# Patient Record
Sex: Female | Born: 1955
Health system: Southern US, Community
[De-identification: ages and names within clinical notes are randomized; demographics above are authoritative.]

## PROBLEM LIST (undated history)

## (undated) DIAGNOSIS — N184 Chronic kidney disease, stage 4 (severe): Secondary | ICD-10-CM

## (undated) DIAGNOSIS — I2510 Atherosclerotic heart disease of native coronary artery without angina pectoris: Secondary | ICD-10-CM

## (undated) DIAGNOSIS — I10 Essential (primary) hypertension: Secondary | ICD-10-CM

## (undated) DIAGNOSIS — I5040 Unspecified combined systolic (congestive) and diastolic (congestive) heart failure: Secondary | ICD-10-CM

## (undated) DIAGNOSIS — E119 Type 2 diabetes mellitus without complications: Secondary | ICD-10-CM

## (undated) DIAGNOSIS — E785 Hyperlipidemia, unspecified: Secondary | ICD-10-CM

## (undated) HISTORY — DX: Hyperlipidemia, unspecified: E78.5

## (undated) HISTORY — PX: CARDIAC SURGERY: SHX584

## (undated) HISTORY — PX: CORONARY ARTERY BYPASS GRAFT: SHX141

---

## 2004-09-10 ENCOUNTER — Encounter: Admission: RE | Admit: 2004-09-10 | Discharge: 2004-09-10 | Payer: Self-pay | Admitting: Internal Medicine

## 2004-11-12 ENCOUNTER — Inpatient Hospital Stay (HOSPITAL_COMMUNITY): Admission: AD | Admit: 2004-11-12 | Discharge: 2004-11-18 | Payer: Self-pay | Admitting: *Deleted

## 2017-10-20 ENCOUNTER — Emergency Department (HOSPITAL_COMMUNITY): Payer: 59

## 2017-10-20 ENCOUNTER — Ambulatory Visit (HOSPITAL_COMMUNITY)
Admission: EM | Admit: 2017-10-20 | Discharge: 2017-10-20 | Disposition: A | Discharge status: Home / Self Care | Payer: 59 | Source: Home / Self Care | Attending: Internal Medicine | Admitting: Internal Medicine

## 2017-10-20 ENCOUNTER — Inpatient Hospital Stay (HOSPITAL_COMMUNITY)
Admission: EM | Admit: 2017-10-20 | Discharge: 2017-10-23 | DRG: 872 | Disposition: A | Discharge status: Home / Self Care | Payer: 59 | Attending: Internal Medicine | Admitting: Internal Medicine

## 2017-10-20 ENCOUNTER — Encounter (HOSPITAL_COMMUNITY): Payer: Self-pay | Admitting: *Deleted

## 2017-10-20 ENCOUNTER — Encounter (HOSPITAL_COMMUNITY): Payer: Self-pay | Admitting: Emergency Medicine

## 2017-10-20 DIAGNOSIS — I160 Hypertensive urgency: Secondary | ICD-10-CM | POA: Diagnosis present

## 2017-10-20 DIAGNOSIS — Z951 Presence of aortocoronary bypass graft: Secondary | ICD-10-CM | POA: Diagnosis not present

## 2017-10-20 DIAGNOSIS — N189 Chronic kidney disease, unspecified: Secondary | ICD-10-CM | POA: Diagnosis present

## 2017-10-20 DIAGNOSIS — R748 Abnormal levels of other serum enzymes: Secondary | ICD-10-CM | POA: Diagnosis not present

## 2017-10-20 DIAGNOSIS — R05 Cough: Secondary | ICD-10-CM | POA: Diagnosis not present

## 2017-10-20 DIAGNOSIS — R079 Chest pain, unspecified: Secondary | ICD-10-CM | POA: Diagnosis not present

## 2017-10-20 DIAGNOSIS — E872 Acidosis: Secondary | ICD-10-CM | POA: Diagnosis present

## 2017-10-20 DIAGNOSIS — N390 Urinary tract infection, site not specified: Secondary | ICD-10-CM | POA: Diagnosis not present

## 2017-10-20 DIAGNOSIS — R1031 Right lower quadrant pain: Secondary | ICD-10-CM | POA: Diagnosis not present

## 2017-10-20 DIAGNOSIS — E785 Hyperlipidemia, unspecified: Secondary | ICD-10-CM | POA: Diagnosis present

## 2017-10-20 DIAGNOSIS — N179 Acute kidney failure, unspecified: Secondary | ICD-10-CM | POA: Diagnosis present

## 2017-10-20 DIAGNOSIS — R739 Hyperglycemia, unspecified: Secondary | ICD-10-CM | POA: Diagnosis not present

## 2017-10-20 DIAGNOSIS — I2510 Atherosclerotic heart disease of native coronary artery without angina pectoris: Secondary | ICD-10-CM | POA: Diagnosis present

## 2017-10-20 DIAGNOSIS — R7881 Bacteremia: Secondary | ICD-10-CM

## 2017-10-20 DIAGNOSIS — R7989 Other specified abnormal findings of blood chemistry: Secondary | ICD-10-CM | POA: Diagnosis not present

## 2017-10-20 DIAGNOSIS — R778 Other specified abnormalities of plasma proteins: Secondary | ICD-10-CM | POA: Diagnosis present

## 2017-10-20 DIAGNOSIS — E1165 Type 2 diabetes mellitus with hyperglycemia: Secondary | ICD-10-CM | POA: Diagnosis not present

## 2017-10-20 DIAGNOSIS — E118 Type 2 diabetes mellitus with unspecified complications: Secondary | ICD-10-CM

## 2017-10-20 DIAGNOSIS — E860 Dehydration: Secondary | ICD-10-CM | POA: Diagnosis present

## 2017-10-20 DIAGNOSIS — Z23 Encounter for immunization: Secondary | ICD-10-CM

## 2017-10-20 DIAGNOSIS — Z9119 Patient's noncompliance with other medical treatment and regimen: Secondary | ICD-10-CM | POA: Diagnosis not present

## 2017-10-20 DIAGNOSIS — E119 Type 2 diabetes mellitus without complications: Secondary | ICD-10-CM | POA: Diagnosis not present

## 2017-10-20 DIAGNOSIS — R000 Tachycardia, unspecified: Secondary | ICD-10-CM

## 2017-10-20 DIAGNOSIS — I5031 Acute diastolic (congestive) heart failure: Secondary | ICD-10-CM | POA: Diagnosis not present

## 2017-10-20 DIAGNOSIS — R110 Nausea: Secondary | ICD-10-CM | POA: Diagnosis not present

## 2017-10-20 DIAGNOSIS — Z7982 Long term (current) use of aspirin: Secondary | ICD-10-CM

## 2017-10-20 DIAGNOSIS — Z9114 Patient's other noncompliance with medication regimen: Secondary | ICD-10-CM | POA: Diagnosis not present

## 2017-10-20 DIAGNOSIS — A4159 Other Gram-negative sepsis: Principal | ICD-10-CM | POA: Diagnosis present

## 2017-10-20 DIAGNOSIS — N3000 Acute cystitis without hematuria: Secondary | ICD-10-CM | POA: Diagnosis not present

## 2017-10-20 DIAGNOSIS — I10 Essential (primary) hypertension: Secondary | ICD-10-CM | POA: Diagnosis present

## 2017-10-20 DIAGNOSIS — A419 Sepsis, unspecified organism: Secondary | ICD-10-CM | POA: Diagnosis present

## 2017-10-20 DIAGNOSIS — I248 Other forms of acute ischemic heart disease: Secondary | ICD-10-CM | POA: Diagnosis present

## 2017-10-20 DIAGNOSIS — E1169 Type 2 diabetes mellitus with other specified complication: Secondary | ICD-10-CM

## 2017-10-20 HISTORY — DX: Type 2 diabetes mellitus without complications: E11.9

## 2017-10-20 HISTORY — DX: Essential (primary) hypertension: I10

## 2017-10-20 HISTORY — DX: Atherosclerotic heart disease of native coronary artery without angina pectoris: I25.10

## 2017-10-20 LAB — CBC WITH DIFFERENTIAL/PLATELET
Basophils Absolute: 0 10*3/uL (ref 0.0–0.1)
Basophils Relative: 0 %
Eosinophils Absolute: 0 10*3/uL (ref 0.0–0.7)
Eosinophils Relative: 0 %
HCT: 40.8 % (ref 36.0–46.0)
Hemoglobin: 13.6 g/dL (ref 12.0–15.0)
Lymphocytes Relative: 8 %
Lymphs Abs: 0.9 10*3/uL (ref 0.7–4.0)
MCH: 29.2 pg (ref 26.0–34.0)
MCHC: 33.3 g/dL (ref 30.0–36.0)
MCV: 87.6 fL (ref 78.0–100.0)
Monocytes Absolute: 0.6 10*3/uL (ref 0.1–1.0)
Monocytes Relative: 5 %
Neutro Abs: 10.1 10*3/uL — ABNORMAL HIGH (ref 1.7–7.7)
Neutrophils Relative %: 87 %
Platelets: 104 10*3/uL — ABNORMAL LOW (ref 150–400)
RBC: 4.66 MIL/uL (ref 3.87–5.11)
RDW: 13.1 % (ref 11.5–15.5)
WBC: 11.6 10*3/uL — ABNORMAL HIGH (ref 4.0–10.5)

## 2017-10-20 LAB — COMPREHENSIVE METABOLIC PANEL
ALT: 19 U/L (ref 14–54)
AST: 16 U/L (ref 15–41)
Albumin: 2.6 g/dL — ABNORMAL LOW (ref 3.5–5.0)
Alkaline Phosphatase: 149 U/L — ABNORMAL HIGH (ref 38–126)
Anion gap: 13 (ref 5–15)
BUN: 74 mg/dL — ABNORMAL HIGH (ref 6–20)
CO2: 27 mmol/L (ref 22–32)
Calcium: 8.8 mg/dL — ABNORMAL LOW (ref 8.9–10.3)
Chloride: 91 mmol/L — ABNORMAL LOW (ref 101–111)
Creatinine, Ser: 1.97 mg/dL — ABNORMAL HIGH (ref 0.44–1.00)
GFR calc Af Amer: 30 mL/min — ABNORMAL LOW (ref >60)
GFR calc non Af Amer: 26 mL/min — ABNORMAL LOW (ref >60)
Glucose, Bld: 544 mg/dL (ref 65–99)
Potassium: 5.1 mmol/L (ref 3.5–5.1)
Sodium: 131 mmol/L — ABNORMAL LOW (ref 135–145)
Total Bilirubin: 1.2 mg/dL (ref 0.3–1.2)
Total Protein: 7.1 g/dL (ref 6.5–8.1)

## 2017-10-20 LAB — I-STAT CG4 LACTIC ACID, ED
Lactic Acid, Venous: 2.07 mmol/L (ref 0.5–1.9)
Lactic Acid, Venous: 2.18 mmol/L (ref 0.5–1.9)

## 2017-10-20 LAB — POCT URINALYSIS DIP (DEVICE)
Bilirubin Urine: NEGATIVE
Glucose, UA: 500 mg/dL — AB
Nitrite: NEGATIVE
Protein, ur: 100 mg/dL — AB
Specific Gravity, Urine: 1.015 (ref 1.005–1.030)
Urobilinogen, UA: 0.2 mg/dL (ref 0.0–1.0)
pH: 5 (ref 5.0–8.0)

## 2017-10-20 LAB — CREATININE, URINE, RANDOM: Creatinine, Urine: 52.3 mg/dL

## 2017-10-20 LAB — URINALYSIS, ROUTINE W REFLEX MICROSCOPIC
Bilirubin Urine: NEGATIVE
Glucose, UA: >=500 mg/dL — AB
Ketones, ur: 5 mg/dL — AB
Nitrite: NEGATIVE
Protein, ur: 30 mg/dL — AB
Specific Gravity, Urine: 1.018 (ref 1.005–1.030)
pH: 5 (ref 5.0–8.0)

## 2017-10-20 LAB — POCT I-STAT, CHEM 8
BUN: 61 mg/dL — ABNORMAL HIGH (ref 6–20)
Calcium, Ion: 1.07 mmol/L — ABNORMAL LOW (ref 1.15–1.40)
Chloride: 96 mmol/L — ABNORMAL LOW (ref 101–111)
Creatinine, Ser: 1.9 mg/dL — ABNORMAL HIGH (ref 0.44–1.00)
Glucose, Bld: 535 mg/dL (ref 65–99)
HCT: 44 % (ref 36.0–46.0)
Hemoglobin: 15 g/dL (ref 12.0–15.0)
Potassium: 5.1 mmol/L (ref 3.5–5.1)
Sodium: 133 mmol/L — ABNORMAL LOW (ref 135–145)
TCO2: 28 mmol/L (ref 22–32)

## 2017-10-20 LAB — BRAIN NATRIURETIC PEPTIDE: B Natriuretic Peptide: 143.3 pg/mL — ABNORMAL HIGH (ref 0.0–100.0)

## 2017-10-20 LAB — RAPID URINE DRUG SCREEN, HOSP PERFORMED
AMPHETAMINES: NOT DETECTED
BARBITURATES: NOT DETECTED
BENZODIAZEPINES: NOT DETECTED
Cocaine: NOT DETECTED
Opiates: NOT DETECTED
TETRAHYDROCANNABINOL: NOT DETECTED

## 2017-10-20 LAB — CBG MONITORING, ED
GLUCOSE-CAPILLARY: 421 mg/dL — AB (ref 65–99)
Glucose-Capillary: 378 mg/dL — ABNORMAL HIGH (ref 65–99)

## 2017-10-20 LAB — PROTIME-INR
INR: 0.92
PROTHROMBIN TIME: 12.2 s (ref 11.4–15.2)

## 2017-10-20 LAB — PROCALCITONIN: PROCALCITONIN: 10.86 ng/mL

## 2017-10-20 LAB — LIPASE, BLOOD: Lipase: 33 U/L (ref 11–51)

## 2017-10-20 LAB — I-STAT TROPONIN, ED: Troponin i, poc: 0.09 ng/mL (ref 0.00–0.08)

## 2017-10-20 LAB — TROPONIN I: TROPONIN I: 0.11 ng/mL — AB (ref <0.03)

## 2017-10-20 LAB — LACTIC ACID, PLASMA: Lactic Acid, Venous: 2.1 mmol/L (ref 0.5–1.9)

## 2017-10-20 LAB — SODIUM, URINE, RANDOM: Sodium, Ur: 12 mmol/L

## 2017-10-20 MED ORDER — ZOLPIDEM TARTRATE 5 MG PO TABS: 5.0000 mg | ORAL_TABLET | Freq: Every evening | ORAL | Status: DC | PRN

## 2017-10-20 MED ORDER — ASPIRIN EC 325 MG PO TBEC
325.0000 mg | DELAYED_RELEASE_TABLET | Freq: Every day | ORAL | Status: DC
  Administered 2017-10-20 – 2017-10-23 (×4): 325 mg via ORAL
  Filled 2017-10-20 (×4): qty 1

## 2017-10-20 MED ORDER — ACETAMINOPHEN 650 MG RE SUPP: 650.0000 mg | Freq: Four times a day (QID) | RECTAL | Status: DC | PRN

## 2017-10-20 MED ORDER — DEXTROSE 5 % IV SOLN
2.0000 g | Freq: Once | INTRAVENOUS | Status: AC
  Administered 2017-10-20: 2 g via INTRAVENOUS
  Filled 2017-10-20: qty 2

## 2017-10-20 MED ORDER — INSULIN ASPART 100 UNIT/ML ~~LOC~~ SOLN
7.0000 [IU] | Freq: Once | SUBCUTANEOUS | Status: AC
  Administered 2017-10-20: 7 [IU] via INTRAVENOUS
  Filled 2017-10-20: qty 1

## 2017-10-20 MED ORDER — DEXTROSE 5 % IV SOLN: 1.0000 g | INTRAVENOUS | Status: DC

## 2017-10-20 MED ORDER — MORPHINE SULFATE (PF) 4 MG/ML IV SOLN: 2.0000 mg | INTRAVENOUS | Status: DC | PRN

## 2017-10-20 MED ORDER — INSULIN ASPART 100 UNIT/ML ~~LOC~~ SOLN
0.0000 [IU] | Freq: Three times a day (TID) | SUBCUTANEOUS | Status: DC
  Administered 2017-10-21 (×2): 7 [IU] via SUBCUTANEOUS
  Administered 2017-10-22: 3 [IU] via SUBCUTANEOUS
  Administered 2017-10-22: 7 [IU] via SUBCUTANEOUS
  Administered 2017-10-22: 3 [IU] via SUBCUTANEOUS
  Administered 2017-10-23: 2 [IU] via SUBCUTANEOUS
  Filled 2017-10-20 (×2): qty 1

## 2017-10-20 MED ORDER — ENOXAPARIN SODIUM 40 MG/0.4ML ~~LOC~~ SOLN
40.0000 mg | Freq: Every day | SUBCUTANEOUS | Status: DC
  Administered 2017-10-21 – 2017-10-23 (×3): 40 mg via SUBCUTANEOUS
  Filled 2017-10-20 (×5): qty 0.4

## 2017-10-20 MED ORDER — NITROGLYCERIN 0.4 MG SL SUBL: 0.4000 mg | SUBLINGUAL_TABLET | SUBLINGUAL | Status: DC | PRN

## 2017-10-20 MED ORDER — HYDRALAZINE HCL 20 MG/ML IJ SOLN: 5.0000 mg | INTRAMUSCULAR | Status: DC | PRN

## 2017-10-20 MED ORDER — ACETAMINOPHEN 325 MG PO TABS
650.0000 mg | ORAL_TABLET | Freq: Four times a day (QID) | ORAL | Status: DC | PRN
  Administered 2017-10-22: 650 mg via ORAL
  Filled 2017-10-20: qty 2

## 2017-10-20 MED ORDER — SODIUM CHLORIDE 0.9 % IV BOLUS (SEPSIS)
1000.0000 mL | Freq: Once | INTRAVENOUS | Status: AC
  Administered 2017-10-20: 1000 mL via INTRAVENOUS

## 2017-10-20 MED ORDER — SODIUM CHLORIDE 0.9 % IV BOLUS (SEPSIS)
500.0000 mL | Freq: Once | INTRAVENOUS | Status: AC
  Administered 2017-10-20: 500 mL via INTRAVENOUS

## 2017-10-20 MED ORDER — AMLODIPINE BESYLATE 5 MG PO TABS
5.0000 mg | ORAL_TABLET | Freq: Every day | ORAL | Status: DC
  Administered 2017-10-20 – 2017-10-22 (×3): 5 mg via ORAL
  Filled 2017-10-20 (×3): qty 1

## 2017-10-20 MED ORDER — ATORVASTATIN CALCIUM 40 MG PO TABS
40.0000 mg | ORAL_TABLET | Freq: Every day | ORAL | Status: DC
  Administered 2017-10-20 – 2017-10-22 (×3): 40 mg via ORAL
  Filled 2017-10-20 (×3): qty 1

## 2017-10-20 MED ORDER — INSULIN GLARGINE 100 UNIT/ML ~~LOC~~ SOLN
5.0000 [IU] | Freq: Every day | SUBCUTANEOUS | Status: DC
  Administered 2017-10-20 – 2017-10-21 (×2): 5 [IU] via SUBCUTANEOUS
  Filled 2017-10-20 (×3): qty 0.05

## 2017-10-20 MED ORDER — SODIUM CHLORIDE 0.9 % IV SOLN
INTRAVENOUS | Status: DC
  Administered 2017-10-20 – 2017-10-23 (×3): via INTRAVENOUS

## 2017-10-20 MED ORDER — ONDANSETRON HCL 4 MG/2ML IJ SOLN: 4.0000 mg | Freq: Three times a day (TID) | INTRAMUSCULAR | Status: DC | PRN

## 2017-10-20 NOTE — H&P (Signed)
History and Physical    Beverly Martin HYI:[id number] DOB: [birth year] DOA: 10/20/2017  Referring MD/NP/PA:   PCP: Patient, No Pcp Per   Patient coming from:  The patient is coming from home.  At baseline, pt is independent for most of ADL.   Chief Complaint: Nausea, R flank pain, increased urinary frequency  HPI: Beverly Martin is a 62 y.o. female with medical history significant of hypertension, diabetes mellitus, CAD, s/p of CABG, medication noncompliance, who presents with nausea, right flank pain, increased urinary frequency.  Patient states that she has nausea and poor appetite in the past for several days. She has generalized weakness, but no unilateral weakness, numbness or tingling to extremities. Patient reports increased urinary frequency, but no dysuria or burning on urination. She she had 2 loose stool bowel movements yesterday, which has resolved. Currently patient does not have diarrhea. She has dry heaving, no vomiting. She states that she had mild pain in the right flank and the right abdomen earlier, which has resolved. Currently no abdominal pain. Patient denies chest pain and shortness breath. She states that she had mild dry cough yesterday, which has resolved. No runny nose or sore throat. Denies fever or chills. Patient states that she did not take medications for diabetes and blood pressure in the past 3 years since she lost job and were lack of insurance. She states that she recently got a job and has insurance now.  ED Course: pt was found to have WBC 11.6, lactic acid is 2.18, positive urinalysis with small amount of leukocyte, elevated troponin 0.09, BNP 143, acute renal injury with creatinine 1.97, BUN 74, temperature normal, tachycardia, tachypnea, oxygen saturation 92% on room air, elevated blood pressure 186/89. Chest x-ray showed mild interstitial edema and pleural effusion. Pt is admitted to telemetry bed as inpatient.  Review of Systems:   General: no fevers,  chills, no body weight gain, has poor appetite, has fatigue HEENT: no blurry vision, hearing changes or sore throat Respiratory: no dyspnea, had coughing, no wheezing CV: no chest pain, no palpitations GI: has nausea, dry heaving, currently no abdominal pain, diarrhea, constipation GU: no dysuria, burning on urination, has increased urinary frequency, no hematuria  Ext: no leg edema Neuro: no unilateral weakness, numbness, or tingling, no vision change or hearing loss Skin: no rash, no skin tear. MSK: No muscle spasm, no deformity, no limitation of range of movement in spin Heme: No easy bruising.  Travel history: No recent long distant travel.  Allergy: No Known Allergies  Past Medical History:  Diagnosis Date  . CAD (coronary artery disease)   . Diabetes mellitus without complication ([place])   . Essential hypertension     Past Surgical History:  Procedure Laterality Date  . CARDIAC SURGERY     CABG    Social History:  reports that  has never smoked. she has never used smokeless tobacco. She reports that she does not drink alcohol or use drugs.  Family History:  Family History  Problem Relation Age of Onset  . Diabetes Mellitus II Mother   . Esophageal cancer Father      Prior to Admission medications   Medication Sig Start Date End Date Taking? Authorizing Provider  aspirin EC 81 MG tablet Take 81 mg by mouth daily.   Yes [provider]  DM-Doxylamine-Acetaminophen (NYQUIL COLD & FLU PO) Take 2 capsules by mouth as needed (cold symptoms).    Yes [provider]    Physical Exam: Vitals:  10/20/17 1815 10/20/17 1819 10/20/17 1830 10/20/17 1845  BP: (!) 186/89 (!) 186/89 (!) 173/83 (!) 174/99  Pulse: (!) 106 (!) 106 100 (!) 108  Resp: (!) 26 (!) 23 (!) 21 (!) 23  Temp:  98.6 F (37 C)    TempSrc:  Oral    SpO2: 94% 93% 93% 93%   General: Not in acute distress. Dry mucus and membrane HEENT:       Eyes: PERRL, EOMI, no scleral icterus.        ENT: No discharge from the ears and nose, no pharynx injection, no tonsillar enlargement.        Neck: No JVD, no bruit, no mass felt. Heme: No neck lymph node enlargement. Cardiac: S1/S2, RRR, No murmurs, No gallops or rubs. Respiratory: No rales, wheezing, rhonchi or rubs. GI: Soft, nondistended, nontender, no rebound pain, no organomegaly, BS present. GU: No hematuria Ext: No pitting leg edema bilaterally. 2+DP/PT pulse bilaterally. Musculoskeletal: No joint deformities, No joint redness or warmth, no limitation of ROM in spin. Skin: No rashes.  Neuro: Alert, oriented X3, cranial nerves II-XII grossly intact, moves all extremities normally. Psych: Patient is not psychotic, no suicidal or hemocidal ideation.  Labs on Admission: I have personally reviewed following labs and imaging studies  CBC: Recent Labs  Lab 10/20/17 1135 10/20/17 1301  WBC  --  11.6*  NEUTROABS  --  10.1*  HGB 15.0 13.6  HCT 44.0 40.8  MCV  --  87.6  PLT  --  240*   Basic Metabolic Panel: Recent Labs  Lab 10/20/17 1135 10/20/17 1301  NA 133* 131*  K 5.1 5.1  CL 96* 91*  CO2  --  27  GLUCOSE 535* 544*  BUN 61* 74*  CREATININE 1.90* 1.97*  CALCIUM  --  8.8*   GFR: CrCl cannot be calculated (Unknown ideal weight.). Liver Function Tests: Recent Labs  Lab 10/20/17 1301  AST 16  ALT 19  ALKPHOS 149*  BILITOT 1.2  PROT 7.1  ALBUMIN 2.6*   No results for input(s): LIPASE, AMYLASE in the last 168 hours. No results for input(s): AMMONIA in the last 168 hours. Coagulation Profile: No results for input(s): INR, PROTIME in the last 168 hours. Cardiac Enzymes: No results for input(s): CKTOTAL, CKMB, CKMBINDEX, TROPONINI in the last 168 hours. BNP (last 3 results) No results for input(s): PROBNP in the last 8760 hours. HbA1C: No results for input(s): HGBA1C in the last 72 hours. CBG: No results for input(s): GLUCAP in the last 168 hours. Lipid Profile: No results for input(s): CHOL, HDL,  LDLCALC, TRIG, CHOLHDL, LDLDIRECT in the last 72 hours. Thyroid Function Tests: No results for input(s): TSH, T4TOTAL, FREET4, T3FREE, THYROIDAB in the last 72 hours. Anemia Panel: No results for input(s): VITAMINB12, FOLATE, FERRITIN, TIBC, IRON, RETICCTPCT in the last 72 hours. Urine analysis:    Component Value Date/Time   COLORURINE YELLOW 10/20/2017 1714   APPEARANCEUR CLOUDY (A) 10/20/2017 1714   LABSPEC 1.018 10/20/2017 1714   PHURINE 5.0 10/20/2017 1714   GLUCOSEU >=500 (A) 10/20/2017 1714   HGBUR MODERATE (A) 10/20/2017 1714   BILIRUBINUR NEGATIVE 10/20/2017 1714   KETONESUR 5 (A) 10/20/2017 1714   PROTEINUR 30 (A) 10/20/2017 1714   UROBILINOGEN 0.2 10/20/2017 1115   NITRITE NEGATIVE 10/20/2017 1714   LEUKOCYTESUR MODERATE (A) 10/20/2017 1714   Sepsis Labs: @LABRCNTIP (procalcitonin:4,lacticidven:4) )No results found for this or any previous visit (from the past 240 hour(s)).   Radiological Exams on Admission: Dg Chest 2 View  Result Date: 10/20/2017 CLINICAL DATA:  Fever and cough. EXAM: CHEST  2 VIEW COMPARISON:  2/4/6 FINDINGS: Previous median sternotomy and CABG procedure. Small bilateral pleural effusions and mild interstitial edema identified. No pneumothorax identified. IMPRESSION: 1. Mild pulmonary edema and pleural effusions. 2. No pneumothorax after removal of chest tube. Electronically Signed   By: Kerby Moors M.D.   On: 10/20/2017 13:50     EKG: Independently reviewed.  Sinus rhythm, QTC 392, mild ST depression in inferior leads, mild T-wave inversion in V5 to V6.   Assessment/Plan Principal Problem:   UTI (urinary tract infection) Active Problems:   Essential hypertension   Diabetes mellitus with complication (HCC)   CAD (coronary artery disease)   Sepsis (HCC)   AKI (acute kidney injury) ([place])   Elevated troponin   Sepsis due to UTI (urinary tract infection): Patient meets criteria for sepsis with leukocytosis, tachycardia and tachypnea. Lactic  acid is elevated at 2.18, currently hemodynamically stable. Pt reports R flank and right abdominal pain which has resolved, making pyelonephritis potential differential diagnosis, but pt dose not have CVA tenderness, therefore pyelonephritis is less likely.  -  Admit to telemetry bed as inpt -  Ceftriaxone by IV - Follow up results of urine and blood cx and amend antibiotic regimen if needed per sensitivity results - prn Zofran for nausea - will get Procalcitonin and trend lactic acid levels per sepsis protocol. -  IVF: 1.5L of NS bolus in ED, followed by 100 cc/h   Essential hypertension: not taking meds for 3 years. Bp 186/89 -start amlodipine 5 mg daily now -IV hydralazine when necessary  Diabetes mellitus with cardiovascular complication ([place]):  not taking meds for 3 years. No A1c data available. Blood sugar 544, with normal AG, No DKA. Mental status normal. -start lantus 5 u daily -SSI -f/u A1c  Elevated trop and hx of CAD: s/p of CABG. Trop 0.09. Patient denies any chest pain. Likely due to demand ischemia secondary to sepsis - cycle CE q6 x3 and repeat EKG in the am  - prn Nitroglycerin, Morphine, and aspirin - start lipitor  - Risk factor stratification: will check FLP and A1C  - 2d echo - Inpatient non-urgent card consult order was put in Epic and message to [employer] was sent out.  AKI: Cre 1.97 and BUN 74. No baseline Cre available, assume that pt has AKI. Likely due to prerenal secondary to dehydration. - IVF as above - Check FeNa  - Follow up renal function by BMP  DVT ppx: SQ Lovenox Code Status: Full code Family Communication:  Yes, patient's husband at bed side Disposition Plan:  Anticipate discharge back to previous home environment Consults called:  none Admission status:  Inpatient/tele     Date of Service 10/20/2017    Ivor Costa [facility] Pager [phone]  If 7PM-7AM, please contact night-coverage [email or web address] Password [facility] 10/20/2017, 8:04  PM

## 2017-10-20 NOTE — Discharge Instructions (Signed)
Your blood work showed you blood sugar is very high,  with dehydration, you will need more workup than I can provide here.  Please go to the emergency department for further evaluation and treatment.

## 2017-10-20 NOTE — ED Provider Notes (Signed)
[facility] EMERGENCY DEPARTMENT Provider Note   CSN: [id number] Arrival date & time: 10/20/17  1153     History   Chief Complaint Chief Complaint  Patient presents with  . Hyperglycemia    HPI Beverly Martin is a 62 y.o. female.  HPI   Loss of appetite since Friday, but still drinking a lot of fluids Urinary frequency, no dysuria. Has had right flank pain, mild, comes and goes. Nausea, no vomiting No diarrhea or constipation. Slight lower abdominal pain Had slight fever at urgent care today  No chest pain, very mild dyspnea at first but didn't linger, none today Mild cough today Was a Diabetic and when lost job, lost medication, has been off of medication for years now Has hx of CABG in 2006  Does note orthopnea, PND also since Friday, some doe    Past Medical History:  Diagnosis Date  . CAD (coronary artery disease)   . Diabetes mellitus without complication ([place])   . Essential hypertension     Patient Active Problem List   Diagnosis Date Noted  . UTI (urinary tract infection) 10/20/2017  . Sepsis ([place]) 10/20/2017  . AKI (acute kidney injury) ([place]) 10/20/2017  . Elevated troponin 10/20/2017  . Essential hypertension   . Diabetes mellitus with complication ([place])   . CAD (coronary artery disease)     Past Surgical History:  Procedure Laterality Date  . CARDIAC SURGERY     CABG    OB History    No data available       Home Medications    Prior to Admission medications   Medication Sig Start Date End Date Taking? Authorizing Provider  aspirin EC 81 MG tablet Take 81 mg by mouth daily.   Yes [provider]  DM-Doxylamine-Acetaminophen (NYQUIL COLD & FLU PO) Take 2 capsules by mouth as needed (cold symptoms).    Yes [provider]    Family History Family History  Problem Relation Age of Onset  . Diabetes Mellitus II Mother   . Esophageal cancer Father     Social History Social History   Tobacco Use    . Smoking status: Never Smoker  . Smokeless tobacco: Never Used  Substance Use Topics  . Alcohol use: No    Frequency: Never  . Drug use: No     Allergies   Patient has no known allergies.   Review of Systems Review of Systems  Constitutional: Positive for appetite change and fatigue. Negative for fever.  HENT: Negative for sore throat.   Eyes: Negative for visual disturbance.  Respiratory: Negative for cough and shortness of breath (not today).   Cardiovascular: Positive for leg swelling. Negative for chest pain.  Gastrointestinal: Positive for abdominal pain and nausea. Negative for constipation, diarrhea and vomiting.  Endocrine: Positive for polyuria.  Genitourinary: Positive for flank pain and frequency. Negative for difficulty urinating.  Musculoskeletal: Negative for back pain and neck pain.  Skin: Negative for rash.  Neurological: Negative for syncope and headaches.     Physical Exam Updated Vital Signs BP 140/67   Pulse (!) 111   Temp 98.6 F (37 C) (Oral)   Resp 20   SpO2 95%   Physical Exam  Constitutional: She is oriented to person, place, and time. She appears well-developed and well-nourished. No distress.  HENT:  Head: Normocephalic and atraumatic.  Eyes: Conjunctivae and EOM are normal.  Neck: Normal range of motion. No JVD present.  Cardiovascular: Normal rate, regular rhythm, normal  heart sounds and intact distal pulses. Exam reveals no gallop and no friction rub.  No murmur heard. Pulmonary/Chest: Effort normal and breath sounds normal. No respiratory distress. She has no wheezes. She has no rales.  Abdominal: Soft. She exhibits no distension. There is no tenderness. There is no guarding.  Musculoskeletal: She exhibits no edema or tenderness.  Neurological: She is alert and oriented to person, place, and time.  Skin: Skin is warm and dry. No rash noted. She is not diaphoretic. No erythema.  Nursing note and vitals reviewed.    ED Treatments  / Results  Labs (all labs ordered are listed, but only abnormal results are displayed) Labs Reviewed  COMPREHENSIVE METABOLIC PANEL - Abnormal; Notable for the following components:      Result Value   Sodium 131 (*)    Chloride 91 (*)    Glucose, Bld 544 (*)    BUN 74 (*)    Creatinine, Ser 1.97 (*)    Calcium 8.8 (*)    Albumin 2.6 (*)    Alkaline Phosphatase 149 (*)    GFR calc non Af Amer 26 (*)    GFR calc Af Amer 30 (*)    All other components within normal limits  CBC WITH DIFFERENTIAL/PLATELET - Abnormal; Notable for the following components:   WBC 11.6 (*)    Platelets 104 (*)    Neutro Abs 10.1 (*)    All other components within normal limits  URINALYSIS, ROUTINE W REFLEX MICROSCOPIC - Abnormal; Notable for the following components:   APPearance CLOUDY (*)    Glucose, UA >=500 (*)    Hgb urine dipstick MODERATE (*)    Ketones, ur 5 (*)    Protein, ur 30 (*)    Leukocytes, UA MODERATE (*)    Bacteria, UA MANY (*)    Squamous Epithelial / LPF 0-5 (*)    All other components within normal limits  BRAIN NATRIURETIC PEPTIDE - Abnormal; Notable for the following components:   B Natriuretic Peptide 143.3 (*)    All other components within normal limits  TROPONIN I - Abnormal; Notable for the following components:   Troponin I 0.11 (*)    All other components within normal limits  LACTIC ACID, PLASMA - Abnormal; Notable for the following components:   Lactic Acid, Venous 2.1 (*)    All other components within normal limits  I-STAT CG4 LACTIC ACID, ED - Abnormal; Notable for the following components:   Lactic Acid, Venous 2.07 (*)    All other components within normal limits  I-STAT CG4 LACTIC ACID, ED - Abnormal; Notable for the following components:   Lactic Acid, Venous 2.18 (*)    All other components within normal limits  I-STAT TROPONIN, ED - Abnormal; Notable for the following components:   Troponin i, poc 0.09 (*)    All other components within normal limits   CBG MONITORING, ED - Abnormal; Notable for the following components:   Glucose-Capillary 421 (*)    All other components within normal limits  CBG MONITORING, ED - Abnormal; Notable for the following components:   Glucose-Capillary 378 (*)    All other components within normal limits  CULTURE, BLOOD (ROUTINE X 2)  CULTURE, BLOOD (ROUTINE X 2)  URINE CULTURE  LIPASE, BLOOD  LACTIC ACID, PLASMA  PROCALCITONIN  PROTIME-INR  CREATININE, URINE, RANDOM  SODIUM, URINE, RANDOM  RAPID URINE DRUG SCREEN, HOSP PERFORMED  HEMOGLOBIN A1C  LIPID PANEL  TROPONIN I  TROPONIN I  HIV ANTIBODY (  ROUTINE TESTING)  BASIC METABOLIC PANEL  CBC    EKG  EKG Interpretation  Date/Time:  Tuesday October 20 2017 18:17:04 EST Ventricular Rate:  105 PR Interval:    QRS Duration: 80 QT Interval:  296 QTC Calculation: 392 R Axis:   75 Text Interpretation:  Sinus tachycardia LAE, consider biatrial enlargement Probable LVH with secondary repol abnrm Since prior ECG, diffuse repolarization abnormality is new, rate has increased Confirmed by Gareth Morgan [phone]) on 10/21/2017 1:42:36 AM       Radiology Dg Chest 2 View  Result Date: 10/20/2017 CLINICAL DATA:  Fever and cough. EXAM: CHEST  2 VIEW COMPARISON:  2/4/6 FINDINGS: Previous median sternotomy and CABG procedure. Small bilateral pleural effusions and mild interstitial edema identified. No pneumothorax identified. IMPRESSION: 1. Mild pulmonary edema and pleural effusions. 2. No pneumothorax after removal of chest tube. Electronically Signed   By: Kerby Moors M.D.   On: 10/20/2017 13:50    Procedures Procedures (including critical care time)  Medications Ordered in ED Medications  aspirin EC tablet 325 mg (325 mg Oral Given 10/20/17 2227)  atorvastatin (LIPITOR) tablet 40 mg (40 mg Oral Given 10/20/17 2335)  nitroGLYCERIN (NITROSTAT) SL tablet 0.4 mg (not administered)  morphine 4 MG/ML injection 2 mg (not administered)  cefTRIAXone (ROCEPHIN)  1 g in dextrose 5 % 50 mL IVPB (not administered)  amLODipine (NORVASC) tablet 5 mg (5 mg Oral Given 10/20/17 2227)  hydrALAZINE (APRESOLINE) injection 5 mg (not administered)  ondansetron (ZOFRAN) injection 4 mg (not administered)  0.9 %  sodium chloride infusion ( Intravenous New Bag/Given 10/20/17 2051)  insulin glargine (LANTUS) injection 5 Units (5 Units Subcutaneous Given 10/20/17 2140)  insulin aspart (novoLOG) injection 0-9 Units (not administered)  enoxaparin (LOVENOX) injection 40 mg (not administered)  acetaminophen (TYLENOL) tablet 650 mg (not administered)    Or  acetaminophen (TYLENOL) suppository 650 mg (not administered)  zolpidem (AMBIEN) tablet 5 mg (not administered)  insulin aspart (novoLOG) injection 7 Units (7 Units Intravenous Given 10/20/17 1653)  cefTRIAXone (ROCEPHIN) 2 g in dextrose 5 % 50 mL IVPB (0 g Intravenous Stopped 10/20/17 2141)  sodium chloride 0.9 % bolus 500 mL (0 mLs Intravenous Stopped 10/20/17 2141)  sodium chloride 0.9 % bolus 1,000 mL (0 mLs Intravenous Stopped 10/20/17 2222)     Initial Impression / Assessment and Plan / ED Course  I have reviewed the triage vital signs and the nursing notes.  Pertinent labs & imaging results that were available during my care of the patient were reviewed by me and considered in my medical decision making (see chart for details).     62 year old female with a history of CABG, diabetes not on medications, presents with concern for nausea, right-sided flank and suprapubic pain, polyuria, increased thirst, and fatigue.  Patient also noted orthopnea, paroxysmal nocturnal dyspnea.  Labs are significant for hyperglycemia without signs of DKA.  Chest x-ray concerning for mild pulmonary edema and pleural effusions.  Labs are also significant for elevated creatinine, with unknown baseline, slightly elevated lactic acid, mild leukocytosis, and urinalysis concerning for urinary tract infection.  Overall, suspect UTI as etiology of  symptoms, combined with uncontrolled hyperglycemia.  Suspect she may have some underlying cardiomyopathy given chest x-ray findings.  Troponin with mild elevation, likely secondary to strain in setting of acute illness, or possible congestive heart failure.  Overall, she appears intravascularly dry on exam secondary to hyperglycemia.  Given 500 cc of normal saline and insulin.  Blood cultures done and Rocephin given  for concern of urinary tract infection.  Will admit for continued care.  Final Clinical Impressions(s) / ED Diagnoses   Final diagnoses:  Hyperglycemia  Urinary tract infection without hematuria, site unspecified  Elevated troponin    ED Discharge Orders    None       Gareth Morgan, MD 10/21/17 [phone]

## 2017-10-20 NOTE — ED Provider Notes (Signed)
Beverly Martin    CSN: [id number] Arrival date & time: 10/20/17  1002     History   Chief Complaint Chief Complaint  Patient presents with  . Abdominal Pain    HPI Beverly Martin is a 62 y.o. female.   62 year old female with history of diabetes comes in for 5-day history of abdominal pain, nausea with dry heaving. Diarrhea started yesterday, about 2 episodes, denies blood in stool. Denies vomiting. States subjective fever. RLQ pain, pounding pain that is intermittent. States laying on the side makes the symptoms worse, states theraflu help with the symptoms. Endorses urinary frequency, urgency, denies dysuria, hematuria. Denies vaginal symptoms such as discharge, itching, spotting. Menopause at 62 years old.   History of DM, history of taking medications, but stopped more than 5 years ago after losing her job.       Past Medical History:  Diagnosis Date  . CAD (coronary artery disease)   . Diabetes mellitus without complication ([facility])   . Essential hypertension     Patient Active Problem List   Diagnosis Date Noted  . UTI (urinary tract infection) 10/20/2017  . Sepsis ([facility]) 10/20/2017  . AKI (acute kidney injury) ([place]) 10/20/2017  . Elevated troponin 10/20/2017  . Essential hypertension   . Diabetes mellitus with complication ([place])   . CAD (coronary artery disease)     Past Surgical History:  Procedure Laterality Date  . CARDIAC SURGERY     CABG    OB History    No data available       Home Medications    Prior to Admission medications   Medication Sig Start Date End Date Taking? Authorizing Provider  aspirin EC 81 MG tablet Take 81 mg by mouth daily.    [provider]  DM-Doxylamine-Acetaminophen (NYQUIL COLD & FLU PO) Take 2 capsules by mouth as needed (cold symptoms).     [provider]    Family History Family History  Problem Relation Age of Onset  . Diabetes Mellitus II Mother   . Esophageal cancer Father      Social History Social History   Tobacco Use  . Smoking status: Never Smoker  . Smokeless tobacco: Never Used  Substance Use Topics  . Alcohol use: No    Frequency: Never  . Drug use: No     Allergies   Patient has no known allergies.   Review of Systems Review of Systems  Reason unable to perform ROS: See HPI as above.     Physical Exam Triage Vital Signs ED Triage Vitals  Enc Vitals Group     BP 10/20/17 1032 (!) 160/85     Pulse Rate 10/20/17 1032 (!) 120     Resp 10/20/17 1032 16     Temp 10/20/17 1032 98.3 F (36.8 C)     Temp Source 10/20/17 1032 Oral     SpO2 10/20/17 1032 100 %     Weight 10/20/17 1035 155 lb (70.3 kg)     Height --      Head Circumference --      Peak Flow --      Pain Score 10/20/17 1035 4     Pain Loc --      Pain Edu? --      Excl. in [place]? --    Orthostatic VS for the past 24 hrs:  BP- Lying Pulse- Lying BP- Sitting Pulse- Sitting BP- Standing at 0 minutes Pulse- Standing at 0 minutes  10/20/17 1120 (!)  161/93 113 151/78 113 129/69 123    Updated Vital Signs BP (!) 160/85 (BP Location: Left Arm)   Pulse (!) 120   Temp 98.3 F (36.8 C) (Oral)   Resp 16   Wt 155 lb (70.3 kg)   SpO2 100%   Physical Exam  Constitutional: She is oriented to person, place, and time. She appears well-developed and well-nourished. No distress.  HENT:  Head: Normocephalic and atraumatic.  Eyes: Conjunctivae are normal. Pupils are equal, round, and reactive to light.  Cardiovascular: Regular rhythm and normal heart sounds. Tachycardia present. Exam reveals no gallop and no friction rub.  No murmur heard. Pulmonary/Chest: Effort normal and breath sounds normal. She has no wheezes. She has no rales.  Abdominal: Soft. Bowel sounds are normal. She exhibits no mass. There is tenderness (mild RLQ pain without guarding or rebound). There is CVA tenderness (right). There is no rebound and no guarding.  Negative psoas, obturator, Rovsing sign.   Neurological: She is alert and oriented to person, place, and time.  Skin: Skin is warm and dry.  Psychiatric: She has a normal mood and affect. Her behavior is normal. Judgment normal.     UC Treatments / Results  Labs (all labs ordered are listed, but only abnormal results are displayed) Labs Reviewed  POCT URINALYSIS DIP (DEVICE) - Abnormal; Notable for the following components:      Result Value   Glucose, UA 500 (*)    Ketones, ur TRACE (*)    Hgb urine dipstick MODERATE (*)    Protein, ur 100 (*)    Leukocytes, UA TRACE (*)    All other components within normal limits  POCT I-STAT, CHEM 8 - Abnormal; Notable for the following components:   Sodium 133 (*)    Chloride 96 (*)    BUN 61 (*)    Creatinine, Ser 1.90 (*)    Glucose, Bld 535 (*)    Calcium, Ion 1.07 (*)    All other components within normal limits    EKG  EKG Interpretation None       Radiology Dg Chest 2 View  Result Date: 10/20/2017 CLINICAL DATA:  Fever and cough. EXAM: CHEST  2 VIEW COMPARISON:  2/4/6 FINDINGS: Previous median sternotomy and CABG procedure. Small bilateral pleural effusions and mild interstitial edema identified. No pneumothorax identified. IMPRESSION: 1. Mild pulmonary edema and pleural effusions. 2. No pneumothorax after removal of chest tube. Electronically Signed   By: Kerby Moors M.D.   On: 10/20/2017 13:50    Procedures Procedures (including critical care time)  Medications Ordered in UC Medications - No data to display   Initial Impression / Assessment and Plan / UC Course  I have reviewed the triage vital signs and the nursing notes.  Pertinent labs & imaging results that were available during my care of the patient were reviewed by me and considered in my medical decision making (see chart for details).    62 year old female with history of diabetes for 3-4-day history of right lower quadrant abdominal pain, nausea.  Patient has not been on diabetes medicine for at  least the past 5 years.  She was found to be tachycardic, have right CVA tenderness, right lower quadrant pain on exam without guarding or rebound.  Positive orthostatics.  I-STAT showed serum glucose at 535, creatinine of 1.9.  Urine showed moderate hemoglobin with trace leukocytes.  Given history and exam, discussed with patient will need further evaluation and treatment.  Patient discharged in stable condition  to the emergency department for further evaluation.  Patient expresses understanding and agrees to plan.   Final Clinical Impressions(s) / UC Diagnoses   Final diagnoses:  Hyperglycemia  Right lower quadrant abdominal pain  Tachycardia    ED Discharge Orders    None         Arturo Morton 10/20/17 2051

## 2017-10-20 NOTE — ED Notes (Signed)
Gave pt water 

## 2017-10-20 NOTE — ED Notes (Signed)
Admitting MD aware Trop 0.11 and Lactic 2.1

## 2017-10-20 NOTE — ED Triage Notes (Signed)
Pt also reports recent fever and cough

## 2017-10-20 NOTE — ED Triage Notes (Signed)
PT reports abdominal pain, nausea, and loose stools. PT reports dry heaving as well. Symptoms started Friday.

## 2017-10-20 NOTE — ED Notes (Signed)
Informed Chrissie Noa - RN of pt's HR.

## 2017-10-20 NOTE — ED Triage Notes (Signed)
Pt sent from urgent are for hyperglycemia, history of diabetes and has been off medication for several years, went to urgent are c/o generalized fatigue, alert and oriented no distress noted

## 2017-10-21 ENCOUNTER — Encounter (HOSPITAL_COMMUNITY): Payer: Self-pay | Admitting: General Practice

## 2017-10-21 ENCOUNTER — Other Ambulatory Visit: Payer: Self-pay

## 2017-10-21 ENCOUNTER — Ambulatory Visit (HOSPITAL_COMMUNITY): Payer: 59

## 2017-10-21 DIAGNOSIS — R748 Abnormal levels of other serum enzymes: Secondary | ICD-10-CM

## 2017-10-21 DIAGNOSIS — I10 Essential (primary) hypertension: Secondary | ICD-10-CM

## 2017-10-21 DIAGNOSIS — N3000 Acute cystitis without hematuria: Secondary | ICD-10-CM

## 2017-10-21 DIAGNOSIS — R079 Chest pain, unspecified: Secondary | ICD-10-CM

## 2017-10-21 DIAGNOSIS — I2510 Atherosclerotic heart disease of native coronary artery without angina pectoris: Secondary | ICD-10-CM

## 2017-10-21 DIAGNOSIS — R7881 Bacteremia: Secondary | ICD-10-CM

## 2017-10-21 DIAGNOSIS — I5031 Acute diastolic (congestive) heart failure: Secondary | ICD-10-CM

## 2017-10-21 LAB — ECHOCARDIOGRAM COMPLETE
AVLVOTPG: 5 mmHg
CHL CUP RV SYS PRESS: 25 mmHg
CHL CUP TV REG PEAK VELOCITY: 232 cm/s
EWDT: 214 ms
FS: 39 % (ref 28–44)
IVS/LV PW RATIO, ED: 0.92
LA diam index: 1.73 cm/m2
LA vol A4C: 32.6 ml
LA vol index: 20.9 mL/m2
LASIZE: 31 mm
LAVOL: 37.5 mL
LDCA: 2.54 cm2
LEFT ATRIUM END SYS DIAM: 31 mm
LV PW d: 12 mm — AB (ref 0.6–1.1)
LV TDI E'MEDIAL: 5.33
LV e' LATERAL: 6.96 cm/s
LVOT SV: 55 mL
LVOT VTI: 21.7 cm
LVOT diameter: 18 mm
LVOT peak vel: 109 cm/s
Lateral S' vel: 10.1 cm/s
MV Dec: 214
MVPKEVEL: 0.9 m/s
TAPSE: 16.1 mm
TDI e' lateral: 6.96
TR max vel: 232 cm/s
Weight: 2480 oz

## 2017-10-21 LAB — BLOOD CULTURE ID PANEL (REFLEXED)
Acinetobacter baumannii: NOT DETECTED
CARBAPENEM RESISTANCE: NOT DETECTED
Candida albicans: NOT DETECTED
Candida glabrata: NOT DETECTED
Candida krusei: NOT DETECTED
Candida parapsilosis: NOT DETECTED
Candida tropicalis: NOT DETECTED
ENTEROBACTERIACEAE SPECIES: DETECTED — AB
ENTEROCOCCUS SPECIES: NOT DETECTED
Enterobacter cloacae complex: NOT DETECTED
Escherichia coli: NOT DETECTED
HAEMOPHILUS INFLUENZAE: NOT DETECTED
KLEBSIELLA OXYTOCA: NOT DETECTED
Klebsiella pneumoniae: DETECTED — AB
LISTERIA MONOCYTOGENES: NOT DETECTED
NEISSERIA MENINGITIDIS: NOT DETECTED
PSEUDOMONAS AERUGINOSA: NOT DETECTED
Proteus species: NOT DETECTED
STAPHYLOCOCCUS AUREUS BCID: NOT DETECTED
STREPTOCOCCUS AGALACTIAE: NOT DETECTED
STREPTOCOCCUS PNEUMONIAE: NOT DETECTED
STREPTOCOCCUS PYOGENES: NOT DETECTED
STREPTOCOCCUS SPECIES: NOT DETECTED
Serratia marcescens: NOT DETECTED
Staphylococcus species: NOT DETECTED

## 2017-10-21 LAB — BASIC METABOLIC PANEL
Anion gap: 11 (ref 5–15)
BUN: 63 mg/dL — ABNORMAL HIGH (ref 6–20)
CHLORIDE: 100 mmol/L — AB (ref 101–111)
CO2: 25 mmol/L (ref 22–32)
Calcium: 8 mg/dL — ABNORMAL LOW (ref 8.9–10.3)
Creatinine, Ser: 1.44 mg/dL — ABNORMAL HIGH (ref 0.44–1.00)
GFR calc non Af Amer: 38 mL/min — ABNORMAL LOW (ref >60)
GFR, EST AFRICAN AMERICAN: 44 mL/min — AB (ref >60)
Glucose, Bld: 330 mg/dL — ABNORMAL HIGH (ref 65–99)
POTASSIUM: 4 mmol/L (ref 3.5–5.1)
SODIUM: 136 mmol/L (ref 135–145)

## 2017-10-21 LAB — CBC
HEMATOCRIT: 34.6 % — AB (ref 36.0–46.0)
Hemoglobin: 11.6 g/dL — ABNORMAL LOW (ref 12.0–15.0)
MCH: 29.1 pg (ref 26.0–34.0)
MCHC: 33.5 g/dL (ref 30.0–36.0)
MCV: 86.9 fL (ref 78.0–100.0)
Platelets: 86 10*3/uL — ABNORMAL LOW (ref 150–400)
RBC: 3.98 MIL/uL (ref 3.87–5.11)
RDW: 13.3 % (ref 11.5–15.5)
WBC: 12.2 10*3/uL — AB (ref 4.0–10.5)

## 2017-10-21 LAB — HEMOGLOBIN A1C
HEMOGLOBIN A1C: 11.6 % — AB (ref 4.8–5.6)
Mean Plasma Glucose: 286.22 mg/dL

## 2017-10-21 LAB — GLUCOSE, CAPILLARY
Glucose-Capillary: 262 mg/dL — ABNORMAL HIGH (ref 65–99)
Glucose-Capillary: 261 mg/dL — ABNORMAL HIGH (ref 65–99)

## 2017-10-21 LAB — TROPONIN I
Troponin I: 0.08 ng/mL (ref <0.03)
Troponin I: 0.09 ng/mL (ref <0.03)

## 2017-10-21 LAB — LIPID PANEL
Cholesterol: 238 mg/dL — ABNORMAL HIGH (ref 0–200)
LDL CALC: UNDETERMINED mg/dL (ref 0–99)
TRIGLYCERIDES: 630 mg/dL — AB (ref <150)
VLDL: UNDETERMINED mg/dL (ref 0–40)

## 2017-10-21 LAB — CBG MONITORING, ED
Glucose-Capillary: 317 mg/dL — ABNORMAL HIGH (ref 65–99)
Glucose-Capillary: 334 mg/dL — ABNORMAL HIGH (ref 65–99)

## 2017-10-21 LAB — LACTIC ACID, PLASMA: Lactic Acid, Venous: 1.9 mmol/L (ref 0.5–1.9)

## 2017-10-21 LAB — HIV ANTIBODY (ROUTINE TESTING W REFLEX): HIV Screen 4th Generation wRfx: NONREACTIVE

## 2017-10-21 MED ORDER — CARVEDILOL 6.25 MG PO TABS
6.2500 mg | ORAL_TABLET | Freq: Two times a day (BID) | ORAL | Status: DC
  Administered 2017-10-21 – 2017-10-23 (×5): 6.25 mg via ORAL
  Filled 2017-10-21 (×5): qty 1

## 2017-10-21 MED ORDER — LIVING WELL WITH DIABETES BOOK
Freq: Once | Status: DC
  Filled 2017-10-21: qty 1

## 2017-10-21 MED ORDER — INSULIN STARTER KIT- PEN NEEDLES (ENGLISH)
1.0000 | Freq: Once | Status: DC
  Filled 2017-10-21: qty 1

## 2017-10-21 MED ORDER — INFLUENZA VAC SPLIT QUAD 0.5 ML IM SUSY
0.5000 mL | PREFILLED_SYRINGE | INTRAMUSCULAR | Status: AC
  Administered 2017-10-22: 0.5 mL via INTRAMUSCULAR
  Filled 2017-10-21: qty 0.5

## 2017-10-21 MED ORDER — INSULIN GLARGINE 100 UNIT/ML ~~LOC~~ SOLN
15.0000 [IU] | Freq: Every day | SUBCUTANEOUS | Status: DC
  Administered 2017-10-21: 15 [IU] via SUBCUTANEOUS
  Filled 2017-10-21 (×2): qty 0.15

## 2017-10-21 MED ORDER — DEXTROSE 5 % IV SOLN
2.0000 g | INTRAVENOUS | Status: DC
  Administered 2017-10-21 – 2017-10-22 (×2): 2 g via INTRAVENOUS
  Filled 2017-10-21 (×3): qty 2

## 2017-10-21 NOTE — Progress Notes (Addendum)
PROGRESS NOTE    Beverly Martin  FXT:[id number] DOB: [birth year] DOA: 10/20/2017 PCP: Patient, No Pcp Per   Brief Narrative: Patient is a 62 year old female with past medical history of hypertension, diabetes mellitus, coronary artery disease, status post CABG, medical noncompliance who presented to the emergency department with nausea, right flank pain and increased urinary frequency. Patient has been currently treated for urinary tract infection and possible CHF.  Patient also had mild elevation of troponin on presentation.  Cardiology consulted.  Assessment & Plan:   Principal Problem:   UTI (urinary tract infection) Active Problems:   Essential hypertension   Type 2 diabetes mellitus with hyperlipidemia (H[facility])   CAD (coronary artery disease)   Sepsis ([facility])   AKI (acute kidney injury) ([place])   Elevated troponin   Sepsis due to urinary tract infection: Presented with leukocytosis and lactic acidosis.  Lactic acidosis has resolved.  Patient is hemodynamically stable.  Continue IV fluids.  Start on ceftriaxone. We will follow-up blood culture and urine culture.  Uncontrolled diabetes mellitus: Not taking medications for 3 years.  Hemoglobin A1c more than 11.  Blood sugar 544 on presentation. Started on Lantus and sliding scale insulin. Likely patient should be discharged on insulin along with oral medication. Diabetic education will be provided.  Patient will be taught on insulin use Patient should follow-up closely with PCP as an outpatient.  History of coronary artery disease/elevated troponin: Elevated troponin of 0.09 on presentation.  She is status post CABG.  Currently denies any chest pain.   Cardiology has been consulted.  Not plan for any intervention.  Echocardiogram has been ordered. Lipitor is started.  Hypertension: On amlodipine and carvedilol.  Still mildly hypertensive.  We will continue to monitor blood pressure.  Could not he start on ACE inhibitors/ARB due to acute  kidney injury.  Acute kidney injury: Started on IV fluids.  Will follow up BMP test today.  Positive blood culture: Microbiology informed that patient's blood cultures growing Klebsiella pneumonia.  We will follow the final blood culture report and susceptibility.  We will continue ceftriaxone.  Patient continues to need  hospital care and will be managed as an inpatient. DVT prophylaxis: Lovenox Code Status: Full Family Communication: None present on the bedside Disposition Plan: Likely tomorrow to home   Consultants: Cardiology  Procedures: Echocardiogram  Antimicrobials: Ceftriaxone  Subjective: Patient seen and examined the bedside this morning.  Remains comfortable.  Denies any complaints at present.  Objective: Vitals:   10/21/17 0757 10/21/17 0800 10/21/17 0900 10/21/17 1000  BP: (!) 143/72 (!) 164/77 (!) 155/69 (!) 167/87  Pulse: (!) 104 (!) 109 99 (!) 104  Resp: (!) 22 (!) 25 (!) 22 18  Temp: 98.6 F (37 C)     TempSrc: Oral     SpO2: 94% 91% 95% 94%    Intake/Output Summary (Last 24 hours) at 10/21/2017 1104 Last data filed at 10/20/2017 2222 Gross per 24 hour  Intake 1550 ml  Output -  Net 1550 ml   There were no vitals filed for this visit.  Examination:  General exam: Appears calm and comfortable ,Not in distress,average built Respiratory system: Bilateral equal air entry, normal vesicular breath sounds, no wheezes or crackles  Cardiovascular system: S1 & S2 heard, RRR. No JVD, murmurs, rubs, gallops or clicks. No pedal edema. Gastrointestinal system: Abdomen is nondistended, soft and nontender. No organomegaly or masses felt. Normal bowel sounds heard. Central nervous system: Alert and oriented. No focal neurological deficits. Extremities: No edema, no  clubbing ,no cyanosis, distal peripheral pulses palpable. Skin: No rashes, lesions or ulcers,no icterus ,no pallor Psychiatry: Judgement and insight appear normal. Mood & affect appropriate.     Data  Reviewed: I have personally reviewed following labs and imaging studies  CBC: Recent Labs  Lab 10/20/17 1135 10/20/17 1301 10/21/17 0739  WBC  --  11.6* 12.2*  NEUTROABS  --  10.1*  --   HGB 15.0 13.6 11.6*  HCT 44.0 40.8 34.6*  MCV  --  87.6 86.9  PLT  --  104* PENDING   Basic Metabolic Panel: Recent Labs  Lab 10/20/17 1135 10/20/17 1301  NA 133* 131*  K 5.1 5.1  CL 96* 91*  CO2  --  27  GLUCOSE 535* 544*  BUN 61* 74*  CREATININE 1.90* 1.97*  CALCIUM  --  8.8*   GFR: CrCl cannot be calculated (Unknown ideal weight.). Liver Function Tests: Recent Labs  Lab 10/20/17 1301  AST 16  ALT 19  ALKPHOS 149*  BILITOT 1.2  PROT 7.1  ALBUMIN 2.6*   Recent Labs  Lab 10/20/17 2003  LIPASE 33   No results for input(s): AMMONIA in the last 168 hours. Coagulation Profile: Recent Labs  Lab 10/20/17 2003  INR 0.92   Cardiac Enzymes: Recent Labs  Lab 10/20/17 2003 10/21/17 0139  TROPONINI 0.11* 0.08*   BNP (last 3 results) No results for input(s): PROBNP in the last 8760 hours. HbA1C: Recent Labs    10/21/17 0325  HGBA1C 11.6*   CBG: Recent Labs  Lab 10/20/17 2251 10/20/17 2340 10/21/17 0754  GLUCAP 421* 378* 317*   Lipid Profile: Recent Labs    10/21/17 0325  CHOL 238*  HDL <10*  LDLCALC UNABLE TO CALCULATE IF TRIGLYCERIDE OVER 400 mg/dL  TRIG 630*  CHOLHDL NOT CALCULATED   Thyroid Function Tests: No results for input(s): TSH, T4TOTAL, FREET4, T3FREE, THYROIDAB in the last 72 hours. Anemia Panel: No results for input(s): VITAMINB12, FOLATE, FERRITIN, TIBC, IRON, RETICCTPCT in the last 72 hours. Sepsis Labs: Recent Labs  Lab 10/20/17 1334 10/20/17 1755 10/20/17 2003 10/20/17 2341  PROCALCITON  --   --  10.86  --   LATICACIDVEN 2.07* 2.18* 2.1* 1.9    No results found for this or any previous visit (from the past 240 hour(s)).       Radiology Studies: Dg Chest 2 View  Result Date: 10/20/2017 CLINICAL DATA:  Fever and cough.  EXAM: CHEST  2 VIEW COMPARISON:  2/4/6 FINDINGS: Previous median sternotomy and CABG procedure. Small bilateral pleural effusions and mild interstitial edema identified. No pneumothorax identified. IMPRESSION: 1. Mild pulmonary edema and pleural effusions. 2. No pneumothorax after removal of chest tube. Electronically Signed   By: Kerby Moors M.D.   On: 10/20/2017 13:50        Scheduled Meds: . amLODipine  5 mg Oral Daily  . aspirin EC  325 mg Oral Daily  . atorvastatin  40 mg Oral q1800  . carvedilol  6.25 mg Oral BID WC  . enoxaparin (LOVENOX) injection  40 mg Subcutaneous Daily  . insulin aspart  0-9 Units Subcutaneous TID WC  . insulin glargine  15 Units Subcutaneous QHS   Continuous Infusions: . sodium chloride 100 mL/hr at 10/20/17 2051  . cefTRIAXone (ROCEPHIN)  IV       LOS: 1 day    Time spent: 25 minutes    Caspar Favila Jodie Echevaria, MD [facility] Pager [phone]  If 7PM-7AM, please contact night-coverage [email or web address] Password [facility] 10/21/2017,  11:04 AM

## 2017-10-21 NOTE — Progress Notes (Signed)
  Echocardiogram 2D Echocardiogram has been performed.  Jennette Dubin 10/21/2017, 4:06 PM

## 2017-10-21 NOTE — Progress Notes (Signed)
PHARMACY - PHYSICIAN COMMUNICATION CRITICAL VALUE ALERT - BLOOD CULTURE IDENTIFICATION (BCID)  Beverly Martin is an 62 y.o. female who presented to [facility] on 10/20/2017.  Assessment:  62 yo F presents with nausea, R flank pain, and increased urinary frequency. Found to be septic secondary to probably UTI. Pyelo less likely as flank pain resolved and no CVA tenderness on exam. Started on Rocephin for urosepsis.  Name of physician (or Provider) Contacted: A. Adhikari  Current antibiotics: Ceftriaxone  Changes to prescribed antibiotics recommended:  Increase ceftriaxone to 2g IV Q24h Recommendations accepted by provider  No results found for this or any previous visit.  Elenor Quinones, PharmD, BCPS Clinical Pharmacist Pager [phone] 10/21/2017 1:02 PM

## 2017-10-21 NOTE — Progress Notes (Signed)
Inpatient Diabetes Program Recommendations  AACE/ADA: New Consensus Statement on Inpatient Glycemic Control (2015)  Target Ranges:  Prepandial:   less than 140 mg/dL      Peak postprandial:   less than 180 mg/dL (1-2 hours)      Critically ill patients:  140 - 180 mg/dL   Lab Results  Component Value Date   GLUCAP 334 (H) 10/21/2017   HGBA1C 11.6 (H) 10/21/2017    Review of Glycemic ControlResults for Beverly Martin, Beverly Martin (MRN [id number]) as of 10/21/2017 15:08  Ref. Range 10/20/2017 22:51 10/20/2017 23:40 10/21/2017 07:54 10/21/2017 14:47  Glucose-Capillary Latest Ref Range: 65 - 99 mg/dL 421 (H) 378 (H) 317 (H) 334 (H)    Diabetes history: Type 2 DM Outpatient Diabetes medications: None- patient stopped taking medications for DM due to loss of job and insurance Current orders for Inpatient glycemic control:  Novolog sensitive tid with meals, Lantus 15 units q HS  Inpatient Diabetes Program Recommendations:    Spoke with patient regarding elevated A1C and DM.  Gave her handout regarding her A1C of 11.15 which correlates with a blood sugar of approximately 280 mg/dL.  We discussed why it is important to control blood sugars including potential complications and goal blood sugar ranges.  Patient has had a meter in the past but no longer has strips and therefore states she needs a new one.  She also will need a new PCP to manage her DM. Patient states she has never taken insulin in the past and would like to take pills. Based on labs, patient is likely not a candidate for metformin?  May consider starting DPP-4 inhibitor such as Tradjenta 5 mg daily (not renally cleared), however I am not sure this will be enough.  She may need at least basal insulin at home? She also will need an Rx. For glucose meter/strips at d/c.      MD, if patient is to d/c on insulin, please order insulin teaching by bedside RN.  Will follow.   Thanks,  Adah Perl, RN, BC-ADM Inpatient Diabetes Coordinator Pager [phone]  (8a-5p)

## 2017-10-21 NOTE — ED Notes (Addendum)
Pt's O2 saturations drop into high 80s on exertion I.e. When patient walks to the bathroom. Saturations increase on their own after a few minutes.

## 2017-10-21 NOTE — Consult Note (Signed)
Cardiology Consultation:   Patient ID: Beverly Martin; [id number]; 11/30/1955   Admit date: 10/20/2017 Date of Consult: 10/21/2017  Primary Care Provider: Patient, No Pcp Per Primary Cardiologist:  Cardiology  Primary Electrophysiologist:  none   Patient Profile:   Beverly Martin is a 62 y.o. female with a hx of coronary artery disease, coronary artery bypass grafting who is being seen today for the evaluation of congestive heart failure at the request of  Dr. Tawanna Solo .  History of Present Illness:   Beverly Martin is a 62 year old female with a history of coronary artery disease and coronary artery bypass grafting.  She has a history of hypertension, diabetes mellitus.  She was admitted with right flank pain nausea and vomiting and increased urinary frequency.  She was admitted to the hospital on January 8 with several days of nausea and poor appetite.  She has had some generalized weakness.  She has had several loose bowel movements.  She has had some right flank pain.  She presented to the hospital.  She was found to have an elevated white blood cell count and elevated lactic acid count.  She had acute renal insufficiency with a creatinine of 1.97.  Patient was incidentally found to have an elevated troponin level of 0.11.   Her B natruretic peptide is 143.3.    She was hypertensive on admission.  She had not taken any of her blood pressure medicines for the past 3 years.  Patient denies any chest discomfort.  She stopped taking all of her medications several years ago when she lost her job.  She is now working again and has full benefits.  She has not been back to see her medical doctor and has not been back to see any cardiologist.  She does not remember any of her medication names and does not remember any of her doctors names.      Past Medical History:  Diagnosis Date  . CAD (coronary artery disease)   . Diabetes mellitus without complication ([place])   . Essential hypertension      Past Surgical History:  Procedure Laterality Date  . CARDIAC SURGERY     CABG     Home Medications:  Prior to Admission medications   Medication Sig Start Date End Date Taking? Authorizing Provider  aspirin EC 81 MG tablet Take 81 mg by mouth daily.   Yes [provider]  DM-Doxylamine-Acetaminophen (NYQUIL COLD & FLU PO) Take 2 capsules by mouth as needed (cold symptoms).    Yes [provider]    Inpatient Medications: Scheduled Meds: . amLODipine  5 mg Oral Daily  . aspirin EC  325 mg Oral Daily  . atorvastatin  40 mg Oral q1800  . enoxaparin (LOVENOX) injection  40 mg Subcutaneous Daily  . insulin aspart  0-9 Units Subcutaneous TID WC  . insulin glargine  5 Units Subcutaneous Daily   Continuous Infusions: . sodium chloride 100 mL/hr at 10/20/17 2051  . cefTRIAXone (ROCEPHIN)  IV     PRN Meds: acetaminophen **OR** acetaminophen, hydrALAZINE, morphine injection, nitroGLYCERIN, ondansetron (ZOFRAN) IV, zolpidem  Allergies:   No Known Allergies  Social History:   Social History   Socioeconomic History  . Marital status: Married    Spouse name: Not on file  . Number of children: Not on file  . Years of education: Not on file  . Highest education level: Not on file  Social Needs  . Financial resource strain: Not on file  . Food  insecurity - worry: Not on file  . Food insecurity - inability: Not on file  . Transportation needs - medical: Not on file  . Transportation needs - non-medical: Not on file  Occupational History  . Not on file  Tobacco Use  . Smoking status: Never Smoker  . Smokeless tobacco: Never Used  Substance and Sexual Activity  . Alcohol use: No    Frequency: Never  . Drug use: No  . Sexual activity: Not on file  Other Topics Concern  . Not on file  Social History Narrative  . Not on file    Family History:    Family History  Problem Relation Age of Onset  . Diabetes Mellitus II Mother   . Esophageal cancer Father       ROS:  Please see the history of present illness.  ROS  All other ROS reviewed and negative.     Physical Exam/Data:   Vitals:   10/21/17 0600 10/21/17 0603 10/21/17 0700 10/21/17 0757  BP: (!) 155/72 (!) 155/72 (!) 124/52 (!) 143/72  Pulse: 94 (!) 103 (!) 102 (!) 104  Resp:  19 (!) 22 (!) 22  Temp:    98.6 F (37 C)  TempSrc:    Oral  SpO2: 92% 99% 93% 94%    Intake/Output Summary (Last 24 hours) at 10/21/2017 8527 Last data filed at 10/20/2017 2222 Gross per 24 hour  Intake 1550 ml  Output -  Net 1550 ml    General: Middle-aged female, no acute distress. HEENT: normal Lymph: no adenopathy Neck: no JVD Endocrine:  No thryomegaly Vascular: No carotid bruits; FA pulses 2+ bilaterally without bruits  Cardiac:  normal S1, S2; soft systolic murmur. Lungs:  clear to auscultation bilaterally, no wheezing, rhonchi or rales  Abd: soft, nontender, no hepatomegaly  Ext: no edema Musculoskeletal:  No deformities, BUE and BLE strength normal and equal Skin: warm and dry  Neuro:  CNs 2-12 intact, no focal abnormalities noted Psych:  Normal affect   EKG:  The EKG was personally reviewed and demonstrates:   Sinus tachycardia at 105 beats a minute.  She has left ventricular hypertrophy with repolarization abnormality. Telemetry:  Telemetry was personally reviewed and demonstrates: Sinus tachycardia at 114  Relevant CV Studies: Echocardiogram has been ordered and will be done later today.  Laboratory Data:  Chemistry Recent Labs  Lab 10/20/17 1135 10/20/17 1301  NA 133* 131*  K 5.1 5.1  CL 96* 91*  CO2  --  27  GLUCOSE 535* 544*  BUN 61* 74*  CREATININE 1.90* 1.97*  CALCIUM  --  8.8*  GFRNONAA  --  26*  GFRAA  --  30*  ANIONGAP  --  13    Recent Labs  Lab 10/20/17 1301  PROT 7.1  ALBUMIN 2.6*  AST 16  ALT 19  ALKPHOS 149*  BILITOT 1.2   Hematology Recent Labs  Lab 10/20/17 1135 10/20/17 1301  WBC  --  11.6*  RBC  --  4.66  HGB 15.0 13.6  HCT 44.0  40.8  MCV  --  87.6  MCH  --  29.2  MCHC  --  33.3  RDW  --  13.1  PLT  --  104*   Cardiac Enzymes Recent Labs  Lab 10/20/17 2003 10/21/17 0139  TROPONINI 0.11* 0.08*    Recent Labs  Lab 10/20/17 1753  TROPIPOC 0.09*    BNP Recent Labs  Lab 10/20/17 1740  BNP 143.3*    DDimer No results  for input(s): DDIMER in the last 168 hours.  Radiology/Studies:  Dg Chest 2 View  Result Date: 10/20/2017 CLINICAL DATA:  Fever and cough. EXAM: CHEST  2 VIEW COMPARISON:  2/4/6 FINDINGS: Previous median sternotomy and CABG procedure. Small bilateral pleural effusions and mild interstitial edema identified. No pneumothorax identified. IMPRESSION: 1. Mild pulmonary edema and pleural effusions. 2. No pneumothorax after removal of chest tube. Electronically Signed   By: Kerby Moors M.D.   On: 10/20/2017 13:50    Assessment and Plan:   1.  Congestive heart failure: The patient presented with shortness of breath last night.  This was in the setting of hypertension and urosepsis.  She may have had some transit diastolic dysfunction.  We do not have a recent echocardiogram yet.  She remains tachycardic.  We will add carvedilol 6.25 mg twice a day. Her creatinine remains 1.97.  I do not know if this is chronic or not.  We will hold off on adding an ARB at this time.  We will anticipate adding hydralazine if she remains hypertensive.  She currently has no shortness of breath and does not have acute decompensated heart failure.  2.  Hypertension: The patient has not had any blood pressure medicines in 3-4 years.  She has been started on amlodipine and I am starting carvedilol.  She has renal insufficiency-I am not sure if this is chronic or acute on chronic.  Would consider adding hydralazine as needed for better blood pressure control.  We will probably also be able to titrate up her carvedilol.  3.  Troponin elevation: Her troponin elevation is trivial and is almost certainly related to her  urosepsis.  He denies any episodes of chest discomfort.  She is not having any ischemia and we do not have any plans for ischemic workup at this time. I see that she is n.p.o.  From a cardiology standpoint, we would not anticipate doing any invasive procedures on her today.  4.  Diabetes mellitus: Her hemoglobin A1c is above 11.  Her glucose level on admission was greater than 500.  Further plans per the internal medicine team.   For questions or updates, please contact [place] Please consult [email or web address] for contact info under Cardiology/STEMI.   Signed, Mertie Moores, MD  10/21/2017 9:17 AM

## 2017-10-21 NOTE — ED Notes (Signed)
Lunch tray at bedside. ?

## 2017-10-21 NOTE — ED Notes (Signed)
Admitting Team at the bedside 

## 2017-10-21 NOTE — ED Notes (Signed)
Pt transported to EEG 

## 2017-10-22 LAB — CBC WITH DIFFERENTIAL/PLATELET
Basophils Absolute: 0 10*3/uL (ref 0.0–0.1)
Basophils Relative: 0 %
EOS PCT: 0 %
Eosinophils Absolute: 0 10*3/uL (ref 0.0–0.7)
HEMATOCRIT: 31.1 % — AB (ref 36.0–46.0)
HEMOGLOBIN: 10.3 g/dL — AB (ref 12.0–15.0)
LYMPHS ABS: 1 10*3/uL (ref 0.7–4.0)
Lymphocytes Relative: 7 %
MCH: 28.7 pg (ref 26.0–34.0)
MCHC: 33.1 g/dL (ref 30.0–36.0)
MCV: 86.6 fL (ref 78.0–100.0)
MONOS PCT: 11 %
Monocytes Absolute: 1.5 10*3/uL — ABNORMAL HIGH (ref 0.1–1.0)
NEUTROS PCT: 82 %
Neutro Abs: 11.3 10*3/uL — ABNORMAL HIGH (ref 1.7–7.7)
Platelets: 102 10*3/uL — ABNORMAL LOW (ref 150–400)
RBC: 3.59 MIL/uL — AB (ref 3.87–5.11)
RDW: 13.5 % (ref 11.5–15.5)
WBC: 13.8 10*3/uL — AB (ref 4.0–10.5)

## 2017-10-22 LAB — BASIC METABOLIC PANEL
ANION GAP: 8 (ref 5–15)
BUN: 57 mg/dL — ABNORMAL HIGH (ref 6–20)
CHLORIDE: 104 mmol/L (ref 101–111)
CO2: 24 mmol/L (ref 22–32)
Calcium: 7.7 mg/dL — ABNORMAL LOW (ref 8.9–10.3)
Creatinine, Ser: 1.21 mg/dL — ABNORMAL HIGH (ref 0.44–1.00)
GFR calc Af Amer: 54 mL/min — ABNORMAL LOW (ref >60)
GFR calc non Af Amer: 47 mL/min — ABNORMAL LOW (ref >60)
GLUCOSE: 211 mg/dL — AB (ref 65–99)
POTASSIUM: 3.8 mmol/L (ref 3.5–5.1)
Sodium: 136 mmol/L (ref 135–145)

## 2017-10-22 LAB — GLUCOSE, CAPILLARY
GLUCOSE-CAPILLARY: 202 mg/dL — AB (ref 65–99)
Glucose-Capillary: 209 mg/dL — ABNORMAL HIGH (ref 65–99)
Glucose-Capillary: 325 mg/dL — ABNORMAL HIGH (ref 65–99)
Glucose-Capillary: 188 mg/dL — ABNORMAL HIGH (ref 65–99)

## 2017-10-22 MED ORDER — INSULIN GLARGINE 100 UNIT/ML ~~LOC~~ SOLN
20.0000 [IU] | Freq: Every day | SUBCUTANEOUS | Status: DC
  Filled 2017-10-22: qty 0.2

## 2017-10-22 MED ORDER — INSULIN STARTER KIT- SYRINGES (ENGLISH)
1.0000 | Freq: Once | Status: DC
  Filled 2017-10-22: qty 1

## 2017-10-22 MED ORDER — AMLODIPINE BESYLATE 10 MG PO TABS
10.0000 mg | ORAL_TABLET | Freq: Every day | ORAL | Status: DC
  Administered 2017-10-23: 10 mg via ORAL
  Filled 2017-10-22: qty 1

## 2017-10-22 MED ORDER — INSULIN GLARGINE 100 UNIT/ML ~~LOC~~ SOLN
25.0000 [IU] | Freq: Every day | SUBCUTANEOUS | Status: DC
  Administered 2017-10-22: 25 [IU] via SUBCUTANEOUS
  Filled 2017-10-22: qty 0.25

## 2017-10-22 NOTE — Evaluation (Signed)
Physical Therapy Evaluation Patient Details Name: Beverly Martin MRN: [id number] DOB: 02/28/1956 Today's Date: 10/22/2017   History of Present Illness  Pt is a 62 y.o. female admitted 10/20/17 with nausea, R flank pain, and increased urinary frequency; worked up for sepsis due to UTI and AKI. PMH includes HTN, DM, CAD s/p CABG, bilat TKAs.     Clinical Impression  Patient evaluated by Physical Therapy with no further acute PT needs identified. All education has been completed and the patient has no further questions. Currently indep with all mobility. Encouraged to continue amb during hospital stay (RN notified). PT is signing off. Thank you for this referral.    Follow Up Recommendations No PT follow up    Equipment Recommendations  None recommended by PT    Recommendations for Other Services       Precautions / Restrictions Precautions Precautions: None Restrictions Weight Bearing Restrictions: No      Mobility  Bed Mobility               General bed mobility comments: Received sitting in chair  Transfers Overall transfer level: Independent Equipment used: None                Ambulation/Gait Ambulation/Gait assistance: Independent Ambulation Distance (Feet): 1200 Feet Assistive device: None Gait Pattern/deviations: Step-through pattern;Decreased stride length     General Gait Details: Indep with slow, controlled amb  Stairs            Wheelchair Mobility    Modified Rankin (Stroke Patients Only)       Balance Overall balance assessment: Independent                                           Pertinent Vitals/Pain Pain Assessment: No/denies pain    Home Living Family/patient expects to be discharged to:: Private residence Living Arrangements: Spouse/significant other Available Help at Discharge: Family;Available PRN/intermittently Type of Home: House Home Access: Level entry     Home Layout: One level Home  Equipment: None      Prior Function Level of Independence: Independent         Comments: Works 7 days/wk. 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, newspaper        Extremity/Trunk Assessment   Upper Extremity Assessment Upper Extremity Assessment: Overall WFL for tasks assessed    Lower Extremity Assessment Lower Extremity Assessment: Overall WFL for tasks assessed       Communication   Communication: No difficulties  Cognition Arousal/Alertness: Awake/alert Behavior During Therapy: WFL for tasks assessed/performed Overall Cognitive Status: Within Functional Limits for tasks assessed                                        General Comments      Exercises     Assessment/Plan    PT Assessment Patent does not need any further PT services  PT Problem List         PT Treatment Interventions      PT Goals (Current goals can be found in the Care Plan section)  Acute Rehab PT Goals PT Goal Formulation: All assessment and education complete, DC therapy    Frequency     Barriers to discharge        Co-evaluation  AM-PAC PT "6 Clicks" Daily Activity  Outcome Measure Difficulty turning over in bed (including adjusting bedclothes, sheets and blankets)?: None Difficulty moving from lying on back to sitting on the side of the bed? : None Difficulty sitting down on and standing up from a chair with arms (e.g., wheelchair, bedside commode, etc,.)?: None Help needed moving to and from a bed to chair (including a wheelchair)?: None Help needed walking in hospital room?: None Help needed climbing 3-5 steps with a railing? : None 6 Click Score: 24    End of Session Equipment Utilized During Treatment: Gait belt Activity Tolerance: Patient tolerated treatment well Patient left: in chair;with call bell/phone within reach Nurse Communication: Mobility status PT Visit Diagnosis: Other abnormalities of gait and mobility (R26.89)     Time: 8295-6213 PT Time Calculation (min) (ACUTE ONLY): 19 min   Charges:   PT Evaluation $PT Eval Low Complexity: 1 Low     PT G Codes:       Mabeline Caras, PT, DPT Acute Rehab Services  Pager: [place] 10/22/2017, 9:07 AM

## 2017-10-22 NOTE — Progress Notes (Signed)
PROGRESS NOTE    Beverly Martin  QBV:[id number] DOB: 05/29/1956 DOA: 10/20/2017 PCP: Patient, No Pcp Per   Brief Narrative: Patient is a 62 year old female with past medical history of hypertension, diabetes mellitus, coronary artery disease, status post CABG, medical noncompliance who presented to the emergency department with nausea, right flank pain and increased urinary frequency. Patient has been currently treated for urinary tract infection and possible CHF.  Patient also had mild elevation of troponin on presentation.  Cardiology consulted.  Assessment & Plan:   Principal Problem:   UTI (urinary tract infection) Active Problems:   Essential hypertension   Type 2 diabetes mellitus with hyperlipidemia (HCC)   CAD (coronary artery disease)   Sepsis ([facility]CC)   AKI (acute kidney injury) ([place])   Elevated troponin   Positive blood culture   Sepsis due to urinary tract infection: Presented with leukocytosis and lactic acidosis.  Lactic acidosis has resolved.  Patient is hemodynamically stable.  Continue IV fluids.  Started on ceftriaxone. Blood culture growing Klebsiella. Has mild leukocytosis.  Uncontrolled diabetes mellitus: Not taking medications for 3 years.  Hemoglobin A1c more than 11.  Blood sugar 544 on presentation. Started on Lantus and sliding scale insulin. Likely patient should be discharged on insulin along with oral medication. Diabetic education will be provided.  Patient will be taught on insulin use Patient should follow-up closely with PCP as an outpatient.  History of coronary artery disease/elevated troponin: Elevated troponin of 0.09 on presentation.  She is status post CABG.  Currently denies any chest pain.   Cardiology has been consulted.  Not plan for any intervention.  Echocardiogram normal .Lipitor is started.  Hypertension: On amlodipine and carvedilol.    We will continue to monitor blood pressure.  Could not he start on ACE inhibitors/ARB due to acute  kidney injury.  Acute kidney injury: Started on IV fluids.  Improving  Positive blood culture: Microbiology informed that patient's blood cultures growing Klebsiella pneumonia.  We will follow the final blood culture report and susceptibility.  We will continue ceftriaxone.  Patient continues to need  hospital care and will be managed as an inpatient. DVT prophylaxis: Lovenox Code Status: Full Family Communication: None present on the bedside Disposition Plan: Likely tomorrow to home   Consultants: Cardiology  Procedures: Echocardiogram  Antimicrobials: Ceftriaxone  Subjective: Patient seen and examined the bedside this morning.  Remains comfortable.  We are waiting for susceptibility test from blood culture.  I informed my plan to discharge her on insulin in 1-2 days.  Objective: Vitals:   10/21/17 1728 10/21/17 2112 10/22/17 0403 10/22/17 0749  BP: (!) 143/58 (!) 116/47 (!) 133/51 (!) 159/62  Pulse: 81 84 76 76  Resp: '18 20 15 18  '$ Temp: 98.1 F (36.7 C) 98.2 F (36.8 C) (!) 97.4 F (36.3 C) 98.2 F (36.8 C)  TempSrc: Oral Oral Oral Oral  SpO2: 98% 96% 93% 96%  Weight: 62.2 kg (137 lb 3.2 oz)  63 kg (138 lb 12.8 oz)   Height: '5\' 3"'$  (1.6 m)       Intake/Output Summary (Last 24 hours) at 10/22/2017 0947 Last data filed at 10/22/2017 0840 Gross per 24 hour  Intake 3368.33 ml  Output 500 ml  Net 2868.33 ml   Filed Weights   10/21/17 1728 10/22/17 0403  Weight: 62.2 kg (137 lb 3.2 oz) 63 kg (138 lb 12.8 oz)    Examination:  General exam: Appears calm and comfortable ,Not in distress,average built Respiratory system: Bilateral equal air  entry, normal vesicular breath sounds, no wheezes or crackles  Cardiovascular system: S1 & S2 heard, RRR. No JVD, murmurs, rubs, gallops or clicks. No pedal edema. Gastrointestinal system: Abdomen is nondistended, soft and nontender. No organomegaly or masses felt. Normal bowel sounds heard. Central nervous system: Alert and oriented.  No focal neurological deficits. Extremities: No edema, no clubbing ,no cyanosis, distal peripheral pulses palpable. Skin: No cyanosis,No pallor,No Rash,No Ulcer Psychiatry: Judgement and insight appear normal. Mood & affect appropriate.     Data Reviewed: I have personally reviewed following labs and imaging studies  CBC: Recent Labs  Lab 10/20/17 1135 10/20/17 1301 10/21/17 0739 10/22/17 0642  WBC  --  11.6* 12.2* 13.8*  NEUTROABS  --  10.1*  --  PENDING  HGB 15.0 13.6 11.6* 10.3*  HCT 44.0 40.8 34.6* 31.1*  MCV  --  87.6 86.9 86.6  PLT  --  104* 86* 456*   Basic Metabolic Panel: Recent Labs  Lab 10/20/17 1135 10/20/17 1301 10/21/17 0739 10/22/17 0642  NA 133* 131* 136 136  K 5.1 5.1 4.0 3.8  CL 96* 91* 100* 104  CO2  --  '27 25 24  '$ GLUCOSE 535* 544* 330* 211*  BUN 61* 74* 63* 57*  CREATININE 1.90* 1.97* 1.44* 1.21*  CALCIUM  --  8.8* 8.0* 7.7*   GFR: Estimated Creatinine Clearance: 43.1 mL/min (A) (by C-G formula based on SCr of 1.21 mg/dL (H)). Liver Function Tests: Recent Labs  Lab 10/20/17 1301  AST 16  ALT 19  ALKPHOS 149*  BILITOT 1.2  PROT 7.1  ALBUMIN 2.6*   Recent Labs  Lab 10/20/17 2003  LIPASE 33   No results for input(s): AMMONIA in the last 168 hours. Coagulation Profile: Recent Labs  Lab 10/20/17 2003  INR 0.92   Cardiac Enzymes: Recent Labs  Lab 10/20/17 2003 10/21/17 0139 10/21/17 0739  TROPONINI 0.11* 0.08* 0.09*   BNP (last 3 results) No results for input(s): PROBNP in the last 8760 hours. HbA1C: Recent Labs    10/21/17 0325  HGBA1C 11.6*   CBG: Recent Labs  Lab 10/21/17 0754 10/21/17 1447 10/21/17 1725 10/21/17 2114 10/22/17 0725  GLUCAP 317* 334* 262* 261* 209*   Lipid Profile: Recent Labs    10/21/17 0325  CHOL 238*  HDL <10*  LDLCALC UNABLE TO CALCULATE IF TRIGLYCERIDE OVER 400 mg/dL  TRIG 630*  CHOLHDL NOT CALCULATED   Thyroid Function Tests: No results for input(s): TSH, T4TOTAL, FREET4,  T3FREE, THYROIDAB in the last 72 hours. Anemia Panel: No results for input(s): VITAMINB12, FOLATE, FERRITIN, TIBC, IRON, RETICCTPCT in the last 72 hours. Sepsis Labs: Recent Labs  Lab 10/20/17 1334 10/20/17 1755 10/20/17 2003 10/20/17 2341  PROCALCITON  --   --  10.86  --   LATICACIDVEN 2.07* 2.18* 2.1* 1.9    Recent Results (from the past 240 hour(s))  Blood culture (routine x 2)     Status: Abnormal (Preliminary result)   Collection Time: 10/20/17  8:00 PM  Result Value Ref Range Status   Specimen Description BLOOD RIGHT HAND  Final   Special Requests   Final    BOTTLES DRAWN AEROBIC AND ANAEROBIC Blood Culture adequate volume   Culture  Setup Time   Final    GRAM NEGATIVE RODS IN BOTH AEROBIC AND ANAEROBIC BOTTLES CRITICAL RESULT CALLED TO, READ BACK BY AND VERIFIED WITH: N. BATCHELDER, RPHARMD AT [place] ON 10/21/17 BY C. JESSUP, MLT.     Culture (A)  Final    KLEBSIELLA PNEUMONIAE  SUSCEPTIBILITIES TO FOLLOW    Report Status PENDING  Incomplete  Blood Culture ID Panel (Reflexed)     Status: Abnormal   Collection Time: 10/20/17  8:00 PM  Result Value Ref Range Status   Enterococcus species NOT DETECTED NOT DETECTED Final   Listeria monocytogenes NOT DETECTED NOT DETECTED Final   Staphylococcus species NOT DETECTED NOT DETECTED Final   Staphylococcus aureus NOT DETECTED NOT DETECTED Final   Streptococcus species NOT DETECTED NOT DETECTED Final   Streptococcus agalactiae NOT DETECTED NOT DETECTED Final   Streptococcus pneumoniae NOT DETECTED NOT DETECTED Final   Streptococcus pyogenes NOT DETECTED NOT DETECTED Final   Acinetobacter baumannii NOT DETECTED NOT DETECTED Final   Enterobacteriaceae species DETECTED (A) NOT DETECTED Final    Comment: Enterobacteriaceae represent a large family of gram-negative bacteria, not a single organism. CRITICAL RESULT CALLED TO, READ BACK BY AND VERIFIED WITH: N. BATCHELDER, RPHARMD AT 5427 ON 10/21/17 BY C. JESSUP, MLT    Enterobacter  cloacae complex NOT DETECTED NOT DETECTED Final   Escherichia coli NOT DETECTED NOT DETECTED Final   Klebsiella oxytoca NOT DETECTED NOT DETECTED Final   Klebsiella pneumoniae DETECTED (A) NOT DETECTED Final    Comment: CRITICAL RESULT CALLED TO, READ BACK BY AND VERIFIED WITH: N. BATCHELDER, RPHARMD AT 1240 ON 10/21/17 BY C. JESSUP, MLT    Proteus species NOT DETECTED NOT DETECTED Final   Serratia marcescens NOT DETECTED NOT DETECTED Final   Carbapenem resistance NOT DETECTED NOT DETECTED Final   Haemophilus influenzae NOT DETECTED NOT DETECTED Final   Neisseria meningitidis NOT DETECTED NOT DETECTED Final   Pseudomonas aeruginosa NOT DETECTED NOT DETECTED Final   Candida albicans NOT DETECTED NOT DETECTED Final   Candida glabrata NOT DETECTED NOT DETECTED Final   Candida krusei NOT DETECTED NOT DETECTED Final   Candida parapsilosis NOT DETECTED NOT DETECTED Final   Candida tropicalis NOT DETECTED NOT DETECTED Final  Blood culture (routine x 2)     Status: None (Preliminary result)   Collection Time: 10/20/17  8:03 PM  Result Value Ref Range Status   Specimen Description BLOOD LEFT ANTECUBITAL  Final   Special Requests   Final    BOTTLES DRAWN AEROBIC AND ANAEROBIC Blood Culture adequate volume   Culture  Setup Time   Final    GRAM NEGATIVE RODS IN BOTH AEROBIC AND ANAEROBIC BOTTLES CRITICAL VALUE NOTED.  VALUE IS CONSISTENT WITH PREVIOUSLY REPORTED AND CALLED VALUE.    Culture GRAM NEGATIVE RODS  Final   Report Status PENDING  Incomplete         Radiology Studies: Dg Chest 2 View  Result Date: 10/20/2017 CLINICAL DATA:  Fever and cough. EXAM: CHEST  2 VIEW COMPARISON:  2/4/6 FINDINGS: Previous median sternotomy and CABG procedure. Small bilateral pleural effusions and mild interstitial edema identified. No pneumothorax identified. IMPRESSION: 1. Mild pulmonary edema and pleural effusions. 2. No pneumothorax after removal of chest tube. Electronically Signed   By: Kerby Moors  M.D.   On: 10/20/2017 13:50        Scheduled Meds: . amLODipine  5 mg Oral Daily  . aspirin EC  325 mg Oral Daily  . atorvastatin  40 mg Oral q1800  . carvedilol  6.25 mg Oral BID WC  . enoxaparin (LOVENOX) injection  40 mg Subcutaneous Daily  . insulin aspart  0-9 Units Subcutaneous TID WC  . insulin glargine  20 Units Subcutaneous QHS  . insulin starter kit- pen needles  1 kit Other Once  .  living well with diabetes book   Does not apply Once   Continuous Infusions: . sodium chloride 100 mL/hr at 10/21/17 2349  . cefTRIAXone (ROCEPHIN)  IV Stopped (10/21/17 2252)     LOS: 2 days    Time spent: 25 minutes    Langley Flatley Jodie Echevaria, MD [facility] Pager [phone]  If 7PM-7AM, please contact night-coverage [email or web address] Password [facility] 10/22/2017, 9:47 AM

## 2017-10-22 NOTE — Progress Notes (Signed)
Pt currently on heart healty diet, MD paged concerning changing to carb modified due to pt been diabetic and blood glucose running high.

## 2017-10-22 NOTE — Progress Notes (Signed)
Pt had slight nose bleed (stated she blew her nose) resolved after sitting up and holding pressure MD notified.

## 2017-10-22 NOTE — Care Management Note (Signed)
Case Management Note  Patient Details  Name: Beverly Martin MRN: [id number] Date of Birth: 1956/06/26  Subjective/Objective:    Admitted with  urinary tract infection and possible CHF           Action/Plan: In to speak with patient, prior to admission patient lived at home with spouse.  In to discuss no PCP noted.  Located PCP: Grier Mitts MD and scheduled follow up and new patient appointment with Patient present.  Uses [facility] on [address].  Home DME: None.  Patient denies inability to afford medications or food.  Expected Discharge Date:  10/23/17                Expected Discharge Plan:  Home/Self Care  Discharge planning Services  CM Consult, Follow-up appt scheduled(PCP )  Status of Service:  Completed, signed off  Kristen Cardinal, RN  Nurse Case Manager-orientation [phone] 10/22/2017, 12:14 PM

## 2017-10-22 NOTE — Progress Notes (Signed)
Inpatient Diabetes Program Recommendations  AACE/ADA: New Consensus Statement on Inpatient Glycemic Control (2015)  Target Ranges:  Prepandial:   less than 140 mg/dL      Peak postprandial:   less than 180 mg/dL (1-2 hours)      Critically ill patients:  140 - 180 mg/dL   Lab Results  Component Value Date   GLUCAP 209 (H) 10/22/2017   HGBA1C 11.6 (H) 10/21/2017   Review of Glycemic Control  Diabetes history: Type 2 DM Outpatient Diabetes medications: None- patient stopped taking medications for DM due to loss of job and insurance Current orders for Inpatient glycemic control:  Novolog sensitive tid with meals, Lantus 15 units q HS  Inpatient Diabetes Program Recommendations:    Patient received total of 20 units of Lantus yesterday 5 units in the am and 15 units last night. Fasting glucose 209 mg/dl this am. Please consider increasing Lantus to 20-25 units for today.  Refer to DM coordinator notes from yesterday for outpatient recommendations.  Thanks,  Tama Headings RN, MSN, [facility] Inpatient Diabetes Coordinator Team Pager [phone] (8a-5p)

## 2017-10-22 NOTE — Progress Notes (Signed)
Progress Note  Patient Name: Beverly Martin Date of Encounter: 10/22/2017  Primary Cardiologist: Dr. Acie Fredrickson (new)  Subjective   Feeling much better.  No chest pain or shortness of breath.   Inpatient Medications    Scheduled Meds: . amLODipine  5 mg Oral Daily  . aspirin EC  325 mg Oral Daily  . atorvastatin  40 mg Oral q1800  . carvedilol  6.25 mg Oral BID WC  . enoxaparin (LOVENOX) injection  40 mg Subcutaneous Daily  . insulin aspart  0-9 Units Subcutaneous TID WC  . insulin glargine  20 Units Subcutaneous QHS  . insulin starter kit- pen needles  1 kit Other Once  . insulin starter kit- syringes  1 kit Other Once  . living well with diabetes book   Does not apply Once   Continuous Infusions: . sodium chloride 100 mL/hr at 10/21/17 2349  . cefTRIAXone (ROCEPHIN)  IV Stopped (10/21/17 2252)   PRN Meds: acetaminophen **OR** acetaminophen, hydrALAZINE, morphine injection, nitroGLYCERIN, ondansetron (ZOFRAN) IV, zolpidem   Vital Signs    Vitals:   10/21/17 1728 10/21/17 2112 10/22/17 0403 10/22/17 0749  BP: (!) 143/58 (!) 116/47 (!) 133/51 (!) 159/62  Pulse: 81 84 76 76  Resp: '18 20 15 18  '$ Temp: 98.1 F (36.7 C) 98.2 F (36.8 C) (!) 97.4 F (36.3 C) 98.2 F (36.8 C)  TempSrc: Oral Oral Oral Oral  SpO2: 98% 96% 93% 96%  Weight: 137 lb 3.2 oz (62.2 kg)  138 lb 12.8 oz (63 kg)   Height: '5\' 3"'$  (1.6 m)       Intake/Output Summary (Last 24 hours) at 10/22/2017 1324 Last data filed at 10/22/2017 0840 Gross per 24 hour  Intake 3368.33 ml  Output 500 ml  Net 2868.33 ml   Filed Weights   10/21/17 1728 10/22/17 0403  Weight: 137 lb 3.2 oz (62.2 kg) 138 lb 12.8 oz (63 kg)    Telemetry    Sinus rhythm.  No events.  - Personally Reviewed  ECG    n/a - Personally Reviewed  Physical Exam   VS:  BP (!) 159/62 (BP Location: Right Arm)   Pulse 76   Temp 98.2 F (36.8 C) (Oral)   Resp 18   Ht '5\' 3"'$  (1.6 m)   Wt 138 lb 12.8 oz (63 kg)   SpO2 96%   BMI 24.59  kg/m  , BMI Body mass index is 24.59 kg/m. GENERAL:  Well appearing HEENT: Pupils equal round and reactive, fundi not visualized, oral mucosa unremarkable NECK:  No jugular venous distention, waveform within normal limits, carotid upstroke brisk and symmetric, no bruits, no thyromegaly LUNGS:  Clear to auscultation bilaterally HEART:  RRR.  PMI not displaced or sustained,S1 and S2 within normal limits, no S3, no S4, no clicks, no rubs, no murmurs ABD:  Flat, positive bowel sounds normal in frequency in pitch, no bruits, no rebound, no guarding, no midline pulsatile mass, no hepatomegaly, no splenomegaly EXT:  2 plus pulses throughout, no edema, no cyanosis no clubbing SKIN:  No rashes no nodules NEURO:  Cranial nerves II through XII grossly intact, motor grossly intact throughout [facility]:  Cognitively intact, oriented to person place and time   Labs    Chemistry Recent Labs  Lab 10/20/17 1301 10/21/17 0739 10/22/17 0642  NA 131* 136 136  K 5.1 4.0 3.8  CL 91* 100* 104  CO2 '27 25 24  '$ GLUCOSE 544* 330* 211*  BUN 74* 63* 57*  CREATININE  1.97* 1.44* 1.21*  CALCIUM 8.8* 8.0* 7.7*  PROT 7.1  --   --   ALBUMIN 2.6*  --   --   AST 16  --   --   ALT 19  --   --   ALKPHOS 149*  --   --   BILITOT 1.2  --   --   GFRNONAA 26* 38* 47*  GFRAA 30* 44* 54*  ANIONGAP '13 11 8     '$ Hematology Recent Labs  Lab 10/20/17 1301 10/21/17 0739 10/22/17 0642  WBC 11.6* 12.2* 13.8*  RBC 4.66 3.98 3.59*  HGB 13.6 11.6* 10.3*  HCT 40.8 34.6* 31.1*  MCV 87.6 86.9 86.6  MCH 29.2 29.1 28.7  MCHC 33.3 33.5 33.1  RDW 13.1 13.3 13.5  PLT 104* 86* 102*    Cardiac Enzymes Recent Labs  Lab 10/20/17 2003 10/21/17 0139 10/21/17 0739  TROPONINI 0.11* 0.08* 0.09*    Recent Labs  Lab 10/20/17 1753  TROPIPOC 0.09*     BNP Recent Labs  Lab 10/20/17 1740  BNP 143.3*     DDimer No results for input(s): DDIMER in the last 168 hours.   Radiology    Dg Chest 2 View  Result Date:  10/20/2017 CLINICAL DATA:  Fever and cough. EXAM: CHEST  2 VIEW COMPARISON:  2/4/6 FINDINGS: Previous median sternotomy and CABG procedure. Small bilateral pleural effusions and mild interstitial edema identified. No pneumothorax identified. IMPRESSION: 1. Mild pulmonary edema and pleural effusions. 2. No pneumothorax after removal of chest tube. Electronically Signed   By: Kerby Moors M.D.   On: 10/20/2017 13:50    Cardiac Studies   Echo 10/21/17: Study Conclusions  - Left ventricle: Inferior basal hypokinesis. The cavity size was   normal. Systolic function was normal. The estimated ejection   fraction was in the range of 50% to 55%. Wall motion was normal;   there were no regional wall motion abnormalities. Left   ventricular diastolic function parameters were normal. - Atrial septum: No defect or patent foramen ovale was identified.  Patient Profile     62 y.o. female with CAD s/p CABG, diabetes, hypertension and hyperlipidemia off all medications for 3 years admitted for sepsis from a urinary source.   Assessment & Plan    # CAD s/p CABG:  # Elevated troponin: Troponin mildly elevated.  Likely 2/2 sepsis.  She has no chest pain.  Very mild hypokinesis of the basal inferior myocardium.  Systolic function preserved.  No further ischemia evaluation at this time.  Continue aspirin and atorvastatin.  She will need lipids checked in 6-8 weeks.   # Hypertension: BP poorly controlled.  She has been off all meds for years.  Increase amlodipine to 10 mg daily. Continue carvedilol.  She is mildly bradycardic at times so we will not increase.  No ACE-I/ARB 2/2 renal dysfunction.   For questions or updates, please contact [place] Please consult [email or web address] for contact info under Cardiology/STEMI.      Signed, Skeet Latch, MD  10/22/2017, 1:24 PM

## 2017-10-23 DIAGNOSIS — E119 Type 2 diabetes mellitus without complications: Secondary | ICD-10-CM

## 2017-10-23 LAB — CULTURE, BLOOD (ROUTINE X 2)
Special Requests: ADEQUATE
Special Requests: ADEQUATE

## 2017-10-23 LAB — CBC WITH DIFFERENTIAL/PLATELET
BASOS ABS: 0 10*3/uL (ref 0.0–0.1)
Basophils Relative: 0 %
EOS PCT: 1 %
Eosinophils Absolute: 0.1 10*3/uL (ref 0.0–0.7)
HEMATOCRIT: 30.4 % — AB (ref 36.0–46.0)
HEMOGLOBIN: 9.9 g/dL — AB (ref 12.0–15.0)
LYMPHS PCT: 9 %
Lymphs Abs: 1.3 10*3/uL (ref 0.7–4.0)
MCH: 28.6 pg (ref 26.0–34.0)
MCHC: 32.6 g/dL (ref 30.0–36.0)
MCV: 87.9 fL (ref 78.0–100.0)
MONOS PCT: 8 %
Monocytes Absolute: 1.1 10*3/uL — ABNORMAL HIGH (ref 0.1–1.0)
Neutro Abs: 11.8 10*3/uL — ABNORMAL HIGH (ref 1.7–7.7)
Neutrophils Relative %: 82 %
Platelets: 143 10*3/uL — ABNORMAL LOW (ref 150–400)
RBC: 3.46 MIL/uL — ABNORMAL LOW (ref 3.87–5.11)
RDW: 13.7 % (ref 11.5–15.5)
WBC: 14.3 10*3/uL — ABNORMAL HIGH (ref 4.0–10.5)

## 2017-10-23 LAB — BASIC METABOLIC PANEL
ANION GAP: 8 (ref 5–15)
BUN: 43 mg/dL — ABNORMAL HIGH (ref 6–20)
CALCIUM: 7.9 mg/dL — AB (ref 8.9–10.3)
CHLORIDE: 108 mmol/L (ref 101–111)
CO2: 21 mmol/L — AB (ref 22–32)
CREATININE: 1.11 mg/dL — AB (ref 0.44–1.00)
GFR calc non Af Amer: 52 mL/min — ABNORMAL LOW (ref >60)
GLUCOSE: 141 mg/dL — AB (ref 65–99)
Potassium: 3.9 mmol/L (ref 3.5–5.1)
Sodium: 137 mmol/L (ref 135–145)

## 2017-10-23 LAB — URINE CULTURE

## 2017-10-23 LAB — GLUCOSE, CAPILLARY
Glucose-Capillary: 115 mg/dL — ABNORMAL HIGH (ref 65–99)
Glucose-Capillary: 102 mg/dL — ABNORMAL HIGH (ref 65–99)
Glucose-Capillary: 189 mg/dL — ABNORMAL HIGH (ref 65–99)

## 2017-10-23 MED ORDER — CEPHALEXIN 500 MG PO CAPS: 500.0000 mg | ORAL_CAPSULE | Freq: Three times a day (TID) | ORAL | Status: DC

## 2017-10-23 MED ORDER — AMLODIPINE BESYLATE 10 MG PO TABS: 10.0000 mg | ORAL_TABLET | Freq: Every day | ORAL | 0 refills | Status: DC

## 2017-10-23 MED ORDER — METFORMIN HCL 500 MG PO TABS: 1000.0000 mg | ORAL_TABLET | Freq: Two times a day (BID) | ORAL | Status: DC

## 2017-10-23 MED ORDER — POLYETHYLENE GLYCOL 3350 17 G PO PACK: 17.0000 g | PACK | Freq: Every day | ORAL | 0 refills | Status: DC | PRN

## 2017-10-23 MED ORDER — LOSARTAN POTASSIUM 25 MG PO TABS: 25.0000 mg | ORAL_TABLET | Freq: Every day | ORAL | 0 refills | Status: DC

## 2017-10-23 MED ORDER — METFORMIN HCL 1000 MG PO TABS: 1000.0000 mg | ORAL_TABLET | Freq: Two times a day (BID) | ORAL | 0 refills | Status: DC

## 2017-10-23 MED ORDER — CEPHALEXIN 500 MG PO CAPS: 500.0000 mg | ORAL_CAPSULE | Freq: Three times a day (TID) | ORAL | 0 refills | Status: DC

## 2017-10-23 MED ORDER — GLIPIZIDE ER 5 MG PO TB24: 5.0000 mg | ORAL_TABLET | Freq: Every day | ORAL | Status: DC

## 2017-10-23 MED ORDER — POLYETHYLENE GLYCOL 3350 17 G PO PACK: 17.0000 g | PACK | Freq: Every day | ORAL | Status: DC

## 2017-10-23 MED ORDER — BLOOD GLUCOSE METER KIT: PACK | 0 refills | Status: DC

## 2017-10-23 MED ORDER — ATORVASTATIN CALCIUM 40 MG PO TABS: 40.0000 mg | ORAL_TABLET | Freq: Every day | ORAL | 0 refills | Status: DC

## 2017-10-23 MED ORDER — CARVEDILOL 6.25 MG PO TABS: 6.2500 mg | ORAL_TABLET | Freq: Two times a day (BID) | ORAL | 0 refills | Status: DC

## 2017-10-23 MED ORDER — GLIPIZIDE ER 5 MG PO TB24: 5.0000 mg | ORAL_TABLET | Freq: Every day | ORAL | 0 refills | Status: DC

## 2017-10-23 MED ORDER — ASPIRIN EC 81 MG PO TBEC: 81.0000 mg | DELAYED_RELEASE_TABLET | Freq: Every day | ORAL | 0 refills | Status: AC

## 2017-10-23 MED ORDER — LOSARTAN POTASSIUM 25 MG PO TABS
25.0000 mg | ORAL_TABLET | Freq: Every day | ORAL | Status: DC
  Administered 2017-10-23: 25 mg via ORAL
  Filled 2017-10-23: qty 1

## 2017-10-23 NOTE — Progress Notes (Signed)
OT Cancellation Note and Discharge  Patient Details Name: Beverly Martin MRN: [id number] DOB: 04-16-1956   Cancelled Treatment:    Reason Eval/Treat Not Completed: OT screened, no needs identified, will sign off. Noted pt independent with PT yesterday ambulating 1200 feet. PT left rehab sticky note and I texted with PT--pt without any identified skill OT needs.   Almon Register [phone] 10/23/2017, 8:03 AM

## 2017-10-23 NOTE — Discharge Summary (Signed)
Physician Discharge Summary  LISA-MARIE RUEGER ZOX:[id number] DOB: 1956-05-07 DOA: 10/20/2017  PCP: Patient, No Pcp Per  Admit date: 10/20/2017 Discharge date: 10/23/2017  Admitted From: Home Disposition:  Home  Recommendations for Outpatient Follow-up:  1) Follow up as an outpatient with primary care in the given appointment date. 2) Check CBC, BMP in a week.  Check hemoglobin A1c in 3 months.  Check lipid panel in 6-8 weeks 3) Check your blood sugars at home. 4)Follow up with diabetic management as an outpatient. 5)Take prescribed medication as instructed.    Discharge Condition: Stable CODE STATUS: Full Diet recommendation:  Carb Modified   Brief/Interim Summary:   Patient is a 62 year old female with past medical history of hypertension, diabetes mellitus, coronary artery disease, status post CABG, medical noncompliance who presented to the emergency department with nausea, right flank pain and increased urinary frequency. Patient was found to have  urinary tract infection .  There was also concern for possible CHF.  Patient also had mild elevation of troponin on presentation.  Cardiology consulted. Patient's blood culture and urine culture grew Klebsiella pneumoniae.  She was started on IV antibiotics.  Her echocardiogram showed normal heart function.  Patient was also found to have pretty uncontrolled diabetes mellitus.  She was started on insulin during hospitalization.  Patient remains reluctant on being on insulin on  discharge despite repeated counseling.  She was finally discharged on metformin and glipizide. Outpatient follow-up has been arranged for her.  Following problems were addressed during hospitalization:   Sepsis due to urinary tract infection: Presented with leukocytosis and lactic acidosis.  Lactic acidosis has resolved.  Patient is hemodynamically stable.   Started on ceftriaxone. Blood culture growing Klebsiella ammonia which is pretty plan sensitive. She has been  discharged on Keflex.   Uncontrolled diabetes mellitus: Not taking medications for 3 years.  Hemoglobin A1c more than 11.  Blood sugar 544 on presentation. Started on Lantus and sliding scale insulin. Patient reluctant to be discharged on insulin.  Metformin and  glipizide started . Diabetic education provided. Patient should follow-up closely with PCP as an outpatient. Should monitor her blood glucose at home.  Appropriate equipment have been prescribed.  History of coronary artery disease/elevated troponin: Elevated troponin of 0.09 on presentation.  She is status post CABG.  Currently denies any chest pain.   Cardiology was consulted.  Not plan for any intervention.  Echocardiogram normal .Lipitor is started.  Aspirin will be continued.  Also started on carvedilol.  Hypertension: On amlodipine,losartan and carvedilol.  Could not be started on ACE inhibitors/ARB due to acute kidney injury.  Acute kidney injury: Started on IV fluids.  Improved  Positive blood culture: Blood cultures and urine culture growing Klebsiella pneumonia.  Sensitive to cephalosporins.  Discharged on Keflex.     Discharge Diagnoses:  Principal Problem:   UTI (urinary tract infection) Active Problems:   Essential hypertension   Type 2 diabetes mellitus with hyperlipidemia (HCC)   CAD (coronary artery disease)   Sepsis (HCC)   AKI (acute kidney injury) ([place])   Elevated troponin   Positive blood culture    Discharge Instructions  Discharge Instructions    Diet Carb Modified   Complete by:  As directed    Discharge instructions   Complete by:  As directed    1) Follow up as an outpatient with primary care in the given appointment date. 2) Check CBC, BMP in a week.  Check hemoglobin A1c in 3 months.  Check lipid panel in  6-8 weeks 3) Check your blood sugars at home. 4)Follow up with diabetic management as an outpatient. 5)Take prescribed medication as instructed.   Increase activity slowly    Complete by:  As directed      Allergies as of 10/23/2017   No Known Allergies     Medication List    STOP taking these medications   NYQUIL COLD & FLU PO     TAKE these medications   amLODipine 10 MG tablet Commonly known as:  NORVASC Take 1 tablet (10 mg total) by mouth daily. Start taking on:  10/24/2017   aspirin EC 81 MG tablet Take 1 tablet (81 mg total) by mouth daily.   atorvastatin 40 MG tablet Commonly known as:  LIPITOR Take 1 tablet (40 mg total) by mouth daily at 6 PM.   blood glucose meter kit and supplies Dispense based on patient and insurance preference. Use up to four times daily as directed. (FOR ICD-10 E10.9, E11.9).   carvedilol 6.25 MG tablet Commonly known as:  COREG Take 1 tablet (6.25 mg total) by mouth 2 (two) times daily with a meal.   cephALEXin 500 MG capsule Commonly known as:  KEFLEX Take 1 capsule (500 mg total) by mouth every 8 (eight) hours.   glipiZIDE 5 MG 24 hr tablet Commonly known as:  GLUCOTROL XL Take 1 tablet (5 mg total) by mouth daily with breakfast. Start taking on:  10/24/2017   losartan 25 MG tablet Commonly known as:  COZAAR Take 1 tablet (25 mg total) by mouth daily. Start taking on:  10/24/2017   metFORMIN 1000 MG tablet Commonly known as:  GLUCOPHAGE Take 1 tablet (1,000 mg total) by mouth 2 (two) times daily with a meal.   polyethylene glycol packet Commonly known as:  MIRALAX / GLYCOLAX Take 17 g by mouth daily as needed for moderate constipation.      Follow-up Information    Billie Ruddy, MD. Go on 11/04/2017.   Specialty:  Family Medicine Why:  Establish Care visit 8:30 am on November 04, 2017. Hospital follow-up visit November 09, 2017 at 1:30 pm.    Contact information: [place] [id number] [phone]          No Known Allergies  Consultations:  Cardiology   Procedures/Studies: Dg Chest 2 View  Result Date: 10/20/2017 CLINICAL DATA:  Fever and cough. EXAM:  CHEST  2 VIEW COMPARISON:  2/4/6 FINDINGS: Previous median sternotomy and CABG procedure. Small bilateral pleural effusions and mild interstitial edema identified. No pneumothorax identified. IMPRESSION: 1. Mild pulmonary edema and pleural effusions. 2. No pneumothorax after removal of chest tube. Electronically Signed   By: Kerby Moors M.D.   On: 10/20/2017 13:50   Echo   Subjective: Patient seen and examined the bedside this morning.  Remains comfortable.  No new issues/events.  Ready to go home  Discharge Exam: Vitals:   10/22/17 2030 10/23/17 0443  BP: (!) 145/60 (!) 148/57  Pulse: 81 71  Resp: 20 20  Temp: 98.4 F (36.9 C) 98 F (36.7 C)  SpO2: 96% 100%   Vitals:   10/22/17 0749 10/22/17 1508 10/22/17 2030 10/23/17 0443  BP: (!) 159/62 121/76 (!) 145/60 (!) 148/57  Pulse: 76 82 81 71  Resp: _0 Temp: 98.2 F (36.8 C) 98.2 F (36.8 C) 98.4 F (36.9 C) 98 F (36.7 C)  TempSrc: Oral Oral Oral Oral  SpO2: 96% 95% 96% 100%  Weight:  66 kg (145 lb 8 oz)  Height:        General: Pt is alert, awake, not in acute distress Cardiovascular: RRR, S1/S2 +, no rubs, no gallops Respiratory: CTA bilaterally, no wheezing, no rhonchi Abdominal: Soft, NT, ND, bowel sounds + Extremities: no edema, no cyanosis    The results of significant diagnostics from this hospitalization (including imaging, microbiology, ancillary and laboratory) are listed below for reference.     Microbiology: Recent Results (from the past 240 hour(s))  Urine Culture     Status: Abnormal   Collection Time: 10/20/17  5:14 PM  Result Value Ref Range Status   Specimen Description URINE, RANDOM  Final   Special Requests NONE  Final   Culture >=100,000 COLONIES/mL KLEBSIELLA PNEUMONIAE (A)  Final   Report Status 10/23/2017 FINAL  Final   Organism ID, Bacteria KLEBSIELLA PNEUMONIAE (A)  Final      Susceptibility   Klebsiella pneumoniae - MIC*    AMPICILLIN RESISTANT Resistant     CEFAZOLIN  <=4 SENSITIVE Sensitive     CEFTRIAXONE <=1 SENSITIVE Sensitive     CIPROFLOXACIN <=0.25 SENSITIVE Sensitive     GENTAMICIN <=1 SENSITIVE Sensitive     IMIPENEM <=0.25 SENSITIVE Sensitive     NITROFURANTOIN 64 INTERMEDIATE Intermediate     TRIMETH/SULFA <=20 SENSITIVE Sensitive     AMPICILLIN/SULBACTAM <=2 SENSITIVE Sensitive     PIP/TAZO <=4 SENSITIVE Sensitive     Extended ESBL NEGATIVE Sensitive     * >=100,000 COLONIES/mL KLEBSIELLA PNEUMONIAE  Blood culture (routine x 2)     Status: Abnormal   Collection Time: 10/20/17  8:00 PM  Result Value Ref Range Status   Specimen Description BLOOD RIGHT HAND  Final   Special Requests   Final    BOTTLES DRAWN AEROBIC AND ANAEROBIC Blood Culture adequate volume   Culture  Setup Time   Final    GRAM NEGATIVE RODS IN BOTH AEROBIC AND ANAEROBIC BOTTLES CRITICAL RESULT CALLED TO, READ BACK BY AND VERIFIED WITH: N. BATCHELDER, RPHARMD AT 1240 ON 10/21/17 BY C. JESSUP, MLT.     Culture KLEBSIELLA PNEUMONIAE (A)  Final   Report Status 10/23/2017 FINAL  Final   Organism ID, Bacteria KLEBSIELLA PNEUMONIAE  Final      Susceptibility   Klebsiella pneumoniae - MIC*    AMPICILLIN RESISTANT Resistant     CEFAZOLIN <=4 SENSITIVE Sensitive     CEFEPIME <=1 SENSITIVE Sensitive     CEFTAZIDIME <=1 SENSITIVE Sensitive     CEFTRIAXONE <=1 SENSITIVE Sensitive     CIPROFLOXACIN <=0.25 SENSITIVE Sensitive     GENTAMICIN <=1 SENSITIVE Sensitive     IMIPENEM <=0.25 SENSITIVE Sensitive     TRIMETH/SULFA <=20 SENSITIVE Sensitive     AMPICILLIN/SULBACTAM <=2 SENSITIVE Sensitive     PIP/TAZO <=4 SENSITIVE Sensitive     Extended ESBL NEGATIVE Sensitive     * KLEBSIELLA PNEUMONIAE  Blood Culture ID Panel (Reflexed)     Status: Abnormal   Collection Time: 10/20/17  8:00 PM  Result Value Ref Range Status   Enterococcus species NOT DETECTED NOT DETECTED Final   Listeria monocytogenes NOT DETECTED NOT DETECTED Final   Staphylococcus species NOT DETECTED NOT DETECTED  Final   Staphylococcus aureus NOT DETECTED NOT DETECTED Final   Streptococcus species NOT DETECTED NOT DETECTED Final   Streptococcus agalactiae NOT DETECTED NOT DETECTED Final   Streptococcus pneumoniae NOT DETECTED NOT DETECTED Final   Streptococcus pyogenes NOT DETECTED NOT DETECTED Final   Acinetobacter baumannii NOT DETECTED   NOT DETECTED Final   Enterobacteriaceae species DETECTED (A) NOT DETECTED Final    Comment: Enterobacteriaceae represent a large family of gram-negative bacteria, not a single organism. CRITICAL RESULT CALLED TO, READ BACK BY AND VERIFIED WITH: N. BATCHELDER, RPHARMD AT 1240 ON 10/21/17 BY C. JESSUP, MLT    Enterobacter cloacae complex NOT DETECTED NOT DETECTED Final   Escherichia coli NOT DETECTED NOT DETECTED Final   Klebsiella oxytoca NOT DETECTED NOT DETECTED Final   Klebsiella pneumoniae DETECTED (A) NOT DETECTED Final    Comment: CRITICAL RESULT CALLED TO, READ BACK BY AND VERIFIED WITH: N. BATCHELDER, RPHARMD AT 1240 ON 10/21/17 BY C. JESSUP, MLT    Proteus species NOT DETECTED NOT DETECTED Final   Serratia marcescens NOT DETECTED NOT DETECTED Final   Carbapenem resistance NOT DETECTED NOT DETECTED Final   Haemophilus influenzae NOT DETECTED NOT DETECTED Final   Neisseria meningitidis NOT DETECTED NOT DETECTED Final   Pseudomonas aeruginosa NOT DETECTED NOT DETECTED Final   Candida albicans NOT DETECTED NOT DETECTED Final   Candida glabrata NOT DETECTED NOT DETECTED Final   Candida krusei NOT DETECTED NOT DETECTED Final   Candida parapsilosis NOT DETECTED NOT DETECTED Final   Candida tropicalis NOT DETECTED NOT DETECTED Final  Blood culture (routine x 2)     Status: Abnormal   Collection Time: 10/20/17  8:03 PM  Result Value Ref Range Status   Specimen Description BLOOD LEFT ANTECUBITAL  Final   Special Requests   Final    BOTTLES DRAWN AEROBIC AND ANAEROBIC Blood Culture adequate volume   Culture  Setup Time   Final    GRAM NEGATIVE RODS IN BOTH  AEROBIC AND ANAEROBIC BOTTLES CRITICAL VALUE NOTED.  VALUE IS CONSISTENT WITH PREVIOUSLY REPORTED AND CALLED VALUE.    Culture (A)  Final    KLEBSIELLA PNEUMONIAE SUSCEPTIBILITIES PERFORMED ON PREVIOUS CULTURE WITHIN THE LAST 5 DAYS.    Report Status 10/23/2017 FINAL  Final     Labs: BNP (last 3 results) Recent Labs    10/20/17 1740  BNP 143.3*   Basic Metabolic Panel: Recent Labs  Lab 10/20/17 1135 10/20/17 1301 10/21/17 0739 10/22/17 0642 10/23/17 0416  NA 133* 131* 136 136 137  K 5.1 5.1 4.0 3.8 3.9  CL 96* 91* 100* 104 108  CO2  --  27 25 24 21*  GLUCOSE 535* 544* 330* 211* 141*  BUN 61* 74* 63* 57* 43*  CREATININE 1.90* 1.97* 1.44* 1.21* 1.11*  CALCIUM  --  8.8* 8.0* 7.7* 7.9*   Liver Function Tests: Recent Labs  Lab 10/20/17 1301  AST 16  ALT 19  ALKPHOS 149*  BILITOT 1.2  PROT 7.1  ALBUMIN 2.6*   Recent Labs  Lab 10/20/17 2003  LIPASE 33   No results for input(s): AMMONIA in the last 168 hours. CBC: Recent Labs  Lab 10/20/17 1135 10/20/17 1301 10/21/17 0739 10/22/17 0642 10/23/17 0416  WBC  --  11.6* 12.2* 13.8* 14.3*  NEUTROABS  --  10.1*  --  11.3* 11.8*  HGB 15.0 13.6 11.6* 10.3* 9.9*  HCT 44.0 40.8 34.6* 31.1* 30.4*  MCV  --  87.6 86.9 86.6 87.9  PLT  --  104* 86* 102* 143*   Cardiac Enzymes: Recent Labs  Lab 10/20/17 2003 10/21/17 0139 10/21/17 0739  TROPONINI 0.11* 0.08* 0.09*   BNP: Invalid input(s): POCBNP CBG: Recent Labs  Lab 10/22/17 1121 10/22/17 1648 10/22/17 2054 10/23/17 0547 10/23/17 0735  GLUCAP 202* 325* 188* 115* 102*   D-Dimer No   results for input(s): DDIMER in the last 72 hours. Hgb A1c Recent Labs    10/21/17 0325  HGBA1C 11.6*   Lipid Profile Recent Labs    10/21/17 0325  CHOL 238*  HDL <10*  LDLCALC UNABLE TO CALCULATE IF TRIGLYCERIDE OVER 400 mg/dL  TRIG 630*  CHOLHDL NOT CALCULATED   Thyroid function studies No results for input(s): TSH, T4TOTAL, T3FREE, THYROIDAB in the last 72  hours.  Invalid input(s): FREET3 Anemia work up No results for input(s): VITAMINB12, FOLATE, FERRITIN, TIBC, IRON, RETICCTPCT in the last 72 hours. Urinalysis    Component Value Date/Time   COLORURINE YELLOW 10/20/2017 1714   APPEARANCEUR CLOUDY (A) 10/20/2017 1714   LABSPEC 1.018 10/20/2017 1714   PHURINE 5.0 10/20/2017 1714   GLUCOSEU >=500 (A) 10/20/2017 1714   HGBUR MODERATE (A) 10/20/2017 1714   BILIRUBINUR NEGATIVE 10/20/2017 1714   KETONESUR 5 (A) 10/20/2017 1714   PROTEINUR 30 (A) 10/20/2017 1714   UROBILINOGEN 0.2 10/20/2017 1115   NITRITE NEGATIVE 10/20/2017 1714   LEUKOCYTESUR MODERATE (A) 10/20/2017 1714   Sepsis Labs Invalid input(s): PROCALCITONIN,  WBC,  LACTICIDVEN Microbiology Recent Results (from the past 240 hour(s))  Urine Culture     Status: Abnormal   Collection Time: 10/20/17  5:14 PM  Result Value Ref Range Status   Specimen Description URINE, RANDOM  Final   Special Requests NONE  Final   Culture >=100,000 COLONIES/mL KLEBSIELLA PNEUMONIAE (A)  Final   Report Status 10/23/2017 FINAL  Final   Organism ID, Bacteria KLEBSIELLA PNEUMONIAE (A)  Final      Susceptibility   Klebsiella pneumoniae - MIC*    AMPICILLIN RESISTANT Resistant     CEFAZOLIN <=4 SENSITIVE Sensitive     CEFTRIAXONE <=1 SENSITIVE Sensitive     CIPROFLOXACIN <=0.25 SENSITIVE Sensitive     GENTAMICIN <=1 SENSITIVE Sensitive     IMIPENEM <=0.25 SENSITIVE Sensitive     NITROFURANTOIN 64 INTERMEDIATE Intermediate     TRIMETH/SULFA <=20 SENSITIVE Sensitive     AMPICILLIN/SULBACTAM <=2 SENSITIVE Sensitive     PIP/TAZO <=4 SENSITIVE Sensitive     Extended ESBL NEGATIVE Sensitive     * >=100,000 COLONIES/mL KLEBSIELLA PNEUMONIAE  Blood culture (routine x 2)     Status: Abnormal   Collection Time: 10/20/17  8:00 PM  Result Value Ref Range Status   Specimen Description BLOOD RIGHT HAND  Final   Special Requests   Final    BOTTLES DRAWN AEROBIC AND ANAEROBIC Blood Culture adequate  volume   Culture  Setup Time   Final    GRAM NEGATIVE RODS IN BOTH AEROBIC AND ANAEROBIC BOTTLES CRITICAL RESULT CALLED TO, READ BACK BY AND VERIFIED WITH: N. BATCHELDER, RPHARMD AT 1240 ON 10/21/17 BY C. JESSUP, MLT.     Culture KLEBSIELLA PNEUMONIAE (A)  Final   Report Status 10/23/2017 FINAL  Final   Organism ID, Bacteria KLEBSIELLA PNEUMONIAE  Final      Susceptibility   Klebsiella pneumoniae - MIC*    AMPICILLIN RESISTANT Resistant     CEFAZOLIN <=4 SENSITIVE Sensitive     CEFEPIME <=1 SENSITIVE Sensitive     CEFTAZIDIME <=1 SENSITIVE Sensitive     CEFTRIAXONE <=1 SENSITIVE Sensitive     CIPROFLOXACIN <=0.25 SENSITIVE Sensitive     GENTAMICIN <=1 SENSITIVE Sensitive     IMIPENEM <=0.25 SENSITIVE Sensitive     TRIMETH/SULFA <=20 SENSITIVE Sensitive     AMPICILLIN/SULBACTAM <=2 SENSITIVE Sensitive     PIP/TAZO <=4 SENSITIVE Sensitive     Extended  ESBL NEGATIVE Sensitive     * KLEBSIELLA PNEUMONIAE  Blood Culture ID Panel (Reflexed)     Status: Abnormal   Collection Time: 10/20/17  8:00 PM  Result Value Ref Range Status   Enterococcus species NOT DETECTED NOT DETECTED Final   Listeria monocytogenes NOT DETECTED NOT DETECTED Final   Staphylococcus species NOT DETECTED NOT DETECTED Final   Staphylococcus aureus NOT DETECTED NOT DETECTED Final   Streptococcus species NOT DETECTED NOT DETECTED Final   Streptococcus agalactiae NOT DETECTED NOT DETECTED Final   Streptococcus pneumoniae NOT DETECTED NOT DETECTED Final   Streptococcus pyogenes NOT DETECTED NOT DETECTED Final   Acinetobacter baumannii NOT DETECTED NOT DETECTED Final   Enterobacteriaceae species DETECTED (A) NOT DETECTED Final    Comment: Enterobacteriaceae represent a large family of gram-negative bacteria, not a single organism. CRITICAL RESULT CALLED TO, READ BACK BY AND VERIFIED WITH: N. BATCHELDER, RPHARMD AT 0272 ON 10/21/17 BY C. JESSUP, MLT    Enterobacter cloacae complex NOT DETECTED NOT DETECTED Final    Escherichia coli NOT DETECTED NOT DETECTED Final   Klebsiella oxytoca NOT DETECTED NOT DETECTED Final   Klebsiella pneumoniae DETECTED (A) NOT DETECTED Final    Comment: CRITICAL RESULT CALLED TO, READ BACK BY AND VERIFIED WITH: N. BATCHELDER, RPHARMD AT 1240 ON 10/21/17 BY C. JESSUP, MLT    Proteus species NOT DETECTED NOT DETECTED Final   Serratia marcescens NOT DETECTED NOT DETECTED Final   Carbapenem resistance NOT DETECTED NOT DETECTED Final   Haemophilus influenzae NOT DETECTED NOT DETECTED Final   Neisseria meningitidis NOT DETECTED NOT DETECTED Final   Pseudomonas aeruginosa NOT DETECTED NOT DETECTED Final   Candida albicans NOT DETECTED NOT DETECTED Final   Candida glabrata NOT DETECTED NOT DETECTED Final   Candida krusei NOT DETECTED NOT DETECTED Final   Candida parapsilosis NOT DETECTED NOT DETECTED Final   Candida tropicalis NOT DETECTED NOT DETECTED Final  Blood culture (routine x 2)     Status: Abnormal   Collection Time: 10/20/17  8:03 PM  Result Value Ref Range Status   Specimen Description BLOOD LEFT ANTECUBITAL  Final   Special Requests   Final    BOTTLES DRAWN AEROBIC AND ANAEROBIC Blood Culture adequate volume   Culture  Setup Time   Final    GRAM NEGATIVE RODS IN BOTH AEROBIC AND ANAEROBIC BOTTLES CRITICAL VALUE NOTED.  VALUE IS CONSISTENT WITH PREVIOUSLY REPORTED AND CALLED VALUE.    Culture (A)  Final    KLEBSIELLA PNEUMONIAE SUSCEPTIBILITIES PERFORMED ON PREVIOUS CULTURE WITHIN THE LAST 5 DAYS.    Report Status 10/23/2017 FINAL  Final     Time coordinating discharge: Over 30 minutes  SIGNED:   Marene Lenz, MD  [facility] 10/23/2017, 11:08 AM Pager [phone]  If 7PM-7AM, please contact night-coverage [email or web address] Password [email or web address]

## 2017-10-23 NOTE — Progress Notes (Signed)
Progress Note  Patient Name: Beverly Martin Date of Encounter: 10/23/2017  Primary Cardiologist: Dr. Acie Fredrickson (new)  Subjective   Feeling much better.  Denies chest pain or shortness of breath.   Inpatient Medications    Scheduled Meds: . amLODipine  10 mg Oral Daily  . aspirin EC  325 mg Oral Daily  . atorvastatin  40 mg Oral q1800  . carvedilol  6.25 mg Oral BID WC  . enoxaparin (LOVENOX) injection  40 mg Subcutaneous Daily  . insulin aspart  0-9 Units Subcutaneous TID WC  . insulin glargine  25 Units Subcutaneous QHS  . insulin starter kit- pen needles  1 kit Other Once  . insulin starter kit- syringes  1 kit Other Once  . living well with diabetes book   Does not apply Once  . losartan  25 mg Oral Daily  . polyethylene glycol  17 g Oral Daily   Continuous Infusions: . sodium chloride 75 mL/hr at 10/23/17 0859  . cefTRIAXone (ROCEPHIN)  IV 2 g (10/22/17 1714)   PRN Meds: acetaminophen **OR** acetaminophen, hydrALAZINE, morphine injection, nitroGLYCERIN, ondansetron (ZOFRAN) IV, zolpidem   Vital Signs    Vitals:   10/22/17 0749 10/22/17 1508 10/22/17 2030 10/23/17 0443  BP: (!) 159/62 121/76 (!) 145/60 (!) 148/57  Pulse: 76 82 81 71  Resp: _0 Temp: 98.2 F (36.8 C) 98.2 F (36.8 C) 98.4 F (36.9 C) 98 F (36.7 C)  TempSrc: Oral Oral Oral Oral  SpO2: 96% 95% 96% 100%  Weight:    145 lb 8 oz (66 kg)  Height:        Intake/Output Summary (Last 24 hours) at 10/23/2017 0940 Last data filed at 10/23/2017 0609 Gross per 24 hour  Intake 3961.67 ml  Output 2200 ml  Net 1761.67 ml   Filed Weights   10/21/17 1728 10/22/17 0403 10/23/17 0443  Weight: 137 lb 3.2 oz (62.2 kg) 138 lb 12.8 oz (63 kg) 145 lb 8 oz (66 kg)    Telemetry    Sinus rhythm.  No events.  - Personally Reviewed  ECG    n/a - Personally Reviewed  Physical Exam   VS:  BP (!) 148/57 (BP Location: Left Arm)   Pulse 71   Temp 98 F (36.7 C) (Oral)   Resp 20   Ht _1  (1.6 m)    Wt 145 lb 8 oz (66 kg)   SpO2 100%   BMI 25.77 kg/m  , BMI Body mass index is 25.77 kg/m. GENERAL:  Well appearing HEENT: Pupils equal round and reactive, fundi not visualized, oral mucosa unremarkable NECK:  No jugular venous distention, waveform within normal limits, carotid upstroke brisk and symmetric, no bruits, no thyromegaly LUNGS:  Clear to auscultation bilaterally HEART:  RRR.  PMI not displaced or sustained,S1 and S2 within normal limits, no S3, no S4, no clicks, no rubs, no murmurs ABD:  Flat, positive bowel sounds normal in frequency in pitch, no bruits, no rebound, no guarding, no midline pulsatile mass, no hepatomegaly, no splenomegaly EXT:  2 plus pulses throughout, no edema, no cyanosis no clubbing SKIN:  No rashes no nodules NEURO:  Cranial nerves II through XII grossly intact, motor grossly intact throughout [facility]:  Cognitively intact, oriented to person place and time   Labs    Chemistry Recent Labs  Lab 10/20/17 1301 10/21/17 0739 10/22/17 0642 10/23/17 0416  NA 131* 136 136 137  K 5.1 4.0 3.8 3.9  CL  91* 100* 104 108  CO2 _0 21*  GLUCOSE 544* 330* 211* 141*  BUN 74* 63* 57* 43*  CREATININE 1.97* 1.44* 1.21* 1.11*  CALCIUM 8.8* 8.0* 7.7* 7.9*  PROT 7.1  --   --   --   ALBUMIN 2.6*  --   --   --   AST 16  --   --   --   ALT 19  --   --   --   ALKPHOS 149*  --   --   --   BILITOT 1.2  --   --   --   GFRNONAA 26* 38* 47* 52*  GFRAA 30* 44* 54* >60  ANIONGAP _1 Hematology Recent Labs  Lab 10/21/17 0739 10/22/17 0642 10/23/17 0416  WBC 12.2* 13.8* 14.3*  RBC 3.98 3.59* 3.46*  HGB 11.6* 10.3* 9.9*  HCT 34.6* 31.1* 30.4*  MCV 86.9 86.6 87.9  MCH 29.1 28.7 28.6  MCHC 33.5 33.1 32.6  RDW 13.3 13.5 13.7  PLT 86* 102* 143*    Cardiac Enzymes Recent Labs  Lab 10/20/17 2003 10/21/17 0139 10/21/17 0739  TROPONINI 0.11* 0.08* 0.09*    Recent Labs  Lab 10/20/17 1753  TROPIPOC 0.09*     BNP Recent Labs  Lab  10/20/17 1740  BNP 143.3*     DDimer No results for input(s): DDIMER in the last 168 hours.   Radiology    No results found.  Cardiac Studies   Echo 10/21/17: Study Conclusions  - Left ventricle: Inferior basal hypokinesis. The cavity size was   normal. Systolic function was normal. The estimated ejection   fraction was in the range of 50% to 55%. Wall motion was normal;   there were no regional wall motion abnormalities. Left   ventricular diastolic function parameters were normal. - Atrial septum: No defect or patent foramen ovale was identified.  Patient Profile     62 y.o. female with CAD s/p CABG, diabetes, hypertension and hyperlipidemia off all medications for 3 years admitted for sepsis from a urinary source.   Assessment & Plan    # CAD s/p CABG:  # Elevated troponin: Troponin mildly elevated.  Likely 2/2 sepsis.  She has no chest pain.  Very mild hypokinesis of the basal inferior myocardium.  Systolic function preserved.  No further ischemia evaluation at this time.  Continue aspirin and atorvastatin.  Atorvastatin was started this admission.  She will need lipids checked in 6-8 weeks.   # Hypertensive urgency: BP is better but not at goal.  She has been off all meds for years. Continue amlodipine and carvedilol.  Renal function is much better today.  Given her diabetes we will start losartan 32m daily.  She will need a BMP in one week.   For questions or updates, please contact CSnyderPlease consult [email or web address] for contact info under Cardiology/STEMI.      Signed, TSkeet Latch MD  10/23/2017, 9:40 AM

## 2017-10-26 ENCOUNTER — Encounter: Payer: Self-pay | Admitting: *Deleted

## 2017-10-26 ENCOUNTER — Ambulatory Visit: Payer: 59 | Admitting: Family Medicine

## 2017-10-26 ENCOUNTER — Encounter: Payer: Self-pay | Admitting: Family Medicine

## 2017-10-26 VITALS — BP 140/84 | HR 74 | Temp 98.0°F | Resp 16 | Ht 63.0 in | Wt 150.4 lb

## 2017-10-26 DIAGNOSIS — E785 Hyperlipidemia, unspecified: Secondary | ICD-10-CM | POA: Diagnosis not present

## 2017-10-26 DIAGNOSIS — I10 Essential (primary) hypertension: Secondary | ICD-10-CM

## 2017-10-26 DIAGNOSIS — R55 Syncope and collapse: Secondary | ICD-10-CM | POA: Diagnosis not present

## 2017-10-26 DIAGNOSIS — S8990XA Unspecified injury of unspecified lower leg, initial encounter: Secondary | ICD-10-CM | POA: Diagnosis not present

## 2017-10-26 DIAGNOSIS — E1169 Type 2 diabetes mellitus with other specified complication: Secondary | ICD-10-CM | POA: Diagnosis not present

## 2017-10-26 DIAGNOSIS — N3000 Acute cystitis without hematuria: Secondary | ICD-10-CM | POA: Diagnosis not present

## 2017-10-26 NOTE — Patient Instructions (Addendum)
  [name] I have seen you today for an acute visit.  A few things to remember from today's visit:   Type 2 diabetes mellitus with hyperlipidemia ([place])  Near syncope - Plan: Basic metabolic panel, CBC with Differential/Platelet  Knee injury, unspecified laterality, initial encounter   Medications prescribed today are intended for short period of time and will not be refill upon request, a follow up appointment might be necessary to discuss continuation of of treatment if appropriate.   Stop Glucotrol Continue metformin Continue antibiotics. Continue checking blood sugars Avoid skipping meals Keep appointment with your PCP next week   In general please monitor for signs of worsening symptoms and seek immediate medical attention if any concerning.    I hope you get better soon!

## 2017-10-26 NOTE — Progress Notes (Signed)
ACUTE VISIT   HPI:  Chief Complaint  Patient presents with  . Fall    fell earlier today  . Fatigue    blood sugar 93    Beverly Martin is a 62 y.o. female  with Hx of DM II,HTN, CAD (S/P CABG),and HLD, who is is here today with her husband complaining of fall today around noon while she was at [facility]. She states that suddenly she felt like her legs could not hold her and started falling to the floor. She landed on her knees,deneis head trauma or LOC. Customers helped her get up. She has mild soreness on anterior aspect of knees but no limitation of ROM,edema,or deformity.   She was recently discharged from the hospital, stay from 10/20/2017 to 10/23/2017. She is currently on oral antibiotics to treat UTI,Cephalexin. Tolerating medication well. Denies fever,chills, abdominal pain, dysuria,increased urinary frequency, gross hematuria,or decreased urine output.   She denies associated headache, chest pain, dyspnea, palpitations, numbness, tingling, or MS changes.   DM 2, for about 3 years she was not taking her medications due to lack of health insurance. BS's since hospital discharge 70's-120's (FG) An hour after a meal 100-120's. She is currently on Glucotrol XL 5 mg daily. She is not taking Glucophage 1000 mg bid because she has had hypoglycemia when she takes both medications.  Lab Results  Component Value Date   HGBA1C 11.6 (H) 10/21/2017   HTN:  She is not checking BP at home. Currently she is on Coreg 6.25 mg twice daily, Amlodipine 10 mg daily, and Cozaar 25 mg daily. Last eye exam a few years ago.   Lab Results  Component Value Date   CREATININE 1.11 (H) 10/23/2017   BUN 43 (H) 10/23/2017   NA 137 10/23/2017   K 3.9 10/23/2017   CL 108 10/23/2017   CO2 21 (L) 10/23/2017    She has not noted gum/nose bleed,melena,or blood in stool.   Lab Results  Component Value Date   WBC 14.3 (H) 10/23/2017   HGB 9.9 (L) 10/23/2017   HCT 30.4 (L)  10/23/2017   MCV 87.9 10/23/2017   PLT 143 (L) 10/23/2017     Review of Systems  Constitutional: Positive for fatigue. Negative for activity change, appetite change, fever and unexpected weight change.  HENT: Negative for mouth sores, nosebleeds and trouble swallowing.   Eyes: Negative for redness and visual disturbance.  Respiratory: Negative for cough, shortness of breath and wheezing.   Cardiovascular: Negative for chest pain, palpitations and leg swelling.  Gastrointestinal: Negative for abdominal pain, nausea and vomiting.       Negative for changes in bowel habits.  Endocrine: Negative for cold intolerance, heat intolerance, polydipsia, polyphagia and polyuria.  Genitourinary: Negative for decreased urine volume, dysuria and hematuria.  Musculoskeletal: Positive for gait problem.  Skin: Positive for pallor. Negative for rash and wound.  Neurological: Negative for seizures, syncope, weakness, numbness and headaches.  Psychiatric/Behavioral: Negative for confusion. The patient is not nervous/anxious.       Current Outpatient Medications on File Prior to Visit  Medication Sig Dispense Refill  . amLODipine (NORVASC) 10 MG tablet Take 1 tablet (10 mg total) by mouth daily. 30 tablet 0  . aspirin EC 81 MG tablet Take 1 tablet (81 mg total) by mouth daily. 30 tablet 0  . atorvastatin (LIPITOR) 40 MG tablet Take 1 tablet (40 mg total) by mouth daily at 6 PM. 30 tablet 0  . blood glucose meter  kit and supplies Dispense based on patient and insurance preference. Use up to four times daily as directed. (FOR ICD-10 E10.9, E11.9). 1 each 0  . carvedilol (COREG) 6.25 MG tablet Take 1 tablet (6.25 mg total) by mouth 2 (two) times daily with a meal. 60 tablet 0  . cephALEXin (KEFLEX) 500 MG capsule Take 1 capsule (500 mg total) by mouth every 8 (eight) hours. 20 capsule 0  . losartan (COZAAR) 25 MG tablet Take 1 tablet (25 mg total) by mouth daily. 30 tablet 0  . metFORMIN (GLUCOPHAGE) 1000 MG  tablet Take 1 tablet (1,000 mg total) by mouth 2 (two) times daily with a meal. 60 tablet 0  . polyethylene glycol (MIRALAX / GLYCOLAX) packet Take 17 g by mouth daily as needed for moderate constipation. 7 each 0   No current facility-administered medications on file prior to visit.      Past Medical History:  Diagnosis Date  . CAD (coronary artery disease)   . Diabetes mellitus without complication ([place])   . Essential hypertension   . Hyperlipidemia    No Known Allergies  Social History   Socioeconomic History  . Marital status: Married    Spouse name: None  . Number of children: None  . Years of education: None  . Highest education level: None  Social Needs  . Financial resource strain: None  . Food insecurity - worry: None  . Food insecurity - inability: None  . Transportation needs - medical: None  . Transportation needs - non-medical: None  Occupational History  . None  Tobacco Use  . Smoking status: Never Smoker  . Smokeless tobacco: Never Used  Substance and Sexual Activity  . Alcohol use: No    Frequency: Never  . Drug use: No  . Sexual activity: No  Other Topics Concern  . None  Social History Narrative  . None    Vitals:   10/26/17 1419  BP: 140/84  Pulse: 74  Resp: 16  Temp: 98 F (36.7 C)  SpO2: 98%   Body mass index is 26.64 kg/m.   Physical Exam  Nursing note and vitals reviewed. Constitutional: She is oriented to person, place, and time. She appears well-developed and well-nourished. No distress.  HENT:  Head: Normocephalic and atraumatic.  Mouth/Throat: Oropharynx is clear and moist and mucous membranes are normal.  Eyes: Conjunctivae are normal. Pupils are equal, round, and reactive to light.  Cardiovascular: Normal rate and regular rhythm.  No murmur heard. Pulses:      Dorsalis pedis pulses are 2+ on the right side, and 2+ on the left side.  Respiratory: Effort normal and breath sounds normal. No respiratory distress.  GI:  Soft. She exhibits no mass. There is no hepatomegaly. There is no tenderness.  Musculoskeletal: She exhibits no edema or tenderness.       Right knee: She exhibits normal range of motion, no effusion and no erythema.       Left knee: She exhibits normal range of motion, no effusion and no erythema.  Lymphadenopathy:    She has no cervical adenopathy.  Neurological: She is alert and oriented to person, place, and time. She has normal strength. No cranial nerve deficit. Gait normal.  Skin: Skin is warm. Abrasion and ecchymosis (LUE s/p vein puncture) noted. No rash noted. No erythema. There is pallor.  Superficial minimal excoriation on the bilateral, clean skin and pants are intact.   Psychiatric: She has a normal mood and affect.  Fairly groomed, good  eye contact.    ASSESSMENT AND PLAN:   Beverly Martin was seen today for fall and fatigue.  Diagnoses and all orders for this visit:  Type 2 diabetes mellitus with hyperlipidemia ([place])  Recommend stopping Glipizide XR 5 mg daily due to frequent BS's < 80. Resume and continue Metformin 1000 mg bid with food. Some side effects discussed. Instructed about warning signs. Continue checking FG and 2 hours post prandial. Keep next appt with PCP. She is due for eye exam.  Near syncope  Possible etiologies discussed. ? Hypoglycemic event. No associated symptoms that suggest cardiac event,so I did not recommend EKG today. Instructed about warning signs. Further recommendations will be given according to lab results.  -     Basic metabolic panel -     CBC with Differential/Platelet  Knee injury, unspecified laterality, initial encounter  Examination does not suggest fracture or dislocation, so no imaging ordered today. Fall precautions discussed.  Essential hypertension  BP slightly above goal. No changes in current management. Continue low salt diet. Try to monitor BP at home. Keep appt with PCP.  Acute cystitis without  hematuria  Asymptomatic. Complete abx treatment.    Return in about 1 week (around 11/02/2017), or if symptoms worsen or fail to improve, for Keep appt next week with PCP.     -Beverly Martin was advised to seek immediate medical attention if sudden worsening symptoms.      Betty G. Martinique, MD  [facility]. [place] office.

## 2017-10-30 ENCOUNTER — Encounter: Payer: Self-pay | Admitting: Cardiovascular Disease

## 2017-11-04 ENCOUNTER — Ambulatory Visit: Payer: 59 | Admitting: Family Medicine

## 2017-11-04 ENCOUNTER — Encounter: Payer: Self-pay | Admitting: Family Medicine

## 2017-11-04 VITALS — BP 122/60 | HR 67 | Temp 97.8°F | Ht 63.0 in | Wt 143.0 lb

## 2017-11-04 DIAGNOSIS — E785 Hyperlipidemia, unspecified: Secondary | ICD-10-CM | POA: Diagnosis not present

## 2017-11-04 DIAGNOSIS — E1169 Type 2 diabetes mellitus with other specified complication: Secondary | ICD-10-CM | POA: Diagnosis not present

## 2017-11-04 DIAGNOSIS — Z7689 Persons encountering health services in other specified circumstances: Secondary | ICD-10-CM | POA: Diagnosis not present

## 2017-11-04 DIAGNOSIS — I10 Essential (primary) hypertension: Secondary | ICD-10-CM

## 2017-11-04 LAB — BASIC METABOLIC PANEL
BUN: 28 mg/dL — AB (ref 6–23)
CALCIUM: 8.9 mg/dL (ref 8.4–10.5)
CO2: 25 mEq/L (ref 19–32)
CREATININE: 1.09 mg/dL (ref 0.40–1.20)
Chloride: 103 mEq/L (ref 96–112)
GFR: 65.29 mL/min (ref >60.00)
Glucose, Bld: 189 mg/dL — ABNORMAL HIGH (ref 70–99)
Potassium: 5 mEq/L (ref 3.5–5.1)
Sodium: 141 mEq/L (ref 135–145)

## 2017-11-04 LAB — CBC WITH DIFFERENTIAL/PLATELET
BASOS ABS: 0.1 10*3/uL (ref 0.0–0.1)
BASOS PCT: 0.8 % (ref 0.0–3.0)
Eosinophils Absolute: 0.1 10*3/uL (ref 0.0–0.7)
Eosinophils Relative: 1.4 % (ref 0.0–5.0)
HEMATOCRIT: 28.7 % — AB (ref 36.0–46.0)
Hemoglobin: 9.8 g/dL — ABNORMAL LOW (ref 12.0–15.0)
LYMPHS PCT: 9 % — AB (ref 12.0–46.0)
Lymphs Abs: 0.8 10*3/uL (ref 0.7–4.0)
MCHC: 34 g/dL (ref 30.0–36.0)
MCV: 88.5 fl (ref 78.0–100.0)
MONO ABS: 0.6 10*3/uL (ref 0.1–1.0)
Monocytes Relative: 6.7 % (ref 3.0–12.0)
NEUTROS ABS: 7.7 10*3/uL (ref 1.4–7.7)
Neutrophils Relative %: 82.1 % — ABNORMAL HIGH (ref 43.0–77.0)
PLATELETS: 399 10*3/uL (ref 150.0–400.0)
RBC: 3.24 Mil/uL — ABNORMAL LOW (ref 3.87–5.11)
RDW: 13.7 % (ref 11.5–15.5)
WBC: 9.4 10*3/uL (ref 4.0–10.5)

## 2017-11-04 LAB — GLUCOSE, POCT (MANUAL RESULT ENTRY): POC GLUCOSE: 215 mg/dL — AB (ref 70–99)

## 2017-11-04 MED ORDER — GLUCOSE BLOOD VI STRP: ORAL_STRIP | 12 refills | Status: DC

## 2017-11-04 NOTE — Patient Instructions (Addendum)
DASH Eating Plan DASH stands for "Dietary Approaches to Stop Hypertension." The DASH eating plan is a healthy eating plan that has been shown to reduce high blood pressure (hypertension). It may also reduce your risk for type 2 diabetes, heart disease, and stroke. The DASH eating plan may also help with weight loss. What are tips for following this plan? General guidelines  Avoid eating more than 2,300 mg (milligrams) of salt (sodium) a day. If you have hypertension, you may need to reduce your sodium intake to 1,500 mg a day.  Limit alcohol intake to no more than 1 drink a day for nonpregnant women and 2 drinks a day for men. One drink equals 12 oz of beer, 5 oz of wine, or 1 oz of hard liquor.  Work with your health care provider to maintain a healthy body weight or to lose weight. Ask what an ideal weight is for you.  Get at least 30 minutes of exercise that causes your heart to beat faster (aerobic exercise) most days of the week. Activities may include walking, swimming, or biking.  Work with your health care provider or diet and nutrition specialist (dietitian) to adjust your eating plan to your individual calorie needs. Reading food labels  Check food labels for the amount of sodium per serving. Choose foods with less than 5 percent of the Daily Value of sodium. Generally, foods with less than 300 mg of sodium per serving fit into this eating plan.  To find whole grains, look for the word "whole" as the first word in the ingredient list. Shopping  Buy products labeled as "low-sodium" or "no salt added."  Buy fresh foods. Avoid canned foods and premade or frozen meals. Cooking  Avoid adding salt when cooking. Use salt-free seasonings or herbs instead of table salt or sea salt. Check with your health care provider or pharmacist before using salt substitutes.  Do not fry foods. Cook foods using healthy methods such as baking, boiling, grilling, and broiling instead.  Cook with  heart-healthy oils, such as olive, canola, soybean, or sunflower oil. Meal planning   Eat a balanced diet that includes: ? 5 or more servings of fruits and vegetables each day. At each meal, try to fill half of your plate with fruits and vegetables. ? Up to 6-8 servings of whole grains each day. ? Less than 6 oz of lean meat, poultry, or fish each day. A 3-oz serving of meat is about the same size as a deck of cards. One egg equals 1 oz. ? 2 servings of low-fat dairy each day. ? A serving of nuts, seeds, or beans 5 times each week. ? Heart-healthy fats. Healthy fats called Omega-3 fatty acids are found in foods such as flaxseeds and coldwater fish, like sardines, salmon, and mackerel.  Limit how much you eat of the following: ? Canned or prepackaged foods. ? Food that is high in trans fat, such as fried foods. ? Food that is high in saturated fat, such as fatty meat. ? Sweets, desserts, sugary drinks, and other foods with added sugar. ? Full-fat dairy products.  Do not salt foods before eating.  Try to eat at least 2 vegetarian meals each week.  Eat more home-cooked food and less restaurant, buffet, and fast food.  When eating at a restaurant, ask that your food be prepared with less salt or no salt, if possible. What foods are recommended? The items listed may not be a complete list. Talk with your dietitian about what   dietary choices are best for you. Grains Whole-grain or whole-wheat bread. Whole-grain or whole-wheat pasta. Brown rice. Oatmeal. Quinoa. Bulgur. Whole-grain and low-sodium cereals. Pita bread. Low-fat, low-sodium crackers. Whole-wheat flour tortillas. Vegetables Fresh or frozen vegetables (raw, steamed, roasted, or grilled). Low-sodium or reduced-sodium tomato and vegetable juice. Low-sodium or reduced-sodium tomato sauce and tomato paste. Low-sodium or reduced-sodium canned vegetables. Fruits All fresh, dried, or frozen fruit. Canned fruit in natural juice (without  added sugar). Meat and other protein foods Skinless chicken or turkey. Ground chicken or turkey. Pork with fat trimmed off. Fish and seafood. Egg whites. Dried beans, peas, or lentils. Unsalted nuts, nut butters, and seeds. Unsalted canned beans. Lean cuts of beef with fat trimmed off. Low-sodium, lean deli meat. Dairy Low-fat (1%) or fat-free (skim) milk. Fat-free, low-fat, or reduced-fat cheeses. Nonfat, low-sodium ricotta or cottage cheese. Low-fat or nonfat yogurt. Low-fat, low-sodium cheese. Fats and oils Soft margarine without trans fats. Vegetable oil. Low-fat, reduced-fat, or light mayonnaise and salad dressings (reduced-sodium). Canola, safflower, olive, soybean, and sunflower oils. Avocado. Seasoning and other foods Herbs. Spices. Seasoning mixes without salt. Unsalted popcorn and pretzels. Fat-free sweets. What foods are not recommended? The items listed may not be a complete list. Talk with your dietitian about what dietary choices are best for you. Grains Baked goods made with fat, such as croissants, muffins, or some breads. Dry pasta or rice meal packs. Vegetables Creamed or fried vegetables. Vegetables in a cheese sauce. Regular canned vegetables (not low-sodium or reduced-sodium). Regular canned tomato sauce and paste (not low-sodium or reduced-sodium). Regular tomato and vegetable juice (not low-sodium or reduced-sodium). Pickles. Olives. Fruits Canned fruit in a light or heavy syrup. Fried fruit. Fruit in cream or butter sauce. Meat and other protein foods Fatty cuts of meat. Ribs. Fried meat. Bacon. Sausage. Bologna and other processed lunch meats. Salami. Fatback. Hotdogs. Bratwurst. Salted nuts and seeds. Canned beans with added salt. Canned or smoked fish. Whole eggs or egg yolks. Chicken or turkey with skin. Dairy Whole or 2% milk, cream, and half-and-half. Whole or full-fat cream cheese. Whole-fat or sweetened yogurt. Full-fat cheese. Nondairy creamers. Whipped toppings.  Processed cheese and cheese spreads. Fats and oils Butter. Stick margarine. Lard. Shortening. Ghee. Bacon fat. Tropical oils, such as coconut, palm kernel, or palm oil. Seasoning and other foods Salted popcorn and pretzels. Onion salt, garlic salt, seasoned salt, table salt, and sea salt. Worcestershire sauce. Tartar sauce. Barbecue sauce. Teriyaki sauce. Soy sauce, including reduced-sodium. Steak sauce. Canned and packaged gravies. Fish sauce. Oyster sauce. Cocktail sauce. Horseradish that you find on the shelf. Ketchup. Mustard. Meat flavorings and tenderizers. Bouillon cubes. Hot sauce and Tabasco sauce. Premade or packaged marinades. Premade or packaged taco seasonings. Relishes. Regular salad dressings. Where to find more information:  National Heart, Lung, and Blood Institute: www.nhlbi.nih.gov  American Heart Association: www.heart.org Summary  The DASH eating plan is a healthy eating plan that has been shown to reduce high blood pressure (hypertension). It may also reduce your risk for type 2 diabetes, heart disease, and stroke.  With the DASH eating plan, you should limit salt (sodium) intake to 2,300 mg a day. If you have hypertension, you may need to reduce your sodium intake to 1,500 mg a day.  When on the DASH eating plan, aim to eat more fresh fruits and vegetables, whole grains, lean proteins, low-fat dairy, and heart-healthy fats.  Work with your health care provider or diet and nutrition specialist (dietitian) to adjust your eating plan to your individual   calorie needs. This information is not intended to replace advice given to you by your health care provider. Make sure you discuss any questions you have with your health care provider. Document Released: 09/18/2011 Document Revised: 09/22/2016 Document Reviewed: 09/22/2016 Elsevier Interactive Patient Education  2018 [place] for Eating Away From Home If You Have Diabetes Controlling your level of blood glucose,  also known as blood sugar, can be challenging. It can be even more difficult when you do not prepare your own meals. The following tips can help you manage your diabetes when you eat away from home. Planning ahead Plan ahead if you know you will be eating away from home:  Ask your health care provider how to time meals and medicine if you are taking insulin.  Make a list of restaurants near you that offer healthy choices. If they have a carry-out menu, take it home and plan what you will order ahead of time.  Look up the restaurant you want to eat at online. Many chain and fast-food restaurants list nutritional information online. Use this information to choose the healthiest options and to calculate how many carbohydrates will be in your meal.  Use a carbohydrate-counting book or mobile app to look up the carbohydrate content and serving size of the foods you want to eat.  Become familiar with serving sizes and learn to recognize how many servings are in a portion. This will allow you to estimate how many carbohydrates you can eat.  Free foods A "free food" is any food or drink that has less than 5 g of carbohydrates per serving. Free foods include:  Many vegetables.  Hard boiled eggs.  Nuts or seeds.  Olives.  Cheeses.  Meats.  These types of foods make good appetizer choices and are often available at salad bars. Lemon juice, vinegar, or a low-calorie salad dressing of fewer than 20 calories per serving can be used as a "free" salad dressing. Choices to reduce carbohydrates  Substitute nonfat sweetened yogurt with a sugar-free yogurt. Yogurt made from soy milk may also be used, but you will still want a sugar-free or plain option to choose a lower carbohydrate amount.  Ask your server to take away the bread basket or chips from your table.  Order fresh fruit. A salad bar often offers fresh fruit choices. Avoid canned fruit because it is usually packed in sugar or  syrup.  Order a salad, and eat it without dressing. Or, create a "free" salad dressing.  Ask for substitutions. For example, instead of [place] fries, request an order of a vegetable such as salad, green beans, or broccoli. Other tips  If you take insulin, take the insulin once your food arrives to your table. This will ensure your insulin and food are timed correctly.  Ask your server about the portion size before your order, and ask for a take-out box if the portion has more servings than you should have. When your food comes, leave the amount you should have on the plate, and put the rest in the take-out box.  Consider splitting an entree with someone and ordering a side salad. This information is not intended to replace advice given to you by your health care provider. Make sure you discuss any questions you have with your health care provider. Document Released: 09/29/2005 Document Revised: 03/06/2016 Document Reviewed: 12/27/2013 Elsevier Interactive Patient Education  [employer].

## 2017-11-04 NOTE — Progress Notes (Signed)
Patient presents to clinic today to f/u on chronic issues and formerly establish care.  Pt is accompanied by her husband.  SUBJECTIVE: PMH: Pt is a  62 yo female with pmh sig for HTN, DM, HLD, CAD.  Pt was recently seen in clinic on 10/26/17 for f/u after hospitalization on 10/20/17-10/23/17 and s/p fall.  While in the hospital pt was dx'd with urosepsis 2/2 Klebsiella pneumoniae UTI.  Pt was treated with IV abx and sent home on Keflex.    At the time of last OFV on 10/26/17 pt was also seen for an episode of weakness/fall while in [place].  The episode was thought 2/2 pt's DM regimen.  Pt was advised to stop taking Glipizide XL 5 mg per chart review.  Pt states she was last seen by her PCP 5 yrs ago.  Pt then lost her insurance and was unable to seek care.  DM: -pt checking fsbs at home.  Typically 70-164.  Pt also checking a few postprandial fsbs. -Some confusion as to pt's current medication regimen.  Per last OFV with Dr. [place] pt was to only take Metformin, but pt states she is taking Metformin 1000 mg BID and glipizide XL 5 mg -Pt has been eating "whatever people bring me" -pt is not exercising. -may drink 1 or 2 glasses of water per day.  Not drinking sodas or sweet tea. -Pt states it is hard for her to eat at regular times each day 2/2 to her schedule at work.  She is suppose to work from 7am-3:30pm but her job at times has the employees work longer. -pt also endorses decreased appetite.  Has to make herself eat.  HTN: -pt not checking bp at home -taking norvasc 10 mg daily, coreg 6.25 mg BID, and losartan 25 mg daily.  HLD: -pt taking lipitor 40 mg  Allergies: NKDA  Past Surg Hx: Triple bypass surgery in 2006.  Social Hx: Pt is married.  She is currently employed by [employer] where she works on [employer].  Pt denies tobacco, alcohol, or drug use.  Family Medical Hx: Mom-DM, HTN Dad- esophageal cancer, tobacco use, alcohol abuse.   Past Medical History:    Diagnosis Date  . CAD (coronary artery disease)   . Diabetes mellitus without complication ([place])   . Essential hypertension   . Hyperlipidemia     Past Surgical History:  Procedure Laterality Date  . CARDIAC SURGERY     CABG    Current Outpatient Medications on File Prior to Visit  Medication Sig Dispense Refill  . amLODipine (NORVASC) 10 MG tablet Take 1 tablet (10 mg total) by mouth daily. 30 tablet 0  . aspirin EC 81 MG tablet Take 1 tablet (81 mg total) by mouth daily. 30 tablet 0  . atorvastatin (LIPITOR) 40 MG tablet Take 1 tablet (40 mg total) by mouth daily at 6 PM. 30 tablet 0  . blood glucose meter kit and supplies Dispense based on patient and insurance preference. Use up to four times daily as directed. (FOR ICD-10 E10.9, E11.9). 1 each 0  . carvedilol (COREG) 6.25 MG tablet Take 1 tablet (6.25 mg total) by mouth 2 (two) times daily with a meal. 60 tablet 0  . cephALEXin (KEFLEX) 500 MG capsule Take 1 capsule (500 mg total) by mouth every 8 (eight) hours. 20 capsule 0  . glipiZIDE (GLUCOTROL XL) 5 MG 24 hr tablet Take 5 mg by mouth daily with breakfast.  0  . losartan (COZAAR)  25 MG tablet Take 1 tablet (25 mg total) by mouth daily. 30 tablet 0  . metFORMIN (GLUCOPHAGE) 1000 MG tablet Take 1 tablet (1,000 mg total) by mouth 2 (two) times daily with a meal. 60 tablet 0  . polyethylene glycol (MIRALAX / GLYCOLAX) packet Take 17 g by mouth daily as needed for moderate constipation. 7 each 0   No current facility-administered medications on file prior to visit.     No Known Allergies  Family History  Problem Relation Age of Onset  . Diabetes Mellitus II Mother   . Esophageal cancer Father     Social History   Socioeconomic History  . Marital status: Married    Spouse name: Not on file  . Number of children: Not on file  . Years of education: Not on file  . Highest education level: Not on file  Social Needs  . Financial resource strain: Not on file  . Food  insecurity - worry: Not on file  . Food insecurity - inability: Not on file  . Transportation needs - medical: Not on file  . Transportation needs - non-medical: Not on file  Occupational History  . Not on file  Tobacco Use  . Smoking status: Never Smoker  . Smokeless tobacco: Never Used  Substance and Sexual Activity  . Alcohol use: No    Frequency: Never  . Drug use: No  . Sexual activity: No  Other Topics Concern  . Not on file  Social History Narrative  . Not on file    ROS General: Denies fever, chills, night sweats, changes in weight, changes in appetite HEENT: Denies headaches, ear pain, changes in vision, rhinorrhea, sore throat CV: Denies CP, palpitations, SOB, orthopnea Pulm: Denies SOB, cough, wheezing GI: Denies abdominal pain, nausea, vomiting, diarrhea, constipation GU: Denies dysuria, hematuria, frequency, vaginal discharge Msk: Denies muscle cramps, joint pains Neuro: Denies weakness, numbness, tingling Skin: Denies rashes, bruising Psych: Denies depression, anxiety, hallucinations  BP 122/60 (BP Location: Right Arm, Patient Position: Sitting, Cuff Size: Normal)   Pulse 67   Temp 97.8 F (36.6 C) (Oral)   Ht _0  (1.6 m)   Wt 143 lb (64.9 kg)   BMI 25.33 kg/m   Physical Exam Gen. Pleasant, well developed, well-nourished, in NAD.  At times pt is a poor historian. HEENT - [place]/AT, PERRL, no scleral icterus, no nasal drainage, pharynx without erythema or exudate. TMs normal b/l.  No cervical lymphadenopathy. Lungs: no use of accessory muscles, CTAB, no wheezes, rales or rhonchi Cardiovascular: RRR, No r/g/m, no peripheral edema Abdomen: BS present, soft, nontender,nondistended Musculoskeletal: No deformities, moves all four extremities, no cyanosis or clubbing, normal tone Neuro:  A&Ox3, CN II-XII intact, normal gait Skin:  Warm, dry, intact, no lesions Psych: normal affect, mood appropriate.  Recent Results (from the past 2160 hour(s))  POCT  urinalysis dip (device)     Status: Abnormal   Collection Time: 10/20/17 11:15 AM  Result Value Ref Range   Glucose, UA 500 (A) NEGATIVE mg/dL   Bilirubin Urine NEGATIVE NEGATIVE   Ketones, ur TRACE (A) NEGATIVE mg/dL   Specific Gravity, Urine 1.015 1.005 - 1.030   Hgb urine dipstick MODERATE (A) NEGATIVE   pH 5.0 5.0 - 8.0   Protein, ur 100 (A) NEGATIVE mg/dL   Urobilinogen, UA 0.2 0.0 - 1.0 mg/dL   Nitrite NEGATIVE NEGATIVE   Leukocytes, UA TRACE (A) NEGATIVE    Comment: Biochemical Testing Only. Please order routine urinalysis from main lab if confirmatory  testing is needed.  I-STAT, chem 8     Status: Abnormal   Collection Time: 10/20/17 11:35 AM  Result Value Ref Range   Sodium 133 (L) 135 - 145 mmol/L   Potassium 5.1 3.5 - 5.1 mmol/L   Chloride 96 (L) 101 - 111 mmol/L   BUN 61 (H) 6 - 20 mg/dL   Creatinine, Ser 1.90 (H) 0.44 - 1.00 mg/dL   Glucose, Bld 535 (HH) 65 - 99 mg/dL   Calcium, Ion 1.07 (L) 1.15 - 1.40 mmol/L   TCO2 28 22 - 32 mmol/L   Hemoglobin 15.0 12.0 - 15.0 g/dL   HCT 44.0 36.0 - 46.0 %   Comment NOTIFIED PHYSICIAN   Comprehensive metabolic panel     Status: Abnormal   Collection Time: 10/20/17  1:01 PM  Result Value Ref Range   Sodium 131 (L) 135 - 145 mmol/L   Potassium 5.1 3.5 - 5.1 mmol/L   Chloride 91 (L) 101 - 111 mmol/L   CO2 27 22 - 32 mmol/L   Glucose, Bld 544 (HH) 65 - 99 mg/dL    Comment: CRITICAL RESULT CALLED TO, READ BACK BY AND VERIFIED WITH: J.EASLEY,RN 10/20/17 1411 BY BSLADE    BUN 74 (H) 6 - 20 mg/dL   Creatinine, Ser 1.97 (H) 0.44 - 1.00 mg/dL   Calcium 8.8 (L) 8.9 - 10.3 mg/dL   Total Protein 7.1 6.5 - 8.1 g/dL   Albumin 2.6 (L) 3.5 - 5.0 g/dL   AST 16 15 - 41 U/L   ALT 19 14 - 54 U/L   Alkaline Phosphatase 149 (H) 38 - 126 U/L   Total Bilirubin 1.2 0.3 - 1.2 mg/dL   GFR calc non Af Amer 26 (L) >60 mL/min   GFR calc Af Amer 30 (L) >60 mL/min    Comment: (NOTE) The eGFR has been calculated using the CKD EPI equation. This  calculation has not been validated in all clinical situations. eGFR's persistently <60 mL/min signify possible Chronic Kidney Disease.    Anion gap 13 5 - 15  CBC with Differential     Status: Abnormal   Collection Time: 10/20/17  1:01 PM  Result Value Ref Range   WBC 11.6 (H) 4.0 - 10.5 K/uL   RBC 4.66 3.87 - 5.11 MIL/uL   Hemoglobin 13.6 12.0 - 15.0 g/dL   HCT 40.8 36.0 - 46.0 %   MCV 87.6 78.0 - 100.0 fL   MCH 29.2 26.0 - 34.0 pg   MCHC 33.3 30.0 - 36.0 g/dL   RDW 13.1 11.5 - 15.5 %   Platelets 104 (L) 150 - 400 K/uL    Comment: PLATELET COUNT CONFIRMED BY SMEAR   Neutrophils Relative % 87 %   Lymphocytes Relative 8 %   Monocytes Relative 5 %   Eosinophils Relative 0 %   Basophils Relative 0 %   Neutro Abs 10.1 (H) 1.7 - 7.7 K/uL   Lymphs Abs 0.9 0.7 - 4.0 K/uL   Monocytes Absolute 0.6 0.1 - 1.0 K/uL   Eosinophils Absolute 0.0 0.0 - 0.7 K/uL   Basophils Absolute 0.0 0.0 - 0.1 K/uL   Smear Review MORPHOLOGY UNREMARKABLE   I-Stat CG4 Lactic Acid, ED     Status: Abnormal   Collection Time: 10/20/17  1:34 PM  Result Value Ref Range   Lactic Acid, Venous 2.07 (HH) 0.5 - 1.9 mmol/L   Comment NOTIFIED PHYSICIAN   Urinalysis, Routine w reflex microscopic     Status: Abnormal   Collection  Time: 10/20/17  5:14 PM  Result Value Ref Range   Color, Urine YELLOW YELLOW   APPearance CLOUDY (A) CLEAR   Specific Gravity, Urine 1.018 1.005 - 1.030   pH 5.0 5.0 - 8.0   Glucose, UA >=500 (A) NEGATIVE mg/dL   Hgb urine dipstick MODERATE (A) NEGATIVE   Bilirubin Urine NEGATIVE NEGATIVE   Ketones, ur 5 (A) NEGATIVE mg/dL   Protein, ur 30 (A) NEGATIVE mg/dL   Nitrite NEGATIVE NEGATIVE   Leukocytes, UA MODERATE (A) NEGATIVE   RBC / HPF 0-5 0 - 5 RBC/hpf   WBC, UA TOO NUMEROUS TO COUNT 0 - 5 WBC/hpf   Bacteria, UA MANY (A) NONE SEEN   Squamous Epithelial / LPF 0-5 (A) NONE SEEN   WBC Clumps PRESENT    Hyaline Casts, UA PRESENT    Granular Casts, UA PRESENT   Urine Culture     Status:  Abnormal   Collection Time: 10/20/17  5:14 PM  Result Value Ref Range   Specimen Description URINE, RANDOM    Special Requests NONE    Culture >=100,000 COLONIES/mL KLEBSIELLA PNEUMONIAE (A)    Report Status 10/23/2017 FINAL    Organism ID, Bacteria KLEBSIELLA PNEUMONIAE (A)       Susceptibility   Klebsiella pneumoniae - MIC*    AMPICILLIN RESISTANT Resistant     CEFAZOLIN <=4 SENSITIVE Sensitive     CEFTRIAXONE <=1 SENSITIVE Sensitive     CIPROFLOXACIN <=0.25 SENSITIVE Sensitive     GENTAMICIN <=1 SENSITIVE Sensitive     IMIPENEM <=0.25 SENSITIVE Sensitive     NITROFURANTOIN 64 INTERMEDIATE Intermediate     TRIMETH/SULFA <=20 SENSITIVE Sensitive     AMPICILLIN/SULBACTAM <=2 SENSITIVE Sensitive     PIP/TAZO <=4 SENSITIVE Sensitive     Extended ESBL NEGATIVE Sensitive     * >=100,000 COLONIES/mL KLEBSIELLA PNEUMONIAE  Brain natriuretic peptide     Status: Abnormal   Collection Time: 10/20/17  5:40 PM  Result Value Ref Range   B Natriuretic Peptide 143.3 (H) 0.0 - 100.0 pg/mL  I-stat troponin, ED     Status: Abnormal   Collection Time: 10/20/17  5:53 PM  Result Value Ref Range   Troponin i, poc 0.09 (HH) 0.00 - 0.08 ng/mL   Comment NOTIFIED PHYSICIAN    Comment 3            Comment: Due to the release kinetics of cTnI, a negative result within the first hours of the onset of symptoms does not rule out myocardial infarction with certainty. If myocardial infarction is still suspected, repeat the test at appropriate intervals.   I-Stat CG4 Lactic Acid, ED     Status: Abnormal   Collection Time: 10/20/17  5:55 PM  Result Value Ref Range   Lactic Acid, Venous 2.18 (HH) 0.5 - 1.9 mmol/L   Comment NOTIFIED PHYSICIAN   Blood culture (routine x 2)     Status: Abnormal   Collection Time: 10/20/17  8:00 PM  Result Value Ref Range   Specimen Description BLOOD RIGHT HAND    Special Requests      BOTTLES DRAWN AEROBIC AND ANAEROBIC Blood Culture adequate volume   Culture  Setup Time       GRAM NEGATIVE RODS IN BOTH AEROBIC AND ANAEROBIC BOTTLES CRITICAL RESULT CALLED TO, READ BACK BY AND VERIFIED WITH: N. BATCHELDER, RPHARMD AT [place] ON 10/21/17 BY C. JESSUP, MLT.     Culture KLEBSIELLA PNEUMONIAE (A)    Report Status 10/23/2017 FINAL    Organism  ID, Bacteria KLEBSIELLA PNEUMONIAE       Susceptibility   Klebsiella pneumoniae - MIC*    AMPICILLIN RESISTANT Resistant     CEFAZOLIN <=4 SENSITIVE Sensitive     CEFEPIME <=1 SENSITIVE Sensitive     CEFTAZIDIME <=1 SENSITIVE Sensitive     CEFTRIAXONE <=1 SENSITIVE Sensitive     CIPROFLOXACIN <=0.25 SENSITIVE Sensitive     GENTAMICIN <=1 SENSITIVE Sensitive     IMIPENEM <=0.25 SENSITIVE Sensitive     TRIMETH/SULFA <=20 SENSITIVE Sensitive     AMPICILLIN/SULBACTAM <=2 SENSITIVE Sensitive     PIP/TAZO <=4 SENSITIVE Sensitive     Extended ESBL NEGATIVE Sensitive     * KLEBSIELLA PNEUMONIAE  Blood Culture ID Panel (Reflexed)     Status: Abnormal   Collection Time: 10/20/17  8:00 PM  Result Value Ref Range   Enterococcus species NOT DETECTED NOT DETECTED   Listeria monocytogenes NOT DETECTED NOT DETECTED   Staphylococcus species NOT DETECTED NOT DETECTED   Staphylococcus aureus NOT DETECTED NOT DETECTED   Streptococcus species NOT DETECTED NOT DETECTED   Streptococcus agalactiae NOT DETECTED NOT DETECTED   Streptococcus pneumoniae NOT DETECTED NOT DETECTED   Streptococcus pyogenes NOT DETECTED NOT DETECTED   Acinetobacter baumannii NOT DETECTED NOT DETECTED   Enterobacteriaceae species DETECTED (A) NOT DETECTED    Comment: Enterobacteriaceae represent a large family of gram-negative bacteria, not a single organism. CRITICAL RESULT CALLED TO, READ BACK BY AND VERIFIED WITH: N. BATCHELDER, RPHARMD AT 4585 ON 10/21/17 BY C. JESSUP, MLT    Enterobacter cloacae complex NOT DETECTED NOT DETECTED   Escherichia coli NOT DETECTED NOT DETECTED   Klebsiella oxytoca NOT DETECTED NOT DETECTED   Klebsiella pneumoniae DETECTED (A) NOT  DETECTED    Comment: CRITICAL RESULT CALLED TO, READ BACK BY AND VERIFIED WITH: N. BATCHELDER, RPHARMD AT 1240 ON 10/21/17 BY C. JESSUP, MLT    Proteus species NOT DETECTED NOT DETECTED   Serratia marcescens NOT DETECTED NOT DETECTED   Carbapenem resistance NOT DETECTED NOT DETECTED   Haemophilus influenzae NOT DETECTED NOT DETECTED   Neisseria meningitidis NOT DETECTED NOT DETECTED   Pseudomonas aeruginosa NOT DETECTED NOT DETECTED   Candida albicans NOT DETECTED NOT DETECTED   Candida glabrata NOT DETECTED NOT DETECTED   Candida krusei NOT DETECTED NOT DETECTED   Candida parapsilosis NOT DETECTED NOT DETECTED   Candida tropicalis NOT DETECTED NOT DETECTED  Blood culture (routine x 2)     Status: Abnormal   Collection Time: 10/20/17  8:03 PM  Result Value Ref Range   Specimen Description BLOOD LEFT ANTECUBITAL    Special Requests      BOTTLES DRAWN AEROBIC AND ANAEROBIC Blood Culture adequate volume   Culture  Setup Time      GRAM NEGATIVE RODS IN BOTH AEROBIC AND ANAEROBIC BOTTLES CRITICAL VALUE NOTED.  VALUE IS CONSISTENT WITH PREVIOUSLY REPORTED AND CALLED VALUE.    Culture (A)     KLEBSIELLA PNEUMONIAE SUSCEPTIBILITIES PERFORMED ON PREVIOUS CULTURE WITHIN THE LAST 5 DAYS.    Report Status 10/23/2017 FINAL   Troponin I (q 6hr x 3)     Status: Abnormal   Collection Time: 10/20/17  8:03 PM  Result Value Ref Range   Troponin I 0.11 (HH) <0.03 ng/mL    Comment: CRITICAL RESULT CALLED TO, READ BACK BY AND VERIFIED WITH: MARSHALL C,RN 10/20/17 2125 WAYK   Lipase, blood     Status: None   Collection Time: 10/20/17  8:03 PM  Result Value Ref Range  Lipase 33 11 - 51 U/L  Lactic acid, plasma     Status: Abnormal   Collection Time: 10/20/17  8:03 PM  Result Value Ref Range   Lactic Acid, Venous 2.1 (HH) 0.5 - 1.9 mmol/L    Comment: CRITICAL RESULT CALLED TO, READ BACK BY AND VERIFIED WITHBerlinda Last RN [phone] GREEN R   Procalcitonin     Status: None   Collection  Time: 10/20/17  8:03 PM  Result Value Ref Range   Procalcitonin 10.86 ng/mL    Comment:        Interpretation: PCT >= 10 ng/mL: Important systemic inflammatory response, almost exclusively due to severe bacterial sepsis or septic shock. (NOTE)       Sepsis PCT Algorithm           Lower Respiratory Tract                                      Infection PCT Algorithm    ----------------------------     ----------------------------         PCT < 0.25 ng/mL                PCT < 0.10 ng/mL         Strongly encourage             Strongly discourage   discontinuation of antibiotics    initiation of antibiotics    ----------------------------     -----------------------------       PCT 0.25 - 0.50 ng/mL            PCT 0.10 - 0.25 ng/mL               OR       >80% decrease in PCT            Discourage initiation of                                            antibiotics      Encourage discontinuation           of antibiotics    ----------------------------     -----------------------------         PCT >= 0.50 ng/mL              PCT 0.26 - 0.50 ng/mL                AND       <80% decrease in PCT             Encourage initiation of                                             antibiotics       Encourage continuation           of antibiotics    ----------------------------     -----------------------------        PCT >= 0.50 ng/mL                  PCT > 0.50 ng/mL               AND  increase in PCT                  Strongly encourage                                      initiation of antibiotics    Strongly encourage escalation           of antibiotics                                     -----------------------------                                           PCT <= 0.25 ng/mL                                                 OR                                        > 80% decrease in PCT                                     Discontinue / Do not initiate                                              antibiotics   Protime-INR     Status: None   Collection Time: 10/20/17  8:03 PM  Result Value Ref Range   Prothrombin Time 12.2 11.4 - 15.2 seconds   INR 0.92   HIV antibody (Routine Testing)     Status: None   Collection Time: 10/20/17  8:03 PM  Result Value Ref Range   HIV Screen 4th Generation wRfx Non Reactive Non Reactive    Comment: (NOTE) Performed At: [facility] [place], [place] [id number] Rush Farmer MD PY:[id number]   Rapid urine drug screen (hospital performed)     Status: None   Collection Time: 10/20/17  9:44 PM  Result Value Ref Range   Opiates NONE DETECTED NONE DETECTED   Cocaine NONE DETECTED NONE DETECTED   Benzodiazepines NONE DETECTED NONE DETECTED   Amphetamines NONE DETECTED NONE DETECTED   Tetrahydrocannabinol NONE DETECTED NONE DETECTED   Barbiturates NONE DETECTED NONE DETECTED    Comment: (NOTE) DRUG SCREEN FOR MEDICAL PURPOSES ONLY.  IF CONFIRMATION IS NEEDED FOR ANY PURPOSE, NOTIFY LAB WITHIN 5 DAYS. LOWEST DETECTABLE LIMITS FOR URINE DRUG SCREEN Drug Class                     Cutoff (ng/mL) Amphetamine and metabolites    1000 Barbiturate and metabolites    200 Benzodiazepine                 245 Tricyclics and metabolites     300 Opiates and  metabolites        300 Cocaine and metabolites        300 THC                            50   Creatinine, urine, random     Status: None   Collection Time: 10/20/17  9:48 PM  Result Value Ref Range   Creatinine, Urine 52.30 mg/dL  Sodium, urine, random     Status: None   Collection Time: 10/20/17  9:48 PM  Result Value Ref Range   Sodium, Ur 12 mmol/L  CBG monitoring, ED     Status: Abnormal   Collection Time: 10/20/17 10:51 PM  Result Value Ref Range   Glucose-Capillary 421 (H) 65 - 99 mg/dL  CBG monitoring, ED     Status: Abnormal   Collection Time: 10/20/17 11:40 PM  Result Value Ref Range   Glucose-Capillary 378 (H) 65 - 99 mg/dL  Lactic acid, plasma      Status: None   Collection Time: 10/20/17 11:41 PM  Result Value Ref Range   Lactic Acid, Venous 1.9 0.5 - 1.9 mmol/L  Troponin I (q 6hr x 3)     Status: Abnormal   Collection Time: 10/21/17  1:39 AM  Result Value Ref Range   Troponin I 0.08 (HH) <0.03 ng/mL    Comment: CRITICAL VALUE NOTED.  VALUE IS CONSISTENT WITH PREVIOUSLY REPORTED AND CALLED VALUE.  Hemoglobin A1c     Status: Abnormal   Collection Time: 10/21/17  3:25 AM  Result Value Ref Range   Hgb A1c MFr Bld 11.6 (H) 4.8 - 5.6 %    Comment: (NOTE) Pre diabetes:          5.7%-6.4% Diabetes:              >6.4% Glycemic control for   <7.0% adults with diabetes    Mean Plasma Glucose 286.22 mg/dL  Lipid panel     Status: Abnormal   Collection Time: 10/21/17  3:25 AM  Result Value Ref Range   Cholesterol 238 (H) 0 - 200 mg/dL    Comment:        ATP III CLASSIFICATION:  <200     mg/dL   Desirable  200-239  mg/dL   Borderline High  >=240    mg/dL   High           Triglycerides 630 (H) <150 mg/dL   HDL <10 (L) >40 mg/dL   Total CHOL/HDL Ratio NOT CALCULATED RATIO   VLDL UNABLE TO CALCULATE IF TRIGLYCERIDE OVER 400 mg/dL 0 - 40 mg/dL   LDL Cholesterol UNABLE TO CALCULATE IF TRIGLYCERIDE OVER 400 mg/dL 0 - 99 mg/dL    Comment:        Total Cholesterol/HDL:CHD Risk Coronary Heart Disease Risk Table                     Men   Women  1/2 Average Risk   3.4   3.3  Average Risk       5.0   4.4  2 X Average Risk   9.6   7.1  3 X Average Risk  23.4   11.0        Use the calculated Patient Ratio above and the CHD Risk Table to determine the patient's CHD Risk.        ATP III CLASSIFICATION (LDL):  <100     mg/dL  Optimal  100-129  mg/dL   Near or Above                    Optimal  130-159  mg/dL   Borderline  160-189  mg/dL   High  >190     mg/dL   Very High   Troponin I (q 6hr x 3)     Status: Abnormal   Collection Time: 10/21/17  7:39 AM  Result Value Ref Range   Troponin I 0.09 (HH) <0.03 ng/mL    Comment:  CRITICAL RESULT CALLED TO, READ BACK BY AND VERIFIED WITH: K.PATE,RN 1145 10/21/17 CLARK,S   Basic metabolic panel     Status: Abnormal   Collection Time: 10/21/17  7:39 AM  Result Value Ref Range   Sodium 136 135 - 145 mmol/L   Potassium 4.0 3.5 - 5.1 mmol/L   Chloride 100 (L) 101 - 111 mmol/L   CO2 25 22 - 32 mmol/L   Glucose, Bld 330 (H) 65 - 99 mg/dL   BUN 63 (H) 6 - 20 mg/dL   Creatinine, Ser 1.44 (H) 0.44 - 1.00 mg/dL   Calcium 8.0 (L) 8.9 - 10.3 mg/dL   GFR calc non Af Amer 38 (L) >60 mL/min   GFR calc Af Amer 44 (L) >60 mL/min    Comment: (NOTE) The eGFR has been calculated using the CKD EPI equation. This calculation has not been validated in all clinical situations. eGFR's persistently <60 mL/min signify possible Chronic Kidney Disease.    Anion gap 11 5 - 15  CBC     Status: Abnormal   Collection Time: 10/21/17  7:39 AM  Result Value Ref Range   WBC 12.2 (H) 4.0 - 10.5 K/uL   RBC 3.98 3.87 - 5.11 MIL/uL   Hemoglobin 11.6 (L) 12.0 - 15.0 g/dL   HCT 34.6 (L) 36.0 - 46.0 %   MCV 86.9 78.0 - 100.0 fL   MCH 29.1 26.0 - 34.0 pg   MCHC 33.5 30.0 - 36.0 g/dL   RDW 13.3 11.5 - 15.5 %   Platelets 86 (L) 150 - 400 K/uL    Comment: PLATELET COUNT CONFIRMED BY SMEAR  CBG monitoring, ED     Status: Abnormal   Collection Time: 10/21/17  7:54 AM  Result Value Ref Range   Glucose-Capillary 317 (H) 65 - 99 mg/dL  CBG monitoring, ED     Status: Abnormal   Collection Time: 10/21/17  2:47 PM  Result Value Ref Range   Glucose-Capillary 334 (H) 65 - 99 mg/dL  ECHOCARDIOGRAM COMPLETE     Status: Abnormal   Collection Time: 10/21/17  4:06 PM  Result Value Ref Range   Weight 2,480 oz   BP 167/87 mmHg   LV PW d 12 (A) 0.6 - 1.1 mm   FS 39 28 - 44 %   LA vol 37.5 mL   LA ID, A-P, ES 31 mm   IVS/LV PW RATIO, ED .92    LVOT VTI 21.7 cm   Reg peak vel 232 cm/s   RV sys press 25 mmHg   LV e' LATERAL 6.96 cm/s   LA diam index 1.73 cm/m2   LA vol A4C 32.6 ml   LVOT peak grad rest 5  mmHg   E decel time 214 msec   LVOT diameter 18 mm   LVOT area 2.54 cm2   LVOT peak vel 109 cm/s   LVOT SV 55.00 mL   MV pk E vel .9  m/s   TR max vel 232 cm/s   LA vol index 20.9 mL/m2   MV Dec 214    LA diam end sys 31.00 mm   TDI e' medial 5.33    TDI e' lateral 6.96    Lateral S' vel 10.10 cm/sec   TAPSE 16.10 mm  Glucose, capillary     Status: Abnormal   Collection Time: 10/21/17  5:25 PM  Result Value Ref Range   Glucose-Capillary 262 (H) 65 - 99 mg/dL   Comment 1 Notify RN    Comment 2 Document in Chart   Glucose, capillary     Status: Abnormal   Collection Time: 10/21/17  9:14 PM  Result Value Ref Range   Glucose-Capillary 261 (H) 65 - 99 mg/dL   Comment 1 Notify RN    Comment 2 Document in Chart   CBC with Differential/Platelet     Status: Abnormal   Collection Time: 10/22/17  6:42 AM  Result Value Ref Range   WBC 13.8 (H) 4.0 - 10.5 K/uL   RBC 3.59 (L) 3.87 - 5.11 MIL/uL   Hemoglobin 10.3 (L) 12.0 - 15.0 g/dL   HCT 31.1 (L) 36.0 - 46.0 %   MCV 86.6 78.0 - 100.0 fL   MCH 28.7 26.0 - 34.0 pg   MCHC 33.1 30.0 - 36.0 g/dL   RDW 13.5 11.5 - 15.5 %   Platelets 102 (L) 150 - 400 K/uL    Comment: REPEATED TO VERIFY CONSISTENT WITH PREVIOUS RESULT    Neutrophils Relative % 82 %   Lymphocytes Relative 7 %   Monocytes Relative 11 %   Eosinophils Relative 0 %   Basophils Relative 0 %   Neutro Abs 11.3 (H) 1.7 - 7.7 K/uL   Lymphs Abs 1.0 0.7 - 4.0 K/uL   Monocytes Absolute 1.5 (H) 0.1 - 1.0 K/uL   Eosinophils Absolute 0.0 0.0 - 0.7 K/uL   Basophils Absolute 0.0 0.0 - 0.1 K/uL   WBC Morphology MILD LEFT SHIFT (1-5% METAS, OCC MYELO, OCC BANDS)   Basic metabolic panel     Status: Abnormal   Collection Time: 10/22/17  6:42 AM  Result Value Ref Range   Sodium 136 135 - 145 mmol/L   Potassium 3.8 3.5 - 5.1 mmol/L   Chloride 104 101 - 111 mmol/L   CO2 24 22 - 32 mmol/L   Glucose, Bld 211 (H) 65 - 99 mg/dL   BUN 57 (H) 6 - 20 mg/dL   Creatinine, Ser 1.21 (H) 0.44 -  1.00 mg/dL   Calcium 7.7 (L) 8.9 - 10.3 mg/dL   GFR calc non Af Amer 47 (L) >60 mL/min   GFR calc Af Amer 54 (L) >60 mL/min    Comment: (NOTE) The eGFR has been calculated using the CKD EPI equation. This calculation has not been validated in all clinical situations. eGFR's persistently <60 mL/min signify possible Chronic Kidney Disease.    Anion gap 8 5 - 15  Glucose, capillary     Status: Abnormal   Collection Time: 10/22/17  7:25 AM  Result Value Ref Range   Glucose-Capillary 209 (H) 65 - 99 mg/dL   Comment 1 Notify RN   Glucose, capillary     Status: Abnormal   Collection Time: 10/22/17 11:21 AM  Result Value Ref Range   Glucose-Capillary 202 (H) 65 - 99 mg/dL   Comment 1 Notify RN   Glucose, capillary     Status: Abnormal   Collection Time: 10/22/17  4:48  PM  Result Value Ref Range   Glucose-Capillary 325 (H) 65 - 99 mg/dL   Comment 1 Notify RN   Glucose, capillary     Status: Abnormal   Collection Time: 10/22/17  8:54 PM  Result Value Ref Range   Glucose-Capillary 188 (H) 65 - 99 mg/dL   Comment 1 Notify RN    Comment 2 Document in Chart   CBC with Differential/Platelet     Status: Abnormal   Collection Time: 10/23/17  4:16 AM  Result Value Ref Range   WBC 14.3 (H) 4.0 - 10.5 K/uL   RBC 3.46 (L) 3.87 - 5.11 MIL/uL   Hemoglobin 9.9 (L) 12.0 - 15.0 g/dL   HCT 30.4 (L) 36.0 - 46.0 %   MCV 87.9 78.0 - 100.0 fL   MCH 28.6 26.0 - 34.0 pg   MCHC 32.6 30.0 - 36.0 g/dL   RDW 13.7 11.5 - 15.5 %   Platelets 143 (L) 150 - 400 K/uL   Neutrophils Relative % 82 %   Lymphocytes Relative 9 %   Monocytes Relative 8 %   Eosinophils Relative 1 %   Basophils Relative 0 %   Neutro Abs 11.8 (H) 1.7 - 7.7 K/uL   Lymphs Abs 1.3 0.7 - 4.0 K/uL   Monocytes Absolute 1.1 (H) 0.1 - 1.0 K/uL   Eosinophils Absolute 0.1 0.0 - 0.7 K/uL   Basophils Absolute 0.0 0.0 - 0.1 K/uL   WBC Morphology MILD LEFT SHIFT (1-5% METAS, OCC MYELO, OCC BANDS)     Comment: TOXIC GRANULATION  Basic metabolic  panel     Status: Abnormal   Collection Time: 10/23/17  4:16 AM  Result Value Ref Range   Sodium 137 135 - 145 mmol/L   Potassium 3.9 3.5 - 5.1 mmol/L   Chloride 108 101 - 111 mmol/L   CO2 21 (L) 22 - 32 mmol/L   Glucose, Bld 141 (H) 65 - 99 mg/dL   BUN 43 (H) 6 - 20 mg/dL   Creatinine, Ser 1.11 (H) 0.44 - 1.00 mg/dL   Calcium 7.9 (L) 8.9 - 10.3 mg/dL   GFR calc non Af Amer 52 (L) >60 mL/min   GFR calc Af Amer >60 >60 mL/min    Comment: (NOTE) The eGFR has been calculated using the CKD EPI equation. This calculation has not been validated in all clinical situations. eGFR's persistently <60 mL/min signify possible Chronic Kidney Disease.    Anion gap 8 5 - 15  Glucose, capillary     Status: Abnormal   Collection Time: 10/23/17  5:47 AM  Result Value Ref Range   Glucose-Capillary 115 (H) 65 - 99 mg/dL  Glucose, capillary     Status: Abnormal   Collection Time: 10/23/17  7:35 AM  Result Value Ref Range   Glucose-Capillary 102 (H) 65 - 99 mg/dL  Glucose, capillary     Status: Abnormal   Collection Time: 10/23/17 11:38 AM  Result Value Ref Range   Glucose-Capillary 189 (H) 65 - 99 mg/dL    Assessment/Plan: Type 2 diabetes mellitus with hyperlipidemia (HCC)  - Plan: POCT glucose (manual entry) 215 -Pt encouraged to continue checking her fsbs daily and writing the numbers down -At last [facility] pt was advised to only take Metformin.  Unclear if pt is doing this as she provides a list of meds from the hospital and states" I take these". -Will have pt continue current regimen which seems to be Metformin 1000 mg and Glipizide XL 5 mg. -pt advised  to increase po hydration and try to eat on a regular schedule. -given handouts of healthy meal options for pts with DM. -referral placed for diabetic retinopathy screen.  Essential hypertension -controlled -continue Norvasc 10 mg, losartan 25 mg, and coreg 6.25 mg BID. - Plan: CBC with Differential/Platelet, Basic metabolic panel, CBC with  Differential/Platelet  Encounter to establish care -We reviewed the PMH, PSH, FH, SH, Meds and Allergies. -We provided refills for any medications we will prescribe as needed. -We addressed current concerns per orders and patient instructions. -We have asked for records for pertinent exams, studies, vaccines and notes from previous providers. -We have advised patient to follow up per instructions below.   F/u in 1 month.  Grier Mitts, MD

## 2017-11-05 ENCOUNTER — Telehealth: Payer: Self-pay | Admitting: Family Medicine

## 2017-11-05 NOTE — Telephone Encounter (Signed)
Copied from [place]. Topic: Quick Communication - See Telephone Encounter >> Nov 05, 2017  8:14 AM Aurelio Brash B wrote: CRM for notification. See Telephone encounter for:   Pt states insurance will no longer pay for glucose blood test strips, she wants to know if she can buy the strips over the counter or if the office could order them, she does not want to use mail order service.  11/05/17.

## 2017-11-05 NOTE — Telephone Encounter (Signed)
Left a VM for patient to give the office a call back regarding testing strips. Per Dr. Volanda Napoleon patient needs to call her insurance to see what insurance they will pay for.

## 2017-11-09 ENCOUNTER — Inpatient Hospital Stay: Payer: 59 | Admitting: Family Medicine

## 2017-12-07 ENCOUNTER — Ambulatory Visit: Payer: 59 | Admitting: Family Medicine

## 2017-12-07 ENCOUNTER — Encounter: Payer: Self-pay | Admitting: Family Medicine

## 2017-12-07 VITALS — BP 190/82 | HR 82 | Temp 98.2°F | Wt 135.0 lb

## 2017-12-07 DIAGNOSIS — I10 Essential (primary) hypertension: Secondary | ICD-10-CM | POA: Diagnosis not present

## 2017-12-07 DIAGNOSIS — E1169 Type 2 diabetes mellitus with other specified complication: Secondary | ICD-10-CM | POA: Diagnosis not present

## 2017-12-07 DIAGNOSIS — E785 Hyperlipidemia, unspecified: Secondary | ICD-10-CM | POA: Diagnosis not present

## 2017-12-07 LAB — GLUCOSE, POCT (MANUAL RESULT ENTRY): POC GLUCOSE: 135 mg/dL — AB (ref 70–99)

## 2017-12-07 MED ORDER — AMLODIPINE BESYLATE 10 MG PO TABS: 10.0000 mg | ORAL_TABLET | Freq: Every day | ORAL | 3 refills | Status: DC

## 2017-12-07 MED ORDER — ATORVASTATIN CALCIUM 40 MG PO TABS: 40.0000 mg | ORAL_TABLET | Freq: Every day | ORAL | 3 refills | Status: DC

## 2017-12-07 MED ORDER — LOSARTAN POTASSIUM 25 MG PO TABS: 25.0000 mg | ORAL_TABLET | Freq: Every day | ORAL | 3 refills | Status: DC

## 2017-12-07 NOTE — Progress Notes (Signed)
Subjective:    Patient ID: Beverly Martin, female    DOB: 1956/05/14, 62 y.o.   MRN: [id number]  Chief Complaint  Patient presents with  . Follow-up    HPI Patient was seen today for follow-up on DM.  Patient was last seen in January after discharge from hospital.  Since that visit patient states she has been doing well.  Eating better though she did have some pork grinds yesterday.  Patient states her fingerstick blood sugar at home has been 120, 90, 70, 142.  Patient is taking metformin 1000 mg typically at night after she gets off work.  Patient states she was taking metformin the day but felt like it made her sleepy at work.  Patient has glipizide XL 5 mg at home but has not been taking it since last visit.  This was d/c'd as patient became weak secondary to hypoglycemia.  Patient requesting refills on blood pressure medications including Norvasc 10 mg and losartan 25 mg.  She has been out of these x 1-2 wks.  Pt states she wasn't sure she could call for refills.  Pt has still been taking Coreg 6.25 mg twice daily.  Pt denies headaches, changes in vision, chest pain, dizziness.  Past Medical History:  Diagnosis Date  . CAD (coronary artery disease)   . Diabetes mellitus without complication ([place])   . Essential hypertension   . Hyperlipidemia     No Known Allergies  ROS General: Denies fever, chills, night sweats, changes in weight, changes in appetite HEENT: Denies headaches, ear pain, changes in vision, rhinorrhea, sore throat CV: Denies CP, palpitations, SOB, orthopnea Pulm: Denies SOB, cough, wheezing GI: Denies abdominal pain, nausea, vomiting, diarrhea, constipation GU: Denies dysuria, hematuria, frequency, vaginal discharge Msk: Denies muscle cramps, joint pains Neuro: Denies weakness, numbness, tingling Skin: Denies rashes, bruising Psych: Denies depression, anxiety, hallucinations     Objective:    Blood pressure (!) 190/82, pulse 82, temperature 98.2 F (36.8 C),  temperature source Oral, weight 135 lb (61.2 kg), SpO2 93 %.  Gen. Pleasant, well-nourished, in no distress, normal affect HEENT: [place]/AT, face symmetric, no scleral icterus, PERRLA, nares patent without drainage, pharynx without erythema or exudate. Lungs: no accessory muscle use, CTAB, no wheezes or rales Cardiovascular: RRR, no m/r/g, no peripheral edema Abdomen: BS present, soft, NT/ND Neuro:  A&Ox3, CN II-XII intact, normal gait   Wt Readings from Last 3 Encounters:  12/07/17 135 lb (61.2 kg)  11/04/17 143 lb (64.9 kg)  10/26/17 150 lb 6 oz (68.2 kg)    Lab Results  Component Value Date   WBC 9.4 11/04/2017   HGB 9.8 (L) 11/04/2017   HCT 28.7 (L) 11/04/2017   PLT 399.0 11/04/2017   GLUCOSE 189 (H) 11/04/2017   CHOL 238 (H) 10/21/2017   TRIG 630 (H) 10/21/2017   HDL <10 (L) 10/21/2017   LDLCALC UNABLE TO CALCULATE IF TRIGLYCERIDE OVER 400 mg/dL 10/21/2017   ALT 19 10/20/2017   AST 16 10/20/2017   NA 141 11/04/2017   K 5.0 11/04/2017   CL 103 11/04/2017   CREATININE 1.09 11/04/2017   BUN 28 (H) 11/04/2017   CO2 25 11/04/2017   INR 0.92 10/20/2017   HGBA1C 11.6 (H) 10/21/2017    Assessment/Plan:  Essential hypertension  -recheck  bp. -uncontrolled likely 2/2 patient being out of 2 meds x 2 weeks -Patient encouraged to control limiting sodium intake p.o. intake of water. -Patient also encouraged to increase physical activity. - Plan: losartan (COZAAR) 25 MG  tablet, amLODipine (NORVASC) 10 MG tablet  Type 2 diabetes mellitus with hyperlipidemia (HCC)  -BS 135 in clinic -Hemoglobin A1c 11.6% on 10/21/17. -Patient encouraged to continue with lifestyle modifications -Continue checking FSBS at home daily, more frequently if not feeling well. -Patient given handout -Continue metformin 1000 mg - Plan: POCT glucose (manual entry), atorvastatin (LIPITOR) 40 MG tablet  Follow-up in the next 1-2 months.  Grier Mitts, MD

## 2017-12-07 NOTE — Patient Instructions (Addendum)
Do not forget to pick up aspirin 81 mg from your local pharmacy or drugstore to take 1 every day.  [facility] [address]. DASH Eating Plan DASH stands for "Dietary Approaches to Stop Hypertension." The DASH eating plan is a healthy eating plan that has been shown to reduce high blood pressure (hypertension). It may also reduce your risk for type 2 diabetes, heart disease, and stroke. The DASH eating plan may also help with weight loss. What are tips for following this plan? General guidelines  Avoid eating more than 2,300 mg (milligrams) of salt (sodium) a day. If you have hypertension, you may need to reduce your sodium intake to 1,500 mg a day.  Limit alcohol intake to no more than 1 drink a day for nonpregnant women and 2 drinks a day for men. One drink equals 12 oz of beer, 5 oz of wine, or 1 oz of hard liquor.  Work with your health care provider to maintain a healthy body weight or to lose weight. Ask what an ideal weight is for you.  Get at least 30 minutes of exercise that causes your heart to beat faster (aerobic exercise) most days of the week. Activities may include walking, swimming, or biking.  Work with your health care provider or diet and nutrition specialist (dietitian) to adjust your eating plan to your individual calorie needs. Reading food labels  Check food labels for the amount of sodium per serving. Choose foods with less than 5 percent of the Daily Value of sodium. Generally, foods with less than 300 mg of sodium per serving fit into this eating plan.  To find whole grains, look for the word "whole" as the first word in the ingredient list. Shopping  Buy products labeled as "low-sodium" or "no salt added."  Buy fresh foods. Avoid canned foods and premade or frozen meals. Cooking  Avoid adding salt when cooking. Use salt-free seasonings or herbs instead of table salt or sea salt. Check with your health care provider or pharmacist  before using salt substitutes.  Do not fry foods. Cook foods using healthy methods such as baking, boiling, grilling, and broiling instead.  Cook with heart-healthy oils, such as olive, canola, soybean, or sunflower oil. Meal planning   Eat a balanced diet that includes: ? 5 or more servings of fruits and vegetables each day. At each meal, try to fill half of your plate with fruits and vegetables. ? Up to 6-8 servings of whole grains each day. ? Less than 6 oz of lean meat, poultry, or fish each day. A 3-oz serving of meat is about the same size as a deck of cards. One egg equals 1 oz. ? 2 servings of low-fat dairy each day. ? A serving of nuts, seeds, or beans 5 times each week. ? Heart-healthy fats. Healthy fats called Omega-3 fatty acids are found in foods such as flaxseeds and coldwater fish, like sardines, salmon, and mackerel.  Limit how much you eat of the following: ? Canned or prepackaged foods. ? Food that is high in trans fat, such as fried foods. ? Food that is high in saturated fat, such as fatty meat. ? Sweets, desserts, sugary drinks, and other foods with added sugar. ? Full-fat dairy products.  Do not salt foods before eating.  Try to eat at least 2 vegetarian meals each week.  Eat more home-cooked food and less restaurant, buffet, and fast food.  When eating at a restaurant,  ask that your food be prepared with less salt or no salt, if possible. What foods are recommended? The items listed may not be a complete list. Talk with your dietitian about what dietary choices are best for you. Grains Whole-grain or whole-wheat bread. Whole-grain or whole-wheat pasta. Brown rice. [place]. Bulgur. Whole-grain and low-sodium cereals. Pita bread. Low-fat, low-sodium crackers. Whole-wheat flour tortillas. Vegetables Fresh or frozen vegetables (raw, steamed, roasted, or grilled). Low-sodium or reduced-sodium tomato and vegetable juice. Low-sodium or reduced-sodium tomato  sauce and tomato paste. Low-sodium or reduced-sodium canned vegetables. Fruits All fresh, dried, or frozen fruit. Canned fruit in natural juice (without added sugar). Meat and other protein foods Skinless chicken or [place]. Ground chicken or [place]. Pork with fat trimmed off. Fish and seafood. Egg whites. Dried beans, peas, or lentils. Unsalted nuts, nut butters, and seeds. Unsalted canned beans. Lean cuts of beef with fat trimmed off. Low-sodium, lean deli meat. Dairy Low-fat (1%) or fat-free (skim) milk. Fat-free, low-fat, or reduced-fat cheeses. Nonfat, low-sodium ricotta or cottage cheese. Low-fat or nonfat yogurt. Low-fat, low-sodium cheese. Fats and oils Soft margarine without trans fats. Vegetable oil. Low-fat, reduced-fat, or light mayonnaise and salad dressings (reduced-sodium). Canola, safflower, olive, soybean, and sunflower oils. Avocado. Seasoning and other foods Herbs. Spices. Seasoning mixes without salt. Unsalted popcorn and pretzels. Fat-free sweets. What foods are not recommended? The items listed may not be a complete list. Talk with your dietitian about what dietary choices are best for you. Grains Baked goods made with fat, such as croissants, muffins, or some breads. Dry pasta or rice meal packs. Vegetables Creamed or fried vegetables. Vegetables in a cheese sauce. Regular canned vegetables (not low-sodium or reduced-sodium). Regular canned tomato sauce and paste (not low-sodium or reduced-sodium). Regular tomato and vegetable juice (not low-sodium or reduced-sodium). Angie Fava. Olives. Fruits Canned fruit in a light or heavy syrup. Fried fruit. Fruit in cream or butter sauce. Meat and other protein foods Fatty cuts of meat. Ribs. Fried meat. Berniece Salines. Sausage. Bologna and other processed lunch meats. Salami. Fatback. Hotdogs. Bratwurst. Salted nuts and seeds. Canned beans with added salt. Canned or smoked fish. Whole eggs or egg yolks. Chicken or [place] with skin. Dairy Whole  or 2% milk, cream, and half-and-half. Whole or full-fat cream cheese. Whole-fat or sweetened yogurt. Full-fat cheese. Nondairy creamers. Whipped toppings. Processed cheese and cheese spreads. Fats and oils Butter. Stick margarine. Lard. Shortening. Ghee. Bacon fat. Tropical oils, such as coconut, palm kernel, or palm oil. Seasoning and other foods Salted popcorn and pretzels. Onion salt, garlic salt, seasoned salt, table salt, and sea salt. Worcestershire sauce. Tartar sauce. Barbecue sauce. Teriyaki sauce. Soy sauce, including reduced-sodium. Steak sauce. Canned and packaged gravies. Fish sauce. Oyster sauce. Cocktail sauce. Horseradish that you find on the shelf. Ketchup. Mustard. Meat flavorings and tenderizers. Bouillon cubes. Hot sauce and Tabasco sauce. Premade or packaged marinades. Premade or packaged taco seasonings. Relishes. Regular salad dressings. Where to find more information:  National Heart, Lung, and [place]: https://wilson-eaton.com/  American Heart Association: www.heart.org Summary  The DASH eating plan is a healthy eating plan that has been shown to reduce high blood pressure (hypertension). It may also reduce your risk for type 2 diabetes, heart disease, and stroke.  With the DASH eating plan, you should limit salt (sodium) intake to 2,300 mg a day. If you have hypertension, you may need to reduce your sodium intake to 1,500 mg a day.  When on the DASH eating plan, aim to eat more fresh  fruits and vegetables, whole grains, lean proteins, low-fat dairy, and heart-healthy fats.  Work with your health care provider or diet and nutrition specialist (dietitian) to adjust your eating plan to your individual calorie needs. This information is not intended to replace advice given to you by your health care provider. Make sure you discuss any questions you have with your health care provider. Document Released: 09/18/2011 Document Revised: 09/22/2016 Document Reviewed:  09/22/2016 Elsevier Interactive Patient Education  [employer].

## 2017-12-21 ENCOUNTER — Telehealth: Payer: Self-pay | Admitting: Family Medicine

## 2017-12-21 ENCOUNTER — Other Ambulatory Visit: Payer: Self-pay | Admitting: Family Medicine

## 2017-12-21 MED ORDER — METFORMIN HCL 1000 MG PO TABS: 1000.0000 mg | ORAL_TABLET | Freq: Two times a day (BID) | ORAL | 3 refills | Status: DC

## 2017-12-21 NOTE — Telephone Encounter (Signed)
Copied from [place]. Topic: Quick Communication - Rx Refill/Question >> Dec 21, 2017  9:39 AM Waylan Rocher, Lumin L wrote: Medication: metFORMIN (GLUCOPHAGE) 1000 MG tablet (1 pill left)  Has the patient contacted their pharmacy? Yes.    (Agent: If no, request that the patient contact the pharmacy for the refill.)  Preferred Pharmacy (with phone number or street name): [id number] [place], [place] [address] [place] [address] Phone: [phone] Fax: [phone]  Agent: Please be advised that RX refills may take up to 3 business days. We ask that you follow-up with your pharmacy.

## 2017-12-21 NOTE — Progress Notes (Signed)
Metformin refilled.  Please advise patient to continue holding glipizide 5 mg.  Will need to know what patient's blood sugars have been running to know if it is appropriate to add the medication back.

## 2017-12-21 NOTE — Telephone Encounter (Signed)
Copied from [place] [id number] [id number]. Topic: Quick Communication - Rx Refill/Question >> Dec 21, 2017  9:41 AM Waylan Rocher, Lumin L wrote: Medication: Glipizide 5mg  (1 tab by mouth daily w/ breakfast) Has the patient contacted their pharmacy? Yes.  (question about when to start taking) (Agent: If no, request that the patient contact the pharmacy for the refill.) Preferred Pharmacy (with phone number or street name): [id number] [place], [place] [address]  [place] [address] Phone: [phone] Fax: [phone] Agent: Please be advised that RX refills may take up to 3 business days. We ask that you follow-up with your pharmacy.  Patient needs to know if it's okay to start taking the Glipizide 5mg ? She was advised not to take it because her sugars started to get low but she only has 1 metformin pill left which she has just requested a refill on. Please leave voicemail if no answer at home phone to let her know if yes or no.

## 2017-12-21 NOTE — Telephone Encounter (Signed)
Pt has a question regarding the Glipizide 5mg . She states she is almost out of the Meformin and wants to know if she can take the Giipizide if she runs out of the [place]. She said that she was told not to take the Gipizide.  Requesting refill on Metformin. Prescription expired on 11/22/17. Last refilled was 10/23/17. Please review.  Provider:  Grier Mitts, MD  LOV  12/07/17 NOV  03/09/18  [facility]  [address]

## 2017-12-21 NOTE — Telephone Encounter (Signed)
Please see below message, please advise.

## 2017-12-21 NOTE — Telephone Encounter (Signed)
Metformin refilled.  Have patient continue to hold glipizide 5 mg.  Will need to know what patient's blood sugars are running in order to decide if it needs to be added back.

## 2017-12-23 NOTE — Telephone Encounter (Signed)
Left message on voicemail to call office.  

## 2018-01-21 ENCOUNTER — Other Ambulatory Visit: Payer: Self-pay | Admitting: Family Medicine

## 2018-03-03 ENCOUNTER — Other Ambulatory Visit: Payer: Self-pay | Admitting: Family Medicine

## 2018-03-09 ENCOUNTER — Encounter: Payer: Self-pay | Admitting: Family Medicine

## 2018-03-09 ENCOUNTER — Ambulatory Visit: Payer: 59 | Admitting: Family Medicine

## 2018-03-09 VITALS — BP 180/78 | HR 71 | Temp 98.3°F | Ht 63.0 in | Wt 132.6 lb

## 2018-03-09 DIAGNOSIS — E785 Hyperlipidemia, unspecified: Secondary | ICD-10-CM

## 2018-03-09 DIAGNOSIS — E1169 Type 2 diabetes mellitus with other specified complication: Secondary | ICD-10-CM | POA: Diagnosis not present

## 2018-03-09 DIAGNOSIS — I10 Essential (primary) hypertension: Secondary | ICD-10-CM | POA: Diagnosis not present

## 2018-03-09 LAB — POCT GLYCOSYLATED HEMOGLOBIN (HGB A1C): HBA1C, POC (CONTROLLED DIABETIC RANGE): 7.8 % — AB (ref 0.0–7.0)

## 2018-03-09 LAB — POCT CBG (FASTING - GLUCOSE)-MANUAL ENTRY: Glucose Fasting, POC: 149 mg/dL — AB (ref 70–99)

## 2018-03-09 MED ORDER — LOSARTAN POTASSIUM 50 MG PO TABS: 50.0000 mg | ORAL_TABLET | Freq: Every day | ORAL | 3 refills | Status: DC

## 2018-03-09 NOTE — Progress Notes (Signed)
Subjective:    Patient ID: Beverly Martin, female    DOB: 1956-06-28, 62 y.o.   MRN: [id number]  Chief Complaint  Patient presents with  . Follow-up    Glucose follow up    HPI Patient was seen today for follow-up on diabetes and hypertension.  Patient endorses feeling well overall.  She has been compliant with her medications.  She is currently taking metformin 1000 mg nightly.  She denies hypoglycemia.  States her blood sugars have been 1 70-1 90 2 in the morning.  Patient is trying to eat better, but states she has days where she eats whenever people bring her.  Patient is not checking blood pressure at home.  States she is not been having headaches, changes in vision, nausea vomiting.  Patient is currently taking Coreg 6.25 mg twice daily, losartan 25 mg daily, and Norvasc 10 mg daily.  Past Medical History:  Diagnosis Date  . CAD (coronary artery disease)   . Diabetes mellitus without complication ([place])   . Essential hypertension   . Hyperlipidemia     No Known Allergies  ROS General: Denies fever, chills, night sweats, changes in weight, changes in appetite HEENT: Denies headaches, ear pain, changes in vision, rhinorrhea, sore throat CV: Denies CP, palpitations, SOB, orthopnea Pulm: Denies SOB, cough, wheezing GI: Denies abdominal pain, nausea, vomiting, diarrhea, constipation GU: Denies dysuria, hematuria, frequency, vaginal discharge Msk: Denies muscle cramps, joint pains Neuro: Denies weakness, numbness, tingling Skin: Denies rashes, bruising Psych: Denies depression, anxiety, hallucinations     Objective:    Blood pressure (!) 180/78, pulse 71, temperature 98.3 F (36.8 C), temperature source Oral, height 5\' 3"  (1.6 m), weight 132 lb 9.6 oz (60.1 kg), SpO2 96 %.   Gen. Pleasant, well-nourished, in no distress, normal affect   HEENT: [place]/AT, face symmetric, no scleral icterus, PERRLA, nares patent without drainage,  Lungs: no accessory muscle use, CTAB, no wheezes  or rales Cardiovascular: RRR, no m/r/g, no peripheral edema Abdomen: BS present, soft, NT/ND Neuro:  A&Ox3, CN II-XII intact, normal gait Foot Exam done this visit:  Vibratory testing diminished.  Monofilament testing normal.   Wt Readings from Last 3 Encounters:  03/09/18 132 lb 9.6 oz (60.1 kg)  12/07/17 135 lb (61.2 kg)  11/04/17 143 lb (64.9 kg)    Lab Results  Component Value Date   WBC 9.4 11/04/2017   HGB 9.8 (L) 11/04/2017   HCT 28.7 (L) 11/04/2017   PLT 399.0 11/04/2017   GLUCOSE 189 (H) 11/04/2017   CHOL 238 (H) 10/21/2017   TRIG 630 (H) 10/21/2017   HDL <10 (L) 10/21/2017   LDLCALC UNABLE TO CALCULATE IF TRIGLYCERIDE OVER 400 mg/dL 10/21/2017   ALT 19 10/20/2017   AST 16 10/20/2017   NA 141 11/04/2017   K 5.0 11/04/2017   CL 103 11/04/2017   CREATININE 1.09 11/04/2017   BUN 28 (H) 11/04/2017   CO2 25 11/04/2017   INR 0.92 10/20/2017   HGBA1C 11.6 (H) 10/21/2017    Assessment/Plan:  Type 2 diabetes mellitus with hyperlipidemia ([place])  - lifestyle modifications encouraged including decreasing the number of sweets and carbohydrates ingested. -Foot exam done this visit. -Hemoglobin A1c checked this visit 7.6%.  Was 11.6% on 10/21/17. -Patient to continue taking metformin 1000 mg nightly.  Continue holding glipizide XL as patient had hypoglycemic episodes. -Discussed blood sugar becomes uncontrolled in the future to take metformin twice daily. - Plan: POCT CBG (Fasting - Glucose), POCT glycosylated hemoglobin (Hb A1C)  Essential  hypertension  -Uncontrolled -Patient encouraged to decrease sodium intake. -Patient encouraged to check BP at home. -We will increase losartan to 50 mg daily.  Patient to continue taking Coreg 6.25 mg twice daily as well as Norvasc 10 mg daily. - Plan: losartan (COZAAR) 50 MG tablet  Follow-up in the next month, sooner if needed to recheck BP.  Grier Mitts, MD

## 2018-03-09 NOTE — Patient Instructions (Addendum)
You should continue taking Norvasc 10 mg daily and carvedilol 6.25 mg twice a day.  I have increased your losartan from 25 mg to 50 mg.  I have sent this new prescription to the pharmacy.    Continue taking Metformin 1000 mg in the evenin.  You should try to take Metformin 1000 mg in the morning as well.  Remember to take this medicine with food. DASH Eating Plan DASH stands for "Dietary Approaches to Stop Hypertension." The DASH eating plan is a healthy eating plan that has been shown to reduce high blood pressure (hypertension). It may also reduce your risk for type 2 diabetes, heart disease, and stroke. The DASH eating plan may also help with weight loss. What are tips for following this plan? General guidelines  Avoid eating more than 2,300 mg (milligrams) of salt (sodium) a day. If you have hypertension, you may need to reduce your sodium intake to 1,500 mg a day.  Limit alcohol intake to no more than 1 drink a day for nonpregnant women and 2 drinks a day for men. One drink equals 12 oz of beer, 5 oz of wine, or 1 oz of hard liquor.  Work with your health care provider to maintain a healthy body weight or to lose weight. Ask what an ideal weight is for you.  Get at least 30 minutes of exercise that causes your heart to beat faster (aerobic exercise) most days of the week. Activities may include walking, swimming, or biking.  Work with your health care provider or diet and nutrition specialist (dietitian) to adjust your eating plan to your individual calorie needs. Reading food labels  Check food labels for the amount of sodium per serving. Choose foods with less than 5 percent of the Daily Value of sodium. Generally, foods with less than 300 mg of sodium per serving fit into this eating plan.  To find whole grains, look for the word "whole" as the first word in the ingredient list. Shopping  Buy products labeled as "low-sodium" or "no salt added."  Buy fresh foods. Avoid canned foods  and premade or frozen meals. Cooking  Avoid adding salt when cooking. Use salt-free seasonings or herbs instead of table salt or sea salt. Check with your health care provider or pharmacist before using salt substitutes.  Do not fry foods. Cook foods using healthy methods such as baking, boiling, grilling, and broiling instead.  Cook with heart-healthy oils, such as olive, canola, soybean, or sunflower oil. Meal planning   Eat a balanced diet that includes: ? 5 or more servings of fruits and vegetables each day. At each meal, try to fill half of your plate with fruits and vegetables. ? Up to 6-8 servings of whole grains each day. ? Less than 6 oz of lean meat, poultry, or fish each day. A 3-oz serving of meat is about the same size as a deck of cards. One egg equals 1 oz. ? 2 servings of low-fat dairy each day. ? A serving of nuts, seeds, or beans 5 times each week. ? Heart-healthy fats. Healthy fats called Omega-3 fatty acids are found in foods such as flaxseeds and coldwater fish, like sardines, salmon, and mackerel.  Limit how much you eat of the following: ? Canned or prepackaged foods. ? Food that is high in trans fat, such as fried foods. ? Food that is high in saturated fat, such as fatty meat. ? Sweets, desserts, sugary drinks, and other foods with added sugar. ? Full-fat dairy  products.  Do not salt foods before eating.  Try to eat at least 2 vegetarian meals each week.  Eat more home-cooked food and less restaurant, buffet, and fast food.  When eating at a restaurant, ask that your food be prepared with less salt or no salt, if possible. What foods are recommended? The items listed may not be a complete list. Talk with your dietitian about what dietary choices are best for you. Grains Whole-grain or whole-wheat bread. Whole-grain or whole-wheat pasta. Brown rice. [place]. Bulgur. Whole-grain and low-sodium cereals. Pita bread. Low-fat, low-sodium crackers.  Whole-wheat flour tortillas. Vegetables Fresh or frozen vegetables (raw, steamed, roasted, or grilled). Low-sodium or reduced-sodium tomato and vegetable juice. Low-sodium or reduced-sodium tomato sauce and tomato paste. Low-sodium or reduced-sodium canned vegetables. Fruits All fresh, dried, or frozen fruit. Canned fruit in natural juice (without added sugar). Meat and other protein foods Skinless chicken or [place]. Ground chicken or [place]. Pork with fat trimmed off. Fish and seafood. Egg whites. Dried beans, peas, or lentils. Unsalted nuts, nut butters, and seeds. Unsalted canned beans. Lean cuts of beef with fat trimmed off. Low-sodium, lean deli meat. Dairy Low-fat (1%) or fat-free (skim) milk. Fat-free, low-fat, or reduced-fat cheeses. Nonfat, low-sodium ricotta or cottage cheese. Low-fat or nonfat yogurt. Low-fat, low-sodium cheese. Fats and oils Soft margarine without trans fats. Vegetable oil. Low-fat, reduced-fat, or light mayonnaise and salad dressings (reduced-sodium). Canola, safflower, olive, soybean, and sunflower oils. Avocado. Seasoning and other foods Herbs. Spices. Seasoning mixes without salt. Unsalted popcorn and pretzels. Fat-free sweets. What foods are not recommended? The items listed may not be a complete list. Talk with your dietitian about what dietary choices are best for you. Grains Baked goods made with fat, such as croissants, muffins, or some breads. Dry pasta or rice meal packs. Vegetables Creamed or fried vegetables. Vegetables in a cheese sauce. Regular canned vegetables (not low-sodium or reduced-sodium). Regular canned tomato sauce and paste (not low-sodium or reduced-sodium). Regular tomato and vegetable juice (not low-sodium or reduced-sodium). Angie Fava. Olives. Fruits Canned fruit in a light or heavy syrup. Fried fruit. Fruit in cream or butter sauce. Meat and other protein foods Fatty cuts of meat. Ribs. Fried meat. Berniece Salines. Sausage. Bologna and other  processed lunch meats. Salami. Fatback. Hotdogs. Bratwurst. Salted nuts and seeds. Canned beans with added salt. Canned or smoked fish. Whole eggs or egg yolks. Chicken or [place] with skin. Dairy Whole or 2% milk, cream, and half-and-half. Whole or full-fat cream cheese. Whole-fat or sweetened yogurt. Full-fat cheese. Nondairy creamers. Whipped toppings. Processed cheese and cheese spreads. Fats and oils Butter. Stick margarine. Lard. Shortening. Ghee. Bacon fat. Tropical oils, such as coconut, palm kernel, or palm oil. Seasoning and other foods Salted popcorn and pretzels. Onion salt, garlic salt, seasoned salt, table salt, and sea salt. Worcestershire sauce. Tartar sauce. Barbecue sauce. Teriyaki sauce. Soy sauce, including reduced-sodium. Steak sauce. Canned and packaged gravies. Fish sauce. Oyster sauce. Cocktail sauce. Horseradish that you find on the shelf. Ketchup. Mustard. Meat flavorings and tenderizers. Bouillon cubes. Hot sauce and Tabasco sauce. Premade or packaged marinades. Premade or packaged taco seasonings. Relishes. Regular salad dressings. Where to find more information:  National Heart, Lung, and [place]: https://wilson-eaton.com/  American Heart Association: www.heart.org Summary  The DASH eating plan is a healthy eating plan that has been shown to reduce high blood pressure (hypertension). It may also reduce your risk for type 2 diabetes, heart disease, and stroke.  With the DASH eating plan, you should limit  salt (sodium) intake to 2,300 mg a day. If you have hypertension, you may need to reduce your sodium intake to 1,500 mg a day.  When on the DASH eating plan, aim to eat more fresh fruits and vegetables, whole grains, lean proteins, low-fat dairy, and heart-healthy fats.  Work with your health care provider or diet and nutrition specialist (dietitian) to adjust your eating plan to your individual calorie needs. This information is not intended to replace advice given to  you by your health care provider. Make sure you discuss any questions you have with your health care provider. Document Released: 09/18/2011 Document Revised: 09/22/2016 Document Reviewed: 09/22/2016 Elsevier Interactive Patient Education  2018 [employer].  How to Avoid Diabetes Mellitus Problems You can take action to prevent or slow down problems that are caused by diabetes (diabetes mellitus). Following your diabetes plan and taking care of yourself can reduce your risk of serious or life-threatening complications. Manage your diabetes  Follow instructions from your health care providers about managing your diabetes. Your diabetes may be managed by a team of health care providers who can teach you how to care for yourself and can answer questions that you have.  Educate yourself about your condition so you can make healthy choices about eating and physical activity.  Check your blood sugar (glucose) levels as often as directed. Your health care provider will help you decide how often to check your blood glucose level depending on your treatment goals and how well you are meeting them.  Ask your health care provider if you should take low-dose aspirin daily and what dose is recommended for you. Taking low-dose aspirin daily is recommended to help prevent cardiovascular disease. Do not use nicotine or tobacco Do not use any products that contain nicotine or tobacco, such as cigarettes and e-cigarettes. If you need help quitting, ask your health care provider. Nicotine raises your risk for diabetes problems. If you quit using nicotine:  You will lower your risk for heart attack, stroke, nerve disease, and kidney disease.  Your cholesterol and blood pressure may improve.  Your blood circulation will improve.  Keep your blood pressure under control To control your blood pressure:  Follow instructions from your health care provider about meal planning, exercise, and medicines.  Make sure  your health care provider checks your blood pressure at every medical visit.  A blood pressure reading consists of two numbers. Generally, the goal is to keep your top number (systolic pressure) at or below 130, and your bottom number (diastolic pressure) at or below 80. Your health care provider may recommend a lower target blood pressure. Your individualized target blood pressure is determined based on:  Your age.  Your medicines.  How long you have had diabetes.  Any other medical conditions you have.  Keep your cholesterol under control To control your cholesterol:  Follow instructions from your health care provider about meal planning, exercise, and medicines.  Have your cholesterol checked at least once a year.  You may be prescribed medicine to lower cholesterol (statin). If you are not taking a statin, ask your health care provider if you should be.  Controlling your cholesterol may:  Help prevent heart disease and stroke. These are the most common health problems for people with diabetes.  Improve your blood flow.  Schedule and keep yearly physical exams and eye exams Your health care provider will tell you how often you need medical visits depending on your diabetes management plan. Keep all follow-up visits  as directed. This is important so possible problems can be identified early and complications can be avoided or treated.  Every visit with your health care provider should include measuring your: ? Weight. ? Blood pressure. ? Blood glucose control.  Your A1c (hemoglobin A1c) level should be checked: ? At least 2 times a year, if you are meeting your treatment goals. ? 4 times a year, if you are not meeting treatment goals or if your treatment goals have changed.  Your blood lipids (lipid profile) should be checked yearly. You should also be checked yearly for protein in your urine (urine microalbumin).  If you have type 1 diabetes, get an eye exam 3-5 years  after you are diagnosed, and then once a year after your first exam.  If you have type 2 diabetes, get an eye exam as soon as you are diagnosed, and then once a year after your first exam.  Keep your vaccines current It is recommended that you receive:  A flu (influenza) vaccine every year.  A pneumonia (pneumococcal) vaccine and a hepatitis B vaccine. If you are age 34 or older, you may get the pneumonia vaccine as a series of two separate shots.  Ask your health care provider which other vaccines may be recommended. Take care of your feet Diabetes may cause you to have poor blood circulation to your legs and feet. Because of this, taking care of your feet is very important. Diabetes can cause:  The skin on the feet to get thinner, break more easily, and heal more slowly.  Nerve damage in your legs and feet, which results in decreased feeling. You may not notice minor injuries that could lead to serious problems.  To avoid foot problems:  Check your skin and feet every day for cuts, bruises, redness, blisters, or sores.  Schedule a foot exam with your health care provider once every year. This exam includes: ? Inspecting of the structure and skin of your feet. ? Checking the pulses and sensation in your feet.  Make sure that your health care provider performs a visual foot exam at every medical visit.  Take care of your teeth People with poorly controlled diabetes are more likely to have gum (periodontal) disease. Diabetes can make periodontal diseases harder to control. If not treated, periodontal diseases can lead to tooth loss. To prevent this:  Brush your teeth twice a day.  Floss at least once a day.  Visit your dentist 2 times a year.  Drink responsibly Limit alcohol intake to no more than 1 drink a day for nonpregnant women and 2 drinks a day for men. One drink equals 12 oz of beer, 5 oz of wine, or 1 oz of hard liquor. It is important to eat food when you drink  alcohol to avoid low blood glucose (hypoglycemia). Avoid alcohol if you:  Have a history of alcohol abuse or dependence.  Are pregnant.  Have liver disease, pancreatitis, advanced neuropathy, or severe hypertriglyceridemia.  Lessen stress Living with diabetes can be stressful. When you are experiencing stress, your blood glucose may be affected in two ways:  Stress hormones may cause your blood glucose to rise.  You may be distracted from taking good care of yourself.  Be aware of your stress level and make changes to help you manage challenging situations. To lower your stress levels:  Consider joining a support group.  Do planned relaxation or meditation.  Do a hobby that you enjoy.  Maintain healthy relationships.  Exercise  regularly.  Work with your health care provider or a mental health professional.  Summary  You can take action to prevent or slow down problems that are caused by diabetes (diabetes mellitus). Following your diabetes plan and taking care of yourself can reduce your risk of serious or life-threatening complications.  Follow instructions from your health care providers about managing your diabetes. Your diabetes may be managed by a team of health care providers who can teach you how to care for yourself and can answer questions that you have.  Your health care provider will tell you how often you need medical visits depending on your diabetes management plan. Keep all follow-up visits as directed. This is important so possible problems can be identified early and complications can be avoided or treated. This information is not intended to replace advice given to you by your health care provider. Make sure you discuss any questions you have with your health care provider. Document Released: 06/17/2011 Document Revised: 06/28/2016 Document Reviewed: 06/28/2016 Elsevier Interactive Patient Education  [employer].

## 2018-06-09 ENCOUNTER — Encounter: Payer: Self-pay | Admitting: Family Medicine

## 2018-06-09 ENCOUNTER — Ambulatory Visit: Payer: 59 | Admitting: Family Medicine

## 2018-06-09 VITALS — BP 160/76 | HR 74 | Temp 98.3°F | Wt 132.0 lb

## 2018-06-09 DIAGNOSIS — E1169 Type 2 diabetes mellitus with other specified complication: Secondary | ICD-10-CM

## 2018-06-09 DIAGNOSIS — I2510 Atherosclerotic heart disease of native coronary artery without angina pectoris: Secondary | ICD-10-CM | POA: Diagnosis not present

## 2018-06-09 DIAGNOSIS — I10 Essential (primary) hypertension: Secondary | ICD-10-CM

## 2018-06-09 DIAGNOSIS — E785 Hyperlipidemia, unspecified: Secondary | ICD-10-CM

## 2018-06-09 LAB — POCT GLYCOSYLATED HEMOGLOBIN (HGB A1C): HEMOGLOBIN A1C: 7 % — AB (ref 4.0–5.6)

## 2018-06-11 ENCOUNTER — Encounter: Payer: Self-pay | Admitting: Family Medicine

## 2018-06-11 NOTE — Progress Notes (Signed)
Subjective:    Patient ID: Beverly Martin, female    DOB: 01/17/1956, 62 y.o.   MRN: [id number]  No chief complaint on file.   HPI Patient was seen today for f/u.  DM II: -pt taking metformin 1000 mg daily.  Pt should be taking BID. -glipizide XL 5 mg still on med list howerver, pt is not taking this currently.   -in the past glipizide d/c as pt had a syncopal episode thought 2/2 glipizide/UTI. -pt trying to eat better. -not checking fsbs regularly  HTN: -Taking Coreg 6.25 mg twice daily, Norvasc 10 mg daily, and losartan 50 mg daily. -Pt is not checking BP at home. -pt denies headaches, blurred vision, chest pain  CAD/HLD: -Pt taking Lipitor 40 mg daily -Pt states she has been out of this medication and requesting refill. -denies myalgias  Past Medical History:  Diagnosis Date  . CAD (coronary artery disease)   . Diabetes mellitus without complication ([place])   . Essential hypertension   . Hyperlipidemia     No Known Allergies  ROS General: Denies fever, chills, night sweats, changes in weight, changes in appetite HEENT: Denies headaches, ear pain, changes in vision, rhinorrhea, sore throat CV: Denies CP, palpitations, SOB, orthopnea Pulm: Denies SOB, cough, wheezing GI: Denies abdominal pain, nausea, vomiting, diarrhea, constipation GU: Denies dysuria, hematuria, frequency, vaginal discharge Msk: Denies muscle cramps, joint pains Neuro: Denies weakness, numbness, tingling Skin: Denies rashes, bruising Psych: Denies depression, anxiety, hallucinations     Objective:    Blood pressure (!) 160/76, pulse 74, temperature 98.3 F (36.8 C), temperature source Oral, weight 132 lb (59.9 kg), SpO2 97 %.   Gen. Pleasant, well-nourished, in no distress, normal affect   HEENT: [place]/AT, face symmetric, no scleral icterus, PERRLA, nares patent without drainage Lungs: no accessory muscle use, CTAB, no wheezes or rales Cardiovascular: RRR, no m/r/g, no peripheral edema Neuro:   A&Ox3, CN II-XII intact, normal gait Skin:  Warm, no lesions/ rash   Wt Readings from Last 3 Encounters:  06/09/18 132 lb (59.9 kg)  03/09/18 132 lb 9.6 oz (60.1 kg)  12/07/17 135 lb (61.2 kg)    Lab Results  Component Value Date   WBC 9.4 11/04/2017   HGB 9.8 (L) 11/04/2017   HCT 28.7 (L) 11/04/2017   PLT 399.0 11/04/2017   GLUCOSE 189 (H) 11/04/2017   CHOL 238 (H) 10/21/2017   TRIG 630 (H) 10/21/2017   HDL <10 (L) 10/21/2017   LDLCALC UNABLE TO CALCULATE IF TRIGLYCERIDE OVER 400 mg/dL 10/21/2017   ALT 19 10/20/2017   AST 16 10/20/2017   NA 141 11/04/2017   K 5.0 11/04/2017   CL 103 11/04/2017   CREATININE 1.09 11/04/2017   BUN 28 (H) 11/04/2017   CO2 25 11/04/2017   INR 0.92 10/20/2017   HGBA1C 7.0 (A) 06/09/2018    Assessment/Plan:  Type 2 diabetes mellitus with hyperlipidemia ([facility])  -hgb a1c 7.0% -continue metformin 1000 mg in am. -continue lifestyle modifications -DM foot exam up to date--03/09/18 -Order for diabetic eye exam placed 11/04/17, will f/u on  - Plan: POCT glycosylated hemoglobin (Hb A1C)  Essential hypertension -uncontrolled -concerns about compliance given work schedule. -Medication bottles labeled according to what they treat as pt finds this helpful. -discussed eating less salty foods and checking bp at home. -continue losartan 50 mg daily, Coreg 6.25 mg twice daily, Norvasc 10 mg daily. -if bp elevated at next OFV will increase losartan   Coronary artery disease involving native coronary artery of  native heart without angina pectoris -continue lipitor 40 mg daily -last lipid panel 10/21/2017  Total cholesterol 238, Triglycerides 630  F/u in the next month for bp.  Grier Mitts, MD

## 2018-07-04 ENCOUNTER — Other Ambulatory Visit: Payer: Self-pay | Admitting: Family Medicine

## 2018-09-15 ENCOUNTER — Other Ambulatory Visit: Payer: Self-pay | Admitting: Family Medicine

## 2018-09-18 ENCOUNTER — Other Ambulatory Visit: Payer: Self-pay | Admitting: Family Medicine

## 2018-12-10 ENCOUNTER — Other Ambulatory Visit: Payer: Self-pay | Admitting: Family Medicine

## 2018-12-10 DIAGNOSIS — E1169 Type 2 diabetes mellitus with other specified complication: Secondary | ICD-10-CM

## 2018-12-10 DIAGNOSIS — E785 Hyperlipidemia, unspecified: Principal | ICD-10-CM

## 2020-06-09 ENCOUNTER — Emergency Department (HOSPITAL_COMMUNITY): Payer: 59

## 2020-06-09 ENCOUNTER — Encounter (HOSPITAL_COMMUNITY): Payer: Self-pay

## 2020-06-09 ENCOUNTER — Other Ambulatory Visit: Payer: Self-pay

## 2020-06-09 ENCOUNTER — Inpatient Hospital Stay (HOSPITAL_COMMUNITY)
Admission: EM | Admit: 2020-06-09 | Discharge: 2020-06-19 | DRG: 291 | Disposition: A | Discharge status: Home / Self Care | Payer: 59 | Attending: Internal Medicine | Admitting: Internal Medicine

## 2020-06-09 ENCOUNTER — Ambulatory Visit (HOSPITAL_COMMUNITY)
Admission: EM | Admit: 2020-06-09 | Discharge: 2020-06-09 | Disposition: A | Discharge status: Home / Self Care | Payer: 59

## 2020-06-09 DIAGNOSIS — I10 Essential (primary) hypertension: Secondary | ICD-10-CM | POA: Diagnosis present

## 2020-06-09 DIAGNOSIS — R0600 Dyspnea, unspecified: Secondary | ICD-10-CM

## 2020-06-09 DIAGNOSIS — N189 Chronic kidney disease, unspecified: Secondary | ICD-10-CM | POA: Diagnosis present

## 2020-06-09 DIAGNOSIS — Z23 Encounter for immunization: Secondary | ICD-10-CM

## 2020-06-09 DIAGNOSIS — I2510 Atherosclerotic heart disease of native coronary artery without angina pectoris: Secondary | ICD-10-CM

## 2020-06-09 DIAGNOSIS — Z7982 Long term (current) use of aspirin: Secondary | ICD-10-CM

## 2020-06-09 DIAGNOSIS — I248 Other forms of acute ischemic heart disease: Secondary | ICD-10-CM | POA: Diagnosis present

## 2020-06-09 DIAGNOSIS — E1169 Type 2 diabetes mellitus with other specified complication: Secondary | ICD-10-CM | POA: Diagnosis present

## 2020-06-09 DIAGNOSIS — I679 Cerebrovascular disease, unspecified: Secondary | ICD-10-CM | POA: Diagnosis present

## 2020-06-09 DIAGNOSIS — Z20822 Contact with and (suspected) exposure to covid-19: Secondary | ICD-10-CM | POA: Diagnosis present

## 2020-06-09 DIAGNOSIS — Z833 Family history of diabetes mellitus: Secondary | ICD-10-CM

## 2020-06-09 DIAGNOSIS — Z79899 Other long term (current) drug therapy: Secondary | ICD-10-CM

## 2020-06-09 DIAGNOSIS — N170 Acute kidney failure with tubular necrosis: Secondary | ICD-10-CM | POA: Diagnosis present

## 2020-06-09 DIAGNOSIS — R601 Generalized edema: Secondary | ICD-10-CM | POA: Diagnosis present

## 2020-06-09 DIAGNOSIS — Z951 Presence of aortocoronary bypass graft: Secondary | ICD-10-CM

## 2020-06-09 DIAGNOSIS — R609 Edema, unspecified: Secondary | ICD-10-CM | POA: Diagnosis not present

## 2020-06-09 DIAGNOSIS — Z7984 Long term (current) use of oral hypoglycemic drugs: Secondary | ICD-10-CM

## 2020-06-09 DIAGNOSIS — R0989 Other specified symptoms and signs involving the circulatory and respiratory systems: Secondary | ICD-10-CM | POA: Diagnosis present

## 2020-06-09 DIAGNOSIS — N179 Acute kidney failure, unspecified: Secondary | ICD-10-CM | POA: Diagnosis present

## 2020-06-09 DIAGNOSIS — Z9114 Patient's other noncompliance with medication regimen: Secondary | ICD-10-CM

## 2020-06-09 DIAGNOSIS — R7989 Other specified abnormal findings of blood chemistry: Secondary | ICD-10-CM | POA: Diagnosis present

## 2020-06-09 DIAGNOSIS — R0602 Shortness of breath: Secondary | ICD-10-CM | POA: Diagnosis not present

## 2020-06-09 DIAGNOSIS — I130 Hypertensive heart and chronic kidney disease with heart failure and stage 1 through stage 4 chronic kidney disease, or unspecified chronic kidney disease: Principal | ICD-10-CM | POA: Diagnosis present

## 2020-06-09 DIAGNOSIS — Z800 Family history of malignant neoplasm of digestive organs: Secondary | ICD-10-CM

## 2020-06-09 DIAGNOSIS — I5021 Acute systolic (congestive) heart failure: Secondary | ICD-10-CM | POA: Diagnosis present

## 2020-06-09 DIAGNOSIS — I509 Heart failure, unspecified: Secondary | ICD-10-CM

## 2020-06-09 DIAGNOSIS — E785 Hyperlipidemia, unspecified: Secondary | ICD-10-CM | POA: Diagnosis present

## 2020-06-09 DIAGNOSIS — N184 Chronic kidney disease, stage 4 (severe): Secondary | ICD-10-CM | POA: Diagnosis present

## 2020-06-09 DIAGNOSIS — R1900 Intra-abdominal and pelvic swelling, mass and lump, unspecified site: Secondary | ICD-10-CM | POA: Diagnosis present

## 2020-06-09 DIAGNOSIS — E1122 Type 2 diabetes mellitus with diabetic chronic kidney disease: Secondary | ICD-10-CM | POA: Diagnosis present

## 2020-06-09 DIAGNOSIS — R0902 Hypoxemia: Secondary | ICD-10-CM

## 2020-06-09 DIAGNOSIS — E874 Mixed disorder of acid-base balance: Secondary | ICD-10-CM | POA: Diagnosis present

## 2020-06-09 DIAGNOSIS — I160 Hypertensive urgency: Secondary | ICD-10-CM | POA: Diagnosis present

## 2020-06-09 DIAGNOSIS — E1165 Type 2 diabetes mellitus with hyperglycemia: Secondary | ICD-10-CM | POA: Diagnosis present

## 2020-06-09 LAB — CBC
HCT: 35.5 % — ABNORMAL LOW (ref 36.0–46.0)
Hemoglobin: 10.7 g/dL — ABNORMAL LOW (ref 12.0–15.0)
MCH: 28.7 pg (ref 26.0–34.0)
MCHC: 30.1 g/dL (ref 30.0–36.0)
MCV: 95.2 fL (ref 80.0–100.0)
Platelets: 190 10*3/uL (ref 150–400)
RBC: 3.73 MIL/uL — ABNORMAL LOW (ref 3.87–5.11)
RDW: 13.1 % (ref 11.5–15.5)
WBC: 6.2 10*3/uL (ref 4.0–10.5)
nRBC: 0 % (ref 0.0–0.2)

## 2020-06-09 LAB — COMPREHENSIVE METABOLIC PANEL
ALT: 16 U/L (ref 0–44)
AST: 12 U/L — ABNORMAL LOW (ref 15–41)
Albumin: 3.4 g/dL — ABNORMAL LOW (ref 3.5–5.0)
Alkaline Phosphatase: 63 U/L (ref 38–126)
Anion gap: 9 (ref 5–15)
BUN: 39 mg/dL — ABNORMAL HIGH (ref 8–23)
CO2: 27 mmol/L (ref 22–32)
Calcium: 8.9 mg/dL (ref 8.9–10.3)
Chloride: 106 mmol/L (ref 98–111)
Creatinine, Ser: 2.54 mg/dL — ABNORMAL HIGH (ref 0.44–1.00)
GFR calc Af Amer: 22 mL/min — ABNORMAL LOW (ref >60)
GFR calc non Af Amer: 19 mL/min — ABNORMAL LOW (ref >60)
Glucose, Bld: 151 mg/dL — ABNORMAL HIGH (ref 70–99)
Potassium: 5.1 mmol/L (ref 3.5–5.1)
Sodium: 142 mmol/L (ref 135–145)
Total Bilirubin: 0.6 mg/dL (ref 0.3–1.2)
Total Protein: 5.8 g/dL — ABNORMAL LOW (ref 6.5–8.1)

## 2020-06-09 NOTE — ED Triage Notes (Signed)
Pt arrives POV for eval of progressively worsening SOB and blt LE edema x 2-3 mos. Pt reports her husband made her come in today d/t sx not improving. States they are not acutely worse today, she just doesn't like doctors,.

## 2020-06-09 NOTE — ED Notes (Signed)
Patient is being discharged from the Urgent Care and sent to the Emergency Department via POV . Per Jaynee Eagles, PA, patient is in need of higher level of care due to hx cardiac disease, pitting edema, hypoxia, uncontrolled HTN. Patient is aware and verbalizes understanding of plan of care.  Vitals:   06/09/20 1830  BP: (!) 187/89  Pulse: (!) 103  Resp: 16  Temp: 98.4 F (36.9 C)  SpO2: 91%

## 2020-06-09 NOTE — Discharge Instructions (Signed)
Please report to the emergency room now for full work up of acute congestive heart failure given your hypoxemia, dyspnea, tachycardia and elevated blood pressure reading in the setting of coronary artery disease and uncontrolled hypertension.

## 2020-06-09 NOTE — ED Provider Notes (Signed)
[place]   MRN: [id number] DOB: 1956/09/21  Subjective:   Beverly Martin is a 64 y.o. female presenting for 40-month history of worsening bilateral lower extremity swelling, dyspnea.  Patient has a history of hypertension, CAD.  States that she has not been taking her blood pressure medications.  Denies any active chest pain, headache, confusion.  No current facility-administered medications for this encounter.  Current Outpatient Medications:  .  amLODipine (NORVASC) 10 MG tablet, Take 1 tablet (10 mg total) by mouth daily., Disp: 90 tablet, Rfl: 3 .  atorvastatin (LIPITOR) 40 MG tablet, TAKE 1 TABLET (40 MG TOTAL) BY MOUTH DAILY AT 6 PM, Disp: 30 tablet, Rfl: 1 .  blood glucose meter kit and supplies, Dispense based on patient and insurance preference. Use up to four times daily as directed. (FOR ICD-10 E10.9, E11.9)., Disp: 1 each, Rfl: 0 .  carvedilol (COREG) 6.25 MG tablet, TAKE 1 TABLET TWICE A DAY WITH A MEAL, Disp: 60 tablet, Rfl: 0 .  glipiZIDE (GLUCOTROL XL) 5 MG 24 hr tablet, Take 5 mg by mouth daily with breakfast., Disp: , Rfl: 0 .  glucose blood test strip, Use as instructed, Disp: 100 each, Rfl: 12 .  losartan (COZAAR) 50 MG tablet, Take 1 tablet (50 mg total) by mouth daily., Disp: 90 tablet, Rfl: 3 .  metFORMIN (GLUCOPHAGE) 1000 MG tablet, TAKE 1 TABLET BY MOUTH EVERY DAY WITH BREAKFAST, Disp: 30 tablet, Rfl: 1 .  polyethylene glycol (MIRALAX / GLYCOLAX) packet, Take 17 g by mouth daily as needed for moderate constipation., Disp: 7 each, Rfl: 0   No Known Allergies  Past Medical History:  Diagnosis Date  . CAD (coronary artery disease)   . Diabetes mellitus without complication ([place])   . Essential hypertension   . Hyperlipidemia      Past Surgical History:  Procedure Laterality Date  . CARDIAC SURGERY     CABG    Family History  Problem Relation Age of Onset  . Diabetes Mellitus II Mother   . Esophageal cancer Father     Social History    Tobacco Use  . Smoking status: Never Smoker  . Smokeless tobacco: Never Used  Vaping Use  . Vaping Use: Never used  Substance Use Topics  . Alcohol use: No  . Drug use: No    ROS   Objective:   Vitals: BP (!) 187/89   Pulse (!) 103   Temp 98.4 F (36.9 C)   Resp 16   SpO2 91%   Physical Exam Constitutional:      General: She is not in acute distress.    Appearance: Normal appearance. She is well-developed. She is not ill-appearing, toxic-appearing or diaphoretic.  HENT:     Head: Normocephalic and atraumatic.     Right Ear: External ear normal.     Left Ear: External ear normal.     Nose: Nose normal.     Mouth/Throat:     Mouth: Mucous membranes are moist.     Pharynx: Oropharynx is clear.  Eyes:     General: No scleral icterus.       Right eye: No discharge.        Left eye: No discharge.     Extraocular Movements: Extraocular movements intact.     Conjunctiva/sclera: Conjunctivae normal.     Pupils: Pupils are equal, round, and reactive to light.  Cardiovascular:     Rate and Rhythm: Normal rate and regular rhythm.     Pulses:  Normal pulses.     Heart sounds: Normal heart sounds. No murmur heard.  No friction rub. No gallop.   Pulmonary:     Effort: Pulmonary effort is normal. No respiratory distress.     Breath sounds: No stridor. Rales (Mid to lower lung fields bilaterally) present. No wheezing or rhonchi.  Skin:    General: Skin is warm and dry.     Findings: No rash.  Neurological:     General: No focal deficit present.     Mental Status: She is alert and oriented to person, place, and time.  Psychiatric:        Mood and Affect: Mood normal.        Behavior: Behavior normal.        Thought Content: Thought content normal.        Judgment: Judgment normal.      Assessment and Plan :   PDMP not reviewed this encounter.  1. Peripheral edema   2. Dyspnea, unspecified type   3. Coronary artery disease without angina pectoris, unspecified  vessel or lesion type, unspecified whether native or transplanted heart   4. Hypoxemia     Patient is hypoxic, has uncontrolled hypertension and tachycardic.  High suspicion for acute CHF, possible COVID-19.  Emphasized need to be evaluated and stabilized in the emergency room.  Patient will have her father transport her there now.   Jaynee Eagles, [place] 06/09/20 1910

## 2020-06-09 NOTE — ED Triage Notes (Signed)
Patient reports bilateral lower extremity swelling. Pitting edema on exam. Patient reports this has been going on for at least three months. Patient also reports dyspnea on exertion.

## 2020-06-10 ENCOUNTER — Inpatient Hospital Stay (HOSPITAL_COMMUNITY): Payer: 59

## 2020-06-10 ENCOUNTER — Encounter (HOSPITAL_COMMUNITY): Payer: Self-pay | Admitting: Internal Medicine

## 2020-06-10 DIAGNOSIS — I5033 Acute on chronic diastolic (congestive) heart failure: Secondary | ICD-10-CM | POA: Diagnosis not present

## 2020-06-10 DIAGNOSIS — Z7982 Long term (current) use of aspirin: Secondary | ICD-10-CM | POA: Diagnosis not present

## 2020-06-10 DIAGNOSIS — I2510 Atherosclerotic heart disease of native coronary artery without angina pectoris: Secondary | ICD-10-CM | POA: Diagnosis present

## 2020-06-10 DIAGNOSIS — E1165 Type 2 diabetes mellitus with hyperglycemia: Secondary | ICD-10-CM | POA: Diagnosis present

## 2020-06-10 DIAGNOSIS — I5021 Acute systolic (congestive) heart failure: Secondary | ICD-10-CM

## 2020-06-10 DIAGNOSIS — E1122 Type 2 diabetes mellitus with diabetic chronic kidney disease: Secondary | ICD-10-CM | POA: Diagnosis present

## 2020-06-10 DIAGNOSIS — I509 Heart failure, unspecified: Secondary | ICD-10-CM

## 2020-06-10 DIAGNOSIS — I5043 Acute on chronic combined systolic (congestive) and diastolic (congestive) heart failure: Secondary | ICD-10-CM | POA: Diagnosis not present

## 2020-06-10 DIAGNOSIS — Z800 Family history of malignant neoplasm of digestive organs: Secondary | ICD-10-CM | POA: Diagnosis not present

## 2020-06-10 DIAGNOSIS — R601 Generalized edema: Secondary | ICD-10-CM | POA: Diagnosis not present

## 2020-06-10 DIAGNOSIS — Z7984 Long term (current) use of oral hypoglycemic drugs: Secondary | ICD-10-CM | POA: Diagnosis not present

## 2020-06-10 DIAGNOSIS — Z23 Encounter for immunization: Secondary | ICD-10-CM | POA: Diagnosis not present

## 2020-06-10 DIAGNOSIS — R7989 Other specified abnormal findings of blood chemistry: Secondary | ICD-10-CM | POA: Diagnosis present

## 2020-06-10 DIAGNOSIS — E874 Mixed disorder of acid-base balance: Secondary | ICD-10-CM | POA: Diagnosis present

## 2020-06-10 DIAGNOSIS — N170 Acute kidney failure with tubular necrosis: Secondary | ICD-10-CM | POA: Diagnosis present

## 2020-06-10 DIAGNOSIS — I160 Hypertensive urgency: Secondary | ICD-10-CM | POA: Diagnosis present

## 2020-06-10 DIAGNOSIS — N184 Chronic kidney disease, stage 4 (severe): Secondary | ICD-10-CM | POA: Diagnosis present

## 2020-06-10 DIAGNOSIS — E785 Hyperlipidemia, unspecified: Secondary | ICD-10-CM | POA: Diagnosis present

## 2020-06-10 DIAGNOSIS — E1169 Type 2 diabetes mellitus with other specified complication: Secondary | ICD-10-CM | POA: Diagnosis present

## 2020-06-10 DIAGNOSIS — Z833 Family history of diabetes mellitus: Secondary | ICD-10-CM | POA: Diagnosis not present

## 2020-06-10 DIAGNOSIS — Z79899 Other long term (current) drug therapy: Secondary | ICD-10-CM | POA: Diagnosis not present

## 2020-06-10 DIAGNOSIS — R0989 Other specified symptoms and signs involving the circulatory and respiratory systems: Secondary | ICD-10-CM | POA: Diagnosis present

## 2020-06-10 DIAGNOSIS — I130 Hypertensive heart and chronic kidney disease with heart failure and stage 1 through stage 4 chronic kidney disease, or unspecified chronic kidney disease: Secondary | ICD-10-CM | POA: Diagnosis present

## 2020-06-10 DIAGNOSIS — I248 Other forms of acute ischemic heart disease: Secondary | ICD-10-CM | POA: Diagnosis present

## 2020-06-10 DIAGNOSIS — Z9114 Patient's other noncompliance with medication regimen: Secondary | ICD-10-CM | POA: Diagnosis not present

## 2020-06-10 DIAGNOSIS — Z20822 Contact with and (suspected) exposure to covid-19: Secondary | ICD-10-CM | POA: Diagnosis present

## 2020-06-10 DIAGNOSIS — I679 Cerebrovascular disease, unspecified: Secondary | ICD-10-CM | POA: Diagnosis present

## 2020-06-10 DIAGNOSIS — R0602 Shortness of breath: Secondary | ICD-10-CM | POA: Diagnosis present

## 2020-06-10 DIAGNOSIS — Z951 Presence of aortocoronary bypass graft: Secondary | ICD-10-CM | POA: Diagnosis not present

## 2020-06-10 LAB — GLUCOSE, CAPILLARY: Glucose-Capillary: 170 mg/dL — ABNORMAL HIGH (ref 70–99)

## 2020-06-10 LAB — URINALYSIS, ROUTINE W REFLEX MICROSCOPIC
Bilirubin Urine: NEGATIVE
Glucose, UA: NEGATIVE mg/dL
Ketones, ur: NEGATIVE mg/dL
Leukocytes,Ua: NEGATIVE
Nitrite: NEGATIVE
Protein, ur: 100 mg/dL — AB
Specific Gravity, Urine: 1.008 (ref 1.005–1.030)
pH: 5 (ref 5.0–8.0)

## 2020-06-10 LAB — LIPID PANEL
Cholesterol: 245 mg/dL — ABNORMAL HIGH (ref 0–200)
HDL: 69 mg/dL (ref >40)
LDL Cholesterol: 157 mg/dL — ABNORMAL HIGH (ref 0–99)
Total CHOL/HDL Ratio: 3.6 RATIO
Triglycerides: 93 mg/dL (ref <150)
VLDL: 19 mg/dL (ref 0–40)

## 2020-06-10 LAB — HEMOGLOBIN A1C
Hgb A1c MFr Bld: 8.2 % — ABNORMAL HIGH (ref 4.8–5.6)
Mean Plasma Glucose: 188.64 mg/dL

## 2020-06-10 LAB — RAPID URINE DRUG SCREEN, HOSP PERFORMED
Amphetamines: NOT DETECTED
Barbiturates: NOT DETECTED
Benzodiazepines: NOT DETECTED
Cocaine: NOT DETECTED
Opiates: NOT DETECTED
Tetrahydrocannabinol: NOT DETECTED

## 2020-06-10 LAB — BRAIN NATRIURETIC PEPTIDE: B Natriuretic Peptide: 3758.8 pg/mL — ABNORMAL HIGH (ref 0.0–100.0)

## 2020-06-10 LAB — CBG MONITORING, ED
Glucose-Capillary: 105 mg/dL — ABNORMAL HIGH (ref 70–99)
Glucose-Capillary: 146 mg/dL — ABNORMAL HIGH (ref 70–99)
Glucose-Capillary: 157 mg/dL — ABNORMAL HIGH (ref 70–99)

## 2020-06-10 LAB — HIV ANTIBODY (ROUTINE TESTING W REFLEX): HIV Screen 4th Generation wRfx: NONREACTIVE

## 2020-06-10 LAB — ECHOCARDIOGRAM COMPLETE
Area-P 1/2: 5.02 cm2
Height: 63 in
S' Lateral: 3.7 cm
Weight: 2560 oz

## 2020-06-10 LAB — TSH: TSH: 2.053 u[IU]/mL (ref 0.350–4.500)

## 2020-06-10 LAB — TROPONIN I (HIGH SENSITIVITY)
Troponin I (High Sensitivity): 31 ng/L — ABNORMAL HIGH (ref <18)
Troponin I (High Sensitivity): 32 ng/L — ABNORMAL HIGH (ref <18)

## 2020-06-10 LAB — SARS CORONAVIRUS 2 BY RT PCR (HOSPITAL ORDER, PERFORMED IN ~~LOC~~ HOSPITAL LAB): SARS Coronavirus 2: NEGATIVE

## 2020-06-10 MED ORDER — CARVEDILOL 6.25 MG PO TABS
6.2500 mg | ORAL_TABLET | Freq: Two times a day (BID) | ORAL | Status: DC
  Administered 2020-06-10 – 2020-06-19 (×18): 6.25 mg via ORAL
  Filled 2020-06-10 (×18): qty 1

## 2020-06-10 MED ORDER — HYDRALAZINE HCL 20 MG/ML IJ SOLN
5.0000 mg | INTRAMUSCULAR | Status: DC | PRN
  Administered 2020-06-10 (×2): 5 mg via INTRAVENOUS
  Filled 2020-06-10 (×2): qty 1

## 2020-06-10 MED ORDER — SODIUM CHLORIDE 0.9% FLUSH
3.0000 mL | INTRAVENOUS | Status: DC | PRN
  Administered 2020-06-11: 3 mL via INTRAVENOUS

## 2020-06-10 MED ORDER — ENOXAPARIN SODIUM 30 MG/0.3ML ~~LOC~~ SOLN
30.0000 mg | SUBCUTANEOUS | Status: DC
  Administered 2020-06-10 – 2020-06-18 (×9): 30 mg via SUBCUTANEOUS
  Filled 2020-06-10 (×10): qty 0.3

## 2020-06-10 MED ORDER — SODIUM CHLORIDE 0.9% FLUSH
3.0000 mL | Freq: Two times a day (BID) | INTRAVENOUS | Status: DC
  Administered 2020-06-10 – 2020-06-19 (×14): 3 mL via INTRAVENOUS

## 2020-06-10 MED ORDER — ATORVASTATIN CALCIUM 40 MG PO TABS
40.0000 mg | ORAL_TABLET | Freq: Every day | ORAL | Status: DC
  Administered 2020-06-10 – 2020-06-19 (×10): 40 mg via ORAL
  Filled 2020-06-10 (×10): qty 1

## 2020-06-10 MED ORDER — INSULIN ASPART 100 UNIT/ML ~~LOC~~ SOLN
0.0000 [IU] | Freq: Three times a day (TID) | SUBCUTANEOUS | Status: DC
  Administered 2020-06-10: 2 [IU] via SUBCUTANEOUS
  Administered 2020-06-11: 5 [IU] via SUBCUTANEOUS
  Administered 2020-06-12 – 2020-06-13 (×3): 2 [IU] via SUBCUTANEOUS
  Administered 2020-06-13 – 2020-06-14 (×2): 3 [IU] via SUBCUTANEOUS
  Administered 2020-06-14 – 2020-06-15 (×3): 2 [IU] via SUBCUTANEOUS
  Administered 2020-06-15 (×2): 3 [IU] via SUBCUTANEOUS
  Administered 2020-06-16: 2 [IU] via SUBCUTANEOUS
  Administered 2020-06-16: 3 [IU] via SUBCUTANEOUS
  Administered 2020-06-16: 2 [IU] via SUBCUTANEOUS
  Administered 2020-06-17 – 2020-06-18 (×3): 3 [IU] via SUBCUTANEOUS

## 2020-06-10 MED ORDER — INSULIN ASPART 100 UNIT/ML ~~LOC~~ SOLN
0.0000 [IU] | Freq: Every day | SUBCUTANEOUS | Status: DC
  Administered 2020-06-12 – 2020-06-17 (×2): 2 [IU] via SUBCUTANEOUS

## 2020-06-10 MED ORDER — FUROSEMIDE 10 MG/ML IJ SOLN
40.0000 mg | Freq: Two times a day (BID) | INTRAMUSCULAR | Status: DC
  Administered 2020-06-10 – 2020-06-11 (×2): 40 mg via INTRAVENOUS
  Filled 2020-06-10 (×2): qty 4

## 2020-06-10 MED ORDER — SODIUM CHLORIDE 0.9 % IV SOLN: 250.0000 mL | INTRAVENOUS | Status: DC | PRN

## 2020-06-10 MED ORDER — ASPIRIN EC 81 MG PO TBEC
81.0000 mg | DELAYED_RELEASE_TABLET | Freq: Every day | ORAL | Status: DC
  Administered 2020-06-10 – 2020-06-19 (×10): 81 mg via ORAL
  Filled 2020-06-10 (×10): qty 1

## 2020-06-10 MED ORDER — LABETALOL HCL 5 MG/ML IV SOLN
10.0000 mg | Freq: Once | INTRAVENOUS | Status: AC
  Administered 2020-06-10: 10 mg via INTRAVENOUS
  Filled 2020-06-10: qty 4

## 2020-06-10 MED ORDER — FUROSEMIDE 10 MG/ML IJ SOLN
40.0000 mg | Freq: Once | INTRAMUSCULAR | Status: AC
  Administered 2020-06-10: 40 mg via INTRAVENOUS
  Filled 2020-06-10: qty 4

## 2020-06-10 MED ORDER — PNEUMOCOCCAL VAC POLYVALENT 25 MCG/0.5ML IJ INJ
0.5000 mL | INJECTION | INTRAMUSCULAR | Status: AC
  Administered 2020-06-11: 0.5 mL via INTRAMUSCULAR
  Filled 2020-06-10: qty 0.5

## 2020-06-10 NOTE — TOC Initial Note (Signed)
Transition of Care [facility]) - Initial/Assessment Note    Patient Details  Name: Beverly Martin MRN: [id number] Date of Birth: 10-Oct-1956  [facility] CM/SW Contact:    Verdell Carmine, RN Phone Number: 06/10/2020, 4:12 PM  Clinical Narrative:                 Patient admitted still in ED. Short of breath, has not been able to take medication in 2-3 months. Due to being laid off.  As a result now has increased anasarca and hypertensive crisis. BP in 190's. Systolic. Patient has [place] . Will be followed by CM during her stay. In hospital. IWll follow for needs.   Expected Discharge Plan: [place] Barriers to Discharge: Continued Medical Work up   Patient Goals and CMS Choice        Expected Discharge Plan and Services Expected Discharge Plan: [place]                                              Prior Living Arrangements/Services     Patient language and need for interpreter reviewed:: Yes        Need for Family Participation in Patient Care: Yes (Comment) Care giver support system in place?: Yes (comment)   Criminal Activity/Legal Involvement Pertinent to Current Situation/Hospitalization: No - Comment as needed  Activities of Daily Living      Permission Sought/Granted Permission sought to share information with : Case Manager                Emotional Assessment       Orientation: : Oriented to Self, Oriented to Place, Oriented to  Time, Oriented to Situation   Psych Involvement: No (comment)  Admission diagnosis:  New onset of congestive heart failure ([place]) [I50.9] Patient Active Problem List   Diagnosis Date Noted  . Anasarca 06/10/2020  . Dyslipidemia 06/10/2020  . Positive blood culture 10/21/2017  . UTI (urinary tract infection) 10/20/2017  . Sepsis ([place]) 10/20/2017  . AKI (acute kidney injury) ([place]) 10/20/2017  . Elevated troponin 10/20/2017  . Essential hypertension   . Type 2  diabetes mellitus with hyperlipidemia ([place])   . CAD (coronary artery disease)    PCP:  Billie Ruddy, MD Pharmacy:   [id number] - [place], [place] [address] [place] [address] Phone: [phone] Fax: [phone]     Social Determinants of Health (SDOH) Interventions    Readmission Risk Interventions No flowsheet data found.

## 2020-06-10 NOTE — ED Notes (Signed)
Pt reports she has not been going to MD and has not been going to her PCP . Pt normally takes Lasix , oral agents for DM. Pt also takes 3-4 pills for BP. Pt has not had meds for a unknown  Time. Pt PCP  [facility]. Pt has not called her PCP for a long time.

## 2020-06-10 NOTE — Progress Notes (Signed)
  Echocardiogram 2D Echocardiogram has been performed.  Beverly Martin 06/10/2020, 5:02 PM

## 2020-06-10 NOTE — ED Provider Notes (Signed)
[place] [facility] EMERGENCY DEPARTMENT Provider Note   CSN: [id number] Arrival date & time: 06/09/20  1846     History Chief Complaint  Patient presents with  . Leg Swelling  . Shortness of Breath    Beverly Martin is a 64 y.o. female with a history of CAD S/p CABG, hypertension, hyperlipidemia, & diabetes mellitus who presents to the ED with complaints of progressively worsening lower extremity swelling & dyspnea x 2 months. Patient states her bilateral LEs are swollen in symmetric fashion, sometimes it feels like her abdomen is swollen as well, and she is having intermittent shortness of breath. Dyspnea is worse in supine position, no other significant alleviating/aggravating factors. She unfortunately was laid off recently and could not afford to follow up with her doctors or obtain her medications therefore she has not had any of her prescriptions for the past 2-3 months.  She denies fever, chills, cough, chest pain, abdominal pain, nausea, vomiting, dizziness, lightheadedness, or syncope.  She denies any history of heart failure.  She has received both doses of her COVID-19 vaccine that lady.    HPI     Past Medical History:  Diagnosis Date  . CAD (coronary artery disease)   . Diabetes mellitus without complication ([place])   . Essential hypertension   . Hyperlipidemia     Patient Active Problem List   Diagnosis Date Noted  . Positive blood culture 10/21/2017  . UTI (urinary tract infection) 10/20/2017  . Sepsis ([place]) 10/20/2017  . AKI (acute kidney injury) ([place]) 10/20/2017  . Elevated troponin 10/20/2017  . Essential hypertension   . Type 2 diabetes mellitus with hyperlipidemia ([place])   . CAD (coronary artery disease)     Past Surgical History:  Procedure Laterality Date  . CARDIAC SURGERY     CABG     OB History   No obstetric history on file.     Family History  Problem Relation Age of Onset  . Diabetes Mellitus II Mother   . Esophageal cancer  Father     Social History   Tobacco Use  . Smoking status: Never Smoker  . Smokeless tobacco: Never Used  Vaping Use  . Vaping Use: Never used  Substance Use Topics  . Alcohol use: No  . Drug use: No    Home Medications Prior to Admission medications   Medication Sig Start Date End Date Taking? Authorizing Provider  amLODipine (NORVASC) 10 MG tablet Take 1 tablet (10 mg total) by mouth daily. Patient not taking: Reported on 06/10/2020 12/07/17 01/06/18  Billie Ruddy, MD  atorvastatin (LIPITOR) 40 MG tablet TAKE 1 TABLET (40 MG TOTAL) BY MOUTH DAILY AT 6 PM Patient not taking: Reported on 06/10/2020 12/10/18 01/09/19  Billie Ruddy, MD  blood glucose meter kit and supplies Dispense based on patient and insurance preference. Use up to four times daily as directed. (FOR ICD-10 E10.9, E11.9). Patient not taking: Reported on 06/10/2020 10/23/17   Shelly Coss, MD  carvedilol (COREG) 6.25 MG tablet TAKE 1 TABLET TWICE A DAY WITH A MEAL Patient not taking: Reported on 06/10/2020 09/21/18   Billie Ruddy, MD  glipiZIDE (GLUCOTROL XL) 5 MG 24 hr tablet Take 5 mg by mouth daily with breakfast. Patient not taking: Reported on 06/10/2020 10/23/17   [provider]  glucose blood test strip Use as instructed Patient not taking: Reported on 06/10/2020 11/04/17   Billie Ruddy, MD  losartan (COZAAR) 50 MG tablet Take 1 tablet (50 mg  total) by mouth daily. Patient not taking: Reported on 06/10/2020 03/09/18   Billie Ruddy, MD  metFORMIN (GLUCOPHAGE) 1000 MG tablet TAKE 1 TABLET BY MOUTH EVERY DAY WITH BREAKFAST Patient not taking: TAKE 1 TABLET BY MOUTH EVERY DAY WITH BREAKFAST 09/15/18   Billie Ruddy, MD  polyethylene glycol (MIRALAX / GLYCOLAX) packet Take 17 g by mouth daily as needed for moderate constipation. Patient not taking: Reported on 06/10/2020 10/23/17   Shelly Coss, MD    Allergies    Patient has no known allergies.  Review of Systems   Review of Systems    Constitutional: Negative for chills and fever.  Respiratory: Positive for shortness of breath. Negative for cough.   Cardiovascular: Positive for leg swelling. Negative for chest pain.  Gastrointestinal: Negative for abdominal pain, diarrhea, nausea and vomiting.  Neurological: Negative for dizziness, syncope and light-headedness.  All other systems reviewed and are negative.   Physical Exam Updated Vital Signs BP (!) 199/95   Pulse 88   Temp 97.7 F (36.5 C) (Oral)   Resp (!) 24   Ht $R'5\' 3"'cL$  (1.6 m)   Wt 72.6 kg   SpO2 96%   BMI 28.34 kg/m   Physical Exam Vitals and nursing note reviewed.  Constitutional:      General: She is not in acute distress.    Appearance: She is well-developed. She is not toxic-appearing.  HENT:     Head: Normocephalic and atraumatic.  Eyes:     General:        Right eye: No discharge.        Left eye: No discharge.     Conjunctiva/sclera: Conjunctivae normal.  Cardiovascular:     Rate and Rhythm: Normal rate and regular rhythm.  Pulmonary:     Effort: Tachypnea present. No respiratory distress.     Breath sounds: Decreased breath sounds ([place]) present. No wheezing, rhonchi or rales.  Abdominal:     General: There is no distension.     Palpations: Abdomen is soft.     Tenderness: There is no abdominal tenderness.  Musculoskeletal:     Cervical back: Neck supple.     Comments: 2-3+ symmetric pitting edema to the bilateral lower legs.  No overlying erythema/warmth.  No calf tenderness.  Skin:    General: Skin is warm and dry.     Findings: No rash.  Neurological:     Mental Status: She is alert.     Comments: Clear speech.   Psychiatric:        Behavior: Behavior normal.     ED Results / Procedures / Treatments   Labs (all labs ordered are listed, but only abnormal results are displayed) Labs Reviewed  CBC - Abnormal; Notable for the following components:      Result Value   RBC 3.73 (*)    Hemoglobin 10.7 (*)    HCT 35.5  (*)    All other components within normal limits  COMPREHENSIVE METABOLIC PANEL - Abnormal; Notable for the following components:   Glucose, Bld 151 (*)    BUN 39 (*)    Creatinine, Ser 2.54 (*)    Total Protein 5.8 (*)    Albumin 3.4 (*)    AST 12 (*)    GFR calc non Af Amer 19 (*)    GFR calc Af Amer 22 (*)    All other components within normal limits  SARS CORONAVIRUS 2 BY RT PCR (HOSPITAL ORDER, [facility])  BRAIN NATRIURETIC PEPTIDE  TROPONIN I (HIGH SENSITIVITY)    EKG EKG Interpretation  Date/Time:  Saturday June 09 2020 19:16:59 EDT Ventricular Rate:  103 PR Interval:  132 QRS Duration: 100 QT Interval:  352 QTC Calculation: 461 R Axis:   106 Text Interpretation: Sinus tachycardia Rightward axis Marked ST abnormality, possible inferior subendocardial injury Abnormal ECG No significant change since last tracing Confirmed by Quintella Reichert [phone]) on 06/10/2020 8:36:34 AM   Radiology DG Chest 2 View  Result Date: 06/09/2020 CLINICAL DATA:  Shortness of breath and bilateral lower extremity edema times 2-3 months. EXAM: CHEST - 2 VIEW COMPARISON:  October 20, 2017 FINDINGS: Multiple sternal wires and vascular clips are seen. Mild prominence of the pulmonary vasculature is noted. Mild left basilar atelectasis is present. Small bilateral pleural effusions are seen. No pneumothorax is identified. The cardiac silhouette is mildly enlarged. The visualized skeletal structures are unremarkable. IMPRESSION: 1. Mild left basilar atelectasis with additional findings suggestive of mild pulmonary vascular congestion. 2. Small bilateral pleural effusions. Electronically Signed   By: Virgina Norfolk M.D.   On: 06/09/2020 19:43    Procedures Procedures (including critical care time)  Medications Ordered in ED Medications  furosemide (LASIX) injection 40 mg (has no administration in time range)  labetalol (NORMODYNE) injection 10 mg (has no administration  in time range)    ED Course  I have reviewed the triage vital signs and the nursing notes.  Pertinent labs & imaging results that were available during my care of the patient were reviewed by me and considered in my medical decision making (see chart for details).    SHAILEY BUTTERBAUGH was evaluated in Emergency Department on 06/10/2020 for the symptoms described in the history of present illness. He/she was evaluated in the context of the global COVID-19 pandemic, which necessitated consideration that the patient might be at risk for infection with the SARS-CoV-2 virus that causes COVID-19. Institutional protocols and algorithms that pertain to the evaluation of patients at risk for COVID-19 are in a state of rapid change based on information released by regulatory bodies including the CDC and federal and state organizations. These policies and algorithms were followed during the patient's care in the ED.  MDM Rules/Calculators/A&P                          Patient presents to the ED with complaints of bilateral lower leg edema and shortness of breath.  She is nontoxic, her blood pressure is notably elevated, SPO2 on room air is within normal limits on my exam, however patient is mildly tachypneic.  She has decreased breath sounds at the bases as well as symmetric pitting edema to the bilateral lower legs.    DDX: CHF, pneumonia, DVT/PE, critical anemia, ACS.   Additional history obtained:  Additional history obtained from chart review & nursing note review. Previous records obtained and reviewed: Last echo was in 10/2017, EF 50 to 55%.  Lab Tests:  I reviewed and interpreted labs, which included:  CBC: Anemia that is similar to prior. CMP: AKI Troponin: 31 BNP: Pending COVID: Pending.   Imaging Studies ordered:  CXR per triage ordered, I independently visualized and interpreted imaging which showed 1. Mild left basilar atelectasis with additional findings suggestive of mild pulmonary  vascular congestion. 2. Small bilateral pleural effusions.  Concern for CHF w/ AKI.  IV lasix for diuresis & IV labetalol for hypertension.  Will discuss w/ hospitalist service for admission, patient  in agreement.This is a shared visit with supervising physician Dr. Ralene Bathe who has independently evaluated patient & provided guidance in evaluation/management/disposition, in agreement with care.   11:11: CONSULT: Discussed case with hospitlaist Dr. Lorin Mercy- accepts admission.   Portions of this note were generated with [employer]. Dictation errors may occur despite best attempts at proofreading.  Final Clinical Impression(s) / ED Diagnoses Final diagnoses:  Congestive heart failure, unspecified HF chronicity, unspecified heart failure type ([place])  AKI (acute kidney injury) [facility])    Rx / DC Orders ED Discharge Orders    None       Leafy Kindle 06/10/20 1112    Quintella Reichert, MD 06/12/20 1311

## 2020-06-10 NOTE — ED Notes (Signed)
Meal ordered

## 2020-06-10 NOTE — ED Notes (Signed)
Dinner tray delivered.

## 2020-06-10 NOTE — ED Notes (Signed)
Patient transported to Ultrasound 

## 2020-06-10 NOTE — H&P (Signed)
History and Physical    NECOLE Martin TDD:[id number] DOB: Mar 22, 1956 DOA: 06/09/2020  PCP: Billie Ruddy, MD (appears to have last seen her 2 years ago) Consultants:  None Patient coming from:  Home - lives with husband, [name]; Bunk Foss: Husband, [phone]  Chief Complaint: LE edema  HPI: Beverly Martin is a 64 y.o. female with medical history significant of HTN; HLD: DM; and CAD s/p CABG presenting with LE edema.  She came because her husband made her come - "I'm bad about not going to doctors."  She denies feeling bad but has had LE edema.  She has been working Health and safety inspector, standing all day).  Edema has been progressive for 2-3 months but worsened about 1 month ago.  Some SOB with exertion.  No cough.  No orthopnea.  No PND.  No chest pain.  She has been out of her medications for a year or two - she has not been taking any medications at all.      ED Course:  Likely new-onset/undiagnosed CHF.  Lost job a couple of months ago and couldn't afford to go to the doctor, doctor wouldn't refill meds.  Progressive edema, SOB.  2-3+ LE edema, edema on CXR.  Has AKI (creatinine 2.54).  Given Lasix and Labetolol.    Review of Systems: As per HPI; otherwise review of systems reviewed and negative.   Ambulatory Status:  Ambulates without assistance  COVID Vaccine Status:   Complete  Past Medical History:  Diagnosis Date  . CAD (coronary artery disease)   . Diabetes mellitus without complication ([place])   . Essential hypertension   . Hyperlipidemia     Past Surgical History:  Procedure Laterality Date  . CARDIAC SURGERY     CABG    Social History   Socioeconomic History  . Marital status: Married    Spouse name: Not on file  . Number of children: Not on file  . Years of education: Not on file  . Highest education level: Not on file  Occupational History  . Not on file  Tobacco Use  . Smoking status: Never Smoker  . Smokeless tobacco: Never Used  Vaping Use  . Vaping  Use: Never used  Substance and Sexual Activity  . Alcohol use: No  . Drug use: No  . Sexual activity: Never  Other Topics Concern  . Not on file  Social History Narrative  . Not on file   Social Determinants of Health   Financial Resource Strain:   . Difficulty of Paying Living Expenses: Not on file  Food Insecurity:   . Worried About Charity fundraiser in the Last Year: Not on file  . Ran Out of Food in the Last Year: Not on file  Transportation Needs:   . Lack of Transportation (Medical): Not on file  . Lack of Transportation (Non-Medical): Not on file  Physical Activity:   . Days of Exercise per Week: Not on file  . Minutes of Exercise per Session: Not on file  Stress:   . Feeling of Stress : Not on file  Social Connections:   . Frequency of Communication with Friends and Family: Not on file  . Frequency of Social Gatherings with Friends and Family: Not on file  . Attends Religious Services: Not on file  . Active Member of Clubs or Organizations: Not on file  . Attends Archivist Meetings: Not on file  . Marital Status: Not on file  Intimate Partner Violence:   .  Fear of Current or Ex-Partner: Not on file  . Emotionally Abused: Not on file  . Physically Abused: Not on file  . Sexually Abused: Not on file    No Known Allergies  Family History  Problem Relation Age of Onset  . Diabetes Mellitus II Mother   . Esophageal cancer Father     Prior to Admission medications   Medication Sig Start Date End Date Taking? Authorizing Provider  amLODipine (NORVASC) 10 MG tablet Take 1 tablet (10 mg total) by mouth daily. Patient not taking: Reported on 06/10/2020 12/07/17 01/06/18  Billie Ruddy, MD  atorvastatin (LIPITOR) 40 MG tablet TAKE 1 TABLET (40 MG TOTAL) BY MOUTH DAILY AT 6 PM Patient not taking: Reported on 06/10/2020 12/10/18 01/09/19  Billie Ruddy, MD  blood glucose meter kit and supplies Dispense based on patient and insurance preference. Use up to  four times daily as directed. (FOR ICD-10 E10.9, E11.9). Patient not taking: Reported on 06/10/2020 10/23/17   Shelly Coss, MD  carvedilol (COREG) 6.25 MG tablet TAKE 1 TABLET TWICE A DAY WITH A MEAL Patient not taking: Reported on 06/10/2020 09/21/18   Billie Ruddy, MD  glipiZIDE (GLUCOTROL XL) 5 MG 24 hr tablet Take 5 mg by mouth daily with breakfast. Patient not taking: Reported on 06/10/2020 10/23/17   [provider]  glucose blood test strip Use as instructed Patient not taking: Reported on 06/10/2020 11/04/17   Billie Ruddy, MD  losartan (COZAAR) 50 MG tablet Take 1 tablet (50 mg total) by mouth daily. Patient not taking: Reported on 06/10/2020 03/09/18   Billie Ruddy, MD  metFORMIN (GLUCOPHAGE) 1000 MG tablet TAKE 1 TABLET BY MOUTH EVERY DAY WITH BREAKFAST Patient not taking: TAKE 1 TABLET BY MOUTH EVERY DAY WITH BREAKFAST 09/15/18   Billie Ruddy, MD  polyethylene glycol (MIRALAX / GLYCOLAX) packet Take 17 g by mouth daily as needed for moderate constipation. Patient not taking: Reported on 06/10/2020 10/23/17   Shelly Coss, MD    Physical Exam: Vitals:   06/10/20 0829 06/10/20 0830 06/10/20 0900 06/10/20 0930  BP:  (!) 199/102 (!) 199/95 (!) 141/112  Pulse:  89 88 88  Resp:  (!) 25 (!) 24 (!) 25  Temp: 97.7 F (36.5 C)     TempSrc: Oral     SpO2:  94% 96% 95%  Weight:      Height:         . General:  Appears calm and comfortable and is NAD; wearing a wig . Eyes:  PERRL, EOMI, normal lids, iris . ENT:  grossly normal hearing, lips & tongue, mmm . Neck:  no LAD, masses or thyromegaly . Cardiovascular:  RRR, no m/r/g.  . Respiratory:   CTA bilaterally with no wheezes/rales/rhonchi.  Normal to mildly increased respiratory effort. . Abdomen:  soft, NT, ND, NABS . Back:   normal alignment, no CVAT . Skin:  no rash or induration seen on limited exam . Musculoskeletal:  grossly normal tone BUE/BLE, good ROM, no bony abnormality . Lower extremity:  Marked  taut LE edema extending all the way to her groin.  Limited foot exam with no ulcerations.   Marland Kitchen Psychiatric:  grossly normal mood and affect, speech fluent and appropriate, AOx3 . Neurologic:  CN 2-12 grossly intact, moves all extremities in coordinated fashion    Radiological Exams on Admission: DG Chest 2 View  Result Date: 06/09/2020 CLINICAL DATA:  Shortness of breath and bilateral lower extremity edema times 2-3 months.  EXAM: CHEST - 2 VIEW COMPARISON:  October 20, 2017 FINDINGS: Multiple sternal wires and vascular clips are seen. Mild prominence of the pulmonary vasculature is noted. Mild left basilar atelectasis is present. Small bilateral pleural effusions are seen. No pneumothorax is identified. The cardiac silhouette is mildly enlarged. The visualized skeletal structures are unremarkable. IMPRESSION: 1. Mild left basilar atelectasis with additional findings suggestive of mild pulmonary vascular congestion. 2. Small bilateral pleural effusions. Electronically Signed   By: Virgina Norfolk M.D.   On: 06/09/2020 19:43    EKG: Independently reviewed.  Sinus tachycardia with rate 103; nonspecific but significant ST changes with no evidence of acute ischemia, not significantly worse than prior   Labs on Admission: I have personally reviewed the available labs and imaging studies at the time of the admission.  Pertinent labs:   Glucose 151 BUN 39/Creatinine 2.54/GFR 19 Albumin 3.4 HS troponin 31 BNP 3758.8 WBC 6.2 Hgb 10.7   Assessment/Plan Principal Problem:   Anasarca Active Problems:   Essential hypertension   Type 2 diabetes mellitus with hyperlipidemia (HCC)   CAD (coronary artery disease)   AKI (acute kidney injury) ([place])   Dyslipidemia   Anasarca -Patient presenting with worsening edema/anasarca extending all the way up to her groin; also with DOE -This may be indicative of new onset CHF or may be renal anasarca in the setting of progressive renal dysfunction due to  medication non-compliance. -CXR consistent with mild pulmonary edema -Markedly elevated BNP without known baseline -With elevated BNP and abnl CXR, acute decompensated CHF seems probable as diagnosis -Will admit, as per the Emergency HF Mortality Risk Grade.  The patient has: AKI with >50% decrease in GFR/AKI on CKD with >25% decrease in GFR. -Will request echocardiogram - likely diastolic CHF -Will start ASA -No ACE given renal dysfunction (see below) -Will resume Coreg -CHF order set utilized; may need CHF team consult but will hold until Echo results are available -Was given Lasix 40 mg x 1 in ER and will repeat with 40 mg IV BID -prn [place] O2 for now, currently not needing -Mildly elevated HS troponin is likely related to demand ischemia; doubt ACS based on symptoms but will trend  AKI -Previously normal renal function, but this was last checked in 10/2017 -Current renal dysfunction could be progressive CKD associated with years of medication non-compliance despite known HTN/DM; and/or could be AKI associated with diminished renal perfusion in the setting of CHF -Will order renal US -No IVF given concern for decompensated CHF -Will follow renal function with control of RF and diuresis -If not improving, consider nephrology consultation  HTN -Previously on Norvasc, Cozaar, Coreg -Patient with suboptimal control while in the ER -Will resume Coreg 6.25 mg BID -Will also add prn hydralazine  HLD -Resume Lipitor 40 mg daily -Lipids were checked in 10/2017 and TC was 238 with TG 630 and HDL <10; will recheck  DM -Glucose 151 -Diet controlled -Will check A1c -Previously on Glucotrol, Glucophage -Will cover with moderate-scale SSI for now  CAD -s/p CABG -Resume ASA daily    Note: This patient has been tested and is pending for the novel coronavirus COVID-19; she is fully vaccinated.   DVT prophylaxis: Lovenox  Code Status:  Full - confirmed with patient Family Communication:  None present Disposition Plan:  The patient is from: home  Anticipated d/c is to: home, possibly with [facility] services  Anticipated d/c date will depend on clinical response to treatment, likely 2-4 days  Patient is currently: acutely ill  Consults called: Cardiology; [facility] team; PT Admission status: Admit - It is my clinical opinion that admission to INPATIENT is reasonable and necessary because this patient will require at least 2 midnights in the hospital to treat this condition based on the medical complexity of the problems presented.  Given the aforementioned information, the predictability of an adverse outcome is felt to be significant.    Karmen Bongo MD [facility]   How to contact the [facility] Attending or Consulting provider [place] or covering provider during after hours [place], for this patient?  1. Check the care team in [facility] and look for a) attending/consulting [facility] provider listed and b) the [facility] team listed 2. Log into [email or web address] and use [place]'s universal password to access. If you do not have the password, please contact the hospital operator. 3. Locate the [facility] provider you are looking for under [facility] and page to a number that you can be directly reached. 4. If you still have difficulty reaching the provider, please page the [facility] (Director on Call) for the Hospitalists listed on amion for assistance.   06/10/2020, 11:36 AM

## 2020-06-11 ENCOUNTER — Telehealth: Payer: Self-pay | Admitting: Family Medicine

## 2020-06-11 LAB — BASIC METABOLIC PANEL
Anion gap: 9 (ref 5–15)
BUN: 43 mg/dL — ABNORMAL HIGH (ref 8–23)
CO2: 28 mmol/L (ref 22–32)
Calcium: 8.6 mg/dL — ABNORMAL LOW (ref 8.9–10.3)
Chloride: 105 mmol/L (ref 98–111)
Creatinine, Ser: 2.7 mg/dL — ABNORMAL HIGH (ref 0.44–1.00)
GFR calc Af Amer: 21 mL/min — ABNORMAL LOW (ref >60)
GFR calc non Af Amer: 18 mL/min — ABNORMAL LOW (ref >60)
Glucose, Bld: 172 mg/dL — ABNORMAL HIGH (ref 70–99)
Potassium: 5 mmol/L (ref 3.5–5.1)
Sodium: 142 mmol/L (ref 135–145)

## 2020-06-11 LAB — URINALYSIS, ROUTINE W REFLEX MICROSCOPIC
Bacteria, UA: NONE SEEN
Bilirubin Urine: NEGATIVE
Glucose, UA: NEGATIVE mg/dL
Ketones, ur: NEGATIVE mg/dL
Leukocytes,Ua: NEGATIVE
Nitrite: NEGATIVE
Protein, ur: 100 mg/dL — AB
Specific Gravity, Urine: 1.008 (ref 1.005–1.030)
pH: 5 (ref 5.0–8.0)

## 2020-06-11 LAB — PROTEIN / CREATININE RATIO, URINE
Creatinine, Urine: 31.61 mg/dL
Protein Creatinine Ratio: 1.9 mg/mg{Cre} — ABNORMAL HIGH (ref 0.00–0.15)
Total Protein, Urine: 60 mg/dL

## 2020-06-11 LAB — GLUCOSE, CAPILLARY
Glucose-Capillary: 203 mg/dL — ABNORMAL HIGH (ref 70–99)
Glucose-Capillary: 87 mg/dL (ref 70–99)
Glucose-Capillary: 100 mg/dL — ABNORMAL HIGH (ref 70–99)
Glucose-Capillary: 112 mg/dL — ABNORMAL HIGH (ref 70–99)

## 2020-06-11 LAB — CBC WITH DIFFERENTIAL/PLATELET
Abs Immature Granulocytes: 0.02 10*3/uL (ref 0.00–0.07)
Basophils Absolute: 0 10*3/uL (ref 0.0–0.1)
Basophils Relative: 0 %
Eosinophils Absolute: 0.1 10*3/uL (ref 0.0–0.5)
Eosinophils Relative: 1 %
HCT: 30.9 % — ABNORMAL LOW (ref 36.0–46.0)
Hemoglobin: 9.6 g/dL — ABNORMAL LOW (ref 12.0–15.0)
Immature Granulocytes: 0 %
Lymphocytes Relative: 14 %
Lymphs Abs: 0.7 10*3/uL (ref 0.7–4.0)
MCH: 29 pg (ref 26.0–34.0)
MCHC: 31.1 g/dL (ref 30.0–36.0)
MCV: 93.4 fL (ref 80.0–100.0)
Monocytes Absolute: 0.5 10*3/uL (ref 0.1–1.0)
Monocytes Relative: 9 %
Neutro Abs: 3.7 10*3/uL (ref 1.7–7.7)
Neutrophils Relative %: 76 %
Platelets: 165 10*3/uL (ref 150–400)
RBC: 3.31 MIL/uL — ABNORMAL LOW (ref 3.87–5.11)
RDW: 13.2 % (ref 11.5–15.5)
WBC: 4.9 10*3/uL (ref 4.0–10.5)
nRBC: 0 % (ref 0.0–0.2)

## 2020-06-11 LAB — SODIUM, URINE, RANDOM: Sodium, Ur: 127 mmol/L

## 2020-06-11 LAB — IRON AND TIBC
Iron: 50 ug/dL (ref 28–170)
Saturation Ratios: 20 % (ref 10.4–31.8)
TIBC: 255 ug/dL (ref 250–450)
UIBC: 205 ug/dL

## 2020-06-11 MED ORDER — FUROSEMIDE 10 MG/ML IJ SOLN
80.0000 mg | Freq: Two times a day (BID) | INTRAMUSCULAR | Status: DC
  Administered 2020-06-11 – 2020-06-15 (×8): 80 mg via INTRAVENOUS
  Filled 2020-06-11 (×8): qty 8

## 2020-06-11 NOTE — Progress Notes (Signed)
PROGRESS NOTE  KARAN INCLAN KNL:[id number] DOB: 06-28-1956 DOA: 06/09/2020 PCP: Billie Ruddy, MD  Brief History   Beverly Martin is a 64 y.o. female with medical history significant of HTN; HLD: DM; and CAD s/p CABG presenting with LE edema.  She came because her husband made her come - "I'm bad about not going to doctors."  She denies feeling bad but has had LE edema.  She has been working Health and safety inspector, standing all day).  Edema has been progressive for 2-3 months but worsened about 1 month ago.  Some SOB with exertion.  No cough.  No orthopnea.  No PND.  No chest pain.  She has been out of her medications for a year or two - she has not been taking any medications at all.    ED Course:  Likely new-onset/undiagnosed CHF.  Lost job a couple of months ago and couldn't afford to go to the doctor, doctor wouldn't refill meds.  Progressive edema, SOB.  2-3+ LE edema, edema on CXR.  Has AKI (creatinine 2.54).  Given Lasix and Labetolol.    The patient has been admitted to a telemetry bed. Diuresis has been continued. Nephrology has been consulted due to elevated creatinine.  Consultants  . Nephrology . Cardiology  Procedures  . None  Antibiotics   Anti-infectives (From admission, onward)   None    .  Subjective  The patient is sitting up at bedside. She states that her breathing is better, but she continues to have lower extremity edema.  Objective   Vitals:  Vitals:   06/11/20 1611 06/11/20 1700  BP: (!) 157/83 (!) 157/83  Pulse: 81 87  Resp: 17   Temp: 98.1 F (36.7 C)   SpO2: 99%    Exam:  Constitutional:  . The patient is awake, alert, and oriented x 3. No acute distress. Respiratory:  . No increased work of breathing. . No wheezes, rales, or rhonchi . No tactile fremitus Cardiovascular:  . Regular rate and rhythm . No murmurs, ectopy, or gallups. . No lateral PMI. No thrills. Marland Kitchen 4+ pitting edema of lower extremities bilaterally. Abdomen:  . Abdomen is  soft, non-tender, non-distended . No hernias, masses, or organomegaly . Normoactive bowel sounds.  Musculoskeletal:  . No cyanosis, clubbing . 4+ pitting edema of lower extremities bilaterally. Skin:  . No rashes, lesions, ulcers . palpation of skin: no induration or nodules Neurologic:  . CN 2-12 intact . Sensation all 4 extremities intact Psychiatric:  . Mental status o Mood, affect appropriate o Orientation to person, place, time  . judgment and insight appear intact  I have personally reviewed the following:   Today's Data  . Vitals, CBC, BMP, Iron studies, lipid studies  Cardiology Data  . EF 45-50%. Global hypokinesis.Grade II diastolic dysfunction  Scheduled Meds: . aspirin EC  81 mg Oral Daily  . atorvastatin  40 mg Oral Daily  . carvedilol  6.25 mg Oral BID WC  . enoxaparin (LOVENOX) injection  30 mg Subcutaneous Q24H  . furosemide  80 mg Intravenous BID  . insulin aspart  0-15 Units Subcutaneous TID WC  . insulin aspart  0-5 Units Subcutaneous QHS  . sodium chloride flush  3 mL Intravenous Q12H   Continuous Infusions: . sodium chloride      Principal Problem:   Anasarca Active Problems:   Essential hypertension   Type 2 diabetes mellitus with hyperlipidemia (HCC)   CAD (coronary artery disease)   AKI (acute kidney injury) ([place])  Dyslipidemia   LOS: 1 day   A & P  Anasarca Patient presenting with worsening edema/anasarca extending all the way up to her groin; also with DOE. CSR was consistent with pulmonary edema. BNP is elevated. Echocardiogram had demonstrated EF 45-50%. Global hypokinesis.Grade II diastolic dysfunction. Cardiology has been consulted and CHF order set utilized.  Elevated troponins: Mildly elevated HS troponin is likely related to demand ischemia; doubt ACS based on symptoms but will trend. Cardiology consulted.  AKI on CKD: Nephrology consulted. Avoid nephrotoxic medications and hypotension. Renal ultrasound demonstrates mild  increase in bilateral renal echogenicity consistent with medical renal disease. Renal function when last checked in 10/2017 was normal.   HTN: Poor control. The patient has been continued on Coreg 6.25 with as needed hydralazine.  HLD: Resume Lipitor 40 mg daily. Lipid panel reveals an LDL of 157. I do not believe that the patient was taking this at home either.   DM DG:[id number] A1c of 8.2. "Diet controlled". Glucoses will be followed with FSBS and SSI. She was supposed to be taking glucophage and glucotrol at home, but she was not.   CAD: s/p CABG. Resume ASA daily. Troponins elevated, although this is felt to be due to demand ischemia. She has been started on Coreg. Cardiology is consulted and the patient is being monitored on telemetry.  I have seen and examined this patient myself. I have spent 35 minutes in her evaluation and care.  DVT prophylaxis: Lovenox  Code Status:  Full  Family Communication: None present Disposition Plan:The patient is from: home             Anticipated d/c is to: home, possibly with [facility] services             Anticipated d/c date will depend on clinical response to treatment, likely 2-4 days             Patient is currently: acutely ill  Joy Haegele, DO [facility] Direct contact: see [email or web address]  7PM-7AM contact night coverage as above 06/11/2020, 5:27 PM  LOS: 1 day

## 2020-06-11 NOTE — Consult Note (Signed)
[facility] Renal Consultation Note  Requesting MD: Dr. Benny Lennert Indication for Consultation: Acute kidney injury, maintenance euvolemia, assessment treatment of electrolyte abnormalities, assessment and treatment of acid-base abnormalities.  HPI Beverly Martin is a 64 y.o. female.  Significant history of hypertension hyperlipidemia diabetes cardiology status post CABG.  She presented with lower extremity swelling that has been progressive over the last 2 to 3 months but worse over the past month.  She also admits to some exertional dyspnea.  A 2D echo showed an ejection fraction of 45-50% with mildly decreased function global hypokinesis right-sided heart function was normal valvular structures appear to be normal.  He was admitted 06/09/2020.  Her initial blood pressures were severely elevated at 675 mmHg systolic.  Her creatinine admission was 2.54 her creatinine this morning is 2.7 mg/dL  She continues on aspirin 81 mg daily, Lipitor 40 mg daily Coreg 6.25 mg twice daily Lovenox, hydralazine 5 mg as needed last given 06/10/2020 1756, insulin sliding scale, labetalol IV 10 mg last given 06/10/2020 0931.  Home medication appeared to be amlodipine 10 mg daily and atorvastatin 40 mg daily  Sodium 142 potassium 5.0 chloride 105 CO2 28 BUN 43 creatinine 2.7 glucose 172 albumin 3.4 AST 12 ALT 16 hemoglobin 9.6    Creatinine, Ser  Date/Time Value Ref Range Status  06/11/2020 01:27 AM 2.70 (H) 0.44 - 1.00 mg/dL Final  06/09/2020 07:47 PM 2.54 (H) 0.44 - 1.00 mg/dL Final  11/04/2017 09:23 AM 1.09 0.40 - 1.20 mg/dL Final  10/23/2017 04:16 AM 1.11 (H) 0.44 - 1.00 mg/dL Final  10/22/2017 06:42 AM 1.21 (H) 0.44 - 1.00 mg/dL Final  10/21/2017 07:39 AM 1.44 (H) 0.44 - 1.00 mg/dL Final  10/20/2017 01:01 PM 1.97 (H) 0.44 - 1.00 mg/dL Final  10/20/2017 11:35 AM 1.90 (H) 0.44 - 1.00 mg/dL Final     PMHx:   Past Medical History:  Diagnosis Date  . CAD (coronary artery disease)   .  Diabetes mellitus without complication ([facility])   . Essential hypertension   . Hyperlipidemia     Past Surgical History:  Procedure Laterality Date  . CARDIAC SURGERY     CABG    Family Hx:  Family History  Problem Relation Age of Onset  . Diabetes Mellitus II Mother   . Esophageal cancer Father     Social History:  reports that she has never smoked. She has never used smokeless tobacco. She reports that she does not drink alcohol and does not use drugs.  Allergies: No Known Allergies  Medications: Prior to Admission medications   Medication Sig Start Date End Date Taking? Authorizing Provider  amLODipine (NORVASC) 10 MG tablet Take 1 tablet (10 mg total) by mouth daily. Patient not taking: Reported on 06/10/2020 12/07/17 01/06/18  Billie Ruddy, MD  atorvastatin (LIPITOR) 40 MG tablet TAKE 1 TABLET (40 MG TOTAL) BY MOUTH DAILY AT 6 PM Patient not taking: Reported on 06/11/19 12/10/18 01/09/19  Billie Ruddy, MD      Labs:  Results for orders placed or performed during the hospital encounter of 06/09/20 (from the past 48 hour(s))  CBC     Status: Abnormal   Collection Time: 06/09/20  7:47 PM  Result Value Ref Range   WBC 6.2 4.0 - 10.5 K/uL   RBC 3.73 (L) 3.87 - 5.11 MIL/uL   Hemoglobin 10.7 (L) 12.0 - 15.0 g/dL   HCT 35.5 (L) 36 - 46 %   MCV 95.2 80.0 - 100.0 fL  MCH 28.7 26.0 - 34.0 pg   MCHC 30.1 30.0 - 36.0 g/dL   RDW 13.1 11.5 - 15.5 %   Platelets 190 150 - 400 K/uL   nRBC 0.0 0.0 - 0.2 %    Comment: Performed at [facility], [place] [address], [place], [place] [address]  Comprehensive metabolic panel     Status: Abnormal   Collection Time: 06/09/20  7:47 PM  Result Value Ref Range   Sodium 142 135 - 145 mmol/L   Potassium 5.1 3.5 - 5.1 mmol/L   Chloride 106 98 - 111 mmol/L   CO2 27 22 - 32 mmol/L   Glucose, Bld 151 (H) 70 - 99 mg/dL    Comment: Glucose reference range applies only to samples taken after fasting for at least 8 hours.   BUN 39 (H) 8 - 23  mg/dL   Creatinine, Ser 2.54 (H) 0.44 - 1.00 mg/dL   Calcium 8.9 8.9 - 10.3 mg/dL   Total Protein 5.8 (L) 6.5 - 8.1 g/dL   Albumin 3.4 (L) 3.5 - 5.0 g/dL   AST 12 (L) 15 - 41 U/L   ALT 16 0 - 44 U/L   Alkaline Phosphatase 63 38 - 126 U/L   Total Bilirubin 0.6 0.3 - 1.2 mg/dL   GFR calc non Af Amer 19 (L) >60 mL/min   GFR calc Af Amer 22 (L) >60 mL/min   Anion gap 9 5 - 15    Comment: Performed at [facility] [address], [place], [place] [address]  Troponin I (High Sensitivity)     Status: Abnormal   Collection Time: 06/10/20  9:18 AM  Result Value Ref Range   Troponin I (High Sensitivity) 31 (H) <18 ng/L    Comment: (NOTE) Elevated high sensitivity troponin I (hsTnI) values and significant  changes across serial measurements may suggest ACS but many other  chronic and acute conditions are known to elevate hsTnI results.  Refer to the "Links" section for chest pain algorithms and additional  guidance. Performed at [facility], [place] [address] [place], [place] [address]   Brain natriuretic peptide     Status: Abnormal   Collection Time: 06/10/20 10:39 AM  Result Value Ref Range   B Natriuretic Peptide 3,758.8 (H) 0.0 - 100.0 pg/mL    Comment: Performed at [facility] [address], [place], [place] [address]  SARS Coronavirus 2 by RT PCR (hospital order, performed in [facility] hospital lab) Nasopharyngeal Nasopharyngeal Swab     Status: None   Collection Time: 06/10/20 11:34 AM   Specimen: Nasopharyngeal Swab  Result Value Ref Range   SARS Coronavirus 2 NEGATIVE NEGATIVE    Comment: (NOTE) SARS-CoV-2 target nucleic acids are NOT DETECTED.  The SARS-CoV-2 RNA is generally detectable in upper and lower respiratory specimens during the acute phase of infection. The lowest concentration of SARS-CoV-2 viral copies this assay can detect is 250 copies / mL. A negative result does not preclude SARS-CoV-2 infection and should not be used as the sole basis  for treatment or other patient management decisions.  A negative result may occur with improper specimen collection / handling, submission of specimen other than nasopharyngeal swab, presence of viral mutation(s) within the areas targeted by this assay, and inadequate number of viral copies (<250 copies / mL). A negative result must be combined with clinical observations, patient history, and epidemiological information.  Fact Sheet for Patients:   StrictlyIdeas.no  Fact Sheet  for Healthcare Providers: BankingDealers.co.za  This test is not yet approved or  cleared by the [place] and has been authorized for detection and/or diagnosis of SARS-CoV-2 by FDA under an Emergency Use Authorization (EUA).  This EUA will remain in effect (meaning this test can be used) for the duration of the COVID-19 declaration under Section 564(b)(1) of the Act, 21 U.S.C. section 360bbb-3(b)(1), unless the authorization is terminated or revoked sooner.  Performed at [facility], [place] [address] [place], [place] [address]   POC CBG, ED     Status: Abnormal   Collection Time: 06/10/20 12:00 PM  Result Value Ref Range   Glucose-Capillary 105 (H) 70 - 99 mg/dL    Comment: Glucose reference range applies only to samples taken after fasting for at least 8 hours.   Comment 1 Notify RN    Comment 2 Document in Chart   Urine rapid drug screen (hosp performed)     Status: None   Collection Time: 06/10/20  3:49 PM  Result Value Ref Range   Opiates NONE DETECTED NONE DETECTED   Cocaine NONE DETECTED NONE DETECTED   Benzodiazepines NONE DETECTED NONE DETECTED   Amphetamines NONE DETECTED NONE DETECTED   Tetrahydrocannabinol NONE DETECTED NONE DETECTED   Barbiturates NONE DETECTED NONE DETECTED    Comment: (NOTE) DRUG SCREEN FOR MEDICAL PURPOSES ONLY.  IF CONFIRMATION IS NEEDED FOR ANY PURPOSE, NOTIFY LAB WITHIN 5 DAYS.  LOWEST DETECTABLE  LIMITS FOR URINE DRUG SCREEN Drug Class                     Cutoff (ng/mL) Amphetamine and metabolites    1000 Barbiturate and metabolites    200 Benzodiazepine                 341 Tricyclics and metabolites     300 Opiates and metabolites        300 Cocaine and metabolites        300 THC                            50 Performed at [facility], [place] [address] [place], [place] [address]   Urinalysis, Routine w reflex microscopic Urine, Random     Status: Abnormal   Collection Time: 06/10/20  3:49 PM  Result Value Ref Range   Color, Urine YELLOW YELLOW   APPearance HAZY (A) CLEAR   Specific Gravity, Urine 1.008 1.005 - 1.030   pH 5.0 5.0 - 8.0   Glucose, UA NEGATIVE NEGATIVE mg/dL   Hgb urine dipstick SMALL (A) NEGATIVE   Bilirubin Urine NEGATIVE NEGATIVE   Ketones, ur NEGATIVE NEGATIVE mg/dL   Protein, ur 100 (A) NEGATIVE mg/dL   Nitrite NEGATIVE NEGATIVE   Leukocytes,Ua NEGATIVE NEGATIVE   RBC / HPF 0-5 0 - 5 RBC/hpf   WBC, UA 11-20 0 - 5 WBC/hpf   Bacteria, UA RARE (A) NONE SEEN   Squamous Epithelial / LPF 0-5 0 - 5   Mucus PRESENT    Hyaline Casts, UA PRESENT     Comment: Performed at [facility], [place] [address], [place], [place] [address]  Troponin I (High Sensitivity)     Status: Abnormal   Collection Time: 06/10/20  3:56 PM  Result Value Ref Range   Troponin I (High Sensitivity) 32 (H) <18 ng/L    Comment: (NOTE) Elevated high sensitivity troponin I (hsTnI) values and significant  changes across serial measurements may suggest ACS but many other  chronic and acute conditions are known to elevate hsTnI results.  Refer to the "Links" section for chest pain algorithms and additional  guidance. Performed at [facility], [place] [address], [place], [place] [address]   HIV Antibody (routine testing w rflx)     Status: None   Collection Time: 06/10/20  3:56 PM  Result Value Ref Range   HIV Screen 4th Generation wRfx Non Reactive Non Reactive     Comment: Performed at [facility], [place] [address], [place], [place] [address]  Hemoglobin A1c     Status: Abnormal   Collection Time: 06/10/20  3:56 PM  Result Value Ref Range   Hgb A1c MFr Bld 8.2 (H) 4.8 - 5.6 %    Comment: (NOTE) Pre diabetes:          5.7%-6.4%  Diabetes:              >6.4%  Glycemic control for   <7.0% adults with diabetes    Mean Plasma Glucose 188.64 mg/dL    Comment: Performed at [facility] [address] [place], [place] [address]  Lipid panel     Status: Abnormal   Collection Time: 06/10/20  3:56 PM  Result Value Ref Range   Cholesterol 245 (H) 0 - 200 mg/dL   Triglycerides 93 <150 mg/dL   HDL 69 >40 mg/dL   Total CHOL/HDL Ratio 3.6 RATIO   VLDL 19 0 - 40 mg/dL   LDL Cholesterol 157 (H) 0 - 99 mg/dL    Comment:        Total Cholesterol/HDL:CHD Risk Coronary Heart Disease Risk Table                     Men   Women  1/2 Average Risk   3.4   3.3  Average Risk       5.0   4.4  2 X Average Risk   9.6   7.1  3 X Average Risk  23.4   11.0        Use the calculated Patient Ratio above and the CHD Risk Table to determine the patient's CHD Risk.        ATP III CLASSIFICATION (LDL):  <100     mg/dL   Optimal  100-129  mg/dL   Near or Above                    Optimal  130-159  mg/dL   Borderline  160-189  mg/dL   High  >190     mg/dL   Very High Performed at [facility] [address], [place], [place] [address]   TSH     Status: None   Collection Time: 06/10/20  3:56 PM  Result Value Ref Range   TSH 2.053 0.350 - 4.500 uIU/mL    Comment: Performed by a 3rd Generation assay with a functional sensitivity of <=0.01 uIU/mL. Performed at [facility], [place] [address], [place], [place] [address]   POC CBG, ED     Status: Abnormal   Collection Time: 06/10/20  5:47 PM  Result Value Ref Range   Glucose-Capillary 157 (H) 70 - 99 mg/dL    Comment: Glucose reference range applies only to samples taken after fasting for at  least 8 hours.   Comment 1 Notify RN    Comment 2 Document in Chart  CBG monitoring, ED     Status: Abnormal   Collection Time: 06/10/20  5:54 PM  Result Value Ref Range   Glucose-Capillary 146 (H) 70 - 99 mg/dL    Comment: Glucose reference range applies only to samples taken after fasting for at least 8 hours.  Glucose, capillary     Status: Abnormal   Collection Time: 06/10/20  8:19 PM  Result Value Ref Range   Glucose-Capillary 170 (H) 70 - 99 mg/dL    Comment: Glucose reference range applies only to samples taken after fasting for at least 8 hours.  CBC WITH DIFFERENTIAL     Status: Abnormal   Collection Time: 06/11/20  1:27 AM  Result Value Ref Range   WBC 4.9 4.0 - 10.5 K/uL   RBC 3.31 (L) 3.87 - 5.11 MIL/uL   Hemoglobin 9.6 (L) 12.0 - 15.0 g/dL   HCT 30.9 (L) 36 - 46 %   MCV 93.4 80.0 - 100.0 fL   MCH 29.0 26.0 - 34.0 pg   MCHC 31.1 30.0 - 36.0 g/dL   RDW 13.2 11.5 - 15.5 %   Platelets 165 150 - 400 K/uL   nRBC 0.0 0.0 - 0.2 %   Neutrophils Relative % 76 %   Neutro Abs 3.7 1.7 - 7.7 K/uL   Lymphocytes Relative 14 %   Lymphs Abs 0.7 0.7 - 4.0 K/uL   Monocytes Relative 9 %   Monocytes Absolute 0.5 0 - 1 K/uL   Eosinophils Relative 1 %   Eosinophils Absolute 0.1 0 - 0 K/uL   Basophils Relative 0 %   Basophils Absolute 0.0 0 - 0 K/uL   Immature Granulocytes 0 %   Abs Immature Granulocytes 0.02 0.00 - 0.07 K/uL    Comment: Performed at [facility], [address], [place], [place] [address]  Basic metabolic panel     Status: Abnormal   Collection Time: 06/11/20  1:27 AM  Result Value Ref Range   Sodium 142 135 - 145 mmol/L   Potassium 5.0 3.5 - 5.1 mmol/L   Chloride 105 98 - 111 mmol/L   CO2 28 22 - 32 mmol/L   Glucose, Bld 172 (H) 70 - 99 mg/dL    Comment: Glucose reference range applies only to samples taken after fasting for at least 8 hours.   BUN 43 (H) 8 - 23 mg/dL   Creatinine, Ser 2.70 (H) 0.44 - 1.00 mg/dL   Calcium 8.6 (L) 8.9 - 10.3 mg/dL   GFR  calc non Af Amer 18 (L) >60 mL/min   GFR calc Af Amer 21 (L) >60 mL/min   Anion gap 9 5 - 15    Comment: Performed at [facility] [address] [place], [place] [address]  Glucose, capillary     Status: Abnormal   Collection Time: 06/11/20  8:01 AM  Result Value Ref Range   Glucose-Capillary 203 (H) 70 - 99 mg/dL    Comment: Glucose reference range applies only to samples taken after fasting for at least 8 hours.     ROS:  General denies fatigue fever sweats or chills Eyes denies blurred vision double vision loss of vision Ears nose mouth throat denies hearing loss epistaxis or sore throat Cardiovascular admits to orthopnea and exertional dyspnea.  Ejection fraction diminished EF.  Global hypokinesis Respiratory system denies cough wheeze and obsess Abdominal system denies abdominal pain nausea vomiting change in bowel habits blood in stools Urogenital no urgency frequency dysuria Neurologic no  stroke seizures numbness tingling weakness no upper lower extremity weakness Dermatologic no skin rash or itching  Physical Exam: Vitals:   06/11/20 0757 06/11/20 0845  BP: (!) 149/65 (!) 145/70  Pulse: 94 93  Resp: 17 18  Temp: 98 F (36.7 C) 99.4 F (37.4 C)  SpO2: 98% 96%     General: Frail lady sitting out of bed comfortable HEENT: No icterus oropharynx was clear mucous membranes moist Eyes: Extraocular movements are intact no pallor or icterus noted Neck: Supple mildly elevated JVP Heart: Regular rate and rhythm no murmurs rubs or gallops Lungs: Clear to auscultation with some diminished bases Abdomen: Soft nontender  bowel sounds present Extremities: 2+ pitting edema mid shins Skin: No skin lesions ulcers calluses Neuro: Grossly intact  Assessment/Plan: 1.Renal-acute kidney injury in the setting of worsening congestive heart failure and hypertensive crisis.  Will check renal ultrasound and urine studies.  I believe this may represent some acute tubular necrosis  secondary to her severe hypertension.  Blood pressure appears under better control at this point.  We will continue to follow and adjust medications as needed. 2. Hypertension/volume  -mild volume overload I would continue IV diuretics would recommend Lasix 80 mg every 12 hours. 3.  Anemia we will check iron studies 4.  Bones will check calcium phosphorus PTH 5.  New diagnosis of congestive heart failure would recommend cardiology involvement.  Would also check SPEP and spot urine protein electrophoresis for evaluation of monoclonal protein.  Will check TSH.    Sherril Croon 06/11/2020, 10:04 AM

## 2020-06-11 NOTE — Plan of Care (Signed)
  Problem: Education: Goal: Knowledge of General Education information will improve Description: Including pain rating scale, medication(s)/side effects and non-pharmacologic comfort measures Outcome: Progressing   Problem: Clinical Measurements: Goal: Ability to maintain clinical measurements within normal limits will improve Outcome: Progressing Goal: Will remain free from infection Outcome: Progressing   

## 2020-06-11 NOTE — Evaluation (Signed)
Physical Therapy Evaluation Patient Details Name: Beverly Martin MRN: [id number] DOB: 1956-04-27 Today's Date: 06/11/2020   History of Present Illness  Patient is a 64 y/o female who presents with LE edema and SOB; found to have anasarca, new diagnosis of CHF and AKI. CXR-mild pulmonary edema. PMH includes HTN, DM, CAD, s/p CABG, bil TKAs.  Clinical Impression  Patient presents with LE edema/anasarca, decreased strength and decreased endurance s/p above. Pt lives at home with spouse, works and is independent for ADLs/ambulation PTA. Today, pt tolerated transfers and ambulation mod I with Sp02 dropping to 87% on RA; recovers quickly with rest break. Encouraged increasing activity while in the hospital re: shorter bouts of activity- walking to bathroom and within room as much as tolerated. Pt does not require skilled therapy services as pt functioning close to baseline and likely will improve when able to rid self of fluid and increase mobility. All education completed. Discharge from therapy.     Follow Up Recommendations No PT follow up;Supervision - Intermittent    Equipment Recommendations  None recommended by PT    Recommendations for Other Services       Precautions / Restrictions Precautions Precautions: Other (comment) Precaution Comments: watch 02 Restrictions Weight Bearing Restrictions: No      Mobility  Bed Mobility               General bed mobility comments: up in chair upon PT arrival.  Transfers Overall transfer level: Modified independent Equipment used: None             General transfer comment: Stood from chair without difficulty; "i can't stand up too fast."  Ambulation/Gait Ambulation/Gait assistance: Supervision;Modified independent (Device/Increase time) Gait Distance (Feet): 400 Feet Assistive device: None Gait Pattern/deviations: Step-through pattern;Decreased stride length Gait velocity: 2.16 ft/sec Gait velocity interpretation: 1.31 -  2.62 ft/sec, indicative of limited community ambulator General Gait Details: Steady gait with 2/4 DOE. Sp02 dropped to 87% on RA, recovers quickly with rest break and cues for breathing. Reports LEs get tired with increased distance.  Stairs            Wheelchair Mobility    Modified Rankin (Stroke Patients Only)       Balance Overall balance assessment: Modified Independent;No apparent balance deficits (not formally assessed)                                           Pertinent Vitals/Pain Pain Assessment: No/denies pain    Home Living Family/patient expects to be discharged to:: Private residence Living Arrangements: Spouse/significant other Available Help at Discharge: Family;Available PRN/intermittently Type of Home: Other(Comment) (townhome) Home Access: Level entry     Home Layout: One level Home Equipment: None      Prior Function Level of Independence: Independent         Comments: Works, standing all day.. Drives. independent ADLs/walking.     Hand Dominance   Dominant Hand: Right    Extremity/Trunk Assessment   Upper Extremity Assessment Upper Extremity Assessment: Defer to OT evaluation    Lower Extremity Assessment Lower Extremity Assessment: RLE deficits/detail;LLE deficits/detail RLE Sensation: decreased light touch LLE Sensation: decreased light touch    Cervical / Trunk Assessment Cervical / Trunk Assessment: Normal  Communication   Communication: No difficulties  Cognition Arousal/Alertness: Awake/alert Behavior During Therapy: WFL for tasks assessed/performed Overall Cognitive Status: Within Functional Limits for tasks assessed  General Comments General comments (skin integrity, edema, etc.): Sp02 dropped to 87% on RA with activity.    Exercises     Assessment/Plan    PT Assessment Patent does not need any further PT services  PT Problem List          PT Treatment Interventions      PT Goals (Current goals can be found in the Care Plan section)  Acute Rehab PT Goals Patient Stated Goal: to get better and go home PT Goal Formulation: All assessment and education complete, DC therapy    Frequency     Barriers to discharge        Co-evaluation               AM-PAC PT "6 Clicks" Mobility  Outcome Measure Help needed turning from your back to your side while in a flat bed without using bedrails?: None Help needed moving from lying on your back to sitting on the side of a flat bed without using bedrails?: None Help needed moving to and from a bed to a chair (including a wheelchair)?: None Help needed standing up from a chair using your arms (e.g., wheelchair or bedside chair)?: None Help needed to walk in hospital room?: None Help needed climbing 3-5 steps with a railing? : A Little 6 Click Score: 23    End of Session   Activity Tolerance: Patient tolerated treatment well;Treatment limited secondary to medical complications (Comment) (drop in SP02) Patient left: in chair;with call bell/phone within reach Nurse Communication: Mobility status PT Visit Diagnosis: Muscle weakness (generalized) (M62.81)    Time: 9774-1423 PT Time Calculation (min) (ACUTE ONLY): 13 min   Charges:   PT Evaluation $PT Eval Moderate Complexity: 1 Mod          Beverly Martin, PT, DPT Acute Rehabilitation Services Pager [phone] Office [phone]      [place] 06/11/2020, 12:03 PM

## 2020-06-11 NOTE — Telephone Encounter (Signed)
Patient called to let Dr. Volanda Napoleon know that she is in the hospital because they thought that she was possibly having congestive heart failure and kidney infection. She just wanted to make sure that Dr. Volanda Napoleon was following up with her and she will call to make a HFU when she is released.

## 2020-06-11 NOTE — Consult Note (Signed)
   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   Met with the patient and her husband at the bedside.  Explained [facility] Care Management services with her insurance and primary care provider in network benefits for post hospital follow up and needs.  Patient was given a 24 hour nurse advise line magnet and contact information.   Patient will receive post hospital discharge call and will be evaluated for complex disease management.      Of note, [facility] Care Management services does not replace or interfere with any services that are arranged by inpatient case management or social work.  For additional questions or referrals please contact:    Natividad Brood, RN BSN [facility] Liaison  [phone] business mobile phone Toll free office [phone]  Fax number: [phone] [email or web address]  [email or web address]

## 2020-06-12 LAB — PROTEIN ELECTROPHORESIS, SERUM
A/G Ratio: 1.1 (ref 0.7–1.7)
Albumin ELP: 3.1 g/dL (ref 2.9–4.4)
Alpha-1-Globulin: 0.3 g/dL (ref 0.0–0.4)
Alpha-2-Globulin: 0.7 g/dL (ref 0.4–1.0)
Beta Globulin: 1 g/dL (ref 0.7–1.3)
Gamma Globulin: 0.8 g/dL (ref 0.4–1.8)
Globulin, Total: 2.7 g/dL (ref 2.2–3.9)
Total Protein ELP: 5.8 g/dL — ABNORMAL LOW (ref 6.0–8.5)

## 2020-06-12 LAB — BASIC METABOLIC PANEL
Anion gap: 8 (ref 5–15)
BUN: 47 mg/dL — ABNORMAL HIGH (ref 8–23)
CO2: 30 mmol/L (ref 22–32)
Calcium: 8.4 mg/dL — ABNORMAL LOW (ref 8.9–10.3)
Chloride: 104 mmol/L (ref 98–111)
Creatinine, Ser: 3.01 mg/dL — ABNORMAL HIGH (ref 0.44–1.00)
GFR calc Af Amer: 18 mL/min — ABNORMAL LOW (ref >60)
GFR calc non Af Amer: 16 mL/min — ABNORMAL LOW (ref >60)
Glucose, Bld: 149 mg/dL — ABNORMAL HIGH (ref 70–99)
Potassium: 4.5 mmol/L (ref 3.5–5.1)
Sodium: 142 mmol/L (ref 135–145)

## 2020-06-12 LAB — URINALYSIS, ROUTINE W REFLEX MICROSCOPIC
Bilirubin Urine: NEGATIVE
Glucose, UA: NEGATIVE mg/dL
Ketones, ur: NEGATIVE mg/dL
Nitrite: NEGATIVE
Protein, ur: 100 mg/dL — AB
Specific Gravity, Urine: 1.009 (ref 1.005–1.030)
pH: 6 (ref 5.0–8.0)

## 2020-06-12 LAB — IMMUNOFIXATION ELECTROPHORESIS
IgA: 407 mg/dL — ABNORMAL HIGH (ref 87–352)
IgG (Immunoglobin G), Serum: 597 mg/dL (ref 586–1602)
IgM (Immunoglobulin M), Srm: 210 mg/dL (ref 26–217)
Total Protein ELP: 6.2 g/dL (ref 6.0–8.5)

## 2020-06-12 LAB — GLUCOSE, CAPILLARY
Glucose-Capillary: 123 mg/dL — ABNORMAL HIGH (ref 70–99)
Glucose-Capillary: 144 mg/dL — ABNORMAL HIGH (ref 70–99)
Glucose-Capillary: 102 mg/dL — ABNORMAL HIGH (ref 70–99)
Glucose-Capillary: 201 mg/dL — ABNORMAL HIGH (ref 70–99)

## 2020-06-12 LAB — PARATHYROID HORMONE, INTACT (NO CA): PTH: 171 pg/mL — ABNORMAL HIGH (ref 15–65)

## 2020-06-12 LAB — SODIUM, URINE, RANDOM: Sodium, Ur: 128 mmol/L

## 2020-06-12 MED ORDER — CALCITRIOL 0.25 MCG PO CAPS
0.2500 ug | ORAL_CAPSULE | Freq: Every day | ORAL | Status: DC
  Administered 2020-06-12 – 2020-06-19 (×8): 0.25 ug via ORAL
  Filled 2020-06-12 (×8): qty 1

## 2020-06-12 MED ORDER — DARBEPOETIN ALFA 25 MCG/0.42ML IJ SOSY
25.0000 ug | PREFILLED_SYRINGE | Freq: Once | INTRAMUSCULAR | Status: AC
  Administered 2020-06-12: 25 ug via SUBCUTANEOUS
  Filled 2020-06-12: qty 0.42

## 2020-06-12 MED ORDER — FERROUS SULFATE 325 (65 FE) MG PO TABS
325.0000 mg | ORAL_TABLET | Freq: Two times a day (BID) | ORAL | Status: DC
  Administered 2020-06-12 – 2020-06-19 (×15): 325 mg via ORAL
  Filled 2020-06-12 (×15): qty 1

## 2020-06-12 NOTE — Progress Notes (Signed)
Beverly Martin   Subjective:   Brief history: 64 year old lady hypertension hyperlipidemia diabetes status post CABG.  She was admitted with volume overload and 2D echo demonstrating 45-50% ejection fraction with decreased global hypokinesis.  Serum creatinine on admission was 2.54 mg/dL increase slightly .  Previous creatinine 1.09 11/04/2017.  Renal ultrasound 06/10/2020 showed mild increase in bilateral renal echogenicity.  Blood pressure 150 477 pulse 79 temperature 99 O2 sats98% room air.  Sodium 142 potassium 4.5 chloride 104 CO2 30 BUN to 47 creatinine 3.01 glucose 149 calcium 8.4 hemoglobin 9.6 PTH 171.  TSH 2.053  Objective:  Vital signs in last 24 hours:  Temp:  [98.1 F (36.7 C)-99.4 F (37.4 C)] 99 F (37.2 C) (08/31 0500) Pulse Rate:  [79-93] 79 (08/31 0500) Resp:  [17-18] 17 (08/31 0500) BP: (137-177)/(65-83) 154/77 (08/31 0500) SpO2:  [95 %-99 %] 98 % (08/31 0500) Weight:  [66.3 kg] 66.3 kg (08/31 0500)  Weight change: -2.223 kg Filed Weights   06/10/20 1811 06/11/20 0543 06/12/20 0500  Weight: 68.5 kg 67.8 kg 66.3 kg    Intake/Output: I/O last 3 completed shifts: In: 244 [P.O.:244] Out: 2725 [Urine:2725]   Intake/Output this shift:  No intake/output data recorded.  CVS- RRR RS- CTA ABD- BS present soft non-distended EXT- no edema   Basic Metabolic Panel: Recent Labs  Lab 06/09/20 1947 06/11/20 0127 06/12/20 0327  NA 142 142 142  K 5.1 5.0 4.5  CL 106 105 104  CO2 27 28 30   GLUCOSE 151* 172* 149*  BUN 39* 43* 47*  CREATININE 2.54* 2.70* 3.01*  CALCIUM 8.9 8.6* 8.4*    Liver Function Tests: Recent Labs  Lab 06/09/20 1947  AST 12*  ALT 16  ALKPHOS 63  BILITOT 0.6  PROT 5.8*  ALBUMIN 3.4*   No results for input(s): LIPASE, AMYLASE in the last 168 hours. No results for input(s): AMMONIA in the last 168 hours.  CBC: Recent Labs  Lab 06/09/20 1947 06/11/20 0127  WBC 6.2 4.9  NEUTROABS  --  3.7  HGB 10.7*  9.6*  HCT 35.5* 30.9*  MCV 95.2 93.4  PLT 190 165    Cardiac Enzymes: No results for input(s): CKTOTAL, CKMB, CKMBINDEX, TROPONINI in the last 168 hours.  BNP: Invalid input(s): POCBNP  CBG: Recent Labs  Lab 06/11/20 0801 06/11/20 1113 06/11/20 1625 06/11/20 2151 06/12/20 0743  GLUCAP 203* 87 100* 112* 123*    Microbiology: Results for orders placed or performed during the hospital encounter of 06/09/20  SARS Coronavirus 2 by RT PCR (hospital order, performed in [facility] hospital lab) Nasopharyngeal Nasopharyngeal Swab     Status: None   Collection Time: 06/10/20 11:34 AM   Specimen: Nasopharyngeal Swab  Result Value Ref Range Status   SARS Coronavirus 2 NEGATIVE NEGATIVE Final    Comment: (Martin) SARS-CoV-2 target nucleic acids are NOT DETECTED.  The SARS-CoV-2 RNA is generally detectable in upper and lower respiratory specimens during the acute phase of infection. The lowest concentration of SARS-CoV-2 viral copies this assay can detect is 250 copies / mL. A negative result does not preclude SARS-CoV-2 infection and should not be used as the sole basis for treatment or other patient management decisions.  A negative result may occur with improper specimen collection / handling, submission of specimen other than nasopharyngeal swab, presence of viral mutation(s) within the areas targeted by this assay, and inadequate number of viral copies (<250 copies / mL). A negative result must be combined with clinical  observations, patient history, and epidemiological information.  Fact Sheet for Patients:   StrictlyIdeas.no  Fact Sheet for Healthcare Providers: BankingDealers.co.za  This test is not yet approved or  cleared by the [place] FDA and has been authorized for detection and/or diagnosis of SARS-CoV-2 by FDA under an Emergency Use Authorization (EUA).  This EUA will remain in effect (meaning this test can be  used) for the duration of the COVID-19 declaration under Section 564(b)(1) of the Act, 21 U.S.C. section 360bbb-3(b)(1), unless the authorization is terminated or revoked sooner.  Performed at [facility], [place] [address], [place], [place] [address]     Coagulation Studies: No results for input(s): LABPROT, INR in the last 72 hours.  Urinalysis: Recent Labs    06/10/20 1549 06/11/20 1325  COLORURINE YELLOW STRAW*  LABSPEC 1.008 1.008  PHURINE 5.0 5.0  GLUCOSEU NEGATIVE NEGATIVE  HGBUR SMALL* SMALL*  BILIRUBINUR NEGATIVE NEGATIVE  KETONESUR NEGATIVE NEGATIVE  PROTEINUR 100* 100*  NITRITE NEGATIVE NEGATIVE  LEUKOCYTESUR NEGATIVE NEGATIVE      Imaging: US RENAL  Result Date: 06/10/2020 CLINICAL DATA:  AK I EXAM: RENAL / URINARY TRACT ULTRASOUND COMPLETE COMPARISON:  None. FINDINGS: Right Kidney: Renal measurements: 9.3 x 3.9 x 4.3 cm = volume: 82 mL. Echogenicity appears mildly increased. No mass or hydronephrosis visualized. Left Kidney: Renal measurements: 9.2 x 4.6 x 5.1 cm = volume: 110 mL. Echogenicity appears mildly increased. No mass or hydronephrosis visualized. Bladder: Appears normal for degree of bladder distention. Other: None. IMPRESSION: Mild increase in bilateral renal echogenicity as can be seen in medical renal disease. Electronically Signed   By: Audie Pinto M.D.   On: 06/10/2020 14:02   ECHOCARDIOGRAM COMPLETE  Result Date: 06/10/2020    ECHOCARDIOGRAM REPORT   Patient Name:   Beverly Martin Date of Exam: 06/10/2020 Medical Rec #:  [id number]      Height:       63.0 in Accession #:    [id number]     Weight:       160.0 lb Date of Birth:  [birth year]      BSA:          1.759 m Patient Age:    39 years       BP:           144/104 mmHg Patient Gender: F              HR:           94 bpm. Exam Location:  Inpatient Procedure: 2D Echo Indications:    acute systolic chf 353.61  History:        Patient has prior history of Echocardiogram examinations, most                  recent 10/21/2017. CAD, Signs/Symptoms:anasarca; Risk                 Factors:Hypertension and Dyslipidemia.  Sonographer:    Johny Chess Referring Phys: [place]  1. Left ventricular ejection fraction, by estimation, is 45 to 50%. The left ventricle has mildly decreased function. The left ventricle demonstrates global hypokinesis. Left ventricular diastolic parameters are consistent with Grade II diastolic dysfunction (pseudonormalization).  2. Right ventricular systolic function is normal. The right ventricular size is normal.  3. The mitral valve is normal in structure. Mild mitral valve regurgitation. No evidence of mitral stenosis.  4. The aortic valve is normal in structure. Aortic valve regurgitation is not visualized. No  aortic stenosis is present.  5. The inferior vena cava is normal in size with greater than 50% respiratory variability, suggesting right atrial pressure of 3 mmHg. FINDINGS  Left Ventricle: Left ventricular ejection fraction, by estimation, is 45 to 50%. The left ventricle has mildly decreased function. The left ventricle demonstrates global hypokinesis. The left ventricular internal cavity size was normal in size. There is  no left ventricular hypertrophy. Left ventricular diastolic parameters are consistent with Grade II diastolic dysfunction (pseudonormalization). Right Ventricle: The right ventricular size is normal. No increase in right ventricular wall thickness. Right ventricular systolic function is normal. Left Atrium: Left atrial size was normal in size. Right Atrium: Right atrial size was normal in size. Pericardium: There is no evidence of pericardial effusion. Mitral Valve: The mitral valve is normal in structure. Normal mobility of the mitral valve leaflets. Mild mitral valve regurgitation. No evidence of mitral valve stenosis. Tricuspid Valve: The tricuspid valve is normal in structure. Tricuspid valve regurgitation is trivial. No evidence  of tricuspid stenosis. Aortic Valve: The aortic valve is normal in structure. Aortic valve regurgitation is not visualized. No aortic stenosis is present. Pulmonic Valve: The pulmonic valve was normal in structure. Pulmonic valve regurgitation is mild. No evidence of pulmonic stenosis. Aorta: The aortic root is normal in size and structure. Venous: The inferior vena cava is normal in size with greater than 50% respiratory variability, suggesting right atrial pressure of 3 mmHg. IAS/Shunts: No atrial level shunt detected by color flow Doppler.  LEFT VENTRICLE PLAX 2D LVIDd:         4.90 cm  Diastology LVIDs:         3.70 cm  LV e' lateral:   4.13 cm/s LV PW:         1.00 cm  LV E/e' lateral: 25.4 LV IVS:        1.00 cm LVOT diam:     1.65 cm LV SV:         40 LV SV Index:   23 LVOT Area:     2.14 cm  RIGHT VENTRICLE            IVC RV S prime:     8.49 cm/s  IVC diam: 1.60 cm TAPSE (M-mode): 1.3 cm LEFT ATRIUM             Index       RIGHT ATRIUM           Index LA diam:        4.00 cm 2.27 cm/m  RA Area:     13.70 cm LA Vol (A2C):   51.9 ml 29.51 ml/m RA Volume:   32.40 ml  18.42 ml/m LA Vol (A4C):   51.1 ml 29.06 ml/m LA Biplane Vol: 53.0 ml 30.14 ml/m  AORTIC VALVE LVOT Vmax:   103.00 cm/s LVOT Vmean:  65.000 cm/s LVOT VTI:    0.189 m  AORTA Ao Root diam: 2.80 cm Ao Asc diam:  2.80 cm MITRAL VALVE MV Area (PHT): 5.02 cm     SHUNTS MV Decel Time: 151 msec     Systemic VTI:  0.19 m MV E velocity: 105.00 cm/s  Systemic Diam: 1.65 cm MV A velocity: 92.40 cm/s MV E/A ratio:  1.14 Candee Furbish MD Electronically signed by Candee Furbish MD Signature Date/Time: 06/10/2020/5:10:40 PM    Final      Medications:   . sodium chloride     . aspirin EC  81 mg Oral Daily  .  atorvastatin  40 mg Oral Daily  . carvedilol  6.25 mg Oral BID WC  . enoxaparin (LOVENOX) injection  30 mg Subcutaneous Q24H  . furosemide  80 mg Intravenous BID  . insulin aspart  0-15 Units Subcutaneous TID WC  . insulin aspart  0-5 Units  Subcutaneous QHS  . sodium chloride flush  3 mL Intravenous Q12H   sodium chloride, hydrALAZINE, sodium chloride flush  Assessment/ Plan:  1.Renal-acute kidney injury in the setting of worsening congestive heart failure and hypertensive crisis.  Renal ultrasound showed no evidence of hydronephrosis.  Urine studies pending.  I believe this may represent some acute tubular necrosis secondary to her severe hypertension.  Blood pressure appears under better control at this point.  We will continue to follow and adjust medications as needed. 2. Hypertension/volume  -mild volume overload I would continue IV diuretics would recommend Lasix 80 mg every 12 hours. 3.  Anemia T sats 20%.  Will start ESA. 4.  Bones will check calcium phosphorus.  PTH elevated at 171.  We will start calcitriol 0.25 mcg daily 5.  New diagnosis of congestive heart failure would recommend cardiology involvement.  Would also check SPEP and spot urine protein electrophoresis for evaluation of monoclonal protein.    TSH within normal range    LOS: 2 Sherril Croon @TODAY @8 :04 AM

## 2020-06-12 NOTE — Progress Notes (Signed)
PROGRESS NOTE  Beverly Martin RXV:[id number] DOB: 21-Aug-1956 DOA: 06/09/2020 PCP: Billie Ruddy, MD  Brief History   Beverly Martin is a 64 y.o. female with medical history significant of HTN; HLD: DM; and CAD s/p CABG presenting with LE edema.  She came because her husband made her come - "I'm bad about not going to doctors."  She denies feeling bad but has had LE edema.  She has been working Health and safety inspector, standing all day).  Edema has been progressive for 2-3 months but worsened about 1 month ago.  Some SOB with exertion.  No cough.  No orthopnea.  No PND.  No chest pain.  She has been out of her medications for a year or two - she has not been taking any medications at all.    ED Course:  Likely new-onset/undiagnosed CHF.  Lost job a couple of months ago and couldn't afford to go to the doctor, doctor wouldn't refill meds.  Progressive edema, SOB.  2-3+ LE edema, edema on CXR.  Has AKI (creatinine 2.54).  Given Lasix and Labetolol.    The patient has been admitted to a telemetry bed. Diuresis has been continued. Nephrology has been consulted due to elevated creatinine.  Consultants  . Nephrology . Cardiology  Procedures  . None  Antibiotics   Anti-infectives (From admission, onward)   None     Subjective  The patient is sitting up at bedside. She states that her breathing is better, but she continues to have severe lower extremity edema.  Objective   Vitals:  Vitals:   06/12/20 1532 06/12/20 1730  BP: (!) 154/74 (!) 172/82  Pulse: 77 80  Resp: 15 14  Temp: 98 F (36.7 C)   SpO2: 98%    Exam:  Constitutional:  . The patient is awake, alert, and oriented x 3. No acute distress. Respiratory:  . No increased work of breathing. . No wheezes, rales, or rhonchi . No tactile fremitus Cardiovascular:  . Regular rate and rhythm . No murmurs, ectopy, or gallups. . No lateral PMI. No thrills. Marland Kitchen 4+ pitting edema of lower extremities bilaterally. Abdomen:   . Abdomen is soft, non-tender, non-distended . No hernias, masses, or organomegaly . Normoactive bowel sounds.  Musculoskeletal:  . No cyanosis, clubbing . 4+ pitting edema of lower extremities bilaterally. Skin:  . No rashes, lesions, ulcers . palpation of skin: no induration or nodules Neurologic:  . CN 2-12 intact . Sensation all 4 extremities intact Psychiatric:  . Mental status o Mood, affect appropriate o Orientation to person, place, time  . judgment and insight appear intact  I have personally reviewed the following:   Today's Data  . Vitals, BMP, Urinalysis  Cardiology Data  . EF 45-50%. Global hypokinesis.Grade II diastolic dysfunction  Scheduled Meds: . aspirin EC  81 mg Oral Daily  . atorvastatin  40 mg Oral Daily  . calcitRIOL  0.25 mcg Oral Daily  . carvedilol  6.25 mg Oral BID WC  . enoxaparin (LOVENOX) injection  30 mg Subcutaneous Q24H  . ferrous sulfate  325 mg Oral BID WC  . furosemide  80 mg Intravenous BID  . insulin aspart  0-15 Units Subcutaneous TID WC  . insulin aspart  0-5 Units Subcutaneous QHS  . sodium chloride flush  3 mL Intravenous Q12H   Continuous Infusions: . sodium chloride      Principal Problem:   Anasarca Active Problems:   Essential hypertension   Type 2 diabetes mellitus with hyperlipidemia (  [place])   CAD (coronary artery disease)   AKI (acute kidney injury) ([place])   Dyslipidemia   LOS: 2 days   A & P  Anasarca Patient presenting with worsening edema/anasarca extending all the way up to her groin; also with DOE. CSR was consistent with pulmonary edema. BNP is elevated. Echocardiogram had demonstrated EF 45-50%. Global hypokinesis.Grade II diastolic dysfunction. Cardiology has been consulted and CHF order set utilized.  Elevated troponins: Mildly elevated HS troponin is likely related to demand ischemia; doubt ACS based on symptoms but will trend. Cardiology consulted.  AKI on CKD: Nephrology consulted. AKI due to ATN  fron uncontrolled hypertension. Hyaline casts on urinalysis. Avoid nephrotoxic medications and hypotension. Renal ultrasound demonstrates mild increase in bilateral renal echogenicity consistent with medical renal disease. Renal function when last checked in 10/2017 was normal.   HTN: Poor control. The patient has been continued on Coreg 6.25 with as needed hydralazine.  HLD: Resume Lipitor 40 mg daily. Lipid panel reveals an LDL of 157. I do not believe that the patient was taking this at home either.   DM [id number] A1c of 8.2. Diet controlled. Glucoses will be followed with FSBS and SSI. She was supposed to be taking glucophage and glucotrol at home, but she was not.   CAD: s/p CABG. Resume ASA daily. Troponins elevated, although this is felt to be due to demand ischemia. She has been started on Coreg. Cardiology is consulted and the patient is being monitored on telemetry.  I have seen and examined this patient myself. I have spent 32 minutes in her evaluation and care.  DVT prophylaxis: Lovenox  Code Status:  Full  Family Communication: None present Disposition Plan:The patient is from: home             Anticipated d/c is to: home, possibly with [facility] services             Anticipated d/c date will depend on clinical response to treatment, likely 2-4 days             Patient is currently: acutely ill  Marcos Ruelas, DO [facility] Direct contact: see [email or web address]  7PM-7AM contact night coverage as above 06/12/2020, 6:01 PM  LOS: 1 day

## 2020-06-13 LAB — BASIC METABOLIC PANEL
Anion gap: 9 (ref 5–15)
BUN: 53 mg/dL — ABNORMAL HIGH (ref 8–23)
CO2: 31 mmol/L (ref 22–32)
Calcium: 8.4 mg/dL — ABNORMAL LOW (ref 8.9–10.3)
Chloride: 101 mmol/L (ref 98–111)
Creatinine, Ser: 3.08 mg/dL — ABNORMAL HIGH (ref 0.44–1.00)
GFR calc Af Amer: 18 mL/min — ABNORMAL LOW (ref >60)
GFR calc non Af Amer: 15 mL/min — ABNORMAL LOW (ref >60)
Glucose, Bld: 96 mg/dL (ref 70–99)
Potassium: 4.2 mmol/L (ref 3.5–5.1)
Sodium: 141 mmol/L (ref 135–145)

## 2020-06-13 LAB — GLUCOSE, CAPILLARY
Glucose-Capillary: 102 mg/dL — ABNORMAL HIGH (ref 70–99)
Glucose-Capillary: 189 mg/dL — ABNORMAL HIGH (ref 70–99)
Glucose-Capillary: 131 mg/dL — ABNORMAL HIGH (ref 70–99)
Glucose-Capillary: 197 mg/dL — ABNORMAL HIGH (ref 70–99)

## 2020-06-13 NOTE — Progress Notes (Signed)
[facility] ROUNDING NOTE   Subjective:   Brief history: 64 year old lady hypertension hyperlipidemia diabetes status post CABG.  She was admitted with volume overload and 2D echo demonstrating 45-50% ejection fraction with decreased global hypokinesis.  Serum creatinine on admission was 2.54 mg/dL increase slightly .  Previous creatinine 1.09 11/04/2017.  Renal ultrasound 06/10/2020 showed mild increase in bilateral renal echogenicity.  Serum protein electrophoresis s  negative M spike  Blood pressure 157/72 pulse 75 temperature 98.7  Sodium 141 potassium 4.2 chloride 101 CO2 31 creatinine 3.08 BUN 53 glucose 96  Aspirin 81 mg daily atorvastatin 40 mg daily Calcitrol 0.25 mcg daily Coreg 6.25 mg twice daily iron 325 mg twice daily.  Lasix 80 mg IV every 12 hours insulin sliding scale  PTH 171.  TSH 2.053  Objective:  Vital signs in last 24 hours:  Temp:  [98 F (36.7 C)-98.7 F (37.1 C)] 98.7 F (37.1 C) (09/01 0500) Pulse Rate:  [74-83] 74 (09/01 0500) Resp:  [14-16] 16 (09/01 0500) BP: (151-172)/(69-82) 157/72 (09/01 0500) SpO2:  [93 %-98 %] 93 % (09/01 0500) Weight:  [64.8 kg] 64.8 kg (09/01 0500)  Weight change: -1.452 kg Filed Weights   06/11/20 0543 06/12/20 0500 06/13/20 0500  Weight: 67.8 kg 66.3 kg 64.8 kg    Intake/Output: I/O last 3 completed shifts: In: 444 [P.O.:444] Out: 5150 [Urine:5150]   Intake/Output this shift:  No intake/output data recorded.  CVS- RRR no murmurs rubs or gallops RS- CTA no wheeze or rales ABD- BS present soft non-distended EXT-3+ edema   Basic Metabolic Panel: Recent Labs  Lab 06/09/20 1947 06/09/20 1947 06/11/20 0127 06/12/20 0327 06/13/20 0426  NA 142  --  142 142 141  K 5.1  --  5.0 4.5 4.2  CL 106  --  105 104 101  CO2 27  --  28 30 31   GLUCOSE 151*  --  172* 149* 96  BUN 39*  --  43* 47* 53*  CREATININE 2.54*  --  2.70* 3.01* 3.08*  CALCIUM 8.9   < > 8.6* 8.4* 8.4*   < > = values in this interval  not displayed.    Liver Function Tests: Recent Labs  Lab 06/09/20 1947  AST 12*  ALT 16  ALKPHOS 63  BILITOT 0.6  PROT 5.8*  ALBUMIN 3.4*   No results for input(s): LIPASE, AMYLASE in the last 168 hours. No results for input(s): AMMONIA in the last 168 hours.  CBC: Recent Labs  Lab 06/09/20 1947 06/11/20 0127  WBC 6.2 4.9  NEUTROABS  --  3.7  HGB 10.7* 9.6*  HCT 35.5* 30.9*  MCV 95.2 93.4  PLT 190 165    Cardiac Enzymes: No results for input(s): CKTOTAL, CKMB, CKMBINDEX, TROPONINI in the last 168 hours.  BNP: Invalid input(s): POCBNP  CBG: Recent Labs  Lab 06/12/20 0743 06/12/20 1118 06/12/20 1653 06/12/20 2116 06/13/20 0732  GLUCAP 123* 144* 102* 201* 102*    Microbiology: Results for orders placed or performed during the hospital encounter of 06/09/20  SARS Coronavirus 2 by RT PCR (hospital order, performed in [facility] hospital lab) Nasopharyngeal Nasopharyngeal Swab     Status: None   Collection Time: 06/10/20 11:34 AM   Specimen: Nasopharyngeal Swab  Result Value Ref Range Status   SARS Coronavirus 2 NEGATIVE NEGATIVE Final    Comment: (NOTE) SARS-CoV-2 target nucleic acids are NOT DETECTED.  The SARS-CoV-2 RNA is generally detectable in upper and lower respiratory specimens during the acute phase  of infection. The lowest concentration of SARS-CoV-2 viral copies this assay can detect is 250 copies / mL. A negative result does not preclude SARS-CoV-2 infection and should not be used as the sole basis for treatment or other patient management decisions.  A negative result may occur with improper specimen collection / handling, submission of specimen other than nasopharyngeal swab, presence of viral mutation(s) within the areas targeted by this assay, and inadequate number of viral copies (<250 copies / mL). A negative result must be combined with clinical observations, patient history, and epidemiological information.  Fact Sheet for Patients:    StrictlyIdeas.no  Fact Sheet for Healthcare Providers: BankingDealers.co.za  This test is not yet approved or  cleared by the [place] FDA and has been authorized for detection and/or diagnosis of SARS-CoV-2 by FDA under an Emergency Use Authorization (EUA).  This EUA will remain in effect (meaning this test can be used) for the duration of the COVID-19 declaration under Section 564(b)(1) of the Act, 21 U.S.C. section 360bbb-3(b)(1), unless the authorization is terminated or revoked sooner.  Performed at [facility], [place] [address], [place], [place] [address]     Coagulation Studies: No results for input(s): LABPROT, INR in the last 72 hours.  Urinalysis: Recent Labs    06/11/20 1325 06/12/20 0916  COLORURINE STRAW* YELLOW  LABSPEC 1.008 1.009  PHURINE 5.0 6.0  GLUCOSEU NEGATIVE NEGATIVE  HGBUR SMALL* SMALL*  BILIRUBINUR NEGATIVE NEGATIVE  KETONESUR NEGATIVE NEGATIVE  PROTEINUR 100* 100*  NITRITE NEGATIVE NEGATIVE  LEUKOCYTESUR NEGATIVE SMALL*      Imaging: No results found.   Medications:   . sodium chloride     . aspirin EC  81 mg Oral Daily  . atorvastatin  40 mg Oral Daily  . calcitRIOL  0.25 mcg Oral Daily  . carvedilol  6.25 mg Oral BID WC  . enoxaparin (LOVENOX) injection  30 mg Subcutaneous Q24H  . ferrous sulfate  325 mg Oral BID WC  . furosemide  80 mg Intravenous BID  . insulin aspart  0-15 Units Subcutaneous TID WC  . insulin aspart  0-5 Units Subcutaneous QHS  . sodium chloride flush  3 mL Intravenous Q12H   sodium chloride, hydrALAZINE, sodium chloride flush  Assessment/ Plan:  1.Renal-acute kidney injury in the setting of worsening congestive heart failure and hypertensive crisis.  Renal ultrasound showed no evidence of hydronephrosis.  Urine studies pending.  I believe this may represent some acute tubular necrosis secondary to her severe hypertension.  Blood pressure appears  under better control at this point.  We will continue to follow and adjust medications as needed. 2. Hypertension/volume  -mild volume overload I would continue IV diuretics.  Started 80 mg IV every 12 hours.  Good diuresis.  We will continue 3.  Anemia T sats 20%.  Darbepoetin 25 mcg given 06/12/2020 4.  Bones will check calcium phosphorus.  PTH elevated at 171.   calcitriol 0.25 mcg daily 5.  New diagnosis of congestive heart failure would recommend cardiology involvement.  Serum protein electrophoresis appears to be within normal range    LOS: 3 Sherril Croon @TODAY @7 :47 AM

## 2020-06-13 NOTE — Progress Notes (Addendum)
PROGRESS NOTE  Beverly Martin OEV:[id number] DOB: 1955/12/16 DOA: 06/09/2020 PCP: Billie Ruddy, MD  HPI/Recap of past 24 hours: Beverly Martin a 64 y.o.femalewith medical history significant ofHTN; HLD: DM2; and CAD s/p CABG presenting with B/L LE edema.She came because her husband made her come - "I'm bad about not going to doctors." She denies feeling bad but has had LE edema. She has been working Health and safety inspector, standing all day). Edema has been progressive for 2-3 months but worsened about 1 month ago. Some SOB with exertion. No cough. No orthopnea. No PND. No chest pain. She has been out of her medications for a year or two - she has not been taking any medications at all.   ED Course:Likely new-onset/undiagnosed CHF. Lost job a couple of months ago and couldn't afford to go to the doctor, doctor wouldn't refill meds. Progressive edema, SOB. 2-3+ LE edema, pulmonary edema on CXR. Has AKI (creatinine 2.54).   Nephrology has been consulted due to elevated creatinine.  06/13/20: Seen and examined at her bedside.  Reports her bilateral lower extremity edema is improving.  She is on IV diuretics.  No improvement in her creatinine level at this time.  Assessment/Plan: Principal Problem:   Anasarca Active Problems:   Essential hypertension   Type 2 diabetes mellitus with hyperlipidemia (HCC)   CAD (coronary artery disease)   AKI (acute kidney injury) ([place])   Dyslipidemia  Anasarca suspect multifactorial secondary to acute renal failure and acute systolic CHF. Patient presenting with worseningedema/anasarca extending all the way up to her groin; also with DOE. CXR was consistent with pulmonary edema. BNP is elevated > 3700. Echocardiogram 06/10/20 had demonstrated EF 45-50%. Global hypokinesis.Grade II diastolic dysfunction. Cardiology has been consulted and CHF order set utilized.  Elevated troponins, likely demand ischemia:  Denies any anginal  symptoms Mildly elevated HS troponin is likely related to demand ischemia; doubt ACS based on symptoms. Cardiology consulted.  Nonoliguric AKI:  Per medical record Baseline creatinine appears to be 1.0 with GFR greater than 60. Presented with creatinine of 2.5 with GFR of 22  Hyaline casts on urinalysis Creatinine uptrending  Nephrology following, appreciate assistance. Good urine output. Continue to avoid nephrotoxins Daily renal panel  Acute systolic CHF Presented with elevated BNP, bilateral lower extremity edema 2D echo done on 06/10/2020 showing LVEF 45 to 50% Continue cardiac medications Diuretics per nephrology Monitor strict I's and O's and daily weight Net I&O -7.8 L  Essential HTN:  BP is at goal Continue home Coreg Continue to monitor vital signs.  HLD:  ContinueLipitor 40 mg daily. Lipid panel reveals an LDL of 157.  DM II with hyperglycemia: Hemoglobin A1c of 8.2. Continue insulin sliding scale, sensitive.  CAD: s/p CABG.  Resume ASA daily, Coreg, and Lipitor   DVT prophylaxis:Lovenox subcu daily Code Status:Full Family Communication:None at bedside   Consultants:  Nephrology   Disposition Plan:     Status is: Inpatient   Dispo: The patient is from: Home.              Anticipated d/c is to: Home.              Anticipated d/c date is: 06/15/2020               Patient currently not stable for discharge due to ongoing management of AKI.       Objective: Vitals:   06/12/20 1730 06/12/20 2100 06/13/20 0500 06/13/20 0855  BP: (!) 172/82 (!) 155/69 Marland Kitchen)  157/72 134/63  Pulse: 80 79 74   Resp: 14 16 16 16   Temp:  98.1 F (36.7 C) 98.7 F (37.1 C)   TempSrc:  Oral Oral   SpO2:  96% 93% 98%  Weight:   64.8 kg   Height:        Intake/Output Summary (Last 24 hours) at 06/13/2020 1227 Last data filed at 06/13/2020 1156 Gross per 24 hour  Intake 444 ml  Output 3750 ml  Net -3306 ml   Filed Weights   06/11/20 0543 06/12/20 0500  06/13/20 0500  Weight: 67.8 kg 66.3 kg 64.8 kg    Exam:  . General: 64 y.o. year-old female well developed well nourished in no acute distress.  Alert and oriented x3. . Cardiovascular: Regular rate and rhythm with no rubs or gallops.  No thyromegaly or JVD noted.   Marland Kitchen Respiratory: Clear to auscultation with no wheezes or rales. Good inspiratory effort. . Abdomen: Soft nontender nondistended with normal bowel sounds x4 quadrants. . Musculoskeletal: 2+ pitting edema in lower extremities bilaterally.  Marland Kitchen Psychiatry: Mood is appropriate for condition and setting   Data Reviewed: CBC: Recent Labs  Lab 06/09/20 1947 06/11/20 0127  WBC 6.2 4.9  NEUTROABS  --  3.7  HGB 10.7* 9.6*  HCT 35.5* 30.9*  MCV 95.2 93.4  PLT 190 235   Basic Metabolic Panel: Recent Labs  Lab 06/09/20 1947 06/11/20 0127 06/12/20 0327 06/13/20 0426  NA 142 142 142 141  K 5.1 5.0 4.5 4.2  CL 106 105 104 101  CO2 27 28 30 31   GLUCOSE 151* 172* 149* 96  BUN 39* 43* 47* 53*  CREATININE 2.54* 2.70* 3.01* 3.08*  CALCIUM 8.9 8.6* 8.4* 8.4*   GFR: Estimated Creatinine Clearance: 15.7 mL/min (A) (by C-G formula based on SCr of 3.08 mg/dL (H)). Liver Function Tests: Recent Labs  Lab 06/09/20 1947  AST 12*  ALT 16  ALKPHOS 63  BILITOT 0.6  PROT 5.8*  ALBUMIN 3.4*   No results for input(s): LIPASE, AMYLASE in the last 168 hours. No results for input(s): AMMONIA in the last 168 hours. Coagulation Profile: No results for input(s): INR, PROTIME in the last 168 hours. Cardiac Enzymes: No results for input(s): CKTOTAL, CKMB, CKMBINDEX, TROPONINI in the last 168 hours. BNP (last 3 results) No results for input(s): PROBNP in the last 8760 hours. HbA1C: Recent Labs    06/10/20 1556  HGBA1C 8.2*   CBG: Recent Labs  Lab 06/12/20 1118 06/12/20 1653 06/12/20 2116 06/13/20 0732 06/13/20 1124  GLUCAP 144* 102* 201* 102* 189*   Lipid Profile: Recent Labs    06/10/20 1556  CHOL 245*  HDL 69   LDLCALC 157*  TRIG 93  CHOLHDL 3.6   Thyroid Function Tests: Recent Labs    06/10/20 1556  TSH 2.053   Anemia Panel: Recent Labs    06/11/20 1130  TIBC 255  IRON 50   Urine analysis:    Component Value Date/Time   COLORURINE YELLOW 06/12/2020 0916   APPEARANCEUR CLEAR 06/12/2020 0916   LABSPEC 1.009 06/12/2020 0916   PHURINE 6.0 06/12/2020 0916   GLUCOSEU NEGATIVE 06/12/2020 0916   HGBUR SMALL (A) 06/12/2020 0916   BILIRUBINUR NEGATIVE 06/12/2020 0916   KETONESUR NEGATIVE 06/12/2020 0916   PROTEINUR 100 (A) 06/12/2020 0916   UROBILINOGEN 0.2 10/20/2017 1115   NITRITE NEGATIVE 06/12/2020 0916   LEUKOCYTESUR SMALL (A) 06/12/2020 0916   Sepsis Labs: @LABRCNTIP (procalcitonin:4,lacticidven:4)  ) Recent Results (from the past 240 hour(s))  SARS Coronavirus 2 by RT PCR (hospital order, performed in [facility] hospital lab) Nasopharyngeal Nasopharyngeal Swab     Status: None   Collection Time: 06/10/20 11:34 AM   Specimen: Nasopharyngeal Swab  Result Value Ref Range Status   SARS Coronavirus 2 NEGATIVE NEGATIVE Final    Comment: (NOTE) SARS-CoV-2 target nucleic acids are NOT DETECTED.  The SARS-CoV-2 RNA is generally detectable in upper and lower respiratory specimens during the acute phase of infection. The lowest concentration of SARS-CoV-2 viral copies this assay can detect is 250 copies / mL. A negative result does not preclude SARS-CoV-2 infection and should not be used as the sole basis for treatment or other patient management decisions.  A negative result may occur with improper specimen collection / handling, submission of specimen other than nasopharyngeal swab, presence of viral mutation(s) within the areas targeted by this assay, and inadequate number of viral copies (<250 copies / mL). A negative result must be combined with clinical observations, patient history, and epidemiological information.  Fact Sheet for Patients:    StrictlyIdeas.no  Fact Sheet for Healthcare Providers: BankingDealers.co.za  This test is not yet approved or  cleared by the [place] FDA and has been authorized for detection and/or diagnosis of SARS-CoV-2 by FDA under an Emergency Use Authorization (EUA).  This EUA will remain in effect (meaning this test can be used) for the duration of the COVID-19 declaration under Section 564(b)(1) of the Act, 21 U.S.C. section 360bbb-3(b)(1), unless the authorization is terminated or revoked sooner.  Performed at [facility], [place] [address], [place], [place] [address]       Studies: No results found.  Scheduled Meds: . aspirin EC  81 mg Oral Daily  . atorvastatin  40 mg Oral Daily  . calcitRIOL  0.25 mcg Oral Daily  . carvedilol  6.25 mg Oral BID WC  . enoxaparin (LOVENOX) injection  30 mg Subcutaneous Q24H  . ferrous sulfate  325 mg Oral BID WC  . furosemide  80 mg Intravenous BID  . insulin aspart  0-15 Units Subcutaneous TID WC  . insulin aspart  0-5 Units Subcutaneous QHS  . sodium chloride flush  3 mL Intravenous Q12H    Continuous Infusions: . sodium chloride       LOS: 3 days     Kayleen Memos, MD [facility] Pager [phone]  If 7PM-7AM, please contact night-coverage [email or web address] Password [facility] 06/13/2020, 12:27 PM

## 2020-06-13 NOTE — Plan of Care (Signed)
  Problem: Activity: Goal: Risk for activity intolerance will decrease Outcome: Progressing   Problem: Nutrition: Goal: Adequate nutrition will be maintained Outcome: Progressing   Problem: Safety: Goal: Ability to remain free from injury will improve Outcome: Progressing   

## 2020-06-14 LAB — BASIC METABOLIC PANEL
Anion gap: 9 (ref 5–15)
BUN: 54 mg/dL — ABNORMAL HIGH (ref 8–23)
CO2: 33 mmol/L — ABNORMAL HIGH (ref 22–32)
Calcium: 8.7 mg/dL — ABNORMAL LOW (ref 8.9–10.3)
Chloride: 100 mmol/L (ref 98–111)
Creatinine, Ser: 3.36 mg/dL — ABNORMAL HIGH (ref 0.44–1.00)
GFR calc Af Amer: 16 mL/min — ABNORMAL LOW (ref >60)
GFR calc non Af Amer: 14 mL/min — ABNORMAL LOW (ref >60)
Glucose, Bld: 180 mg/dL — ABNORMAL HIGH (ref 70–99)
Potassium: 4.4 mmol/L (ref 3.5–5.1)
Sodium: 142 mmol/L (ref 135–145)

## 2020-06-14 LAB — GLUCOSE, CAPILLARY
Glucose-Capillary: 128 mg/dL — ABNORMAL HIGH (ref 70–99)
Glucose-Capillary: 154 mg/dL — ABNORMAL HIGH (ref 70–99)
Glucose-Capillary: 122 mg/dL — ABNORMAL HIGH (ref 70–99)
Glucose-Capillary: 195 mg/dL — ABNORMAL HIGH (ref 70–99)

## 2020-06-14 LAB — CBC
HCT: 34.1 % — ABNORMAL LOW (ref 36.0–46.0)
Hemoglobin: 10.8 g/dL — ABNORMAL LOW (ref 12.0–15.0)
MCH: 29.6 pg (ref 26.0–34.0)
MCHC: 31.7 g/dL (ref 30.0–36.0)
MCV: 93.4 fL (ref 80.0–100.0)
Platelets: 169 10*3/uL (ref 150–400)
RBC: 3.65 MIL/uL — ABNORMAL LOW (ref 3.87–5.11)
RDW: 13.2 % (ref 11.5–15.5)
WBC: 5 10*3/uL (ref 4.0–10.5)
nRBC: 0 % (ref 0.0–0.2)

## 2020-06-14 NOTE — Progress Notes (Signed)
[facility] ROUNDING NOTE   Subjective:   Brief history: 64 year old lady hypertension hyperlipidemia diabetes status post CABG.  She was admitted with volume overload and 2D echo demonstrating 45-50% ejection fraction with decreased global hypokinesis.  Serum creatinine on admission was 2.54 mg/dL increase slightly .  Previous creatinine 1.09 11/04/2017.  Renal ultrasound 06/10/2020 showed mild increase in bilateral renal echogenicity.  Serum protein electrophoresis s  negative M spike  Blood pressure 159/70 pulse 71 temperature 98.2 O2 sats 96% room air.  Urine output 4.2 L 06/13/2020  Sodium 142 potassium 4.4 chloride 100 CO2 33 BUN 54 creatinine 3.36 glucose 180 calcium 8.7 hemoglobin 10.8  Aspirin 81 mg daily atorvastatin 40 mg daily Calcitrol 0.25 mcg daily Coreg 6.25 mg twice daily iron 325 mg twice daily.  Lasix 80 mg IV every 12 hours insulin sliding scale  PTH 171.  TSH 2.053  Objective:  Vital signs in last 24 hours:  Temp:  [97.8 F (36.6 C)-98.7 F (37.1 C)] 98.2 F (36.8 C) (09/02 0817) Pulse Rate:  [69-79] 69 (09/02 0817) Resp:  [16-18] 16 (09/02 0817) BP: (126-165)/(61-78) 159/70 (09/02 0817) SpO2:  [93 %-97 %] 96 % (09/02 0817) Weight:  [63 kg] 63 kg (09/02 0452)  Weight change: -1.86 kg Filed Weights   06/12/20 0500 06/13/20 0500 06/14/20 0452  Weight: 66.3 kg 64.8 kg 63 kg    Intake/Output: I/O last 3 completed shifts: In: 444 [P.O.:444] Out: 5150 [Urine:5150]   Intake/Output this shift:  No intake/output data recorded.  CVS- RRR no murmurs rubs or gallops RS- CTA no wheeze or rales ABD- BS present soft non-distended EXT-3+ edema   Basic Metabolic Panel: Recent Labs  Lab 06/09/20 1947 06/09/20 1947 06/11/20 0127 06/11/20 0127 06/12/20 0327 06/13/20 0426 06/14/20 0345  NA 142  --  142  --  142 141 142  K 5.1  --  5.0  --  4.5 4.2 4.4  CL 106  --  105  --  104 101 100  CO2 27  --  28  --  30 31 33*  GLUCOSE 151*  --  172*  --   149* 96 180*  BUN 39*  --  43*  --  47* 53* 54*  CREATININE 2.54*  --  2.70*  --  3.01* 3.08* 3.36*  CALCIUM 8.9   < > 8.6*   < > 8.4* 8.4* 8.7*   < > = values in this interval not displayed.    Liver Function Tests: Recent Labs  Lab 06/09/20 1947  AST 12*  ALT 16  ALKPHOS 63  BILITOT 0.6  PROT 5.8*  ALBUMIN 3.4*   No results for input(s): LIPASE, AMYLASE in the last 168 hours. No results for input(s): AMMONIA in the last 168 hours.  CBC: Recent Labs  Lab 06/09/20 1947 06/11/20 0127 06/14/20 0345  WBC 6.2 4.9 5.0  NEUTROABS  --  3.7  --   HGB 10.7* 9.6* 10.8*  HCT 35.5* 30.9* 34.1*  MCV 95.2 93.4 93.4  PLT 190 165 169    Cardiac Enzymes: No results for input(s): CKTOTAL, CKMB, CKMBINDEX, TROPONINI in the last 168 hours.  BNP: Invalid input(s): POCBNP  CBG: Recent Labs  Lab 06/13/20 0732 06/13/20 1124 06/13/20 1659 06/13/20 2116 06/14/20 0813  GLUCAP 102* 189* 131* 197* 128*    Microbiology: Results for orders placed or performed during the hospital encounter of 06/09/20  SARS Coronavirus 2 by RT PCR (hospital order, performed in [facility] hospital lab) Nasopharyngeal Nasopharyngeal  Swab     Status: None   Collection Time: 06/10/20 11:34 AM   Specimen: Nasopharyngeal Swab  Result Value Ref Range Status   SARS Coronavirus 2 NEGATIVE NEGATIVE Final    Comment: (NOTE) SARS-CoV-2 target nucleic acids are NOT DETECTED.  The SARS-CoV-2 RNA is generally detectable in upper and lower respiratory specimens during the acute phase of infection. The lowest concentration of SARS-CoV-2 viral copies this assay can detect is 250 copies / mL. A negative result does not preclude SARS-CoV-2 infection and should not be used as the sole basis for treatment or other patient management decisions.  A negative result may occur with improper specimen collection / handling, submission of specimen other than nasopharyngeal swab, presence of viral mutation(s) within  the areas targeted by this assay, and inadequate number of viral copies (<250 copies / mL). A negative result must be combined with clinical observations, patient history, and epidemiological information.  Fact Sheet for Patients:   StrictlyIdeas.no  Fact Sheet for Healthcare Providers: BankingDealers.co.za  This test is not yet approved or  cleared by the [place] FDA and has been authorized for detection and/or diagnosis of SARS-CoV-2 by FDA under an Emergency Use Authorization (EUA).  This EUA will remain in effect (meaning this test can be used) for the duration of the COVID-19 declaration under Section 564(b)(1) of the Act, 21 U.S.C. section 360bbb-3(b)(1), unless the authorization is terminated or revoked sooner.  Performed at [facility], [place] [address] [place], [place] [address]     Coagulation Studies: No results for input(s): LABPROT, INR in the last 72 hours.  Urinalysis: Recent Labs    06/11/20 1325 06/12/20 0916  COLORURINE STRAW* YELLOW  LABSPEC 1.008 1.009  PHURINE 5.0 6.0  GLUCOSEU NEGATIVE NEGATIVE  HGBUR SMALL* SMALL*  BILIRUBINUR NEGATIVE NEGATIVE  KETONESUR NEGATIVE NEGATIVE  PROTEINUR 100* 100*  NITRITE NEGATIVE NEGATIVE  LEUKOCYTESUR NEGATIVE SMALL*      Imaging: No results found.   Medications:   . sodium chloride     . aspirin EC  81 mg Oral Daily  . atorvastatin  40 mg Oral Daily  . calcitRIOL  0.25 mcg Oral Daily  . carvedilol  6.25 mg Oral BID WC  . enoxaparin (LOVENOX) injection  30 mg Subcutaneous Q24H  . ferrous sulfate  325 mg Oral BID WC  . furosemide  80 mg Intravenous BID  . insulin aspart  0-15 Units Subcutaneous TID WC  . insulin aspart  0-5 Units Subcutaneous QHS  . sodium chloride flush  3 mL Intravenous Q12H   sodium chloride, hydrALAZINE, sodium chloride flush  Assessment/ Plan:  1.Renal-acute kidney injury in the setting of worsening congestive heart  failure and hypertensive crisis.  Renal ultrasound showed no evidence of hydronephrosis.  Urine studies pending.  I believe this may represent some acute tubular necrosis secondary to her severe hypertension.  Blood pressure appears under better control at this point.  We will continue to follow and adjust medications as needed.  Anticipate new baseline creatinine will be higher. 2. Hypertension/volume  -mild volume overload I would continue IV diuretics.  Started 80 mg IV every 12 hours.  Would continue diuresis at this point.  Appears to have great urine output.  We may be able to convert to oral diuretics in the next 2 to 3 days. 3.  Anemia T sats 20%.  Darbepoetin 25 mcg given 06/12/2020 4.  Bones will check calcium phosphorus.  PTH elevated at 171.   calcitriol 0.25 mcg  daily 5.  New diagnosis of congestive heart failure would recommend cardiology involvement.  Serum protein electrophoresis appears to be within normal range    LOS: [place] @TODAY @9 :04 AM

## 2020-06-14 NOTE — Telephone Encounter (Signed)
FYI

## 2020-06-14 NOTE — Progress Notes (Signed)
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 Edema has been progressive for 2-3 months but worsened about 1 month ago. Some SOB with exertion. No cough. No orthopnea. No PND. No chest pain. She has been out of her medications for a year or two - she has not been taking any medications at all. Lost job a couple of months ago and couldn't afford to go to the doctor, doctor wouldn't refill meds.  ED Course: Progressive edema, SOB. 2-3+ LE edema, pulmonary edema on CXR. Has AKI (creatinine 2.54).     Nephrology has been consulted due to elevated creatinine.  06/14/20: Seen and examined at her bedside.  Persistent bilateral lower extremity edema up to her thighs.  She has been elevating her lower extremities.  Seen by nephrology, ongoing diuresing.  Net I&O -10.5 L since admission.  Assessment/Plan: Principal Problem:   Anasarca Active Problems:   Essential hypertension   Type 2 diabetes mellitus with hyperlipidemia (HCC)   CAD (coronary artery disease)   AKI (acute kidney injury) ([place])   Dyslipidemia  Anasarca suspect multifactorial secondary to acute renal failure and acute systolic CHF. Patient presenting with worseningedema/anasarca extending all the way up to her upper thighs; also with DOE. CXR consistent with pulmonary edema. BNP is elevated > 3700. Echocardiogram 06/10/20 had demonstrated EF 45-50%. Global hypokinesis.Grade II diastolic dysfunction. Cardiology has been consulted and CHF order set utilized. Volume overload on exam. Ongoing diuresing.  Elevated troponins, likely demand  ischemia:  Troponin peaked at 32 Denies any anginal symptoms at the time of this visit. Mildly elevated HS troponin is likely related to demand ischemia; doubt ACS based on symptoms. Cardiology consulted.  Nonoliguric AKI:  Per medical record Baseline creatinine appears to be 1.0 with GFR greater than 60. Presented with creatinine of 2.5 with GFR of 22  Hyaline casts on urinalysis Creatinine uptrending  Nephrology following, appreciate assistance. Good urine output. Continue to avoid nephrotoxins Daily renal panel Care directed by nephrology  Acute systolic and diastolic CHF Presented with elevated BNP, bilateral lower extremity edema 2D echo done on 06/10/2020 showing LVEF 45 to 50% and type II diastolic dysfunction Continue cardiac medications Diuretics per nephrology Monitor strict I's and O's and daily weight Net I&O -10.5 L since admission.  Essential HTN:  BP stable Continue home Coreg, IV Lasix Continue to monitor vital signs.  HLD:  ContinueLipitor 40 mg daily.  Lipid panel reveals an LDL of 157.  DM II with hyperglycemia: Hemoglobin A1c of 8.2. Continue insulin sliding scale, sensitive.  CAD: s/p CABG.  Denies any anginal symptoms Continue ASA daily, Coreg, and Lipitor   DVT prophylaxis:Lovenox subcu daily Code Status:Full Family Communication:None at bedside   Consultants:  Nephrology   Disposition Plan:     Status is: Inpatient   Dispo: The patient is from: Home.              Anticipated d/c is to: Home.              Anticipated d/c date is: 06/16/2020  Patient currently not stable for discharge due to ongoing management of AKI.       Objective: Vitals:   06/14/20 0452 06/14/20 0817 06/14/20 1057 06/14/20 1144  BP: (!) 155/68 (!) 159/70 (!) 177/80 (!) 144/86  Pulse: 79 69 74 73  Resp: 17 16  16   Temp: 98 F (36.7 C) 98.2 F (36.8 C)  98.1 F (36.7 C)  TempSrc: Oral Oral  Oral  SpO2: 93% 96%  98%  Weight: 63  kg     Height:        Intake/Output Summary (Last 24 hours) at 06/14/2020 1606 Last data filed at 06/14/2020 1516 Gross per 24 hour  Intake 480 ml  Output 3400 ml  Net -2920 ml   Filed Weights   06/12/20 0500 06/13/20 0500 06/14/20 0452  Weight: 66.3 kg 64.8 kg 63 kg    Exam:  . General: 64 y.o. year-old female well-developed well-nourished no acute distress.  Alert oriented x3.   . Cardiovascular: Regular rate and rhythm no rubs or gallops.   Marland Kitchen Respiratory: Mild rales at bases no wheezing noted.  Good inspiratory effort.   . Abdomen: Soft nontender normal bowel sounds present.   . Musculoskeletal: 1+ pitting edema in lower extremities bilaterally.   Marland Kitchen Psychiatry: Mood is appropriate for condition and setting.  Data Reviewed: CBC: Recent Labs  Lab 06/09/20 1947 06/11/20 0127 06/14/20 0345  WBC 6.2 4.9 5.0  NEUTROABS  --  3.7  --   HGB 10.7* 9.6* 10.8*  HCT 35.5* 30.9* 34.1*  MCV 95.2 93.4 93.4  PLT 190 165 081   Basic Metabolic Panel: Recent Labs  Lab 06/09/20 1947 06/11/20 0127 06/12/20 0327 06/13/20 0426 06/14/20 0345  NA 142 142 142 141 142  K 5.1 5.0 4.5 4.2 4.4  CL 106 105 104 101 100  CO2 27 28 30 31  33*  GLUCOSE 151* 172* 149* 96 180*  BUN 39* 43* 47* 53* 54*  CREATININE 2.54* 2.70* 3.01* 3.08* 3.36*  CALCIUM 8.9 8.6* 8.4* 8.4* 8.7*   GFR: Estimated Creatinine Clearance: 14.4 mL/min (A) (by C-G formula based on SCr of 3.36 mg/dL (H)). Liver Function Tests: Recent Labs  Lab 06/09/20 1947  AST 12*  ALT 16  ALKPHOS 63  BILITOT 0.6  PROT 5.8*  ALBUMIN 3.4*   No results for input(s): LIPASE, AMYLASE in the last 168 hours. No results for input(s): AMMONIA in the last 168 hours. Coagulation Profile: No results for input(s): INR, PROTIME in the last 168 hours. Cardiac Enzymes: No results for input(s): CKTOTAL, CKMB, CKMBINDEX, TROPONINI in the last 168 hours. BNP (last 3 results) No results for input(s): PROBNP in the last 8760 hours. HbA1C: No  results for input(s): HGBA1C in the last 72 hours. CBG: Recent Labs  Lab 06/13/20 1124 06/13/20 1659 06/13/20 2116 06/14/20 0813 06/14/20 1142  GLUCAP 189* 131* 197* 128* 154*   Lipid Profile: No results for input(s): CHOL, HDL, LDLCALC, TRIG, CHOLHDL, LDLDIRECT in the last 72 hours. Thyroid Function Tests: No results for input(s): TSH, T4TOTAL, FREET4, T3FREE, THYROIDAB in the last 72 hours. Anemia Panel: No results for input(s): VITAMINB12, FOLATE, FERRITIN, TIBC, IRON, RETICCTPCT in the last 72 hours. Urine analysis:    Component Value Date/Time   COLORURINE YELLOW 06/12/2020 0916   APPEARANCEUR CLEAR 06/12/2020 0916   LABSPEC 1.009 06/12/2020 0916   PHURINE 6.0 06/12/2020 0916   GLUCOSEU NEGATIVE 06/12/2020 0916   HGBUR SMALL (A) 06/12/2020 0916   BILIRUBINUR NEGATIVE 06/12/2020 4481   Benjamin Stain  NEGATIVE 06/12/2020 0916   PROTEINUR 100 (A) 06/12/2020 0916   UROBILINOGEN 0.2 10/20/2017 1115   NITRITE NEGATIVE 06/12/2020 0916   LEUKOCYTESUR SMALL (A) 06/12/2020 0916   Sepsis Labs: @LABRCNTIP (procalcitonin:4,lacticidven:4)  ) Recent Results (from the past 240 hour(s))  SARS Coronavirus 2 by RT PCR (hospital order, performed in [facility] hospital lab) Nasopharyngeal Nasopharyngeal Swab     Status: None   Collection Time: 06/10/20 11:34 AM   Specimen: Nasopharyngeal Swab  Result Value Ref Range Status   SARS Coronavirus 2 NEGATIVE NEGATIVE Final    Comment: (NOTE) SARS-CoV-2 target nucleic acids are NOT DETECTED.  The SARS-CoV-2 RNA is generally detectable in upper and lower respiratory specimens during the acute phase of infection. The lowest concentration of SARS-CoV-2 viral copies this assay can detect is 250 copies / mL. A negative result does not preclude SARS-CoV-2 infection and should not be used as the sole basis for treatment or other patient management decisions.  A negative result may occur with improper specimen collection / handling, submission of  specimen other than nasopharyngeal swab, presence of viral mutation(s) within the areas targeted by this assay, and inadequate number of viral copies (<250 copies / mL). A negative result must be combined with clinical observations, patient history, and epidemiological information.  Fact Sheet for Patients:   StrictlyIdeas.no  Fact Sheet for Healthcare Providers: BankingDealers.co.za  This test is not yet approved or  cleared by the [place] FDA and has been authorized for detection and/or diagnosis of SARS-CoV-2 by FDA under an Emergency Use Authorization (EUA).  This EUA will remain in effect (meaning this test can be used) for the duration of the COVID-19 declaration under Section 564(b)(1) of the Act, 21 U.S.C. section 360bbb-3(b)(1), unless the authorization is terminated or revoked sooner.  Performed at [facility], [place] [address], [place], [place] [address]       Studies: No results found.  Scheduled Meds: . aspirin EC  81 mg Oral Daily  . atorvastatin  40 mg Oral Daily  . calcitRIOL  0.25 mcg Oral Daily  . carvedilol  6.25 mg Oral BID WC  . enoxaparin (LOVENOX) injection  30 mg Subcutaneous Q24H  . ferrous sulfate  325 mg Oral BID WC  . furosemide  80 mg Intravenous BID  . insulin aspart  0-15 Units Subcutaneous TID WC  . insulin aspart  0-5 Units Subcutaneous QHS  . sodium chloride flush  3 mL Intravenous Q12H    Continuous Infusions: . sodium chloride       LOS: 4 days     Kayleen Memos, MD [facility] Pager [phone]  If 7PM-7AM, please contact night-coverage [email or web address] Password [facility] 06/14/2020, 4:06 PM

## 2020-06-15 LAB — BASIC METABOLIC PANEL
Anion gap: 10 (ref 5–15)
BUN: 53 mg/dL — ABNORMAL HIGH (ref 8–23)
CO2: 38 mmol/L — ABNORMAL HIGH (ref 22–32)
Calcium: 8.7 mg/dL — ABNORMAL LOW (ref 8.9–10.3)
Chloride: 96 mmol/L — ABNORMAL LOW (ref 98–111)
Creatinine, Ser: 2.99 mg/dL — ABNORMAL HIGH (ref 0.44–1.00)
GFR calc Af Amer: 18 mL/min — ABNORMAL LOW (ref >60)
GFR calc non Af Amer: 16 mL/min — ABNORMAL LOW (ref >60)
Glucose, Bld: 168 mg/dL — ABNORMAL HIGH (ref 70–99)
Potassium: 4.4 mmol/L (ref 3.5–5.1)
Sodium: 144 mmol/L (ref 135–145)

## 2020-06-15 LAB — GLUCOSE, CAPILLARY
Glucose-Capillary: 144 mg/dL — ABNORMAL HIGH (ref 70–99)
Glucose-Capillary: 184 mg/dL — ABNORMAL HIGH (ref 70–99)
Glucose-Capillary: 178 mg/dL — ABNORMAL HIGH (ref 70–99)

## 2020-06-15 MED ORDER — FUROSEMIDE 80 MG PO TABS
80.0000 mg | ORAL_TABLET | Freq: Every day | ORAL | Status: DC
  Administered 2020-06-16 – 2020-06-19 (×4): 80 mg via ORAL
  Filled 2020-06-15 (×4): qty 1

## 2020-06-15 NOTE — Progress Notes (Addendum)
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 Edema has been progressive for 2-3 months but worsened about 1 month ago. Some SOB with exertion. No cough. No orthopnea. No PND. No chest pain. She has been out of her medications for a year or two - she has not been taking any medications at all. Lost job a couple of months ago and couldn't afford to go to the doctor, doctor wouldn't refill meds.  ED Course: Progressive edema, SOB. 2-3+ LE edema, pulmonary edema on CXR. Has AKI (creatinine 2.54).     Nephrology has been consulted due to elevated creatinine.  06/15/20: Seen and examined at her bedside.  Diuresing well.  Bilateral lower extremity edema improving.  Net I&O -13.3 L since admission.  Assessment/Plan: Principal Problem:   Anasarca Active Problems:   Essential hypertension   Type 2 diabetes mellitus with hyperlipidemia (HCC)   CAD (coronary artery disease)   AKI (acute kidney injury) ([place])   Dyslipidemia  Anasarca suspect multifactorial secondary to acute renal failure and acute systolic CHF. Patient presenting with worseningedema/anasarca extending all the way up to her upper thighs; also with DOE. CXR consistent with pulmonary edema. BNP is elevated > 3700. Echocardiogram 06/10/20 had demonstrated EF 45-50%. Global hypokinesis.Grade II diastolic dysfunction. Cardiology has been consulted and CHF order set utilized. Volume overload on exam. Ongoing diuresing.  Labs pending Started on po lasix 80 mg daily  Elevated troponins, likely demand ischemia:  Troponin peaked at 32 Denies any  anginal symptoms at the time of this visit. Mildly elevated HS troponin is likely related to demand ischemia; doubt ACS based on symptoms. Cardiology consulted.  Nonoliguric AKI:  Per medical record Baseline creatinine appears to be 1.0 with GFR greater than 60. Presented with creatinine of 2.5 with GFR of 22  Hyaline casts on urinalysis Creatinine uptrending  Nephrology following, appreciate assistance. Good urine output. Continue to avoid nephrotoxins Daily renal panel Care directed by nephrology  Acute systolic and diastolic CHF Presented with elevated BNP, bilateral lower extremity edema 2D echo done on 06/10/2020 showing LVEF 45 to 50% and type II diastolic dysfunction Continue cardiac medications Diuretics per nephrology Monitor strict I's and O's and daily weight Net I&O -13.3 L since admission.  Essential HTN:  BP stable Continue home Coreg, IV Lasix Continue to monitor vital signs.  HLD:  ContinueLipitor 40 mg daily.  Lipid panel reveals an LDL of 157.  DM II with hyperglycemia: Hemoglobin A1c of 8.2. Continue insulin sliding scale, sensitive.  CAD: s/p CABG.  Denies any anginal symptoms Continue ASA daily, Coreg, and Lipitor   DVT prophylaxis:Lovenox subcu daily Code Status:Full Family Communication:None at bedside   Consultants:  Nephrology   Disposition Plan:     Status is: Inpatient   Dispo: The patient is from: Home.              Anticipated d/c is to: Home.              Anticipated d/c date is: 06/16/2020               Patient currently not stable for  discharge due to ongoing management of AKI.       Objective: Vitals:   06/15/20 0335 06/15/20 0500 06/15/20 1023 06/15/20 1036  BP: 137/63   (!) 129/58  Pulse: 76  67 67  Resp: 18   20  Temp: 98.6 F (37 C)  98.1 F (36.7 C)   TempSrc: Oral  Oral   SpO2:      Weight:  60.4 kg    Height:        Intake/Output Summary (Last 24 hours) at 06/15/2020 1529 Last data filed at  06/15/2020 1345 Gross per 24 hour  Intake 1158 ml  Output 3975 ml  Net -2817 ml   Filed Weights   06/13/20 0500 06/14/20 0452 06/15/20 0500  Weight: 64.8 kg 63 kg 60.4 kg    Exam:  . General: 64 y.o. year-old female well developed well nourished in no acute distress.  A&O x3. . Cardiovascular: Regular rate and rhythm, no rubs or gallops . Respiratory: Clear to auscultation with no rales or wheezes . Abdomen: Soft non tender non distended with normal bowel sounds . Musculoskeletal: Improved edema in lower extremities bilaterally. Marland Kitchen Psychiatry: Mood is appropriate for condition and setting.   Data Reviewed: CBC: Recent Labs  Lab 06/09/20 1947 06/11/20 0127 06/14/20 0345  WBC 6.2 4.9 5.0  NEUTROABS  --  3.7  --   HGB 10.7* 9.6* 10.8*  HCT 35.5* 30.9* 34.1*  MCV 95.2 93.4 93.4  PLT 190 165 470   Basic Metabolic Panel: Recent Labs  Lab 06/09/20 1947 06/11/20 0127 06/12/20 0327 06/13/20 0426 06/14/20 0345  NA 142 142 142 141 142  K 5.1 5.0 4.5 4.2 4.4  CL 106 105 104 101 100  CO2 27 28 30 31  33*  GLUCOSE 151* 172* 149* 96 180*  BUN 39* 43* 47* 53* 54*  CREATININE 2.54* 2.70* 3.01* 3.08* 3.36*  CALCIUM 8.9 8.6* 8.4* 8.4* 8.7*   GFR: Estimated Creatinine Clearance: 14.4 mL/min (A) (by C-G formula based on SCr of 3.36 mg/dL (H)). Liver Function Tests: Recent Labs  Lab 06/09/20 1947  AST 12*  ALT 16  ALKPHOS 63  BILITOT 0.6  PROT 5.8*  ALBUMIN 3.4*   No results for input(s): LIPASE, AMYLASE in the last 168 hours. No results for input(s): AMMONIA in the last 168 hours. Coagulation Profile: No results for input(s): INR, PROTIME in the last 168 hours. Cardiac Enzymes: No results for input(s): CKTOTAL, CKMB, CKMBINDEX, TROPONINI in the last 168 hours. BNP (last 3 results) No results for input(s): PROBNP in the last 8760 hours. HbA1C: No results for input(s): HGBA1C in the last 72 hours. CBG: Recent Labs  Lab 06/14/20 0813 06/14/20 1142 06/14/20 1622  06/14/20 2214 06/15/20 1128  GLUCAP 128* 154* 122* 195* 144*   Lipid Profile: No results for input(s): CHOL, HDL, LDLCALC, TRIG, CHOLHDL, LDLDIRECT in the last 72 hours. Thyroid Function Tests: No results for input(s): TSH, T4TOTAL, FREET4, T3FREE, THYROIDAB in the last 72 hours. Anemia Panel: No results for input(s): VITAMINB12, FOLATE, FERRITIN, TIBC, IRON, RETICCTPCT in the last 72 hours. Urine analysis:    Component Value Date/Time   COLORURINE YELLOW 06/12/2020 0916   APPEARANCEUR CLEAR 06/12/2020 0916   LABSPEC 1.009 06/12/2020 0916   PHURINE 6.0 06/12/2020 0916   GLUCOSEU NEGATIVE 06/12/2020 0916   HGBUR SMALL (A) 06/12/2020 0916   BILIRUBINUR NEGATIVE 06/12/2020 0916   KETONESUR NEGATIVE 06/12/2020 0916   PROTEINUR 100 (A) 06/12/2020 0916   UROBILINOGEN 0.2 10/20/2017 1115   NITRITE  NEGATIVE 06/12/2020 0916   LEUKOCYTESUR SMALL (A) 06/12/2020 0916   Sepsis Labs: @LABRCNTIP (procalcitonin:4,lacticidven:4)  ) Recent Results (from the past 240 hour(s))  SARS Coronavirus 2 by RT PCR (hospital order, performed in [facility] hospital lab) Nasopharyngeal Nasopharyngeal Swab     Status: None   Collection Time: 06/10/20 11:34 AM   Specimen: Nasopharyngeal Swab  Result Value Ref Range Status   SARS Coronavirus 2 NEGATIVE NEGATIVE Final    Comment: (NOTE) SARS-CoV-2 target nucleic acids are NOT DETECTED.  The SARS-CoV-2 RNA is generally detectable in upper and lower respiratory specimens during the acute phase of infection. The lowest concentration of SARS-CoV-2 viral copies this assay can detect is 250 copies / mL. A negative result does not preclude SARS-CoV-2 infection and should not be used as the sole basis for treatment or other patient management decisions.  A negative result may occur with improper specimen collection / handling, submission of specimen other than nasopharyngeal swab, presence of viral mutation(s) within the areas targeted by this assay, and  inadequate number of viral copies (<250 copies / mL). A negative result must be combined with clinical observations, patient history, and epidemiological information.  Fact Sheet for Patients:   StrictlyIdeas.no  Fact Sheet for Healthcare Providers: BankingDealers.co.za  This test is not yet approved or  cleared by the [place] FDA and has been authorized for detection and/or diagnosis of SARS-CoV-2 by FDA under an Emergency Use Authorization (EUA).  This EUA will remain in effect (meaning this test can be used) for the duration of the COVID-19 declaration under Section 564(b)(1) of the Act, 21 U.S.C. section 360bbb-3(b)(1), unless the authorization is terminated or revoked sooner.  Performed at [facility], [place] [address], [place],  [address]       Studies: No results found.  Scheduled Meds: . aspirin EC  81 mg Oral Daily  . atorvastatin  40 mg Oral Daily  . calcitRIOL  0.25 mcg Oral Daily  . carvedilol  6.25 mg Oral BID WC  . enoxaparin (LOVENOX) injection  30 mg Subcutaneous Q24H  . ferrous sulfate  325 mg Oral BID WC  . furosemide  80 mg Oral Daily  . insulin aspart  0-15 Units Subcutaneous TID WC  . insulin aspart  0-5 Units Subcutaneous QHS  . sodium chloride flush  3 mL Intravenous Q12H    Continuous Infusions: . sodium chloride       LOS: 5 days     Kayleen Memos, MD [facility] Pager [phone]  If 7PM-7AM, please contact night-coverage [email or web address] Password [facility] 06/15/2020, 3:28 PM

## 2020-06-15 NOTE — Progress Notes (Addendum)
[facility] ROUNDING NOTE   Subjective:   Brief history: 64 year old lady hypertension hyperlipidemia diabetes status post CABG.  She was admitted with volume overload and 2D echo demonstrating 45-50% ejection fraction with decreased global hypokinesis.  Serum creatinine on admission was 2.54 mg/dL increase slightly .  Previous creatinine 1.09 11/04/2017.  Renal ultrasound 06/10/2020 showed mild increase in bilateral renal echogenicity.  Serum protein electrophoresis s  negative M spike  Blood pressure 137/63 pulse 76 temperature 98.6 urine output 2.6 L 06/14/2020  Labs pending 06/15/2020  Aspirin 81 mg daily atorvastatin 40 mg daily Calcitrol 0.25 mcg daily Coreg 6.25 mg twice daily iron 325 mg twice daily.  Lasix 80 mg IV every 12 hours insulin sliding scale  PTH 171.  TSH 2.053  Objective:  Vital signs in last 24 hours:  Temp:  [98.1 F (36.7 C)-99.1 F (37.3 C)] 98.6 F (37 C) (09/03 0335) Pulse Rate:  [73-82] 76 (09/03 0335) Resp:  [16-18] 18 (09/03 0335) BP: (137-177)/(59-86) 137/63 (09/03 0335) SpO2:  [98 %] 98 % (09/02 1144) Weight:  [60.4 kg] 60.4 kg (09/03 0500)  Weight change: -2.586 kg Filed Weights   06/13/20 0500 06/14/20 0452 06/15/20 0500  Weight: 64.8 kg 63 kg 60.4 kg    Intake/Output: I/O last 3 completed shifts: In: 1200 [P.O.:1200] Out: 5401 [Urine:5400; Stool:1]   Intake/Output this shift:  No intake/output data recorded.  CVS- RRR no murmurs rubs or gallops RS- CTA no wheeze or rales ABD- BS present soft non-distended EXT- trace edema   Basic Metabolic Panel: Recent Labs  Lab 06/09/20 1947 06/09/20 1947 06/11/20 0127 06/11/20 0127 06/12/20 0327 06/13/20 0426 06/14/20 0345  NA 142  --  142  --  142 141 142  K 5.1  --  5.0  --  4.5 4.2 4.4  CL 106  --  105  --  104 101 100  CO2 27  --  28  --  30 31 33*  GLUCOSE 151*  --  172*  --  149* 96 180*  BUN 39*  --  43*  --  47* 53* 54*  CREATININE 2.54*  --  2.70*  --  3.01* 3.08*  3.36*  CALCIUM 8.9   < > 8.6*   < > 8.4* 8.4* 8.7*   < > = values in this interval not displayed.    Liver Function Tests: Recent Labs  Lab 06/09/20 1947  AST 12*  ALT 16  ALKPHOS 63  BILITOT 0.6  PROT 5.8*  ALBUMIN 3.4*   No results for input(s): LIPASE, AMYLASE in the last 168 hours. No results for input(s): AMMONIA in the last 168 hours.  CBC: Recent Labs  Lab 06/09/20 1947 06/11/20 0127 06/14/20 0345  WBC 6.2 4.9 5.0  NEUTROABS  --  3.7  --   HGB 10.7* 9.6* 10.8*  HCT 35.5* 30.9* 34.1*  MCV 95.2 93.4 93.4  PLT 190 165 169    Cardiac Enzymes: No results for input(s): CKTOTAL, CKMB, CKMBINDEX, TROPONINI in the last 168 hours.  BNP: Invalid input(s): POCBNP  CBG: Recent Labs  Lab 06/13/20 2116 06/14/20 0813 06/14/20 1142 06/14/20 1622 06/14/20 2214  GLUCAP 197* 128* 154* 122* 195*    Microbiology: Results for orders placed or performed during the hospital encounter of 06/09/20  SARS Coronavirus 2 by RT PCR (hospital order, performed in [facility] hospital lab) Nasopharyngeal Nasopharyngeal Swab     Status: None   Collection Time: 06/10/20 11:34 AM   Specimen: Nasopharyngeal Swab  Result Value Ref Range Status   SARS Coronavirus 2 NEGATIVE NEGATIVE Final    Comment: (NOTE) SARS-CoV-2 target nucleic acids are NOT DETECTED.  The SARS-CoV-2 RNA is generally detectable in upper and lower respiratory specimens during the acute phase of infection. The lowest concentration of SARS-CoV-2 viral copies this assay can detect is 250 copies / mL. A negative result does not preclude SARS-CoV-2 infection and should not be used as the sole basis for treatment or other patient management decisions.  A negative result may occur with improper specimen collection / handling, submission of specimen other than nasopharyngeal swab, presence of viral mutation(s) within the areas targeted by this assay, and inadequate number of viral copies (<250 copies / mL). A negative  result must be combined with clinical observations, patient history, and epidemiological information.  Fact Sheet for Patients:   StrictlyIdeas.no  Fact Sheet for Healthcare Providers: BankingDealers.co.za  This test is not yet approved or  cleared by the [place] FDA and has been authorized for detection and/or diagnosis of SARS-CoV-2 by FDA under an Emergency Use Authorization (EUA).  This EUA will remain in effect (meaning this test can be used) for the duration of the COVID-19 declaration under Section 564(b)(1) of the Act, 21 U.S.C. section 360bbb-3(b)(1), unless the authorization is terminated or revoked sooner.  Performed at [facility], [place] [address], [place], [place] [address]     Coagulation Studies: No results for input(s): LABPROT, INR in the last 72 hours.  Urinalysis: Recent Labs    06/12/20 0916  COLORURINE YELLOW  LABSPEC 1.009  PHURINE 6.0  GLUCOSEU NEGATIVE  HGBUR SMALL*  BILIRUBINUR NEGATIVE  KETONESUR NEGATIVE  PROTEINUR 100*  NITRITE NEGATIVE  LEUKOCYTESUR SMALL*      Imaging: No results found.   Medications:   . sodium chloride     . aspirin EC  81 mg Oral Daily  . atorvastatin  40 mg Oral Daily  . calcitRIOL  0.25 mcg Oral Daily  . carvedilol  6.25 mg Oral BID WC  . enoxaparin (LOVENOX) injection  30 mg Subcutaneous Q24H  . ferrous sulfate  325 mg Oral BID WC  . furosemide  80 mg Intravenous BID  . insulin aspart  0-15 Units Subcutaneous TID WC  . insulin aspart  0-5 Units Subcutaneous QHS  . sodium chloride flush  3 mL Intravenous Q12H   sodium chloride, hydrALAZINE, sodium chloride flush  Assessment/ Plan:  1.Renal-acute kidney injury in the setting of worsening congestive heart failure and hypertensive crisis.  Renal ultrasound showed no evidence of hydronephrosis.  Urine studies pending.  I believe this may represent some acute tubular necrosis secondary to her severe  hypertension.  Blood pressure appears under better control at this point.  We will continue to follow and adjust medications as needed.  Anticipate new baseline creatinine will be higher.  Labs pending 06/15/2020 2. Hypertension/volume  -mild volume overload I would continue IV diuretics.  Started 80 mg IV every 12 hours.  Would continue diuresis at this point.  Appears to have great urine output.   Will start oral lasix 80 mg daily 3.  Anemia T sats 20%.  Darbepoetin 25 mcg given 06/12/2020 4.  Bones will check calcium phosphorus.  PTH elevated at 171.   calcitriol 0.25 mcg daily 5.  New diagnosis of congestive heart failure would recommend cardiology involvement.  Serum protein electrophoresis appears to be within normal range    LOS: [place] @TODAY @8 :32 AM

## 2020-06-16 LAB — GLUCOSE, CAPILLARY
Glucose-Capillary: 133 mg/dL — ABNORMAL HIGH (ref 70–99)
Glucose-Capillary: 190 mg/dL — ABNORMAL HIGH (ref 70–99)
Glucose-Capillary: 122 mg/dL — ABNORMAL HIGH (ref 70–99)
Glucose-Capillary: 169 mg/dL — ABNORMAL HIGH (ref 70–99)

## 2020-06-16 LAB — BLOOD GAS, ARTERIAL
Acid-Base Excess: 10 mmol/L — ABNORMAL HIGH (ref 0.0–2.0)
Bicarbonate: 35 mmol/L — ABNORMAL HIGH (ref 20.0–28.0)
Drawn by: 28338
FIO2: 21
O2 Saturation: 96.4 %
Patient temperature: 37.1
pCO2 arterial: 56.4 mmHg — ABNORMAL HIGH (ref 32.0–48.0)
pH, Arterial: 7.41 (ref 7.350–7.450)
pO2, Arterial: 84.1 mmHg (ref 83.0–108.0)

## 2020-06-16 LAB — BASIC METABOLIC PANEL
Anion gap: 9 (ref 5–15)
BUN: 51 mg/dL — ABNORMAL HIGH (ref 8–23)
CO2: 33 mmol/L — ABNORMAL HIGH (ref 22–32)
Calcium: 8.8 mg/dL — ABNORMAL LOW (ref 8.9–10.3)
Chloride: 101 mmol/L (ref 98–111)
Creatinine, Ser: 2.71 mg/dL — ABNORMAL HIGH (ref 0.44–1.00)
GFR calc Af Amer: 21 mL/min — ABNORMAL LOW (ref >60)
GFR calc non Af Amer: 18 mL/min — ABNORMAL LOW (ref >60)
Glucose, Bld: 180 mg/dL — ABNORMAL HIGH (ref 70–99)
Potassium: 4.6 mmol/L (ref 3.5–5.1)
Sodium: 143 mmol/L (ref 135–145)

## 2020-06-16 LAB — SODIUM, URINE, RANDOM: Sodium, Ur: 114 mmol/L

## 2020-06-16 NOTE — Progress Notes (Signed)
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 Edema has been progressive for 2-3 months but worsened about 1 month ago. Some SOB with exertion. No cough. No orthopnea. No PND. No chest pain. She has been out of her medications for a year or two -Lost job a couple of months ago and couldn't afford to go to the doctor, doctor wouldn't refill meds.  ED Course:2-3+ LE pitting edema, pulmonary edema on CXR. AKI (creatinine 2.54).     Nephrology consulted and following.  Appreciate assistance.    06/16/20: Seen and examined at her bedside.  Feels better today.  Bilateral lower extremity edema improving.  Good diuresing -14.2 L.  Assessment/Plan: Principal Problem:   Anasarca Active Problems:   Essential hypertension   Type 2 diabetes mellitus with hyperlipidemia (HCC)   CAD (coronary artery disease)   AKI (acute kidney injury) ([place])   Dyslipidemia  Anasarca suspect multifactorial secondary to acute renal failure and acute systolic CHF. Patient presenting with worseningedema/anasarca extending all the way up to her upper thighs; also with DOE. CXR consistent with pulmonary edema. BNP is elevated > 3700. Echocardiogram 06/10/20 had demonstrated EF 45-50%. Global hypokinesis.Grade II diastolic dysfunction. Cardiology has been consulted and CHF order set utilized. Volume overload on exam, improving. Ongoing diuresing with p.o. Lasix 80 mg daily.  Appreciate nephrology's assistance  Elevated troponins, likely demand ischemia:  Troponin peaked at 32 Denies any anginal symptoms at the time of  this visit. Cardiology consulted  Nonoliguric AKI:  Per medical record Baseline creatinine appears to be 1.0 with GFR greater than 60. Presented with creatinine of 2.5 with GFR of 22  Hyaline casts on urinalysis Nephrology following, appreciate assistance. Creatinine downtrending, 2.7 from 3.36. Good urine output. Continue to avoid nephrotoxins Daily renal panel Care directed by nephrology  Acute systolic and diastolic CHF Presented with elevated BNP, bilateral lower extremity edema 2D echo done on 06/10/2020 showing LVEF 45 to 50% and type II diastolic dysfunction Continue cardiac medications Diuretics per nephrology Monitor strict I's and O's and daily weight Net I&O -14.2 L  Essential HTN:  BP stable Continue home Coreg, p.o. Lasix Continue to monitor vital signs.  HLD:  ContinueLipitor 40 mg daily.  Lipid panel reveals an LDL of 157.  DM II with hyperglycemia: Hemoglobin A1c of 8.2. Continue insulin sliding scale, sensitive.  CAD: s/p CABG.  Denies any anginal symptoms Continue ASA daily, Coreg, and Lipitor   DVT prophylaxis:Lovenox subcu daily Code Status:Full Family Communication:None at bedside   Consultants:  Nephrology   Disposition Plan:     Status is: Inpatient   Dispo: The patient is from: Home.              Anticipated d/c is to: Home.              Anticipated d/c date is: 06/17/2020               Patient currently not stable for discharge due to ongoing management of AKI and anasarca.       Objective: Vitals:   06/16/20 0734 06/16/20 0805 06/16/20 0900 06/16/20  1320  BP: (!) 142/71  134/63 (!) 149/61  Pulse: 75  68 75  Resp:  20  14  Temp:  98.8 F (37.1 C)  98.6 F (37 C)  TempSrc:  Oral  Oral  SpO2:    93%  Weight:      Height:        Intake/Output Summary (Last 24 hours) at 06/16/2020 1359 Last data filed at 06/16/2020 1053 Gross per 24 hour  Intake 813 ml  Output 1650 ml  Net -837 ml   Filed Weights    06/14/20 0452 06/15/20 0500 06/16/20 0414  Weight: 63 kg 60.4 kg 60.3 kg    Exam:  . General: 64 y.o. year-old female pleasant well-developed well-nourished no acute distress.  Alert oriented x3. . Cardiovascular: Regular rate and rhythm no rubs or gallops.  No JVD autonomically noted. Marland Kitchen Respiratory: Clear to auscultation no wheezes or rales.   . Abdomen: Soft nontender normal bowel sounds present. . Musculoskeletal: Lower extremity edema improved.   Marland Kitchen Psychiatry: Mood is appropriate for condition and setting.  Data Reviewed: CBC: Recent Labs  Lab 06/09/20 1947 06/11/20 0127 06/14/20 0345  WBC 6.2 4.9 5.0  NEUTROABS  --  3.7  --   HGB 10.7* 9.6* 10.8*  HCT 35.5* 30.9* 34.1*  MCV 95.2 93.4 93.4  PLT 190 165 956   Basic Metabolic Panel: Recent Labs  Lab 06/12/20 0327 06/13/20 0426 06/14/20 0345 06/15/20 1438 06/16/20 0926  NA 142 141 142 144 143  K 4.5 4.2 4.4 4.4 4.6  CL 104 101 100 96* 101  CO2 30 31 33* 38* 33*  GLUCOSE 149* 96 180* 168* 180*  BUN 47* 53* 54* 53* 51*  CREATININE 3.01* 3.08* 3.36* 2.99* 2.71*  CALCIUM 8.4* 8.4* 8.7* 8.7* 8.8*   GFR: Estimated Creatinine Clearance: 17.9 mL/min (A) (by C-G formula based on SCr of 2.71 mg/dL (H)). Liver Function Tests: Recent Labs  Lab 06/09/20 1947  AST 12*  ALT 16  ALKPHOS 63  BILITOT 0.6  PROT 5.8*  ALBUMIN 3.4*   No results for input(s): LIPASE, AMYLASE in the last 168 hours. No results for input(s): AMMONIA in the last 168 hours. Coagulation Profile: No results for input(s): INR, PROTIME in the last 168 hours. Cardiac Enzymes: No results for input(s): CKTOTAL, CKMB, CKMBINDEX, TROPONINI in the last 168 hours. BNP (last 3 results) No results for input(s): PROBNP in the last 8760 hours. HbA1C: No results for input(s): HGBA1C in the last 72 hours. CBG: Recent Labs  Lab 06/15/20 1128 06/15/20 1634 06/15/20 2046 06/16/20 0724 06/16/20 1144  GLUCAP 144* 184* 178* 133* 190*   Lipid Profile: No  results for input(s): CHOL, HDL, LDLCALC, TRIG, CHOLHDL, LDLDIRECT in the last 72 hours. Thyroid Function Tests: No results for input(s): TSH, T4TOTAL, FREET4, T3FREE, THYROIDAB in the last 72 hours. Anemia Panel: No results for input(s): VITAMINB12, FOLATE, FERRITIN, TIBC, IRON, RETICCTPCT in the last 72 hours. Urine analysis:    Component Value Date/Time   COLORURINE YELLOW 06/12/2020 0916   APPEARANCEUR CLEAR 06/12/2020 0916   LABSPEC 1.009 06/12/2020 0916   PHURINE 6.0 06/12/2020 0916   GLUCOSEU NEGATIVE 06/12/2020 0916   HGBUR SMALL (A) 06/12/2020 0916   BILIRUBINUR NEGATIVE 06/12/2020 0916   KETONESUR NEGATIVE 06/12/2020 0916   PROTEINUR 100 (A) 06/12/2020 0916   UROBILINOGEN 0.2 10/20/2017 1115   NITRITE NEGATIVE 06/12/2020 0916   LEUKOCYTESUR SMALL (A) 06/12/2020 0916   Sepsis Labs: @LABRCNTIP (procalcitonin:4,lacticidven:4)  ) Recent Results (from the past 240  hour(s))  SARS Coronavirus 2 by RT PCR (hospital order, performed in [facility] hospital lab) Nasopharyngeal Nasopharyngeal Swab     Status: None   Collection Time: 06/10/20 11:34 AM   Specimen: Nasopharyngeal Swab  Result Value Ref Range Status   SARS Coronavirus 2 NEGATIVE NEGATIVE Final    Comment: (NOTE) SARS-CoV-2 target nucleic acids are NOT DETECTED.  The SARS-CoV-2 RNA is generally detectable in upper and lower respiratory specimens during the acute phase of infection. The lowest concentration of SARS-CoV-2 viral copies this assay can detect is 250 copies / mL. A negative result does not preclude SARS-CoV-2 infection and should not be used as the sole basis for treatment or other patient management decisions.  A negative result may occur with improper specimen collection / handling, submission of specimen other than nasopharyngeal swab, presence of viral mutation(s) within the areas targeted by this assay, and inadequate number of viral copies (<250 copies / mL). A negative result must be combined  with clinical observations, patient history, and epidemiological information.  Fact Sheet for Patients:   StrictlyIdeas.no  Fact Sheet for Healthcare Providers: BankingDealers.co.za  This test is not yet approved or  cleared by the [place] FDA and has been authorized for detection and/or diagnosis of SARS-CoV-2 by FDA under an Emergency Use Authorization (EUA).  This EUA will remain in effect (meaning this test can be used) for the duration of the COVID-19 declaration under Section 564(b)(1) of the Act, 21 U.S.C. section 360bbb-3(b)(1), unless the authorization is terminated or revoked sooner.  Performed at [facility], [place] [address], [place], [place] [address]       Studies: No results found.  Scheduled Meds: . aspirin EC  81 mg Oral Daily  . atorvastatin  40 mg Oral Daily  . calcitRIOL  0.25 mcg Oral Daily  . carvedilol  6.25 mg Oral BID WC  . enoxaparin (LOVENOX) injection  30 mg Subcutaneous Q24H  . ferrous sulfate  325 mg Oral BID WC  . furosemide  80 mg Oral Daily  . insulin aspart  0-15 Units Subcutaneous TID WC  . insulin aspart  0-5 Units Subcutaneous QHS  . sodium chloride flush  3 mL Intravenous Q12H    Continuous Infusions: . sodium chloride       LOS: 6 days     Kayleen Memos, MD [facility] Pager [phone]  If 7PM-7AM, please contact night-coverage [email or web address] Password [email or web address] 06/16/2020, 1:59 PM

## 2020-06-16 NOTE — Progress Notes (Signed)
[facility] ROUNDING NOTE   Subjective:   Brief history: 64 year old lady hypertension hyperlipidemia diabetes status post CABG.  She was admitted with volume overload and 2D echo demonstrating 45-50% ejection fraction with decreased global hypokinesis.  Serum creatinine on admission was 2.54 mg/dL increase slightly .  Previous creatinine 1.09 11/04/2017.  Renal ultrasound 06/10/2020 showed mild increase in bilateral renal echogenicity.  Serum protein electrophoresis s  negative M spike  Blood pressure 142/71 pulse 75 temperature 98.8 O2 sats 98%.  Urine output 3.7 L 06/15/2020  Sodium 144 potassium 4.4 chloride 96 CO2 38 BUN 53 creatinine 2.99 glucose 168 calcium 8.7  Aspirin 81 mg daily atorvastatin 40 mg daily Calcitrol 0.25 mcg daily Coreg 6.25 mg twice daily iron 325 mg twice daily.  Lasix 80 mg IV every 12 hours insulin sliding scale  PTH 171.  TSH 2.053  Objective:  Vital signs in last 24 hours:  Temp:  [97.8 F (36.6 C)-98.8 F (37.1 C)] 98.8 F (37.1 C) (09/04 0805) Pulse Rate:  [67-88] 75 (09/04 0734) Resp:  [18-20] 20 (09/04 0805) BP: (122-161)/(55-75) 142/71 (09/04 0734) SpO2:  [98 %] 98 % (09/04 0414) Weight:  [60.3 kg] 60.3 kg (09/04 0414)  Weight change: -0.074 kg Filed Weights   06/14/20 0452 06/15/20 0500 06/16/20 0414  Weight: 63 kg 60.4 kg 60.3 kg    Intake/Output: I/O last 3 completed shifts: In: 9562 [P.O.:1405; I.V.:3] Out: 4625 [Urine:4625]   Intake/Output this shift:  Total I/O In: 150 [P.O.:150] Out: 300 [Urine:300]  CVS- RRR no murmurs rubs or gallops RS- CTA no wheeze or rales ABD- BS present soft non-distended EXT- trace edema   Basic Metabolic Panel: Recent Labs  Lab 06/11/20 0127 06/11/20 0127 06/12/20 0327 06/12/20 0327 06/13/20 0426 06/14/20 0345 06/15/20 1438  NA 142  --  142  --  141 142 144  K 5.0  --  4.5  --  4.2 4.4 4.4  CL 105  --  104  --  101 100 96*  CO2 28  --  30  --  31 33* 38*  GLUCOSE 172*  --   149*  --  96 180* 168*  BUN 43*  --  47*  --  53* 54* 53*  CREATININE 2.70*  --  3.01*  --  3.08* 3.36* 2.99*  CALCIUM 8.6*   < > 8.4*   < > 8.4* 8.7* 8.7*   < > = values in this interval not displayed.    Liver Function Tests: Recent Labs  Lab 06/09/20 1947  AST 12*  ALT 16  ALKPHOS 63  BILITOT 0.6  PROT 5.8*  ALBUMIN 3.4*   No results for input(s): LIPASE, AMYLASE in the last 168 hours. No results for input(s): AMMONIA in the last 168 hours.  CBC: Recent Labs  Lab 06/09/20 1947 06/11/20 0127 06/14/20 0345  WBC 6.2 4.9 5.0  NEUTROABS  --  3.7  --   HGB 10.7* 9.6* 10.8*  HCT 35.5* 30.9* 34.1*  MCV 95.2 93.4 93.4  PLT 190 165 169    Cardiac Enzymes: No results for input(s): CKTOTAL, CKMB, CKMBINDEX, TROPONINI in the last 168 hours.  BNP: Invalid input(s): POCBNP  CBG: Recent Labs  Lab 06/14/20 2214 06/15/20 1128 06/15/20 1634 06/15/20 2046 06/16/20 0724  GLUCAP 195* 144* 184* 178* 133*    Microbiology: Results for orders placed or performed during the hospital encounter of 06/09/20  SARS Coronavirus 2 by RT PCR (hospital order, performed in [facility] hospital lab) Nasopharyngeal  Nasopharyngeal Swab     Status: None   Collection Time: 06/10/20 11:34 AM   Specimen: Nasopharyngeal Swab  Result Value Ref Range Status   SARS Coronavirus 2 NEGATIVE NEGATIVE Final    Comment: (NOTE) SARS-CoV-2 target nucleic acids are NOT DETECTED.  The SARS-CoV-2 RNA is generally detectable in upper and lower respiratory specimens during the acute phase of infection. The lowest concentration of SARS-CoV-2 viral copies this assay can detect is 250 copies / mL. A negative result does not preclude SARS-CoV-2 infection and should not be used as the sole basis for treatment or other patient management decisions.  A negative result may occur with improper specimen collection / handling, submission of specimen other than nasopharyngeal swab, presence of viral mutation(s)  within the areas targeted by this assay, and inadequate number of viral copies (<250 copies / mL). A negative result must be combined with clinical observations, patient history, and epidemiological information.  Fact Sheet for Patients:   StrictlyIdeas.no  Fact Sheet for Healthcare Providers: BankingDealers.co.za  This test is not yet approved or  cleared by the [place] FDA and has been authorized for detection and/or diagnosis of SARS-CoV-2 by FDA under an Emergency Use Authorization (EUA).  This EUA will remain in effect (meaning this test can be used) for the duration of the COVID-19 declaration under Section 564(b)(1) of the Act, 21 U.S.C. section 360bbb-3(b)(1), unless the authorization is terminated or revoked sooner.  Performed at [facility], [place] [address], [place], [place] [address]     Coagulation Studies: No results for input(s): LABPROT, INR in the last 72 hours.  Urinalysis: No results for input(s): COLORURINE, LABSPEC, PHURINE, GLUCOSEU, HGBUR, BILIRUBINUR, KETONESUR, PROTEINUR, UROBILINOGEN, NITRITE, LEUKOCYTESUR in the last 72 hours.  Invalid input(s): APPERANCEUR    Imaging: No results found.   Medications:   . sodium chloride     . aspirin EC  81 mg Oral Daily  . atorvastatin  40 mg Oral Daily  . calcitRIOL  0.25 mcg Oral Daily  . carvedilol  6.25 mg Oral BID WC  . enoxaparin (LOVENOX) injection  30 mg Subcutaneous Q24H  . ferrous sulfate  325 mg Oral BID WC  . furosemide  80 mg Oral Daily  . insulin aspart  0-15 Units Subcutaneous TID WC  . insulin aspart  0-5 Units Subcutaneous QHS  . sodium chloride flush  3 mL Intravenous Q12H   sodium chloride, hydrALAZINE, sodium chloride flush  Assessment/ Plan:  1.Renal-acute kidney injury in the setting of worsening congestive heart failure and hypertensive crisis.  Renal ultrasound showed no evidence of hydronephrosis.  Urine studies  pending.  I believe this may represent some acute tubular necrosis secondary to her severe hypertension.  Creatinine improved 2. Hypertension/volume  -mild volume overload I would continue IV diuretics.  Transition to oral Lasix 80 mg daily 3.  Anemia T sats 20%.  Darbepoetin 25 mcg given 06/12/2020 4.  Bones will check calcium phosphorus.  PTH elevated at 171.   calcitriol 0.25 mcg daily 5.  New diagnosis of congestive heart failure would recommend cardiology involvement.  Serum protein electrophoresis appears to be within normal range 6.  Appears to have abnormalities with a CO2 increased to 38.  There is no additional bicarbonate being used.  Will check urine sodium and ABG    LOS: 6 Sherril Croon @TODAY @8 :38 AM

## 2020-06-17 LAB — BASIC METABOLIC PANEL
Anion gap: 7 (ref 5–15)
BUN: 55 mg/dL — ABNORMAL HIGH (ref 8–23)
CO2: 37 mmol/L — ABNORMAL HIGH (ref 22–32)
Calcium: 9 mg/dL (ref 8.9–10.3)
Chloride: 99 mmol/L (ref 98–111)
Creatinine, Ser: 3.12 mg/dL — ABNORMAL HIGH (ref 0.44–1.00)
GFR calc Af Amer: 17 mL/min — ABNORMAL LOW (ref >60)
GFR calc non Af Amer: 15 mL/min — ABNORMAL LOW (ref >60)
Glucose, Bld: 167 mg/dL — ABNORMAL HIGH (ref 70–99)
Potassium: 4.5 mmol/L (ref 3.5–5.1)
Sodium: 143 mmol/L (ref 135–145)

## 2020-06-17 LAB — GLUCOSE, CAPILLARY
Glucose-Capillary: 151 mg/dL — ABNORMAL HIGH (ref 70–99)
Glucose-Capillary: 195 mg/dL — ABNORMAL HIGH (ref 70–99)
Glucose-Capillary: 105 mg/dL — ABNORMAL HIGH (ref 70–99)
Glucose-Capillary: 223 mg/dL — ABNORMAL HIGH (ref 70–99)

## 2020-06-17 NOTE — Progress Notes (Signed)
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 Edema has been progressive for 2-3 months but worsened about 1 month ago. Some SOB with exertion. No cough. No orthopnea. No PND. No chest pain. She has been out of her medications for a year or two -Lost job a couple of months ago and couldn't afford to go to the doctor, doctor wouldn't refill meds.  ED Course:2-3+ LE pitting edema, pulmonary edema on CXR. AKI (creatinine 2.54).     Nephrology consulted and following.  Appreciate assistance.    06/17/20: Seen and examined at her bedside.  She has no new complaints.  Good diuresing -15.9 L.  Creatinine uptrending 3.1 from 2.7.  Assessment/Plan: Principal Problem:   Anasarca Active Problems:   Essential hypertension   Type 2 diabetes mellitus with hyperlipidemia (HCC)   CAD (coronary artery disease)   AKI (acute kidney injury) ([place])   Dyslipidemia  Anasarca suspect multifactorial secondary to acute renal failure and acute systolic CHF. Patient presenting with worseningedema/anasarca extending all the way up to her upper thighs; also with DOE. CXR consistent with pulmonary edema. BNP is elevated > 3700. Echocardiogram 06/10/20 had demonstrated EF 45-50%. Global hypokinesis.Grade II diastolic dysfunction. Cardiology has been consulted and CHF order set utilized. Volume overload on exam, improving. Ongoing diuresing with p.o. Lasix 80 mg daily.  Appreciate nephrology's assistance  Elevated troponins, likely demand ischemia:  Troponin peaked at 32 Denies any anginal symptoms at the time of  this visit. Cardiology consulted  Nonoliguric AKI:  Per medical record Baseline creatinine appears to be 1.0 with GFR greater than 60. Presented with creatinine of 2.5 with GFR of 22  Hyaline casts on urinalysis Nephrology following, appreciate assistance. Creatinine 3.1 from 2.7 from 3.36. Good urine output. Continue to avoid nephrotoxins Daily renal panel Care directed by nephrology  Acute systolic and diastolic CHF Presented with elevated BNP, bilateral lower extremity edema 2D echo done on 06/10/2020 showing LVEF 45 to 50% and type II diastolic dysfunction Continue cardiac medications Diuretics per nephrology Monitor strict I's and O's and daily weight Net I&O -14.2 L  Essential HTN:  BP stable Continue home Coreg, p.o. Lasix Continue to monitor vital signs.  HLD:  ContinueLipitor 40 mg daily.  Lipid panel reveals an LDL of 157.  DM II with hyperglycemia: Hemoglobin A1c of 8.2. Continue insulin sliding scale, sensitive.  CAD: s/p CABG.  Denies any anginal symptoms Continue ASA daily, Coreg, and Lipitor   DVT prophylaxis:Lovenox subcu daily Code Status:Full Family Communication:None at bedside   Consultants:  Nephrology   Disposition Plan:     Status is: Inpatient   Dispo: The patient is from: Home.              Anticipated d/c is to: Home.              Anticipated d/c date is: 06/18/2020               Patient currently not stable for discharge due to ongoing management of AKI and anasarca.       Objective: Vitals:   06/16/20 1942 06/17/20 0004  06/17/20 0604 06/17/20 0749  BP: 136/65 132/66 (!) 141/64 (!) 132/59  Pulse: 75 85 74 71  Resp: 18 18 17 20   Temp: 98 F (36.7 C) 98 F (36.7 C) 98 F (36.7 C) 98.3 F (36.8 C)  TempSrc: Oral Oral Oral Oral  SpO2: 96% 99% 99%   Weight:   60.2 kg   Height:        Intake/Output Summary (Last 24 hours) at 06/17/2020 1406 Last data filed at 06/17/2020 1200 Gross per 24 hour  Intake 81 ml    Output 1775 ml  Net -1694 ml   Filed Weights   06/15/20 0500 06/16/20 0414 06/17/20 0604  Weight: 60.4 kg 60.3 kg 60.2 kg    Exam:  . General: 64 y.o. year-old female pleasant well-developed well-nourished no acute distress.  Alert oriented x3. . Cardiovascular: Regular rate and rhythm no rubs or gallops.  No JVD autonomically noted. Marland Kitchen Respiratory: Clear to auscultation no wheezes or rales.   . Abdomen: Soft nontender normal bowel sounds present. . Musculoskeletal: Lower extremity edema improved.   Marland Kitchen Psychiatry: Mood is appropriate for condition and setting.  Data Reviewed: CBC: Recent Labs  Lab 06/11/20 0127 06/14/20 0345  WBC 4.9 5.0  NEUTROABS 3.7  --   HGB 9.6* 10.8*  HCT 30.9* 34.1*  MCV 93.4 93.4  PLT 165 937   Basic Metabolic Panel: Recent Labs  Lab 06/13/20 0426 06/14/20 0345 06/15/20 1438 06/16/20 0926 06/17/20 0635  NA 141 142 144 143 143  K 4.2 4.4 4.4 4.6 4.5  CL 101 100 96* 101 99  CO2 31 33* 38* 33* 37*  GLUCOSE 96 180* 168* 180* 167*  BUN 53* 54* 53* 51* 55*  CREATININE 3.08* 3.36* 2.99* 2.71* 3.12*  CALCIUM 8.4* 8.7* 8.7* 8.8* 9.0   GFR: Estimated Creatinine Clearance: 15.5 mL/min (A) (by C-G formula based on SCr of 3.12 mg/dL (H)). Liver Function Tests: No results for input(s): AST, ALT, ALKPHOS, BILITOT, PROT, ALBUMIN in the last 168 hours. No results for input(s): LIPASE, AMYLASE in the last 168 hours. No results for input(s): AMMONIA in the last 168 hours. Coagulation Profile: No results for input(s): INR, PROTIME in the last 168 hours. Cardiac Enzymes: No results for input(s): CKTOTAL, CKMB, CKMBINDEX, TROPONINI in the last 168 hours. BNP (last 3 results) No results for input(s): PROBNP in the last 8760 hours. HbA1C: No results for input(s): HGBA1C in the last 72 hours. CBG: Recent Labs  Lab 06/16/20 1144 06/16/20 1654 06/16/20 2040 06/17/20 0650 06/17/20 1219  GLUCAP 190* 122* 169* 151* 195*   Lipid Profile: No results  for input(s): CHOL, HDL, LDLCALC, TRIG, CHOLHDL, LDLDIRECT in the last 72 hours. Thyroid Function Tests: No results for input(s): TSH, T4TOTAL, FREET4, T3FREE, THYROIDAB in the last 72 hours. Anemia Panel: No results for input(s): VITAMINB12, FOLATE, FERRITIN, TIBC, IRON, RETICCTPCT in the last 72 hours. Urine analysis:    Component Value Date/Time   COLORURINE YELLOW 06/12/2020 0916   APPEARANCEUR CLEAR 06/12/2020 0916   LABSPEC 1.009 06/12/2020 0916   PHURINE 6.0 06/12/2020 0916   GLUCOSEU NEGATIVE 06/12/2020 0916   HGBUR SMALL (A) 06/12/2020 0916   BILIRUBINUR NEGATIVE 06/12/2020 0916   KETONESUR NEGATIVE 06/12/2020 0916   PROTEINUR 100 (A) 06/12/2020 0916   UROBILINOGEN 0.2 10/20/2017 1115   NITRITE NEGATIVE 06/12/2020 0916   LEUKOCYTESUR SMALL (A) 06/12/2020 0916   Sepsis Labs: @LABRCNTIP (procalcitonin:4,lacticidven:4)  ) Recent Results (from the past 240 hour(s))  SARS Coronavirus 2 by RT PCR (hospital order,  performed in [facility] hospital lab) Nasopharyngeal Nasopharyngeal Swab     Status: None   Collection Time: 06/10/20 11:34 AM   Specimen: Nasopharyngeal Swab  Result Value Ref Range Status   SARS Coronavirus 2 NEGATIVE NEGATIVE Final    Comment: (NOTE) SARS-CoV-2 target nucleic acids are NOT DETECTED.  The SARS-CoV-2 RNA is generally detectable in upper and lower respiratory specimens during the acute phase of infection. The lowest concentration of SARS-CoV-2 viral copies this assay can detect is 250 copies / mL. A negative result does not preclude SARS-CoV-2 infection and should not be used as the sole basis for treatment or other patient management decisions.  A negative result may occur with improper specimen collection / handling, submission of specimen other than nasopharyngeal swab, presence of viral mutation(s) within the areas targeted by this assay, and inadequate number of viral copies (<250 copies / mL). A negative result must be combined with  clinical observations, patient history, and epidemiological information.  Fact Sheet for Patients:   StrictlyIdeas.no  Fact Sheet for Healthcare Providers: BankingDealers.co.za  This test is not yet approved or  cleared by the [place] FDA and has been authorized for detection and/or diagnosis of SARS-CoV-2 by FDA under an Emergency Use Authorization (EUA).  This EUA will remain in effect (meaning this test can be used) for the duration of the COVID-19 declaration under Section 564(b)(1) of the Act, 21 U.S.C. section 360bbb-3(b)(1), unless the authorization is terminated or revoked sooner.  Performed at [facility], [place] [address], [place], [place] [address]       Studies: No results found.  Scheduled Meds: . aspirin EC  81 mg Oral Daily  . atorvastatin  40 mg Oral Daily  . calcitRIOL  0.25 mcg Oral Daily  . carvedilol  6.25 mg Oral BID WC  . enoxaparin (LOVENOX) injection  30 mg Subcutaneous Q24H  . ferrous sulfate  325 mg Oral BID WC  . furosemide  80 mg Oral Daily  . insulin aspart  0-15 Units Subcutaneous TID WC  . insulin aspart  0-5 Units Subcutaneous QHS  . sodium chloride flush  3 mL Intravenous Q12H    Continuous Infusions: . sodium chloride       LOS: 7 days     Kayleen Memos, MD [facility] Pager [phone]  If 7PM-7AM, please contact night-coverage [email or web address] Password [email or web address] 06/17/2020, 2:06 PM

## 2020-06-17 NOTE — Progress Notes (Signed)
[facility] ROUNDING NOTE   Subjective:   Brief history: 64 year old lady hypertension hyperlipidemia diabetes status post CABG.  She was admitted with volume overload and 2D echo demonstrating 45-50% ejection fraction with decreased global hypokinesis.  Serum creatinine on admission was 2.54 mg/dL increase slightly .  Previous creatinine 1.09 11/04/2017.  Renal ultrasound 06/10/2020 showed mild increase in bilateral renal echogenicity.  Serum protein electrophoresis s  negative M spike  Blood pressure 132/59 pulse 71 temperature 98 O2 sats 99% room air   urine output 1.9 L 06/16/2020    Sodium 143 potassium 4.5 chloride 99 CO2 37 BUN 35 creatinine 3.12 glucose 167 calcium 9.0  Aspirin 81 mg daily atorvastatin 40 mg daily Calcitrol 0.25 mcg daily Coreg 6.25 mg twice daily iron 325 mg twice daily.    insulin sliding scale  PTH 171.  TSH 2.053  Objective:  Vital signs in last 24 hours:  Temp:  [98 F (36.7 C)-98.8 F (37.1 C)] 98 F (36.7 C) (09/05 0604) Pulse Rate:  [68-85] 71 (09/05 0749) Resp:  [14-20] 17 (09/05 0604) BP: (132-149)/(55-66) 132/59 (09/05 0749) SpO2:  [93 %-99 %] 99 % (09/05 0604) Weight:  [60.2 kg] 60.2 kg (09/05 0604)  Weight change: -0.05 kg Filed Weights   06/15/20 0500 06/16/20 0414 06/17/20 0604  Weight: 60.4 kg 60.3 kg 60.2 kg    Intake/Output: I/O last 3 completed shifts: In: 566 [P.O.:560; I.V.:6] Out: 2475 [Urine:2475]   Intake/Output this shift:  No intake/output data recorded.  CVS- RRR no murmurs rubs or gallops RS- CTA no wheeze or rales ABD- BS present soft non-distended EXT- trace edema   Basic Metabolic Panel: Recent Labs  Lab 06/13/20 0426 06/13/20 0426 06/14/20 0345 06/14/20 0345 06/15/20 1438 06/16/20 0926 06/17/20 0635  NA 141  --  142  --  144 143 143  K 4.2  --  4.4  --  4.4 4.6 4.5  CL 101  --  100  --  96* 101 99  CO2 31  --  33*  --  38* 33* 37*  GLUCOSE 96  --  180*  --  168* 180* 167*  BUN 53*  --   54*  --  53* 51* 55*  CREATININE 3.08*  --  3.36*  --  2.99* 2.71* 3.12*  CALCIUM 8.4*   < > 8.7*   < > 8.7* 8.8* 9.0   < > = values in this interval not displayed.    Liver Function Tests: No results for input(s): AST, ALT, ALKPHOS, BILITOT, PROT, ALBUMIN in the last 168 hours. No results for input(s): LIPASE, AMYLASE in the last 168 hours. No results for input(s): AMMONIA in the last 168 hours.  CBC: Recent Labs  Lab 06/11/20 0127 06/14/20 0345  WBC 4.9 5.0  NEUTROABS 3.7  --   HGB 9.6* 10.8*  HCT 30.9* 34.1*  MCV 93.4 93.4  PLT 165 169    Cardiac Enzymes: No results for input(s): CKTOTAL, CKMB, CKMBINDEX, TROPONINI in the last 168 hours.  BNP: Invalid input(s): POCBNP  CBG: Recent Labs  Lab 06/16/20 0724 06/16/20 1144 06/16/20 1654 06/16/20 2040 06/17/20 0650  GLUCAP 133* 190* 122* 169* 151*    Microbiology: Results for orders placed or performed during the hospital encounter of 06/09/20  SARS Coronavirus 2 by RT PCR (hospital order, performed in [facility] hospital lab) Nasopharyngeal Nasopharyngeal Swab     Status: None   Collection Time: 06/10/20 11:34 AM   Specimen: Nasopharyngeal Swab  Result Value Ref  Range Status   SARS Coronavirus 2 NEGATIVE NEGATIVE Final    Comment: (NOTE) SARS-CoV-2 target nucleic acids are NOT DETECTED.  The SARS-CoV-2 RNA is generally detectable in upper and lower respiratory specimens during the acute phase of infection. The lowest concentration of SARS-CoV-2 viral copies this assay can detect is 250 copies / mL. A negative result does not preclude SARS-CoV-2 infection and should not be used as the sole basis for treatment or other patient management decisions.  A negative result may occur with improper specimen collection / handling, submission of specimen other than nasopharyngeal swab, presence of viral mutation(s) within the areas targeted by this assay, and inadequate number of viral copies (<250 copies / mL). A  negative result must be combined with clinical observations, patient history, and epidemiological information.  Fact Sheet for Patients:   StrictlyIdeas.no  Fact Sheet for Healthcare Providers: BankingDealers.co.za  This test is not yet approved or  cleared by the [place] FDA and has been authorized for detection and/or diagnosis of SARS-CoV-2 by FDA under an Emergency Use Authorization (EUA).  This EUA will remain in effect (meaning this test can be used) for the duration of the COVID-19 declaration under Section 564(b)(1) of the Act, 21 U.S.C. section 360bbb-3(b)(1), unless the authorization is terminated or revoked sooner.  Performed at [facility], [place] [address], [place], [place] [address]     Coagulation Studies: No results for input(s): LABPROT, INR in the last 72 hours.  Urinalysis: No results for input(s): COLORURINE, LABSPEC, PHURINE, GLUCOSEU, HGBUR, BILIRUBINUR, KETONESUR, PROTEINUR, UROBILINOGEN, NITRITE, LEUKOCYTESUR in the last 72 hours.  Invalid input(s): APPERANCEUR    Imaging: No results found.   Medications:   . sodium chloride     . aspirin EC  81 mg Oral Daily  . atorvastatin  40 mg Oral Daily  . calcitRIOL  0.25 mcg Oral Daily  . carvedilol  6.25 mg Oral BID WC  . enoxaparin (LOVENOX) injection  30 mg Subcutaneous Q24H  . ferrous sulfate  325 mg Oral BID WC  . furosemide  80 mg Oral Daily  . insulin aspart  0-15 Units Subcutaneous TID WC  . insulin aspart  0-5 Units Subcutaneous QHS  . sodium chloride flush  3 mL Intravenous Q12H   sodium chloride, hydrALAZINE, sodium chloride flush  Assessment/ Plan:  1.Renal-acute kidney injury in the setting of worsening congestive heart failure and hypertensive crisis.  Renal ultrasound showed no evidence of hydronephrosis.  Urine studies pending.  I believe this may represent some acute tubular necrosis secondary to her severe hypertension.   Creatinine improved. 2. Hypertension/volume  -resolved volume overload transition to 80 mg Lasix oral once daily 3.  Anemia T sats 20%.  Darbepoetin 25 mcg given 06/12/2020 4.  Bones will check calcium phosphorus.  PTH elevated at 171.   calcitriol 0.25 mcg daily 5.  New diagnosis of congestive heart failure would recommend cardiology involvement.  Serum protein electrophoresis appears to be within normal range 6.  Appears to have abnormalities with a CO2 increased to 38.  There is no additional bicarbonate being used.  ABG shows pH 7.4 with elevated CO2 of 56.  This is consistent with a mixed picture of metabolic alkalosis and respiratory acidosis.  Urine sodium of 114.  This would not suggest a contraction alkalosis.  However patient is taking oral Lasix which may confound this number.  At this point there is nothing to add from a renal standpoint I will sign off on patient.  She will need follow-up at [facility] next 3 to 4 weeks.    LOS: [place] @TODAY @7 :53 AM

## 2020-06-18 ENCOUNTER — Inpatient Hospital Stay (HOSPITAL_COMMUNITY): Payer: 59

## 2020-06-18 ENCOUNTER — Encounter (HOSPITAL_COMMUNITY): Payer: 59

## 2020-06-18 DIAGNOSIS — I160 Hypertensive urgency: Secondary | ICD-10-CM

## 2020-06-18 DIAGNOSIS — I5033 Acute on chronic diastolic (congestive) heart failure: Secondary | ICD-10-CM

## 2020-06-18 LAB — BASIC METABOLIC PANEL
Anion gap: 9 (ref 5–15)
BUN: 58 mg/dL — ABNORMAL HIGH (ref 8–23)
CO2: 32 mmol/L (ref 22–32)
Calcium: 8.6 mg/dL — ABNORMAL LOW (ref 8.9–10.3)
Chloride: 97 mmol/L — ABNORMAL LOW (ref 98–111)
Creatinine, Ser: 3.18 mg/dL — ABNORMAL HIGH (ref 0.44–1.00)
GFR calc Af Amer: 17 mL/min — ABNORMAL LOW (ref >60)
GFR calc non Af Amer: 15 mL/min — ABNORMAL LOW (ref >60)
Glucose, Bld: 135 mg/dL — ABNORMAL HIGH (ref 70–99)
Potassium: 4.7 mmol/L (ref 3.5–5.1)
Sodium: 138 mmol/L (ref 135–145)

## 2020-06-18 LAB — GLUCOSE, CAPILLARY
Glucose-Capillary: 118 mg/dL — ABNORMAL HIGH (ref 70–99)
Glucose-Capillary: 202 mg/dL — ABNORMAL HIGH (ref 70–99)
Glucose-Capillary: 121 mg/dL — ABNORMAL HIGH (ref 70–99)
Glucose-Capillary: 175 mg/dL — ABNORMAL HIGH (ref 70–99)

## 2020-06-18 MED ORDER — CARVEDILOL 6.25 MG PO TABS: 6.2500 mg | ORAL_TABLET | Freq: Two times a day (BID) | ORAL | 0 refills | Status: DC

## 2020-06-18 MED ORDER — ATORVASTATIN CALCIUM 40 MG PO TABS: 40.0000 mg | ORAL_TABLET | Freq: Every day | ORAL | 0 refills | Status: DC

## 2020-06-18 MED ORDER — GLIMEPIRIDE 1 MG PO TABS
1.0000 mg | ORAL_TABLET | Freq: Every day | ORAL | Status: DC
  Administered 2020-06-19: 1 mg via ORAL
  Filled 2020-06-18: qty 1

## 2020-06-18 MED ORDER — ASPIRIN 81 MG PO TBEC: 81.0000 mg | DELAYED_RELEASE_TABLET | Freq: Every day | ORAL | 0 refills | Status: AC

## 2020-06-18 MED ORDER — FUROSEMIDE 80 MG PO TABS: 80.0000 mg | ORAL_TABLET | Freq: Every day | ORAL | 0 refills | Status: DC

## 2020-06-18 MED ORDER — CALCITRIOL 0.25 MCG PO CAPS: 0.2500 ug | ORAL_CAPSULE | Freq: Every day | ORAL | 0 refills | Status: DC

## 2020-06-18 MED ORDER — FERROUS SULFATE 325 (65 FE) MG PO TABS: 325.0000 mg | ORAL_TABLET | Freq: Two times a day (BID) | ORAL | 0 refills | Status: DC

## 2020-06-18 NOTE — Progress Notes (Signed)
Carotid duplex bilateral study completed.   Please see CV Proc for preliminary results.   Beverly Martin

## 2020-06-18 NOTE — Progress Notes (Signed)
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 Edema has been progressive for 2-3 months but worsened about 1 month ago. Associated SOB with exertion. No cough. No orthopnea. No PND. No chest pain. She has been out of her medications for a year or two.  ED Course:3+ bilateral LE pitting edema up to thighs, pulmonary edema on CXR. AKI with presenting creatinine 2.54.     Nephrology consulted and signed off 06/17/2020.  Cardiology consulted on 06/18/2020 due to worsening LVEF and global hypokinesis on 2D echo  06/18/20: Seen and examined at her bedside.  She has no new complaints.  Good diuresing -16.8 L.  Creatinine uptrending 3.1 from 2.7.  She denies any chest pain or dyspnea at the time of this visit.  She does not follow-up with her cardiologist outpatient.  Assessment/Plan: Principal Problem:   Anasarca Active Problems:   Essential hypertension   Type 2 diabetes mellitus with hyperlipidemia (HCC)   CAD (coronary artery disease)   AKI (acute kidney injury) ([place])   Dyslipidemia  Anasarca improving, suspect multifactorial secondary to acute renal failure and acute systolic CHF. Patient presenting with worseningedema/anasarca extending all the way up to her upper thighs; also with DOE. CXR consistent with pulmonary edema. BNP is elevated > 3700. Echocardiogram 06/10/20 had demonstrated EF 45-50%. Global hypokinesis.Grade II diastolic dysfunction. Cardiology has been consulted and CHF order set utilized. Volume overload on exam, improving. Ongoing diuresing with p.o. Lasix 80 mg daily.  Appreciate nephrology's assistance  Elevated troponins, likely demand ischemia:    Troponin peaked at 32 Denies any anginal symptoms at the time of this visit. Cardiology consulted  Nonoliguric AKI:  Per medical record Baseline creatinine appears to be 1.0 with GFR greater than 60. Presented with creatinine of 2.5 with GFR of 22  Hyaline casts on urinalysis Nephrology followed, signed off on 06/17/2020.. Creatinine 3.1 from 2.7 from 3.36. Good urine output. Continue to avoid nephrotoxins Daily renal panel Care directed by nephrology  Acute systolic and diastolic CHF Presented with elevated BNP, bilateral lower extremity edema 2D echo done on 06/10/2020 showing LVEF 45 to 50% and type II diastolic dysfunction Continue cardiac medications Diuretics per nephrology Monitor strict I's and O's and daily weight Net I&O -16.8 L P.o. Lasix on hold due to AKI  Essential HTN:  BP stable Continue home Coreg, p.o. Lasix Continue to monitor vital signs.  HLD:  ContinueLipitor 40 mg daily.  Lipid panel reveals an LDL of 157.  DM II with hyperglycemia: Hemoglobin A1c of 8.2. Continue insulin sliding scale, sensitive.  CAD: s/p CABG.  Denies any anginal symptoms Continue ASA daily, Coreg, and Lipitor   DVT prophylaxis:Lovenox subcu daily Code Status:Full Family Communication:None at bedside   Consultants:  Nephrology   Disposition Plan:     Status is: Inpatient   Dispo: The patient is from: Home.              Anticipated d/c is to: Home.              Anticipated d/c date is: 06/19/2020               Patient currently not stable for discharge due to ongoing management of  AKI, acute on chronic combined diastolic CHF, and anasarca.       Objective: Vitals:   06/17/20 1705 06/17/20 1931 06/18/20 0408 06/18/20 0818  BP: (!) 159/70 140/66 (!) 151/72 (!) 138/58  Pulse: 67 80 74 73  Resp:  18 18   Temp:  98.3 F (36.8 C) 98.7 F (37.1 C)   TempSrc:  Oral Oral   SpO2:  99% 98%   Weight:   60.1 kg   Height:        Intake/Output Summary  (Last 24 hours) at 06/18/2020 1505 Last data filed at 06/17/2020 2300 Gross per 24 hour  Intake 148 ml  Output 1100 ml  Net -952 ml   Filed Weights   06/16/20 0414 06/17/20 0604 06/18/20 0408  Weight: 60.3 kg 60.2 kg 60.1 kg    Exam:  . General: 64 y.o. year-old female pleasant in no acute distress.  Alert and oriented x3.  . Cardiovascular: Regular rate and rhythm no rubs or gallops.   Marland Kitchen Respiratory: Clear to auscultation no wheezes no rales.   . Abdomen: Soft nontender normal bowel sounds present.   . Musculoskeletal: Trace lower extremity edema bilaterally.   Marland Kitchen Psychiatry: Mood is appropriate for condition and setting.  Data Reviewed: CBC: Recent Labs  Lab 06/14/20 0345  WBC 5.0  HGB 10.8*  HCT 34.1*  MCV 93.4  PLT 001   Basic Metabolic Panel: Recent Labs  Lab 06/14/20 0345 06/15/20 1438 06/16/20 0926 06/17/20 0635 06/18/20 0304  NA 142 144 143 143 138  K 4.4 4.4 4.6 4.5 4.7  CL 100 96* 101 99 97*  CO2 33* 38* 33* 37* 32  GLUCOSE 180* 168* 180* 167* 135*  BUN 54* 53* 51* 55* 58*  CREATININE 3.36* 2.99* 2.71* 3.12* 3.18*  CALCIUM 8.7* 8.7* 8.8* 9.0 8.6*   GFR: Estimated Creatinine Clearance: 15.2 mL/min (A) (by C-G formula based on SCr of 3.18 mg/dL (H)). Liver Function Tests: No results for input(s): AST, ALT, ALKPHOS, BILITOT, PROT, ALBUMIN in the last 168 hours. No results for input(s): LIPASE, AMYLASE in the last 168 hours. No results for input(s): AMMONIA in the last 168 hours. Coagulation Profile: No results for input(s): INR, PROTIME in the last 168 hours. Cardiac Enzymes: No results for input(s): CKTOTAL, CKMB, CKMBINDEX, TROPONINI in the last 168 hours. BNP (last 3 results) No results for input(s): PROBNP in the last 8760 hours. HbA1C: No results for input(s): HGBA1C in the last 72 hours. CBG: Recent Labs  Lab 06/17/20 1219 06/17/20 1640 06/17/20 2027 06/18/20 0647 06/18/20 1116  GLUCAP 195* 105* 223* 118* 202*   Lipid Profile: No  results for input(s): CHOL, HDL, LDLCALC, TRIG, CHOLHDL, LDLDIRECT in the last 72 hours. Thyroid Function Tests: No results for input(s): TSH, T4TOTAL, FREET4, T3FREE, THYROIDAB in the last 72 hours. Anemia Panel: No results for input(s): VITAMINB12, FOLATE, FERRITIN, TIBC, IRON, RETICCTPCT in the last 72 hours. Urine analysis:    Component Value Date/Time   COLORURINE YELLOW 06/12/2020 0916   APPEARANCEUR CLEAR 06/12/2020 0916   LABSPEC 1.009 06/12/2020 0916   PHURINE 6.0 06/12/2020 0916   GLUCOSEU NEGATIVE 06/12/2020 0916   HGBUR SMALL (A) 06/12/2020 0916   BILIRUBINUR NEGATIVE 06/12/2020 0916   KETONESUR NEGATIVE 06/12/2020 0916   PROTEINUR 100 (A) 06/12/2020 0916   UROBILINOGEN 0.2 10/20/2017 1115   NITRITE NEGATIVE 06/12/2020 0916   LEUKOCYTESUR SMALL (A) 06/12/2020 0916   Sepsis Labs: @LABRCNTIP (procalcitonin:4,lacticidven:4)  ) Recent Results (from the past 240 hour(s))  SARS Coronavirus 2  by RT PCR (hospital order, performed in [facility] hospital lab) Nasopharyngeal Nasopharyngeal Swab     Status: None   Collection Time: 06/10/20 11:34 AM   Specimen: Nasopharyngeal Swab  Result Value Ref Range Status   SARS Coronavirus 2 NEGATIVE NEGATIVE Final    Comment: (NOTE) SARS-CoV-2 target nucleic acids are NOT DETECTED.  The SARS-CoV-2 RNA is generally detectable in upper and lower respiratory specimens during the acute phase of infection. The lowest concentration of SARS-CoV-2 viral copies this assay can detect is 250 copies / mL. A negative result does not preclude SARS-CoV-2 infection and should not be used as the sole basis for treatment or other patient management decisions.  A negative result may occur with improper specimen collection / handling, submission of specimen other than nasopharyngeal swab, presence of viral mutation(s) within the areas targeted by this assay, and inadequate number of viral copies (<250 copies / mL). A negative result must be combined  with clinical observations, patient history, and epidemiological information.  Fact Sheet for Patients:   StrictlyIdeas.no  Fact Sheet for Healthcare Providers: BankingDealers.co.za  This test is not yet approved or  cleared by the [place] FDA and has been authorized for detection and/or diagnosis of SARS-CoV-2 by FDA under an Emergency Use Authorization (EUA).  This EUA will remain in effect (meaning this test can be used) for the duration of the COVID-19 declaration under Section 564(b)(1) of the Act, 21 U.S.C. section 360bbb-3(b)(1), unless the authorization is terminated or revoked sooner.  Performed at [facility], [place] [address], [place], [place] [address]       Studies: No results found.  Scheduled Meds: . aspirin EC  81 mg Oral Daily  . atorvastatin  40 mg Oral Daily  . calcitRIOL  0.25 mcg Oral Daily  . carvedilol  6.25 mg Oral BID WC  . enoxaparin (LOVENOX) injection  30 mg Subcutaneous Q24H  . ferrous sulfate  325 mg Oral BID WC  . furosemide  80 mg Oral Daily  . insulin aspart  0-15 Units Subcutaneous TID WC  . insulin aspart  0-5 Units Subcutaneous QHS  . sodium chloride flush  3 mL Intravenous Q12H    Continuous Infusions: . sodium chloride       LOS: 8 days     Kayleen Memos, MD [facility] Pager [phone]  If 7PM-7AM, please contact night-coverage [email or web address] Password [email or web address] 06/18/2020, 3:05 PM

## 2020-06-18 NOTE — Consult Note (Addendum)
Cardiology Consultation:   Patient ID: Beverly Martin MRN: [id number]; DOB: [birth year]  Admit date: 06/09/2020 Date of Consult: 06/18/2020  Primary Care Provider: Billie Ruddy, MD [facility] Cardiologist:Dr. Nahser   Patient Profile:   Beverly Martin is a 64 y.o. female with a hx of CAD s/p CABG, HTN, HLD, chronic diastolic CHF, DM and CKD who is being seen today for the evaluation of CHF at the request of Dr. [place] Crane.   No regular cardiology follow up.  Seen by Dr. Acie Fredrickson when admitted 10/2017 for CHF in setting of urosepsis and hypertensive urgency.   History of Present Illness:   Beverly Martin presented August 28 with 82-months history of progressive worsening shortness of breath, orthopnea and lower extremity edema.  Patient was admitted for anasarca in setting of renal failure at creatinine of 2.54.  Previously baseline creatinine around 1 in 2019.  She was not taking her medication for at least 1 to 2 years.  She was hypertensive.  Checks x-ray consistent with pulmonary edema.  BNP was elevated greater than 3700.  Patient was seen by nephrologist and diuresed well.  Diuresed total -16 L.  Renal function improved and then gradually worsened.  Neurology signed off.  Blood pressure normalized.    Patient admits to eating low-sodium diet.  No regular exercise.  Still works full-time.  Occasional shortness of breath with activity.   Past Medical History:  Diagnosis Date   CAD (coronary artery disease)    Diabetes mellitus without complication ([place])    Essential hypertension    Hyperlipidemia     Past Surgical History:  Procedure Laterality Date   CARDIAC SURGERY     CABG     Inpatient Medications: Scheduled Meds:  aspirin EC  81 mg Oral Daily   atorvastatin  40 mg Oral Daily   calcitRIOL  0.25 mcg Oral Daily   carvedilol  6.25 mg Oral BID WC   enoxaparin (LOVENOX) injection  30 mg Subcutaneous Q24H   ferrous sulfate  325 mg Oral BID WC   furosemide  80 mg Oral Daily    insulin aspart  0-15 Units Subcutaneous TID WC   insulin aspart  0-5 Units Subcutaneous QHS   sodium chloride flush  3 mL Intravenous Q12H   Continuous Infusions:  sodium chloride     PRN Meds: sodium chloride, hydrALAZINE, sodium chloride flush  Allergies:   No Known Allergies  Social History:   Social History   Socioeconomic History   Marital status: Married    Spouse name: Not on file   Number of children: Not on file   Years of education: Not on file   Highest education level: Not on file  Occupational History   Not on file  Tobacco Use   Smoking status: Never Smoker   Smokeless tobacco: Never Used  Vaping Use   Vaping Use: Never used  Substance and Sexual Activity   Alcohol use: No   Drug use: No   Sexual activity: Never  Other Topics Concern   Not on file  Social History Narrative   Not on file   Social Determinants of Health   Financial Resource Strain:    Difficulty of Paying Living Expenses: Not on file  Food Insecurity:    Worried About Estate manager/land agent of Food in the Last Year: Not on file   [employer] of Food in the Last Year: Not on file  Transportation Needs:    Lack of Transportation (Medical): Not on file  Lack of Transportation (Non-Medical): Not on file  Physical Activity:    Days of Exercise per Week: Not on file   Minutes of Exercise per Session: Not on file  Stress:    Feeling of Stress : Not on file  Social Connections:    Frequency of Communication with Friends and Family: Not on file   Frequency of Social Gatherings with Friends and Family: Not on file   Attends Religious Services: Not on file   Active Member of Clubs or Organizations: Not on file   Attends Archivist Meetings: Not on file   Marital Status: Not on file  Intimate Partner Violence:    Fear of Current or Ex-Partner: Not on file   Emotionally Abused: Not on file   Physically Abused: Not on file   Sexually Abused: Not on file    Family History:   Family History    Problem Relation Age of Onset   Diabetes Mellitus II Mother    Esophageal cancer Father      ROS:  Please see the history of present illness.  All other ROS reviewed and negative.     Physical Exam/Data:   Vitals:   06/17/20 1703 06/17/20 1705 06/17/20 1931 06/18/20 0408  BP: (!) 159/70 (!) 159/70 140/66 (!) 151/72  Pulse: 67 67 80 74  Resp: 20  18 18   Temp:   98.3 F (36.8 C) 98.7 F (37.1 C)  TempSrc:   Oral Oral  SpO2:   99% 98%  Weight:    60.1 kg  Height:        Intake/Output Summary (Last 24 hours) at 06/18/2020 0820 Last data filed at 06/17/2020 2300 Gross per 24 hour  Intake 176 ml  Output 1550 ml  Net -1374 ml   Last 3 Weights 06/18/2020 06/17/2020 06/16/2020  Weight (lbs) 132 lb 7.9 oz 132 lb 13.2 oz 132 lb 15 oz  Weight (kg) 60.1 kg 60.25 kg 60.3 kg     Body mass index is 22.74 kg/m.  General:  Well nourished, well developed, in no acute distress HEENT: normal Lymph: no adenopathy Neck: no JVD Endocrine:  No thryomegaly Vascular: No carotid bruits; FA pulses 2+ bilaterally without bruits  Cardiac:  normal S1, S2; RRR; no murmur  Lungs:  clear to auscultation bilaterally, no wheezing, rhonchi or rales  Abd: soft, nontender, no hepatomegaly  Ext: no edema Musculoskeletal:  No deformities, BUE and BLE strength normal and equal Skin: warm and dry  Neuro:  CNs 2-12 intact, no focal abnormalities noted Psych:  Normal affect   EKG:  The EKG was personally reviewed and demonstrates: Sinus rhythm with T wave inversion inferior laterally with LVH Telemetry:  Telemetry was personally reviewed and demonstrates: Normal sinus rhythm  Relevant CV Studies:  Echo 06/10/20 1. Left ventricular ejection fraction, by estimation, is 45 to 50%. The  left ventricle has mildly decreased function. The left ventricle  demonstrates global hypokinesis. Left ventricular diastolic parameters are  consistent with Grade II diastolic  dysfunction (pseudonormalization).   2. Right  ventricular systolic function is normal. The right ventricular  size is normal.   3. The mitral valve is normal in structure. Mild mitral valve  regurgitation. No evidence of mitral stenosis.   4. The aortic valve is normal in structure. Aortic valve regurgitation is  not visualized. No aortic stenosis is present.   5. The inferior vena cava is normal in size with greater than 50%  respiratory variability, suggesting right atrial pressure of  3 mmHg  Laboratory Data:  High Sensitivity Troponin:   Recent Labs  Lab 06/10/20 0918 06/10/20 1556  TROPONINIHS 31* 32*     Chemistry Recent Labs  Lab 06/16/20 0926 06/17/20 0635 06/18/20 0304  NA 143 143 138  K 4.6 4.5 4.7  CL 101 99 97*  CO2 33* 37* 32  GLUCOSE 180* 167* 135*  BUN 51* 55* 58*  CREATININE 2.71* 3.12* 3.18*  CALCIUM 8.8* 9.0 8.6*  GFRNONAA 18* 15* 15*  GFRAA 21* 17* 17*  ANIONGAP 9 7 9     No results for input(s): PROT, ALBUMIN, AST, ALT, ALKPHOS, BILITOT in the last 168 hours. Hematology Recent Labs  Lab 06/14/20 0345  WBC 5.0  RBC 3.65*  HGB 10.8*  HCT 34.1*  MCV 93.4  MCH 29.6  MCHC 31.7  RDW 13.2  PLT 169    Radiology/Studies:  No results found. New York Heart Association (NYHA) Functional Class NYHA Class II  Assessment and Plan:   Acute on chronic diastolic heart failure - Presented with few months history of progressive worsening shortness of breath, orthopnea, PND and lower extremity edema in setting of hypertensive emergency, AKI and noncompliance with medications.  -BNP elevated.  Chest x-ray with pulmonary edema. -She diuresed greater than 16 L. -Breathing back to normal.  She appears euvolemic. -Continue current dose of Lasix. -Continue Coreg. -Recommended low-sodium diet. -Echocardiogram this admission showed LV function of 45 to 50% and grade 2 diastolic dysfunction.  Previously EF was 50 to 55% in 2019.  This could be normal variation between reader.  2.  Carotid artery  disease -Last Doppler done in 2005 -Order Doppler here.  If cannot done today this could be done as outpatient.  3.  Hypertensive urgency -Now resolved -Continue Coreg 6.25 mg twice daily -Can add hydralazine  4.  Acute on chronic renal failure -As above -Follow-up with nephrology  5.  CAD s/p CABG -Denies any chest discomfort. -Reports some shortness of breath with activity. -Continue aspirin and statin. -No inpatient ischemic work-up needed.  6.  Diabetes mellitus -Per primary team.   Will arrange follow up.  For questions or updates, please contact [place] Please consult [email or web address] for contact info under    Jarrett Soho, [place]  06/18/2020 8:20 AM  Patient examined chart reviewed Discussed care with patient and PA. She has had her COVID vaccine. Exam with black female in no distress Does not appear volume overloaded. Lungs clear. Bilateral carotid bruits. Post sternotomy no murmur JVP ok. Trace LE edema. Primary issue appears to be renovascular HTN and CRF. She has not had good medical f/u. Discussed with her at length need for regular medical f/u and BP control or she will be on dialysis . She is not a candidate for cath in regard to mildly reduced EF and previous CABG. She works at [employer] with no angina. Add hydralazine for BP She needs f/u carotid duplex as she has had previously significant disease and has bilateral carotid bruits on exam Will arrange outpatient f/u with Dr Acie Fredrickson   Jenkins Rouge MD [facility]

## 2020-06-19 ENCOUNTER — Other Ambulatory Visit: Payer: Self-pay

## 2020-06-19 ENCOUNTER — Other Ambulatory Visit: Payer: Self-pay | Admitting: Physician Assistant

## 2020-06-19 ENCOUNTER — Telehealth: Payer: Self-pay | Admitting: Family Medicine

## 2020-06-19 DIAGNOSIS — I5033 Acute on chronic diastolic (congestive) heart failure: Secondary | ICD-10-CM

## 2020-06-19 DIAGNOSIS — I5043 Acute on chronic combined systolic (congestive) and diastolic (congestive) heart failure: Secondary | ICD-10-CM

## 2020-06-19 LAB — BASIC METABOLIC PANEL
Anion gap: 9 (ref 5–15)
BUN: 58 mg/dL — ABNORMAL HIGH (ref 8–23)
CO2: 32 mmol/L (ref 22–32)
Calcium: 8.8 mg/dL — ABNORMAL LOW (ref 8.9–10.3)
Chloride: 100 mmol/L (ref 98–111)
Creatinine, Ser: 2.83 mg/dL — ABNORMAL HIGH (ref 0.44–1.00)
GFR calc Af Amer: 19 mL/min — ABNORMAL LOW (ref >60)
GFR calc non Af Amer: 17 mL/min — ABNORMAL LOW (ref >60)
Glucose, Bld: 161 mg/dL — ABNORMAL HIGH (ref 70–99)
Potassium: 4.7 mmol/L (ref 3.5–5.1)
Sodium: 141 mmol/L (ref 135–145)

## 2020-06-19 LAB — GLUCOSE, CAPILLARY: Glucose-Capillary: 145 mg/dL — ABNORMAL HIGH (ref 70–99)

## 2020-06-19 MED ORDER — BLOOD GLUCOSE MONITOR KIT: PACK | 0 refills | Status: DC

## 2020-06-19 MED ORDER — GLIMEPIRIDE 1 MG PO TABS: 1.0000 mg | ORAL_TABLET | Freq: Every day | ORAL | 0 refills | Status: DC

## 2020-06-19 NOTE — Progress Notes (Signed)
Progress Note  Patient Name: Beverly Martin Date of Encounter: 06/19/2020  [facility] Cardiologist: Dr Acie Fredrickson  Subjective   Pt denies CP or dyspnea  Inpatient Medications    Scheduled Meds: . aspirin EC  81 mg Oral Daily  . atorvastatin  40 mg Oral Daily  . calcitRIOL  0.25 mcg Oral Daily  . carvedilol  6.25 mg Oral BID WC  . enoxaparin (LOVENOX) injection  30 mg Subcutaneous Q24H  . ferrous sulfate  325 mg Oral BID WC  . furosemide  80 mg Oral Daily  . glimepiride  1 mg Oral Q breakfast  . sodium chloride flush  3 mL Intravenous Q12H   Continuous Infusions: . sodium chloride     PRN Meds: sodium chloride, hydrALAZINE, sodium chloride flush   Vital Signs    Vitals:   06/18/20 0818 06/18/20 1705 06/18/20 2051 06/19/20 0438  BP: (!) 138/58 (!) 157/76 (!) 153/87 109/85  Pulse: 73 70 86 73  Resp:  18 17 16   Temp:  98.1 F (36.7 C) 99 F (37.2 C) 98.2 F (36.8 C)  TempSrc:  Oral Oral Oral  SpO2:  98% 99% 100%  Weight:    58.1 kg  Height:        Intake/Output Summary (Last 24 hours) at 06/19/2020 0824 Last data filed at 06/19/2020 0700 Gross per 24 hour  Intake --  Output 2300 ml  Net -2300 ml   Last 3 Weights 06/19/2020 06/18/2020 06/17/2020  Weight (lbs) 128 lb 1.6 oz 132 lb 7.9 oz 132 lb 13.2 oz  Weight (kg) 58.106 kg 60.1 kg 60.25 kg      Telemetry    Sinus - Personally Reviewed  Physical Exam   GEN: No acute distress.   Neck: No JVD Cardiac: RRR, no murmurs, rubs, or gallops.  Respiratory: Clear to auscultation bilaterally. GI: Soft, nontender, non-distended  MS: trace to 1+ edema Neuro:  Nonfocal  Psych: Normal affect   Labs    High Sensitivity Troponin:   Recent Labs  Lab 06/10/20 0918 06/10/20 1556  TROPONINIHS 31* 32*      Chemistry Recent Labs  Lab 06/17/20 0635 06/18/20 0304 06/19/20 0433  NA 143 138 141  K 4.5 4.7 4.7  CL 99 97* 100  CO2 37* 32 32  GLUCOSE 167* 135* 161*  BUN 55* 58* 58*  CREATININE 3.12* 3.18* 2.83*   CALCIUM 9.0 8.6* 8.8*  GFRNONAA 15* 15* 17*  GFRAA 17* 17* 19*  ANIONGAP 7 9 9      Hematology Recent Labs  Lab 06/14/20 0345  WBC 5.0  RBC 3.65*  HGB 10.8*  HCT 34.1*  MCV 93.4  MCH 29.6  MCHC 31.7  RDW 13.2  PLT 169    Radiology    VAS US CAROTID  Result Date: 06/18/2020 Carotid Arterial Duplex Study Indications:       Right bruit. Risk Factors:      Hypertension, Diabetes, coronary artery disease. Comparison Study:  09-10-2004 Carotid duplex Performing Technologist: Darlin Coco  Examination Guidelines: A complete evaluation includes B-mode imaging, spectral Doppler, color Doppler, and power Doppler as needed of all accessible portions of each vessel. Bilateral testing is considered an integral part of a complete examination. Limited examinations for reoccurring indications may be performed as noted.  Right Carotid Findings: +----------+-------+-------+--------+------------------------+-----------------+           PSV    EDV    StenosisPlaque Description      Comments  cm/s   cm/s                                                     +----------+-------+-------+--------+------------------------+-----------------+ CCA Prox  71     17                                     intimal                                                                   thickening        +----------+-------+-------+--------+------------------------+-----------------+ CCA Distal108    28             calcific                                  +----------+-------+-------+--------+------------------------+-----------------+ ICA Prox  322    96     60-79%  heterogenous and                                                          irregular                                 +----------+-------+-------+--------+------------------------+-----------------+ ICA Mid   279    80                                                        +----------+-------+-------+--------+------------------------+-----------------+ ICA Distal93     36                                                       +----------+-------+-------+--------+------------------------+-----------------+ ECA       104                                                             +----------+-------+-------+--------+------------------------+-----------------+ +----------+--------+-------+----------------+-------------------+           PSV cm/sEDV cmsDescribe        Arm Pressure (mmHG) +----------+--------+-------+----------------+-------------------+ [id number]             Multiphasic, WNL                    +----------+--------+-------+----------------+-------------------+ +---------+--------+--+--------+--+---------+ VertebralPSV cm/s83EDV cm/s22Antegrade +---------+--------+--+--------+--+---------+  Left Carotid Findings: +----------+--------+--------+--------+------------------+------------------+           PSV cm/sEDV cm/sStenosisPlaque DescriptionComments           +----------+--------+--------+--------+------------------+------------------+ CCA Prox  101     22                                intimal thickening +----------+--------+--------+--------+------------------+------------------+ CCA Mid   121     29              calcific                             +----------+--------+--------+--------+------------------+------------------+ CCA Distal90      25                                                   +----------+--------+--------+--------+------------------+------------------+ ICA Prox  132     45      40-59%  heterogenous                         +----------+--------+--------+--------+------------------+------------------+ ICA Distal81      27                                                   +----------+--------+--------+--------+------------------+------------------+ ECA       206                      heterogenous      tortuous           +----------+--------+--------+--------+------------------+------------------+ +----------+--------+--------+----------------+-------------------+           PSV cm/sEDV cm/sDescribe        Arm Pressure (mmHG) +----------+--------+--------+----------------+-------------------+ [id number]             Multiphasic, WNL                    +----------+--------+--------+----------------+-------------------+ +---------+--------+--+--------+--+---------+ VertebralPSV cm/s69EDV cm/s20Antegrade +---------+--------+--+--------+--+---------+   Summary: Right Carotid: Velocities in the right ICA are consistent with a 60-79%                stenosis. Non-hemodynamically significant plaque <50% noted in                the CCA. Left Carotid: Velocities in the left ICA are consistent with a 40-59% stenosis.               Non-hemodynamically significant plaque <50% noted in the CCA. Vertebrals:  Bilateral vertebral arteries demonstrate antegrade flow. Subclavians: Normal flow hemodynamics were seen in bilateral subclavian              arteries. *See table(s) above for measurements and observations.     Preliminary     Patient Profile     64 y.o. female with past medical history of coronary artery disease status post coronary artery bypass graft, hypertension, hyperlipidemia, diabetes mellitus, chronic diastolic congestive heart failure, chronic kidney disease admitted with acute on chronic diastolic congestive heart failure and worsening renal function.  Echocardiogram August 2021 showed ejection fraction 45 to 11%, grade 2 diastolic dysfunction, mild mitral regurgitation.  Assessment &  Plan    1 acute on chronic diastolic congestive heart failure-volume status is much improved compared to admission. I/O-19,164.  Continue Lasix 80 mg daily.  2 acute on chronic stage IV kidney disease-patient has been followed by nephrology and should follow-up with them as an  outpatient.  Creatinine with some improvement today.  3 coronary artery disease status post coronary artery bypass graft-continue aspirin and statin.  4 cerebrovascular disease-patient will need follow-up as an outpatient.  Likely needs repeat study in 1 year.  5 hypertension-blood pressure will need to be followed as an outpatient.  If it remains elevated could add hydralazine or amlodipine.  Would avoid ARB or ACE inhibitor given severity of renal insufficiency.  Patient can be discharged from a cardiac standpoint.  Would continue present medications.  Will check potassium and renal function 1 week following discharge.  We will arrange APP follow-up in 2 to 4 weeks.  Follow-up Dr. Acie Fredrickson 3 months.  We will sign off.  Please call with questions.  For questions or updates, please contact [place] Please consult [email or web address] for contact info under        Signed, Kirk Ruths, MD  06/19/2020, 8:24 AM

## 2020-06-19 NOTE — Discharge Instructions (Signed)
Heart Failure, Diagnosis  Heart failure means that your heart is not able to pump blood in the right way. This makes it hard for your body to work well. Heart failure is usually a long-term (chronic) condition. You must take good care of yourself and follow your treatment plan from your doctor. What are the causes? This condition may be caused by:  High blood pressure.  Build up of cholesterol and fat in the arteries.  Heart attack. This injures the heart muscle.  Heart valves that do not open and close properly.  Damage of the heart muscle. This is also called cardiomyopathy.  Lung disease.  Abnormal heart rhythms. What increases the risk? The risk of heart failure goes up as a person ages. This condition is also more likely to develop in people who:  Are overweight.  Are female.  Smoke or chew tobacco.  Abuse alcohol or illegal drugs.  Have taken medicines that can damage the heart.  Have diabetes.  Have abnormal heart rhythms.  Have thyroid problems.  Have low blood counts (anemia). What are the signs or symptoms? Symptoms of this condition include:  Shortness of breath.  Coughing.  Swelling of the feet, ankles, legs, or belly.  Losing weight for no reason.  Trouble breathing.  Waking from sleep because of the need to sit up and get more air.  Rapid heartbeat.  Being very tired.  Feeling dizzy, or feeling like you may pass out (faint).  Having no desire to eat.  Feeling like you may vomit (nauseous).  Peeing (urinating) more at night.  Feeling confused. How is this treated?     This condition may be treated with:  Medicines. These can be given to treat blood pressure and to make the heart muscles stronger.  Changes in your daily life. These may include eating a healthy diet, staying at a healthy body weight, quitting tobacco and illegal drug use, or doing exercises.  Surgery. Surgery can be done to open blocked valves, or to put devices in  the heart, such as pacemakers.  A donor heart (heart transplant). You will receive a healthy heart from a donor. Follow these instructions at home:  Treat other conditions as told by your doctor. These may include high blood pressure, diabetes, thyroid disease, or abnormal heart rhythms.  Learn as much as you can about heart failure.  Get support as you need it.  Keep all follow-up visits as told by your doctor. This is important. Summary  Heart failure means that your heart is not able to pump blood in the right way.  This condition is caused by high blood pressure, heart attack, or damage of the heart muscle.  Symptoms of this condition include shortness of breath and swelling of the feet, ankles, legs, or belly. You may also feel very tired or feel like you may vomit.  You may be treated with medicines, surgery, or changes in your daily life.  Treat other health conditions as told by your doctor. This information is not intended to replace advice given to you by your health care provider. Make sure you discuss any questions you have with your health care provider. Document Revised: 12/17/2018 Document Reviewed: 12/17/2018 Elsevier Patient Education  [place].   Heart Failure, Self Care Heart failure is a serious condition. This sheet explains things you need to do to take care of yourself at home. To help you stay as healthy as possible, you may be asked to change your diet, take  certain medicines, and make other changes in your life. Your doctor may also give you more specific instructions. If you have problems or questions, call your doctor. What are the risks? Having heart failure makes it more likely for you to have some problems. These problems can get worse if you do not take good care of yourself. Problems may include:  Blood clotting problems. This may cause a stroke.  Damage to the kidneys, liver, or lungs.  Abnormal heart rhythms. Supplies needed:  Scale  for weighing yourself.  Blood pressure monitor.  Notebook.  Medicines. How to care for yourself when you have heart failure Medicines Take over-the-counter and prescription medicines only as told by your doctor. Take your medicines every day.  Do not stop taking your medicine unless your doctor tells you to do so.  Do not skip any medicines.  Get your prescriptions refilled before you run out of medicine. This is important. Eating and drinking   Eat heart-healthy foods. Talk with a diet specialist (dietitian) to create an eating plan.  Choose foods that: ? Have no trans fat. ? Are low in saturated fat and cholesterol.  Choose healthy foods, such as: ? Fresh or frozen fruits and vegetables. ? Fish. ? Low-fat (lean) meats. ? Legumes, such as beans, peas, and lentils. ? Fat-free or low-fat dairy products. ? Whole-grain foods. ? High-fiber foods.  Limit salt (sodium) if told by your doctor. Ask your diet specialist to tell you which seasonings are healthy for your heart.  Cook in healthy ways instead of frying. Healthy ways of cooking include roasting, grilling, broiling, baking, poaching, steaming, and stir-frying.  Limit how much fluid you drink, if told by your doctor. Alcohol use  Do not drink alcohol if: ? Your doctor tells you not to drink. ? Your heart was damaged by alcohol, or you have very bad heart failure. ? You are pregnant, may be pregnant, or are planning to become pregnant.  If you drink alcohol: ? Limit how much you use to:  0-1 drink a day for women.  0-2 drinks a day for men. ? Be aware of how much alcohol is in your drink. In the [place] one drink equals one 12 oz bottle of beer (355 mL), one 5 oz glass of wine (148 mL), or one 1 oz glass of hard liquor (44 mL). Lifestyle   Do not use any products that contain nicotine or tobacco, such as cigarettes, e-cigarettes, and chewing tobacco. If you need help quitting, ask your doctor. ? Do not use  nicotine gum or patches before talking to your doctor.  Do not use illegal drugs.  Lose weight if told by your doctor.  Do physical activity if told by your doctor. Talk to your doctor before you begin an exercise if: ? You are an older adult. ? You have very bad heart failure.  Learn to manage stress. If you need help, ask your doctor.  Get rehab (rehabilitation) to help you stay independent and to help with your quality of life.  Plan time to rest when you get tired. Check weight and blood pressure   Weigh yourself every day. This will help you to know if fluid is building up in your body. ? Weigh yourself every morning after you pee (urinate) and before you eat breakfast. ? Wear the same amount of clothing each time. ? Write down your daily weight. Give your record to your doctor.  Check and write down your blood pressure as told by  your doctor.  Check your pulse as told by your doctor. Dealing with very hot and very cold weather  If it is very hot: ? Avoid activities that take a lot of energy. ? Use air conditioning or fans, or find a cooler place. ? Avoid caffeine and alcohol. ? Wear clothing that is loose-fitting, lightweight, and light-colored.  If it is very cold: ? Avoid activities that take a lot of energy. ? Layer your clothes. ? Wear mittens or gloves, a hat, and a scarf when you go outside. ? Avoid alcohol. Follow these instructions at home:  Stay up to date with shots (vaccines). Get pneumococcal and flu (influenza) shots.  Keep all follow-up visits as told by your doctor. This is important. Contact a doctor if:  You gain weight quickly.  You have increasing shortness of breath.  You cannot do your normal activities.  You get tired easily.  You cough a lot.  You don't feel like eating or feel like you may vomit (nauseous).  You become puffy (swell) in your hands, feet, ankles, or belly (abdomen).  You cannot sleep well because it is hard to  breathe.  You feel like your heart is beating fast (palpitations).  You get dizzy when you stand up. Get help right away if:  You have trouble breathing.  You or someone else notices a change in your behavior, such as having trouble staying awake.  You have chest pain or discomfort.  You pass out (faint). These symptoms may be an emergency. Do not wait to see if the symptoms will go away. Get medical help right away. Call your local emergency services (911 in the [place] Do not drive yourself to the hospital. Summary  Heart failure is a serious condition. To care for yourself, you may have to change your diet, take medicines, and make other lifestyle changes.  Take your medicines every day. Do not stop taking them unless your doctor tells you to do so.  Eat heart-healthy foods, such as fresh or frozen fruits and vegetables, fish, lean meats, legumes, fat-free or low-fat dairy products, and whole-grain or high-fiber foods.  Ask your doctor if you can drink alcohol. You may have to stop alcohol use if you have very bad heart failure.  Contact your doctor if you gain weight quickly or feel that your heart is beating too fast. Get help right away if you pass out, or have chest pain or trouble breathing. This information is not intended to replace advice given to you by your health care provider. Make sure you discuss any questions you have with your health care provider. Document Revised: 01/11/2019 Document Reviewed: 01/12/2019 Elsevier Patient Education  [place].   Acute Kidney Injury, Adult  Acute kidney injury is a sudden worsening of kidney function. The kidneys are organs that have several jobs. They filter the blood to remove waste products and extra fluid. They also maintain a healthy balance of minerals and hormones in the body, which helps control blood pressure and keep bones strong. With this condition, your kidneys do not do their jobs as well as they should. This  condition ranges from mild to severe. Over time it may develop into long-lasting (chronic) kidney disease. Early detection and treatment may prevent acute kidney injury from developing into a chronic condition. What are the causes? Common causes of this condition include:  A problem with blood flow to the kidneys. This may be caused by: ? Low blood pressure (hypotension) or shock. ?  Blood loss. ? Heart and blood vessel (cardiovascular) disease. ? Severe burns. ? Liver disease.  Direct damage to the kidneys. This may be caused by: ? Certain medicines. ? A kidney infection. ? Poisoning. ? Being around or in contact with toxic substances. ? A surgical wound. ? A hard, direct hit to the kidney area.  A sudden blockage of urine flow. This may be caused by: ? Cancer. ? Kidney stones. ? An enlarged prostate in males. What are the signs or symptoms? Symptoms of this condition may not be obvious until the condition becomes severe. Symptoms of this condition can include:  Tiredness (lethargy), or difficulty staying awake.  Nausea or vomiting.  Swelling (edema) of the face, legs, ankles, or feet.  Problems with urination, such as: ? Abdominal pain, or pain along the side of your stomach (flank). ? Decreased urine production. ? Decrease in the force of urine flow.  Muscle twitches and cramps, especially in the legs.  Confusion or trouble concentrating.  Loss of appetite.  Fever. How is this diagnosed? This condition may be diagnosed with tests, including:  Blood tests.  Urine tests.  Imaging tests.  A test in which a sample of tissue is removed from the kidneys to be examined under a microscope (kidney biopsy). How is this treated? Treatment for this condition depends on the cause and how severe the condition is. In mild cases, treatment may not be needed. The kidneys may heal on their own. In more severe cases, treatment will involve:  Treating the cause of the kidney  injury. This may involve changing any medicines you are taking or adjusting your dosage.  Fluids. You may need specialized IV fluids to balance your body's needs.  Having a catheter placed to drain urine and prevent blockages.  Preventing problems from occurring. This may mean avoiding certain medicines or procedures that can cause further injury to the kidneys. In some cases treatment may also require:  A procedure to remove toxic wastes from the body (dialysis or continuous renal replacement therapy - CRRT).  Surgery. This may be done to repair a torn kidney, or to remove the blockage from the urinary system. Follow these instructions at home: Medicines  Take over-the-counter and prescription medicines only as told by your health care provider.  Do not take any new medicines without your health care provider's approval. Many medicines can worsen your kidney damage.  Do not take any vitamin and mineral supplements without your health care provider's approval. Many nutritional supplements can worsen your kidney damage. Lifestyle  If your health care provider prescribed changes to your diet, follow them. You may need to decrease the amount of protein you eat.  Achieve and maintain a healthy weight. If you need help with this, ask your health care provider.  Start or continue an exercise plan. Try to exercise at least 30 minutes a day, 5 days a week.  Do not use any tobacco products, such as cigarettes, chewing tobacco, and e-cigarettes. If you need help quitting, ask your health care provider. General instructions  Keep track of your blood pressure. Report changes in your blood pressure as told by your health care provider.  Stay up to date with immunizations. Ask your health care provider which immunizations you need.  Keep all follow-up visits as told by your health care provider. This is important. Where to find more information  American Association of Kidney Patients:  BombTimer.gl  National Kidney Foundation: www.kidney.[place]: https://mathis.com/  Life Options  Rehabilitation Program: ? www.lifeoptions.org ? www.kidneyschool.org Contact a health care provider if:  Your symptoms get worse.  You develop new symptoms. Get help right away if:  You develop symptoms of worsening kidney disease, which include: ? Headaches. ? Abnormally dark or light skin. ? Easy bruising. ? Frequent hiccups. ? Chest pain. ? Shortness of breath. ? End of menstruation in women. ? Seizures. ? Confusion or altered mental status. ? Abdominal or back pain. ? Itchiness.  You have a fever.  Your body is producing less urine.  You have pain or bleeding when you urinate. Summary Acu Type 2 Diabetes Mellitus, Self Care, Adult When you have type 2 diabetes (type 2 diabetes mellitus), you must make sure your blood sugar (glucose) stays in a healthy range. You can do this with: Nutrition. Exercise. Lifestyle changes. Medicines or insulin, if needed. Support from your doctors and others. How to stay aware of blood sugar  Check your blood sugar level every day, as often as told. Have your A1c (hemoglobin A1c) level checked two or more times a year. Have it checked more often if your doctor tells you to. Your doctor will set personal treatment goals for you. Generally, you should have these blood sugar levels: Before meals (preprandial): 80-130 mg/dL (4.4-7.2 mmol/L). After meals (postprandial): below 180 mg/dL (10 mmol/L). A1c level: less than 7%. How to manage high and low blood sugar Signs of high blood sugar High blood sugar is called hyperglycemia. Know the signs of high blood sugar. Signs may include: Feeling: Thirsty. Hungry. Very tired. Needing to pee (urinate) more than usual. Blurry vision. Signs of low blood sugar Low blood sugar is called hypoglycemia. This is when blood sugar is at or below 70 mg/dL (3.9 mmol/L). Signs may  include: Feeling: Hungry. Worried or nervous (anxious). Sweaty and clammy. Confused. Dizzy. Sleepy. Sick to your stomach (nauseous). Having: A fast heartbeat. A headache. A change in your vision. Jerky movements that you cannot control (seizure). Tingling or no feeling (numbness) around your mouth, lips, or tongue. Having trouble with: Moving (coordination). Sleeping. Passing out (fainting). Getting upset easily (irritability). Treating low blood sugar To treat low blood sugar, eat or drink something sugary right away. If you can think clearly and swallow safely, follow the 15:15 rule: Take 15 grams of a fast-acting carb (carbohydrate). Talk with your doctor about how much you should take. Some fast-acting carbs are: Sugar tablets (glucose pills). Take 3-4 pills. 6-8 pieces of hard candy. 4-6 oz (120-150 mL) of fruit juice. 4-6 oz (120-150 mL) of regular (not diet) soda. 1 Tbsp (15 mL) honey or sugar. Check your blood sugar 15 minutes after you take the carb. If your blood sugar is still at or below 70 mg/dL (3.9 mmol/L), take 15 grams of a carb again. If your blood sugar does not go above 70 mg/dL (3.9 mmol/L) after 3 tries, get help right away. After your blood sugar goes back to normal, eat a meal or a snack within 1 hour. Treating very low blood sugar If your blood sugar is at or below 54 mg/dL (3 mmol/L), you have very low blood sugar (severe hypoglycemia). This is an emergency. Do not wait to see if the symptoms will go away. Get medical help right away. Call your local emergency services (911 in the [place] If you have very low blood sugar and you cannot eat or drink, you may need a glucagon shot (injection). A family member or friend should learn how to check your blood  sugar and how to give you a glucagon shot. Ask your doctor if you need to have a glucagon shot kit at home. Follow these instructions at home: Medicine Take insulin and diabetes medicines as told. If your  doctor says you should take more or less insulin and medicines, do this exactly as told. Do not run out of insulin or medicines. Having diabetes can raise your risk for other long-term conditions. These include heart disease and kidney disease. Your doctor may prescribe medicines to help you not have these problems. Food  Make healthy food choices. These include: Chicken, fish, egg whites, and beans. Oats, whole wheat, bulgur, brown rice, quinoa, and millet. Fresh fruits and vegetables. Low-fat dairy products. Nuts, avocado, olive oil, and canola oil. Meet with a food specialist (dietitian). He or she can help you make an eating plan that is right for you. Follow instructions from your doctor about what you cannot eat or drink. Drink enough fluid to keep your pee (urine) pale yellow. Keep track of carbs that you eat. Do this by reading food labels and learning food serving sizes. Follow your sick day plan when you cannot eat or drink normally. Make this plan with your doctor so it is ready to use. Activity Exercise 3 or more times a week. Do not go more than 2 days without exercising. Talk with your doctor before you start a new exercise. Your doctor may need to tell you to change: How much insulin or medicines you take. How much food you eat. Lifestyle Do not use any tobacco products. These include cigarettes, chewing tobacco, and e-cigarettes. If you need help quitting, ask your doctor. Ask your doctor how much alcohol is safe for you. Learn to deal with stress. If you need help with this, ask your doctor. Body care  Stay up to date with your shots (immunizations). Have your eyes and feet checked by a doctor as often as told. Check your skin and feet every day. Check for cuts, bruises, redness, blisters, or sores. Brush your teeth and gums two times a day. Floss one or more times a day. Go to the dentist one or more times every 6 months. Stay at a healthy weight. General  instructions Take over-the-counter and prescription medicines only as told by your doctor. Share your diabetes care plan with: Your work or school. People you live with. Carry a card or wear jewelry that says you have diabetes. Keep all follow-up visits as told by your doctor. This is important. Questions to ask your doctor Do I need to meet with a diabetes educator? Where can I find a support group for people with diabetes? Where to find more information To learn more about diabetes, visit: American Diabetes Association: www.diabetes.org American Association of Diabetes Educators: www.diabeteseducator.org Summary When you have type 2 diabetes, you must make sure your blood sugar (glucose) stays in a healthy range. Check your blood sugar every day, as often as told. Having diabetes can raise your risk for other conditions. Your doctor may prescribe medicines to help you not have these problems. Keep all follow-up visits as told by your doctor. This is important. This information is not intended to replace advice given to you by your health care provider. Make sure you discuss any questions you have with your health care provider. Document Revised: 03/22/2018 Document Reviewed: 11/02/2015 Elsevier Patient Education  [place] kidney injury is a sudden worsening of kidney function.  Acute kidney injury can be caused by  problems with blood flow to the kidneys, direct damage to the kidneys, and sudden blockage of urine flow.  Symptoms of this condition may not be obvious until it becomes severe. Symptoms may include edema, lethargy, confusion, nausea or vomiting, and problems passing urine.  This condition can usually be diagnosed with blood tests, urine tests, and imaging tests. Sometimes a kidney biopsy is done to diagnose this condition.  Treatment for this condition often involves treating the underlying cause. It is treated with fluids, medicines, dialysis, diet changes,  or surgery. This information is not intended to replace advice given to you by your health care provider. Make sure you discuss any questions you have with your health care provider. Document Revised: 09/11/2017 Document Reviewed: 09/19/2016 Elsevier Patient Education  2020 [employer].

## 2020-06-19 NOTE — TOC Transition Note (Signed)
Transition of Care [facility]) - CM/SW Discharge Note   Patient Details  Name: Beverly Martin MRN: [id number] Date of Birth: [birth year]  [facility] CM/SW Contact:  Zenon Mayo, RN Phone Number: 06/19/2020, 10:20 AM   Clinical Narrative:    Patient for dc today, she states she does not need [place] services, she has no other needs.   Final next level of care: Home/Self Care Barriers to Discharge: No Barriers Identified   Patient Goals and CMS Choice Patient states their goals for this hospitalization and ongoing recovery are:: get better      Discharge Placement                       Discharge Plan and Services                  DME Agency: NA       HH Arranged: NA          Social Determinants of Health (SDOH) Interventions     Readmission Risk Interventions No flowsheet data found.

## 2020-06-19 NOTE — Discharge Summary (Signed)
Discharge Summary  Beverly Martin MRN:[id number] DOB: 01/06/1956  PCP: Banks, Shannon R, MD  Admit date: 06/09/2020 Discharge date: 06/19/2020  Time spent: 35 minutes  Recommendations for Outpatient Follow-up:  1. Follow-up with nephrology 2. Follow-up with cardiology 3. Follow-up with your primary care provider 4. Take your medications as prescribed 5. Avoid NSAIDs or nephrotoxins 6. Repeat BMP on 06/25/2020 as recommended by cardiology.  Discharge Diagnoses:  Active Hospital Problems   Diagnosis Date Noted  . Anasarca 06/10/2020  . Dyslipidemia 06/10/2020  . AKI (acute kidney injury) (HCC) 10/20/2017  . CAD (coronary artery disease)   . Essential hypertension   . Type 2 diabetes mellitus with hyperlipidemia (HCC)     Resolved Hospital Problems   Diagnosis Date Noted Date Resolved  . 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 Edema has been progressive for 2-3 months but worsened about 1 month ago. Associated SOB with exertion. No cough. No orthopnea. No PND. No chest pain. She has been out of her medications for a year or two.  ED Course:3+ bilateral LE pitting edema up to thighs, pulmonary edema on CXR. AKI with presenting creatinine 2.54.     Nephrology consulted and signed off 06/17/2020.  Cardiology consulted on 06/18/2020 due to worsening LVEF 45 to 50% and global hypokinesis on 2D echo.  Diuresed well with the assistance of nephrology, net I&O -19.1 L since  admission on 06/19/2020.  06/19/20: Seen and examined at her bedside.  She has no new complaints.  Feels better.  Advised to follow closely with nephrology and cardiology, to keep her appointments.  Patient understands and agrees to plan.    Hospital Course:  Principal Problem:   Anasarca Active Problems:   Essential hypertension   Type 2 diabetes mellitus with hyperlipidemia (HCC)   CAD (coronary artery disease)   AKI (acute kidney injury) (HCC)   Dyslipidemia  Anasarca, resolved, suspect multifactorial secondary to acute renal failure and acute systolic CHF. Patient presenting with worseningedema/anasarca extending all the way up to her upper thighs; also with dyspnea with minimal exertion. CXR consistent with pulmonary edema. BNP elevated > 3700.  Echocardiogram 06/10/20 had demonstrated EF 45-50%. Global hypokinesis.Grade II diastolic dysfunction. Cardiology has been consulted and CHF order set utilized. Volume overload on exam. Ongoing diuresing with p.o. Lasix 80 mg daily.  Follow-up with nephrology  Elevated troponins, likely demand ischemia:  Troponin peaked at 32 Denies any anginal symptoms at the time of this visit. Seen by cardiology. Continue aspirin, Lipitor, coreg, and Lasix Follow-up with cardiology  Nonoliguric AKI:  Per medical record Baseline creatinine appears to be 1.0 with GFR greater than 60. Presented with creatinine of 2.5 with GFR of 22  Hyaline casts on urinalysis Nephrology followed, signed off on 06/17/2020. Creatinine downtrending 2.8 from 3.1 from 2.7 from 3.36. Good urine output. Continue to avoid nephrotoxins Follow-up with nephrology. Repeat BMP on 06/25/2020 at cardiologist office.  Acute combined systolic and diastolic CHF Presented with   elevated BNP, bilateral lower extremity edema 2D echo done on 06/10/2020 showing LVEF 45 to 50% and type II diastolic dysfunction Continue cardiac medications Diuretics per nephrology Net I&O -19.1 L since  admission Follow-up with cardiology  Essential HTN:  BP is at goal. Continue home Coreg, p.o. Lasix  HLD:  ContinueLipitor 40 mg daily.  Lipid panel reveals an LDL of 157 Goal LDL less than 70. Follow-up with cardiology  DM II with hyperglycemia: Hemoglobin A1c of 8.2. Started glimepiride 1 mg daily Follow-up with your PCP  CAD: s/p CABG.  Denies any anginal symptoms Continue ASA daily, Coreg, and Lipitor  Follow-up with cardiology Bilateral carotid Doppler ultrasound done on 06/18/2020 showed right ICA 60 to 79% stenosis.  Patient informed of these findings.   Code Status:Full   Consultants:  Nephrology  Cardiology   Discharge Exam: BP (!) 145/63   Pulse 77   Temp 98.2 F (36.8 C) (Oral)   Resp 16   Ht 5' 4" (1.626 m)   Wt 58.1 kg   SpO2 100%   BMI 21.99 kg/m  . General: 64 y.o. year-old female well developed well nourished in no acute distress.  Alert and oriented x3. . Cardiovascular: Regular rate and rhythm with no rubs or gallops.  No thyromegaly or JVD noted.   Marland Kitchen Respiratory: Clear to auscultation with no wheezes or rales. Good inspiratory effort. . Abdomen: Soft nontender nondistended with normal bowel sounds x4 quadrants. . Musculoskeletal: Trace lower extremity edema bilaterally. Marland Kitchen Psychiatry: Mood is appropriate for condition and setting  Discharge Instructions You were cared for by a hospitalist during your hospital stay. If you have any questions about your discharge medications or the care you received while you were in the hospital after you are discharged, you can call the unit and asked to speak with the hospitalist on call if the hospitalist that took care of you is not available. Once you are discharged, your primary care physician will handle any further medical issues. Please note that NO REFILLS for any discharge medications will be authorized once you are discharged, as it is imperative that you return to your primary care physician  (or establish a relationship with a primary care physician if you do not have one) for your aftercare needs so that they can reassess your need for medications and monitor your lab values.   Allergies as of 06/19/2020   No Known Allergies     Medication List    STOP taking these medications   amLODipine 10 MG tablet Commonly known as: NORVASC     TAKE these medications   aspirin 81 MG EC tablet Take 1 tablet (81 mg total) by mouth daily. Swallow whole.   atorvastatin 40 MG tablet Commonly known as: LIPITOR Take 1 tablet (40 mg total) by mouth daily. What changed: when to take this   blood glucose meter kit and supplies Kit Dispense based on patient and insurance preference. Use up to four times daily as directed. (FOR ICD-9 250.00, 250.01).   calcitRIOL 0.25 MCG capsule Commonly known as: ROCALTROL Take 1 capsule (0.25 mcg total) by mouth daily.   carvedilol 6.25 MG tablet Commonly known as: COREG Take 1 tablet (6.25 mg total) by mouth 2 (two) times daily with a meal.   ferrous sulfate 325 (65 FE) MG tablet Take 1 tablet (325 mg total) by mouth 2 (two) times daily with a meal.   furosemide 80 MG tablet Commonly known as: LASIX Take 1 tablet (80 mg total) by  mouth daily.   glimepiride 1 MG tablet Commonly known as: AMARYL Take 1 tablet (1 mg total) by mouth daily with breakfast.      No Known Allergies  Follow-up Information    [facility] Follow up.   Why: FOr Primary care, pharmacy and finacial plannig Contact information: [address] [place] [address] [phone]       Banks, Shannon R, MD. Call in 1 day(s).   Specialty: Family Medicine Why: Please call for a post hospital follow-up appointment. Contact information: [address] [place] [address] [phone]        Webb, Martin, MD. Call in 1 day(s).   Specialty: Nephrology Why: Please call for a post hospital follow-up  appointment. Contact information: [address] [place] [address] [phone]        [facility] Office Follow up.   Specialty: Cardiology Why: [facility] location - please come to this office on Monday September 13th for labwork only, scheduled for 11:15am. Please arrive 10 minutes early to check in. You do not have to be fasting. Contact information: [address], [address] [place] [address] [phone]       Weaver, Scott T, PA-C Follow up.   Specialties: Cardiology, Physician Assistant Why: [facility] location - a follow-up has been scheduled for you on Monday July 09, 2020 at 11:15 AM (Arrive by 11:00 AM). Scott is one of our PAs that works closely with our cardiology team. Contact information: [address] [place] [address] [phone]                The results of significant diagnostics from this hospitalization (including imaging, microbiology, ancillary and laboratory) are listed below for reference.    Significant Diagnostic Studies: DG Chest 2 View  Result Date: 06/09/2020 CLINICAL DATA:  Shortness of breath and bilateral lower extremity edema times 2-3 months. EXAM: CHEST - 2 VIEW COMPARISON:  October 20, 2017 FINDINGS: Multiple sternal wires and vascular clips are seen. Mild prominence of the pulmonary vasculature is noted. Mild left basilar atelectasis is present. Small bilateral pleural effusions are seen. No pneumothorax is identified. The cardiac silhouette is mildly enlarged. The visualized skeletal structures are unremarkable. IMPRESSION: 1. Mild left basilar atelectasis with additional findings suggestive of mild pulmonary vascular congestion. 2. Small bilateral pleural effusions. Electronically Signed   By: Thaddeus  Houston M.D.   On: 06/09/2020 19:43   US RENAL  Result Date: 06/10/2020 CLINICAL DATA:  AK I EXAM: RENAL / URINARY TRACT ULTRASOUND  COMPLETE COMPARISON:  None. FINDINGS: Right Kidney: Renal measurements: 9.3 x 3.9 x 4.3 cm = volume: 82 mL. Echogenicity appears mildly increased. No mass or hydronephrosis visualized. Left Kidney: Renal measurements: 9.2 x 4.6 x 5.1 cm = volume: 110 mL. Echogenicity appears mildly increased. No mass or hydronephrosis visualized. Bladder: Appears normal for degree of bladder distention. Other: None. IMPRESSION: Mild increase in bilateral renal echogenicity as can be seen in medical renal disease. Electronically Signed   By: Nancy  Ballantyne M.D.   On: 06/10/2020 14:02   ECHOCARDIOGRAM COMPLETE  Result Date: 06/10/2020    ECHOCARDIOGRAM REPORT   Patient Name:   Natina L Edgin Date of Exam: 06/10/2020 Medical Rec #:  [id number]      Height:       63.0 in Accession #:    [id number]     Weight:         160.0 lb Date of Birth:  11/16/1955      BSA:          1.759 m Patient Age:    64 years       BP:           144/104 mmHg Patient Gender: F              HR:           94 bpm. Exam Location:  Inpatient Procedure: 2D Echo Indications:    acute systolic chf 428.21  History:        Patient has prior history of Echocardiogram examinations, most                 recent 10/21/2017. CAD, Signs/Symptoms:anasarca; Risk                 Factors:Hypertension and Dyslipidemia.  Sonographer:    Lauren Pennington Referring Phys: [id number] JENNIFER YATES IMPRESSIONS  1. Left ventricular ejection fraction, by estimation, is 45 to 50%. The left ventricle has mildly decreased function. The left ventricle demonstrates global hypokinesis. Left ventricular diastolic parameters are consistent with Grade II diastolic dysfunction (pseudonormalization).  2. Right ventricular systolic function is normal. The right ventricular size is normal.  3. The mitral valve is normal in structure. Mild mitral valve regurgitation. No evidence of mitral stenosis.  4. The aortic valve is normal in structure. Aortic valve regurgitation is not visualized. No aortic stenosis  is present.  5. The inferior vena cava is normal in size with greater than 50% respiratory variability, suggesting right atrial pressure of 3 mmHg. FINDINGS  Left Ventricle: Left ventricular ejection fraction, by estimation, is 45 to 50%. The left ventricle has mildly decreased function. The left ventricle demonstrates global hypokinesis. The left ventricular internal cavity size was normal in size. There is  no left ventricular hypertrophy. Left ventricular diastolic parameters are consistent with Grade II diastolic dysfunction (pseudonormalization). Right Ventricle: The right ventricular size is normal. No increase in right ventricular wall thickness. Right ventricular systolic function is normal. Left Atrium: Left atrial size was normal in size. Right Atrium: Right atrial size was normal in size. Pericardium: There is no evidence of pericardial effusion. Mitral Valve: The mitral valve is normal in structure. Normal mobility of the mitral valve leaflets. Mild mitral valve regurgitation. No evidence of mitral valve stenosis. Tricuspid Valve: The tricuspid valve is normal in structure. Tricuspid valve regurgitation is trivial. No evidence of tricuspid stenosis. Aortic Valve: The aortic valve is normal in structure. Aortic valve regurgitation is not visualized. No aortic stenosis is present. Pulmonic Valve: The pulmonic valve was normal in structure. Pulmonic valve regurgitation is mild. No evidence of pulmonic stenosis. Aorta: The aortic root is normal in size and structure. Venous: The inferior vena cava is normal in size with greater than 50% respiratory variability, suggesting right atrial pressure of 3 mmHg. IAS/Shunts: No atrial level shunt detected by color flow Doppler.  LEFT VENTRICLE PLAX 2D LVIDd:         4.90 cm  Diastology LVIDs:         3.70 cm  LV e' lateral:   4.13 cm/s LV PW:         1.00 cm  LV E/e' lateral: 25.4 LV IVS:        1.00 cm LVOT diam:     1.65 cm LV SV:         40 LV SV Index:   23 LVOT  Area:       2.14 cm  RIGHT VENTRICLE            IVC RV S prime:     8.49 cm/s  IVC diam: 1.60 cm TAPSE (M-mode): 1.3 cm LEFT ATRIUM             Index       RIGHT ATRIUM           Index LA diam:        4.00 cm 2.27 cm/m  RA Area:     13.70 cm LA Vol (A2C):   51.9 ml 29.51 ml/m RA Volume:   32.40 ml  18.42 ml/m LA Vol (A4C):   51.1 ml 29.06 ml/m LA Biplane Vol: 53.0 ml 30.14 ml/m  AORTIC VALVE LVOT Vmax:   103.00 cm/s LVOT Vmean:  65.000 cm/s LVOT VTI:    0.189 m  AORTA Ao Root diam: 2.80 cm Ao Asc diam:  2.80 cm MITRAL VALVE MV Area (PHT): 5.02 cm     SHUNTS MV Decel Time: 151 msec     Systemic VTI:  0.19 m MV E velocity: 105.00 cm/s  Systemic Diam: 1.65 cm MV A velocity: 92.40 cm/s MV E/A ratio:  1.14 Candee Furbish MD Electronically signed by Candee Furbish MD Signature Date/Time: 06/10/2020/5:10:40 PM    Final    VAS US CAROTID  Result Date: 06/18/2020 Carotid Arterial Duplex Study Indications:       Right bruit. Risk Factors:      Hypertension, Diabetes, coronary artery disease. Comparison Study:  09-10-2004 Carotid duplex Performing Technologist: Darlin Coco  Examination Guidelines: A complete evaluation includes B-mode imaging, spectral Doppler, color Doppler, and power Doppler as needed of all accessible portions of each vessel. Bilateral testing is considered an integral part of a complete examination. Limited examinations for reoccurring indications may be performed as noted.  Right Carotid Findings: +----------+-------+-------+--------+------------------------+-----------------+           PSV    EDV    StenosisPlaque Description      Comments                    cm/s   cm/s                                                     +----------+-------+-------+--------+------------------------+-----------------+ CCA Prox  71     17                                     intimal                                                                   thickening         +----------+-------+-------+--------+------------------------+-----------------+ CCA Distal108    28             calcific                                  +----------+-------+-------+--------+------------------------+-----------------+ ICA Prox  322  96     60-79%  heterogenous and                                                          irregular                                 +----------+-------+-------+--------+------------------------+-----------------+ ICA Mid   279    80                                                       +----------+-------+-------+--------+------------------------+-----------------+ ICA Distal93     36                                                       +----------+-------+-------+--------+------------------------+-----------------+ ECA       104                                                             +----------+-------+-------+--------+------------------------+-----------------+ +----------+--------+-------+----------------+-------------------+           PSV cm/sEDV cmsDescribe        Arm Pressure (mmHG) +----------+--------+-------+----------------+-------------------+ [id number]             Multiphasic, WNL                    +----------+--------+-------+----------------+-------------------+ +---------+--------+--+--------+--+---------+ VertebralPSV cm/s83EDV cm/s22Antegrade +---------+--------+--+--------+--+---------+  Left Carotid Findings: +----------+--------+--------+--------+------------------+------------------+           PSV cm/sEDV cm/sStenosisPlaque DescriptionComments           +----------+--------+--------+--------+------------------+------------------+ CCA Prox  101     22                                intimal thickening +----------+--------+--------+--------+------------------+------------------+ CCA Mid   121     29              calcific                              +----------+--------+--------+--------+------------------+------------------+ CCA Distal90      25                                                   +----------+--------+--------+--------+------------------+------------------+ ICA Prox  132     45      40-59%  heterogenous                         +----------+--------+--------+--------+------------------+------------------+ ICA Distal81      27                                                   +----------+--------+--------+--------+------------------+------------------+  ECA       206                     heterogenous      tortuous           +----------+--------+--------+--------+------------------+------------------+ +----------+--------+--------+----------------+-------------------+           PSV cm/sEDV cm/sDescribe        Arm Pressure (mmHG) +----------+--------+--------+----------------+-------------------+ Subclavian161             Multiphasic, WNL                    +----------+--------+--------+----------------+-------------------+ +---------+--------+--+--------+--+---------+ VertebralPSV cm/s69EDV cm/s20Antegrade +---------+--------+--+--------+--+---------+   Summary: Right Carotid: Velocities in the right ICA are consistent with a 60-79%                stenosis. Non-hemodynamically significant plaque <50% noted in                the CCA. Left Carotid: Velocities in the left ICA are consistent with a 40-59% stenosis.               Non-hemodynamically significant plaque <50% noted in the CCA. Vertebrals:  Bilateral vertebral arteries demonstrate antegrade flow. Subclavians: Normal flow hemodynamics were seen in bilateral subclavian              arteries. *See table(s) above for measurements and observations.     Preliminary     Microbiology: Recent Results (from the past 240 hour(s))  SARS Coronavirus 2 by RT PCR (hospital order, performed in [place] hospital lab) Nasopharyngeal Nasopharyngeal Swab      Status: None   Collection Time: 06/10/20 11:34 AM   Specimen: Nasopharyngeal Swab  Result Value Ref Range Status   SARS Coronavirus 2 NEGATIVE NEGATIVE Final    Comment: (NOTE) SARS-CoV-2 target nucleic acids are NOT DETECTED.  The SARS-CoV-2 RNA is generally detectable in upper and lower respiratory specimens during the acute phase of infection. The lowest concentration of SARS-CoV-2 viral copies this assay can detect is 250 copies / mL. A negative result does not preclude SARS-CoV-2 infection and should not be used as the sole basis for treatment or other patient management decisions.  A negative result may occur with improper specimen collection / handling, submission of specimen other than nasopharyngeal swab, presence of viral mutation(s) within the areas targeted by this assay, and inadequate number of viral copies (<250 copies / mL). A negative result must be combined with clinical observations, patient history, and epidemiological information.  Fact Sheet for Patients:   https://www.fda.gov/media/136312/download  Fact Sheet for Healthcare Providers: https://www.fda.gov/media/136313/download  This test is not yet approved or  cleared by the [place] FDA and has been authorized for detection and/or diagnosis of SARS-CoV-2 by FDA under an Emergency Use Authorization (EUA).  This EUA will remain in effect (meaning this test can be used) for the duration of the COVID-19 declaration under Section 564(b)(1) of the Act, 21 U.S.C. section 360bbb-3(b)(1), unless the authorization is terminated or revoked sooner.  Performed at [facility], [address] [place], [place] [address]      Labs: Basic Metabolic Panel: Recent Labs  Lab 06/15/20 1438 06/16/20 0926 06/17/20 0635 06/18/20 0304 06/19/20 0433  NA 144 143 143 138 141  K 4.4 4.6 4.5 4.7 4.7  CL 96* 101 99 97* 100  CO2 38* 33* 37* 32 32  GLUCOSE 168* 180* 167* 135* 161*  BUN 53* 51* 55* 58* 58*    CREATININE   2.99* 2.71* 3.12* 3.18* 2.83*  CALCIUM 8.7* 8.8* 9.0 8.6* 8.8*   Liver Function Tests: No results for input(s): AST, ALT, ALKPHOS, BILITOT, PROT, ALBUMIN in the last 168 hours. No results for input(s): LIPASE, AMYLASE in the last 168 hours. No results for input(s): AMMONIA in the last 168 hours. CBC: Recent Labs  Lab 06/14/20 0345  WBC 5.0  HGB 10.8*  HCT 34.1*  MCV 93.4  PLT 169   Cardiac Enzymes: No results for input(s): CKTOTAL, CKMB, CKMBINDEX, TROPONINI in the last 168 hours. BNP: BNP (last 3 results) Recent Labs    06/10/20 1039  BNP 3,758.8*    ProBNP (last 3 results) No results for input(s): PROBNP in the last 8760 hours.  CBG: Recent Labs  Lab 06/18/20 0647 06/18/20 1116 06/18/20 1704 06/18/20 2112 06/19/20 0730  GLUCAP 118* 202* 121* 175* 145*       Signed:  Carole N Hall, MD [facility] 06/19/2020, 12:01 PM 

## 2020-06-19 NOTE — Progress Notes (Signed)
Per Dr. Jacalyn Lefevre request, lab appt made for BMET on 9/13 and f/u OV 9/27. Appt info placed on AVS. Relayed to nurse to confirm AVS not yet printed yet to ensure these make it to the final copy.  Lanett Lasorsa PA-C

## 2020-06-19 NOTE — Telephone Encounter (Signed)
Transition Care Management Unsuccessful Follow-up Telephone Call  Date of discharge and from where:  06/19/2020 from [facility]   Attempts:  1st Attempt  Reason for unsuccessful TCM follow-up call:  Left voice message

## 2020-06-20 ENCOUNTER — Other Ambulatory Visit: Payer: Self-pay

## 2020-06-20 NOTE — Telephone Encounter (Signed)
Transition Care Management Follow-up Telephone Call  Date of discharge and from where: 06/19/2020 at [facility]   How have you been since you were released from the hospital? Patient states she is doing fine   Any questions or concerns? No  Items Reviewed:  Did the pt receive and understand the discharge instructions provided? Yes   Medications obtained and verified? Yes   Any new allergies since your discharge? No   Dietary orders reviewed? Yes  Do you have support at home? Yes , patient states her husband lives with her   Functional Questionnaire: (I = Independent and D = Dependent) ADLs: I  Bathing/Dressing- I  Meal Prep- I  Eating- I  Maintaining continence- I  Transferring/Ambulation-I  Managing Meds- I  Follow up appointments reviewed:   PCP Hospital f/u appt confirmed? Yes  Scheduled to see Dr. Volanda Napoleon  on 06/21/2020 @ 10:00 am.  [facility] f/u appt confirmed? Yes  Scheduled to see Dr.Weaver on 07/09/2020 @ 11:15 am .  Are transportation arrangements needed? No   If their condition worsens, is the pt aware to call PCP or go to the Emergency Dept.? Yes  Was the patient provided with contact information for the PCP's office or ED? Yes  Was to pt encouraged to call back with questions or concerns? Yes

## 2020-06-21 ENCOUNTER — Encounter: Payer: Self-pay | Admitting: Family Medicine

## 2020-06-21 ENCOUNTER — Ambulatory Visit: Payer: 59 | Admitting: Family Medicine

## 2020-06-21 ENCOUNTER — Other Ambulatory Visit: Payer: Self-pay

## 2020-06-21 VITALS — BP 158/72 | HR 72 | Temp 98.7°F | Wt 125.0 lb

## 2020-06-21 DIAGNOSIS — I5021 Acute systolic (congestive) heart failure: Secondary | ICD-10-CM | POA: Diagnosis not present

## 2020-06-21 DIAGNOSIS — E1169 Type 2 diabetes mellitus with other specified complication: Secondary | ICD-10-CM

## 2020-06-21 DIAGNOSIS — Z09 Encounter for follow-up examination after completed treatment for conditions other than malignant neoplasm: Secondary | ICD-10-CM

## 2020-06-21 DIAGNOSIS — E785 Hyperlipidemia, unspecified: Secondary | ICD-10-CM

## 2020-06-21 DIAGNOSIS — N179 Acute kidney failure, unspecified: Secondary | ICD-10-CM | POA: Diagnosis not present

## 2020-06-21 NOTE — Progress Notes (Signed)
Subjective:    Patient ID: Beverly Martin, female    DOB: 1956/10/04, 64 y.o.   MRN: [id number]  No chief complaint on file. Pt accompanied by her husband [name]  HPI Pt is a 64 yo female with pmh sig for CAD, DM II, HTN, HLD who was lost to f/u was seen for HFU.  Pt admitted 8/28-06/19/20 for ansarca due to ARF and new onset CHF from uncontrolled HTN.   Pt with 3+ LE pitting edema, pulmonary edema on CXR, and AKI creat 2.54.  Nephrology and Cardiology consulted.  ECHO with LVEF 45-50% with global hypokinesis.  Pt diuresed, -19.1L.  Troponin elevated, peak 32 likely from demand ischemia.  Pt states she is feeling better since being d/c.  Pt states she has to be careful about eating food people bring her as it is often salty.  Pt states she does not want to offend anyone by no accepting their food.  Denies SOB, CP, dizziness, LE edema.  Past Medical History:  Diagnosis Date  . CAD (coronary artery disease)   . Diabetes mellitus without complication ([place])   . Essential hypertension   . Hyperlipidemia     No Known Allergies  ROS General: Denies fever, chills, night sweats, changes in weight, changes in appetite HEENT: Denies headaches, ear pain, changes in vision, rhinorrhea, sore throat CV: Denies CP, palpitations, SOB, orthopnea  +LE edema Pulm: Denies SOB, cough, wheezing GI: Denies abdominal pain, nausea, vomiting, diarrhea, constipation GU: Denies dysuria, hematuria, frequency, vaginal discharge Msk: Denies muscle cramps, joint pains Neuro: Denies weakness, numbness, tingling Skin: Denies rashes, bruising Psych: Denies depression, anxiety, hallucinations     Objective:    Blood pressure (!) 158/72, pulse 72, temperature 98.7 F (37.1 C), temperature source Oral, weight 125 lb (56.7 kg), SpO2 95 %.   Gen. Pleasant, well-nourished, in no distress, normal affect  HEENT: [place]/AT, face symmetric, conjunctiva clear, no scleral icterus, PERRLA, EOMI, nares patent without  drainage Lungs: no accessory muscle use, CTAB, no wheezes or rales Cardiovascular: RRR, no m/r/g, trace to 1 + edema in feet to mid shins b/l Musculoskeletal: No deformities, no cyanosis or clubbing, normal tone Neuro:  A&Ox3, CN II-XII intact, normal gait Skin:  Warm, no lesions/ rash   Wt Readings from Last 3 Encounters:  06/19/20 128 lb 1.6 oz (58.1 kg)  06/09/18 132 lb (59.9 kg)  03/09/18 132 lb 9.6 oz (60.1 kg)    Lab Results  Component Value Date   WBC 5.0 06/14/2020   HGB 10.8 (L) 06/14/2020   HCT 34.1 (L) 06/14/2020   PLT 169 06/14/2020   GLUCOSE 161 (H) 06/19/2020   CHOL 245 (H) 06/10/2020   TRIG 93 06/10/2020   HDL 69 06/10/2020   LDLCALC 157 (H) 06/10/2020   ALT 16 06/09/2020   AST 12 (L) 06/09/2020   NA 141 06/19/2020   K 4.7 06/19/2020   CL 100 06/19/2020   CREATININE 2.83 (H) 06/19/2020   BUN 58 (H) 06/19/2020   CO2 32 06/19/2020   TSH 2.053 06/10/2020   INR 0.92 10/20/2017   HGBA1C 8.2 (H) 06/10/2020    Assessment/Plan: Pt is a 64 yo female lost to follow up last seen 05/2018, with pmh sig for HTN, CAD, DM II, HLD who was seen for HFU s/p admission for new onset CHF and ARF 2/2 uncontrolled HTN.  Acute systolic heart failure ([place])  -Echo 06/10/2020 with EF 45-50%, LV with mildly decreased function expressed and global hypokinesis, grade 2 diastolic dysfunction, mild  pulmonary valve regurg. -BNP >3700 -continue lasix 80 mg daily, coreg 6.25 mg BID -Repeat BMP and BNP.  Will make med adjustments if needed based on labs. -lifestyle modifications strongly encouraged. -daily wts.and lifestyle modifications - Plan: BASIC METABOLIC PANEL WITH GFR, Brain Natriuretic Peptide, Brain Natriuretic Peptide, BASIC METABOLIC PANEL WITH GFR  AKI (acute kidney injury) ([place]) -avoid nephrotoxic meds. -renally dose meds. -will repeat BMP and make adjustments as needed. -We will place referral to nephrology. - Plan: BASIC METABOLIC PANEL WITH GFR  Type 2 diabetes  mellitus with hyperlipidemia ([facility]) -Hemoglobin A1c 8.2% on 06/10/2020 -Discussed importance of lifestyle modifications -Continue glimepiride 1 mg.  Monitor for hypoglycemia given renal impairment.   -We will repeat BMP and make med adjustments as needed -will obtain microalbumin creatinine ratio -will schedule diabetic retinopathy exam with ophthalmology-referral placed.  Hospital discharge follow-up -TCM phone call made and reviewed. -hospital chart and labs reviewed.  F/u prn in 1 month  Grier Mitts, MD

## 2020-06-21 NOTE — Patient Instructions (Signed)
Heart Failure, Diagnosis  Heart failure means that your heart is not able to pump blood in the right way. This makes it hard for your body to work well. Heart failure is usually a long-term (chronic) condition. You must take good care of yourself and follow your treatment plan from your doctor. What are the causes? This condition may be caused by:  High blood pressure.  Build up of cholesterol and fat in the arteries.  Heart attack. This injures the heart muscle.  Heart valves that do not open and close properly.  Damage of the heart muscle. This is also called cardiomyopathy.  Lung disease.  Abnormal heart rhythms. What increases the risk? The risk of heart failure goes up as a person ages. This condition is also more likely to develop in people who:  Are overweight.  Are female.  Smoke or chew tobacco.  Abuse alcohol or illegal drugs.  Have taken medicines that can damage the heart.  Have diabetes.  Have abnormal heart rhythms.  Have thyroid problems.  Have low blood counts (anemia). What are the signs or symptoms? Symptoms of this condition include:  Shortness of breath.  Coughing.  Swelling of the feet, ankles, legs, or belly.  Losing weight for no reason.  Trouble breathing.  Waking from sleep because of the need to sit up and get more air.  Rapid heartbeat.  Being very tired.  Feeling dizzy, or feeling like you may pass out (faint).  Having no desire to eat.  Feeling like you may vomit (nauseous).  Peeing (urinating) more at night.  Feeling confused. How is this treated?     This condition may be treated with:  Medicines. These can be given to treat blood pressure and to make the heart muscles stronger.  Changes in your daily life. These may include eating a healthy diet, staying at a healthy body weight, quitting tobacco and illegal drug use, or doing exercises.  Surgery. Surgery can be done to open blocked valves, or to put devices in  the heart, such as pacemakers.  A donor heart (heart transplant). You will receive a healthy heart from a donor. Follow these instructions at home:  Treat other conditions as told by your doctor. These may include high blood pressure, diabetes, thyroid disease, or abnormal heart rhythms.  Learn as much as you can about heart failure.  Get support as you need it.  Keep all follow-up visits as told by your doctor. This is important. Summary  Heart failure means that your heart is not able to pump blood in the right way.  This condition is caused by high blood pressure, heart attack, or damage of the heart muscle.  Symptoms of this condition include shortness of breath and swelling of the feet, ankles, legs, or belly. You may also feel very tired or feel like you may vomit.  You may be treated with medicines, surgery, or changes in your daily life.  Treat other health conditions as told by your doctor. This information is not intended to replace advice given to you by your health care provider. Make sure you discuss any questions you have with your health care provider. Document Revised: 12/17/2018 Document Reviewed: 12/17/2018 Elsevier Patient Education  [place] With Heart Failure  Heart failure is a long-term (chronic) condition in which the heart cannot pump enough blood through the body. When this happens, parts of the body do not get the blood and oxygen they need. There is no cure  for heart failure at this time, so it is important for you to take good care of yourself and follow the treatment plan set by your health care provider. If you are living with heart failure, there are ways to help you manage the disease. Follow these instructions at home: Living with heart failure requires you to make changes in your life. Your health care team will teach you about the changes you need to make in order to relieve your symptoms and lower your risk of going to the  hospital. Follow the treatment plan as set by your health care provider. Medicines Medicines are important in reducing your heart's workload, slowing the progression of heart failure, and improving your symptoms.  Take over-the-counter and prescription medicines only as told by your health care provider.  Do not stop taking your medicine unless your health care provider tells you to do that.  Do not skip any dose of your medicine.  Refill prescriptions before you run out of medicine. You need your medicines every day. Eating and drinking   Eat heart-healthy foods. Talk with a dietitian to make an eating plan that is right for you. ? If directed by your health care provider:  Limit salt (sodium). Lowering your sodium intake may reduce symptoms of heart failure. Ask a dietitian to recommend heart-healthy seasonings.  Limit your fluid intake. Fluid restriction may reduce symptoms of heart failure. ? Use low-fat cooking methods instead of frying. Low-fat methods include roasting, grilling, broiling, baking, poaching, steaming, and stir-frying. ? Choose foods that contain no trans fat and are low in saturated fat and cholesterol. Healthy choices include fresh or frozen fruits and vegetables, fish, lean meats, legumes, fat-free or low-fat dairy products, and whole-grain or high-fiber foods.  Limit alcohol intake to no more than 1 drink a day for nonpregnant women and 2 drinks a day for men. One drink equals 12 oz of beer, 5 oz of wine, or 1 oz of hard liquor. ? Drinking more than that is harmful to your heart. Tell your health care provider if you drink alcohol several times a week. ? Talk with your health care provider about whether any level of alcohol use is safe for you. Activity   Ask your health care provider about attending cardiac rehabilitation. These programs include aerobic physical activity, which provides many benefits for your heart.  If no cardiac rehabilitation program is  available, ask your health care provider what aerobic exercises are safe for you to do. Lifestyle Make the lifestyle changes recommended by your health care provider. In general:  Lose weight if your health care provider tells you to do that. Weight loss may reduce symptoms of heart failure.  Do not use any products that contain nicotine or tobacco, such as cigarettes or e-cigarettes. If you need help quitting, ask your health care provider.  Do not use street (illegal) drugs.  Return to your normal activities as told by your health care provider. Ask your health care provider what activities are safe for you. General instructions   Make sure you weigh yourself every day to track your weight. Rapid weight gain may indicate an increase in fluid in your body and may increase the workload of your heart. ? Weigh yourself every morning. Do this after you urinate but before you eat breakfast. ? Wear the same type of clothing, without shoes, each time you weigh yourself. ? Weigh yourself on the same scale and in the same spot each time.  Living with chronic  heart failure often leads to emotions such as fear, stress, anxiety, and depression. If you feel any of these emotions and need help coping, contact your health care provider. Other ways to get help include: ? Talking to friends and family members about your condition. They can give you support and guidance. Explain your symptoms to them and, if comfortable, invite them to attend appointments or rehabilitation with you. ? Joining a support group for people with chronic heart failure. Talking with other people who have the same symptoms may give you new ways of coping with your disease and your emotions.  Stay up to date with your shots (vaccines). Staying current on pneumococcal and influenza vaccines is especially important in preventing germs from attacking your airways (respiratory infections).  Keep all follow-up visits as told by your  health care provider. This is important. How to recognize changes in your condition You and your family members need to know what changes to watch for in your condition. Watch for the following changes and report them to your health care provider:  Sudden weight gain. Ask your health care provider what amount of weight gain to report.  Shortness of breath: ? Feeling short of breath while at rest, with no exercise or activity that required great effort. ? Feeling breathless with activity.  Swelling of your lower legs or ankles.  Difficulty sleeping: ? You wake up feeling short of breath. ? You have to use more pillows to raise your head in order to sleep.  Frequent, dry, hacking cough.  Loss of appetite.  Feeling more tired all the time.  Depression or feelings of sadness or hopelessness.  Bloating in the stomach. Where to find more information  Local support groups. Ask your health care provider about groups near you.  The American Heart Association: www.heart.org Contact a health care provider if:  You have a rapid weight gain.  You have increasing shortness of breath that is unusual for you.  You are unable to participate in your usual physical activities.  You tire easily.  You cough more than normal, especially with physical activity.  You have any swelling or more swelling in areas such as your hands, feet, ankles, or abdomen.  You feel like your heart is beating quickly (palpitations).  You become dizzy or light-headed when you stand up. Get help right away if:  You have difficulty breathing.  You notice or your family notices a change in your awareness, such as having trouble staying awake or having difficulty with concentration.  You have pain or discomfort in your chest.  You have an episode of fainting (syncope). Summary  There is no cure for heart failure, so it is important for you to take good care of yourself and follow the treatment plan set  by your health care provider.  Medicines are important in reducing your heart's workload, slowing the progression of heart failure, and improving your symptoms.  Living with chronic heart failure often leads to emotions such as fear, stress, anxiety, and depression. If you are feeling any of these emotions and need help coping, contact your health care provider. This information is not intended to replace advice given to you by your health care provider. Make sure you discuss any questions you have with your health care provider. Document Revised: 09/11/2017 Document Reviewed: 02/11/2017 Elsevier Patient Education  2020 [employer].  Managing Your Hypertension Hypertension is commonly called high blood pressure. This is when the force of your blood pressing against the walls  of your arteries is too strong. Arteries are blood vessels that carry blood from your heart throughout your body. Hypertension forces the heart to work harder to pump blood, and may cause the arteries to become narrow or stiff. Having untreated or uncontrolled hypertension can cause heart attack, stroke, kidney disease, and other problems. What are blood pressure readings? A blood pressure reading consists of a higher number over a lower number. Ideally, your blood pressure should be below 120/80. The first ("top") number is called the systolic pressure. It is a measure of the pressure in your arteries as your heart beats. The second ("bottom") number is called the diastolic pressure. It is a measure of the pressure in your arteries as the heart relaxes. What does my blood pressure reading mean? Blood pressure is classified into four stages. Based on your blood pressure reading, your health care provider may use the following stages to determine what type of treatment you need, if any. Systolic pressure and diastolic pressure are measured in a unit called mm Hg. Normal  Systolic pressure: below 924.  Diastolic pressure:  below 80. Elevated  Systolic pressure: 268-341.  Diastolic pressure: below 80. Hypertension stage 1  Systolic pressure: 962-229.  Diastolic pressure: 79-89. Hypertension stage 2  Systolic pressure: 211 or above.  Diastolic pressure: 90 or above. What health risks are associated with hypertension? Managing your hypertension is an important responsibility. Uncontrolled hypertension can lead to:  A heart attack.  A stroke.  A weakened blood vessel (aneurysm).  Heart failure.  Kidney damage.  Eye damage.  Metabolic syndrome.  Memory and concentration problems. What changes can I make to manage my hypertension? Hypertension can be managed by making lifestyle changes and possibly by taking medicines. Your health care provider will help you make a plan to bring your blood pressure within a normal range. Eating and drinking   Eat a diet that is high in fiber and potassium, and low in salt (sodium), added sugar, and fat. An example eating plan is called the DASH (Dietary Approaches to Stop Hypertension) diet. To eat this way: ? Eat plenty of fresh fruits and vegetables. Try to fill half of your plate at each meal with fruits and vegetables. ? Eat whole grains, such as whole wheat pasta, brown rice, or whole grain bread. Fill about one quarter of your plate with whole grains. ? Eat low-fat diary products. ? Avoid fatty cuts of meat, processed or cured meats, and poultry with skin. Fill about one quarter of your plate with lean proteins such as fish, chicken without skin, beans, eggs, and tofu. ? Avoid premade and processed foods. These tend to be higher in sodium, added sugar, and fat.  Reduce your daily sodium intake. Most people with hypertension should eat less than 1,500 mg of sodium a day.  Limit alcohol intake to no more than 1 drink a day for nonpregnant women and 2 drinks a day for men. One drink equals 12 oz of beer, 5 oz of wine, or 1 oz of hard  liquor. Lifestyle  Work with your health care provider to maintain a healthy body weight, or to lose weight. Ask what an ideal weight is for you.  Get at least 30 minutes of exercise that causes your heart to beat faster (aerobic exercise) most days of the week. Activities may include walking, swimming, or biking.  Include exercise to strengthen your muscles (resistance exercise), such as weight lifting, as part of your weekly exercise routine. Try to do these  types of exercises for 30 minutes at least 3 days a week.  Do not use any products that contain nicotine or tobacco, such as cigarettes and e-cigarettes. If you need help quitting, ask your health care provider.  Control any long-term (chronic) conditions you have, such as high cholesterol or diabetes. Monitoring  Monitor your blood pressure at home as told by your health care provider. Your personal target blood pressure may vary depending on your medical conditions, your age, and other factors.  Have your blood pressure checked regularly, as often as told by your health care provider. Working with your health care provider  Review all the medicines you take with your health care provider because there may be side effects or interactions.  Talk with your health care provider about your diet, exercise habits, and other lifestyle factors that may be contributing to hypertension.  Visit your health care provider regularly. Your health care provider can help you create and adjust your plan for managing hypertension. Will I need medicine to control my blood pressure? Your health care provider may prescribe medicine if lifestyle changes are not enough to get your blood pressure under control, and if:  Your systolic blood pressure is 130 or higher.  Your diastolic blood pressure is 80 or higher. Take medicines only as told by your health care provider. Follow the directions carefully. Blood pressure medicines must be taken as  prescribed. The medicine does not work as well when you skip doses. Skipping doses also puts you at risk for problems. Contact a health care provider if:  You think you are having a reaction to medicines you have taken.  You have repeated (recurrent) headaches.  You feel dizzy.  You have swelling in your ankles.  You have trouble with your vision. Get help right away if:  You develop a severe headache or confusion.  You have unusual weakness or numbness, or you feel faint.  You have severe pain in your chest or abdomen.  You vomit repeatedly.  You have trouble breathing. Summary  Hypertension is when the force of blood pumping through your arteries is too strong. If this condition is not controlled, it may put you at risk for serious complications.  Your personal target blood pressure may vary depending on your medical conditions, your age, and other factors. For most people, a normal blood pressure is less than 120/80.  Hypertension is managed by lifestyle changes, medicines, or both. Lifestyle changes include weight loss, eating a healthy, low-sodium diet, exercising more, and limiting alcohol. This information is not intended to replace advice given to you by your health care provider. Make sure you discuss any questions you have with your health care provider. Document Revised: 01/21/2019 Document Reviewed: 08/27/2016 Elsevier Patient Education  2020 [place].  Diabetes Basics  Diabetes (diabetes mellitus) is a long-term (chronic) disease. It occurs when the body does not properly use sugar (glucose) that is released from food after you eat. Diabetes may be caused by one or both of these problems:  Your pancreas does not make enough of a hormone called insulin.  Your body does not react in a normal way to insulin that it makes. Insulin lets sugars (glucose) go into cells in your body. This gives you energy. If you have diabetes, sugars cannot get into cells. This  causes high blood sugar (hyperglycemia). Follow these instructions at home: How is diabetes treated? You may need to take insulin or other diabetes medicines daily to keep your blood sugar in  balance. Take your diabetes medicines every day as told by your doctor. List your diabetes medicines here: Diabetes medicines  Name of medicine: ______________________________ ? Amount (dose): _______________ Time (a.m./p.m.): _______________ Notes: ___________________________________  Name of medicine: ______________________________ ? Amount (dose): _______________ Time (a.m./p.m.): _______________ Notes: ___________________________________  Name of medicine: ______________________________ ? Amount (dose): _______________ Time (a.m./p.m.): _______________ Notes: ___________________________________ If you use insulin, you will learn how to give yourself insulin by injection. You may need to adjust the amount based on the food that you eat. List the types of insulin you use here: Insulin  Insulin type: ______________________________ ? Amount (dose): _______________ Time (a.m./p.m.): _______________ Notes: ___________________________________  Insulin type: ______________________________ ? Amount (dose): _______________ Time (a.m./p.m.): _______________ Notes: ___________________________________  Insulin type: ______________________________ ? Amount (dose): _______________ Time (a.m./p.m.): _______________ Notes: ___________________________________  Insulin type: ______________________________ ? Amount (dose): _______________ Time (a.m./p.m.): _______________ Notes: ___________________________________  Insulin type: ______________________________ ? Amount (dose): _______________ Time (a.m./p.m.): _______________ Notes: ___________________________________ How do I manage my blood sugar?  Check your blood sugar levels using a blood glucose monitor as directed by your doctor. Your doctor will set  treatment goals for you. Generally, you should have these blood sugar levels:  Before meals (preprandial): 80-130 mg/dL (4.4-7.2 mmol/L).  After meals (postprandial): below 180 mg/dL (10 mmol/L).  A1c level: less than 7%. Write down the times that you will check your blood sugar levels: Blood sugar checks  Time: _______________ Notes: ___________________________________  Time: _______________ Notes: ___________________________________  Time: _______________ Notes: ___________________________________  Time: _______________ Notes: ___________________________________  Time: _______________ Notes: ___________________________________  Time: _______________ Notes: ___________________________________  What do I need to know about low blood sugar? Low blood sugar is called hypoglycemia. This is when blood sugar is at or below 70 mg/dL (3.9 mmol/L). Symptoms may include:  Feeling: ? Hungry. ? Worried or nervous (anxious). ? Sweaty and clammy. ? Confused. ? Dizzy. ? Sleepy. ? Sick to your stomach (nauseous).  Having: ? A fast heartbeat. ? A headache. ? A change in your vision. ? Tingling or no feeling (numbness) around the mouth, lips, or tongue. ? Jerky movements that you cannot control (seizure).  Having trouble with: ? Moving (coordination). ? Sleeping. ? Passing out (fainting). ? Getting upset easily (irritability). Treating low blood sugar To treat low blood sugar, eat or drink something sugary right away. If you can think clearly and swallow safely, follow the 15:15 rule:  Take 15 grams of a fast-acting carb (carbohydrate). Talk with your doctor about how much you should take.  Some fast-acting carbs are: ? Sugar tablets (glucose pills). Take 3-4 glucose pills. ? 6-8 pieces of hard candy. ? 4-6 oz (120-150 mL) of fruit juice. ? 4-6 oz (120-150 mL) of regular (not diet) soda. ? 1 Tbsp (15 mL) honey or sugar.  Check your blood sugar 15 minutes after you take the  carb.  If your blood sugar is still at or below 70 mg/dL (3.9 mmol/L), take 15 grams of a carb again.  If your blood sugar does not go above 70 mg/dL (3.9 mmol/L) after 3 tries, get help right away.  After your blood sugar goes back to normal, eat a meal or a snack within 1 hour. Treating very low blood sugar If your blood sugar is at or below 54 mg/dL (3 mmol/L), you have very low blood sugar (severe hypoglycemia). This is an emergency. Do not wait to see if the symptoms will go away. Get medical help right away. Call your local emergency services (911 in the  [place] Do not drive yourself to the hospital. Questions to ask your health care provider  Do I need to meet with a diabetes educator?  What equipment will I need to care for myself at home?  What diabetes medicines do I need? When should I take them?  How often do I need to check my blood sugar?  What number can I call if I have questions?  When is my next doctor's visit?  Where can I find a support group for people with diabetes? Where to find more information  American Diabetes Association: www.diabetes.org  American Association of Diabetes Educators: www.diabeteseducator.org/patient-resources Contact a doctor if:  Your blood sugar is at or above 240 mg/dL (13.3 mmol/L) for 2 days in a row.  You have been sick or have had a fever for 2 days or more, and you are not getting better.  You have any of these problems for more than 6 hours: ? You cannot eat or drink. ? You feel sick to your stomach (nauseous). ? You throw up (vomit). ? You have watery poop (diarrhea). Get help right away if:  Your blood sugar is lower than 54 mg/dL (3 mmol/L).  You get confused.  You have trouble: ? Thinking clearly. ? Breathing. Summary  Diabetes (diabetes mellitus) is a long-term (chronic) disease. It occurs when the body does not properly use sugar (glucose) that is released from food after digestion.  Take insulin and  diabetes medicines as told.  Check your blood sugar every day, as often as told.  Keep all follow-up visits as told by your doctor. This is important. This information is not intended to replace advice given to you by your health care provider. Make sure you discuss any questions you have with your health care provider. Document Revised: 06/22/2019 Document Reviewed: 01/01/2018 Elsevier Patient Education  [place].

## 2020-06-21 NOTE — Patient Outreach (Signed)
[facility]) Care Management  06/21/2020  Beverly Martin 02-Nov-1955 [id number]   New referral:   MD office does Transition of care. Diagnosis: new onset of CHF Admission date 8/28-9/7  19.1 liters dieursed.  BNP:  3500 DM  A1c of 8.2   Placed call to patient after review of chart.  No answer. Left a message requesting a call back.  PLAN: will mail an unsuccessful outreach letter and call back in 3-4 business days.  Tomasa Rand, RN, BSN, CEN [facility] [employer] [phone]

## 2020-06-22 LAB — BASIC METABOLIC PANEL WITH GFR
BUN/Creatinine Ratio: 21 (calc) (ref 6–22)
BUN: 69 mg/dL — ABNORMAL HIGH (ref 7–25)
CO2: 34 mmol/L — ABNORMAL HIGH (ref 20–32)
Calcium: 9.6 mg/dL (ref 8.6–10.4)
Chloride: 101 mmol/L (ref 98–110)
Creat: 3.31 mg/dL — ABNORMAL HIGH (ref 0.50–0.99)
GFR, Est African American: 16 mL/min/{1.73_m2} — ABNORMAL LOW (ref >=60)
GFR, Est Non African American: 14 mL/min/{1.73_m2} — ABNORMAL LOW (ref >=60)
Glucose, Bld: 102 mg/dL — ABNORMAL HIGH (ref 65–99)
Potassium: 5.2 mmol/L (ref 3.5–5.3)
Sodium: 142 mmol/L (ref 135–146)

## 2020-06-22 LAB — BRAIN NATRIURETIC PEPTIDE: Brain Natriuretic Peptide: 1370 pg/mL — ABNORMAL HIGH (ref <100)

## 2020-06-25 ENCOUNTER — Other Ambulatory Visit: Payer: 59 | Admitting: *Deleted

## 2020-06-25 ENCOUNTER — Other Ambulatory Visit: Payer: Self-pay

## 2020-06-25 DIAGNOSIS — I5033 Acute on chronic diastolic (congestive) heart failure: Secondary | ICD-10-CM

## 2020-06-25 LAB — BASIC METABOLIC PANEL
BUN/Creatinine Ratio: 22 (ref 12–28)
BUN: 71 mg/dL — ABNORMAL HIGH (ref 8–27)
CO2: 28 mmol/L (ref 20–29)
Calcium: 9.2 mg/dL (ref 8.7–10.3)
Chloride: 97 mmol/L (ref 96–106)
Creatinine, Ser: 3.27 mg/dL — ABNORMAL HIGH (ref 0.57–1.00)
GFR calc Af Amer: 16 mL/min/{1.73_m2} — ABNORMAL LOW (ref >59)
GFR calc non Af Amer: 14 mL/min/{1.73_m2} — ABNORMAL LOW (ref >59)
Glucose: 136 mg/dL — ABNORMAL HIGH (ref 65–99)
Potassium: 5 mmol/L (ref 3.5–5.2)
Sodium: 140 mmol/L (ref 134–144)

## 2020-06-26 ENCOUNTER — Other Ambulatory Visit: Payer: Self-pay

## 2020-06-26 NOTE — Patient Outreach (Signed)
[facility]) Care Management  06/26/2020  Beverly Martin 05-27-1956 [id number]    Telephone assessment:  Placed call to patient with no answer.  Left a message requesting a call back.  PLAN: will call back in 3-4 days. Letter already mailed.   Tomasa Rand, RN, BSN, CEN [facility] [employer] [phone]

## 2020-06-27 ENCOUNTER — Telehealth: Payer: Self-pay | Admitting: *Deleted

## 2020-06-27 NOTE — Telephone Encounter (Signed)
-----   Message from Lelon Perla, MD sent at 06/25/2020  4:54 PM EDT ----- Make sure pt has fu with nephrology Kirk Ruths

## 2020-06-27 NOTE — Telephone Encounter (Signed)
Left message to call back  

## 2020-06-29 ENCOUNTER — Encounter: Payer: Self-pay | Admitting: Family Medicine

## 2020-06-29 ENCOUNTER — Other Ambulatory Visit: Payer: Self-pay

## 2020-06-29 DIAGNOSIS — I5021 Acute systolic (congestive) heart failure: Secondary | ICD-10-CM | POA: Insufficient documentation

## 2020-06-29 NOTE — Patient Outreach (Signed)
[facility]) Care Management  06/29/2020  Beverly Martin Oct 12, 1956 [id number]   Telephone assessment:Placed call to patient for telephone assessment of needs for heart failure. No answer. Left a message.  PLAN: will attempt again in 4 weeks.  This was the 3rd call and no response as of yet to letter outreach.  Tomasa Rand, RN, BSN, CEN [facility] [employer] [phone]

## 2020-06-29 NOTE — Telephone Encounter (Signed)
Leave message to call back

## 2020-07-04 ENCOUNTER — Encounter: Payer: Self-pay | Admitting: *Deleted

## 2020-07-08 NOTE — Progress Notes (Deleted)
Cardiology Office Note:    Date:  07/08/2020   ID:  Beverly Martin, DOB 1956-10-04, MRN [id number]  PCP:  Billie Ruddy, MD  [place] Cardiologist:  No primary care provider on file. *** [facility] Electrophysiologist:  None   Referring MD: Billie Ruddy, MD   Chief Complaint:  No chief complaint on file.    Patient Profile:    Beverly Martin is a 64 y.o. female with:   Coronary artery disease  S/p CABG  Carotid artery Dz  [place] 9/21: R 60-79; L 40-59  Hypertension   Hyperlipidemia   Diastolic CHF  Echocardiogram 1/19: EF 50-55  Echocardiogram 8/21: EF 45-50, Gr 2 DD  Diabetes mellitus   Chronic kidney disease   Prior CV studies: Carotid US 06/18/20 R 60-79; L 40-59  Echocardiogram 06/10/20 EF 45-50, Gr 2 DD, global HK, normal RVSF, mild MR  Echocardiogram 10/21/17 EF 50-55  History of Present Illness:    [name] was admitted 8/28-9/7 with decompensated HF with anasarca in the setting of hypertensive urgency and AKI on chronic kidney disease.  She has not had regular medical follow up.  She diuresed 16 L.  An echocardiogram demonstrated mildly reduced LVF with EF 45-50%.  She was followed by Cardiology.  She was not felt to be a candidate for cath given her chronic kidney disease.  She would need follow up with Nephrology as an OP.  she was noted to have mod carotid artery dz on [place] and would need follow up in 1 year.  She returns for follow up.  ***      Past Medical History:  Diagnosis Date   CAD (coronary artery disease)    Diabetes mellitus without complication ([place])    Essential hypertension    Hyperlipidemia     Current Medications: No outpatient medications have been marked as taking for the 07/09/20 encounter (Appointment) with Richardson Dopp T, PA-C.     Allergies:   Patient has no known allergies.   Social History   Tobacco Use   Smoking status: Never Smoker   Smokeless tobacco: Never Used  Vaping Use   Vaping Use:  Never used  Substance Use Topics   Alcohol use: No   Drug use: No     Family Hx: The patient's family history includes Diabetes Mellitus II in her mother; Esophageal cancer in her father.  ROS   EKGs/Labs/Other Test Reviewed:    EKG:  EKG is *** ordered today.  The ekg ordered today demonstrates ***  Recent Labs: 06/09/2020: ALT 16 06/10/2020: TSH 2.053 06/14/2020: Hemoglobin 10.8; Platelets 169 06/21/2020: Brain Natriuretic Peptide 1,370 06/25/2020: BUN 71; Creatinine, Ser 3.27; Potassium 5.0; Sodium 140   Recent Lipid Panel Lab Results  Component Value Date/Time   CHOL 245 (H) 06/10/2020 03:56 PM   TRIG 93 06/10/2020 03:56 PM   HDL 69 06/10/2020 03:56 PM   CHOLHDL 3.6 06/10/2020 03:56 PM   LDLCALC 157 (H) 06/10/2020 03:56 PM    Physical Exam:    VS:  There were no vitals taken for this visit.    Wt Readings from Last 3 Encounters:  06/21/20 125 lb (56.7 kg)  06/19/20 128 lb 1.6 oz (58.1 kg)  06/09/18 132 lb (59.9 kg)     Physical Exam ***  ASSESSMENT & PLAN:    ***  Dispo:  No follow-ups on file.   Medication Adjustments/Labs and Tests Ordered: Current medicines are reviewed at length with the patient today.  Concerns regarding  medicines are outlined above.  Tests Ordered: No orders of the defined types were placed in this encounter.  Medication Changes: No orders of the defined types were placed in this encounter.   Signed, Richardson Dopp, PA-C  07/08/2020 [facility] [address] [place], [place], [place]  [address] Phone: [phone]; Fax: [phone]

## 2020-07-09 ENCOUNTER — Ambulatory Visit: Payer: 59 | Admitting: Physician Assistant

## 2020-07-11 ENCOUNTER — Ambulatory Visit: Payer: 59

## 2020-07-11 ENCOUNTER — Other Ambulatory Visit: Payer: Self-pay

## 2020-07-11 ENCOUNTER — Ambulatory Visit (INDEPENDENT_AMBULATORY_CARE_PROVIDER_SITE_OTHER): Payer: 59 | Admitting: Family Medicine

## 2020-07-11 ENCOUNTER — Encounter: Payer: Self-pay | Admitting: Family Medicine

## 2020-07-11 ENCOUNTER — Other Ambulatory Visit: Payer: 59

## 2020-07-11 VITALS — BP 178/86 | HR 80 | Temp 98.2°F | Wt 126.0 lb

## 2020-07-11 DIAGNOSIS — E1169 Type 2 diabetes mellitus with other specified complication: Secondary | ICD-10-CM | POA: Diagnosis not present

## 2020-07-11 DIAGNOSIS — E785 Hyperlipidemia, unspecified: Secondary | ICD-10-CM

## 2020-07-11 DIAGNOSIS — I5022 Chronic systolic (congestive) heart failure: Secondary | ICD-10-CM

## 2020-07-11 DIAGNOSIS — N184 Chronic kidney disease, stage 4 (severe): Secondary | ICD-10-CM

## 2020-07-11 DIAGNOSIS — I10 Essential (primary) hypertension: Secondary | ICD-10-CM

## 2020-07-11 MED ORDER — BLOOD PRESSURE KIT: PACK | 0 refills | Status: DC

## 2020-07-11 NOTE — Patient Instructions (Signed)
Chronic Kidney Disease, Adult Chronic kidney disease (CKD) happens when the kidneys are damaged over a long period of time. The kidneys are two organs that help with:  Getting rid of waste and extra fluid from the blood.  Making hormones that maintain the amount of fluid in your tissues and blood vessels.  Making sure that the body has the right amount of fluids and chemicals. Most of the time, CKD does not go away, but it can usually be controlled. Steps must be taken to slow down the kidney damage or to stop it from getting worse. If this is not done, the kidneys may stop working. Follow these instructions at home: Medicines  Take over-the-counter and prescription medicines only as told by your doctor. You may need to change the amount of medicines you take.  Do not take any new medicines unless your doctor says it is okay. Many medicines can make your kidney damage worse.  Do not take any vitamin and supplements unless your doctor says it is okay. Many vitamins and supplements can make your kidney damage worse. General instructions  Follow a diet as told by your doctor. You may need to stay away from: ? Alcohol. ? Salty foods. ? Foods that are high in:  Potassium.  Calcium.  Protein.  Do not use any products that contain nicotine or tobacco, such as cigarettes and e-cigarettes. If you need help quitting, ask your doctor.  Keep track of your blood pressure at home. Tell your doctor about any changes.  If you have diabetes, keep track of your blood sugar as told by your doctor.  Try to stay at a healthy weight. If you need help, ask your doctor.  Exercise at least 30 minutes a day, 5 days a week.  Stay up-to-date with your shots (immunizations) as told by your doctor.  Keep all follow-up visits as told by your doctor. This is important. Contact a doctor if:  Your symptoms get worse.  You have new symptoms. Get help right away if:  You have symptoms of end-stage  kidney disease. These may include: ? Headaches. ? Numbness in your hands or feet. ? Easy bruising. ? Having hiccups often. ? Chest pain. ? Shortness of breath. ? Stopping of menstrual periods in women.  You have a fever.  You have very little pee (urine).  You have pain or bleeding when you pee. Summary  Chronic kidney disease (CKD) happens when the kidneys are damaged over a long period of time.  Most of the time, this condition does not go away, but it can usually be controlled. Steps must be taken to slow down the kidney damage or to stop it from getting worse.  Treatment may include a combination of medicines and lifestyle changes. This information is not intended to replace advice given to you by your health care provider. Make sure you discuss any questions you have with your health care provider. Document Revised: 09/11/2017 Document Reviewed: 11/03/2016 Elsevier Patient Education  2020 [place].  Heart Failure, Self Care Heart failure is a serious condition. This sheet explains things you need to do to take care of yourself at home. To help you stay as healthy as possible, you may be asked to change your diet, take certain medicines, and make other changes in your life. Your doctor may also give you more specific instructions. If you have problems or questions, call your doctor. What are the risks? Having heart failure makes it more likely for you to have  some problems. These problems can get worse if you do not take good care of yourself. Problems may include:  Blood clotting problems. This may cause a stroke.  Damage to the kidneys, liver, or lungs.  Abnormal heart rhythms. Supplies needed:  Scale for weighing yourself.  Blood pressure monitor.  Notebook.  Medicines. How to care for yourself when you have heart failure Medicines Take over-the-counter and prescription medicines only as told by your doctor. Take your medicines every day.  Do not stop  taking your medicine unless your doctor tells you to do so.  Do not skip any medicines.  Get your prescriptions refilled before you run out of medicine. This is important. Eating and drinking   Eat heart-healthy foods. Talk with a diet specialist (dietitian) to create an eating plan.  Choose foods that: ? Have no trans fat. ? Are low in saturated fat and cholesterol.  Choose healthy foods, such as: ? Fresh or frozen fruits and vegetables. ? Fish. ? Low-fat (lean) meats. ? Legumes, such as beans, peas, and lentils. ? Fat-free or low-fat dairy products. ? Whole-grain foods. ? High-fiber foods.  Limit salt (sodium) if told by your doctor. Ask your diet specialist to tell you which seasonings are healthy for your heart.  Cook in healthy ways instead of frying. Healthy ways of cooking include roasting, grilling, broiling, baking, poaching, steaming, and stir-frying.  Limit how much fluid you drink, if told by your doctor. Alcohol use  Do not drink alcohol if: ? Your doctor tells you not to drink. ? Your heart was damaged by alcohol, or you have very bad heart failure. ? You are pregnant, may be pregnant, or are planning to become pregnant.  If you drink alcohol: ? Limit how much you use to:  0-1 drink a day for women.  0-2 drinks a day for men. ? Be aware of how much alcohol is in your drink. In the [place] one drink equals one 12 oz bottle of beer (355 mL), one 5 oz glass of wine (148 mL), or one 1 oz glass of hard liquor (44 mL). Lifestyle   Do not use any products that contain nicotine or tobacco, such as cigarettes, e-cigarettes, and chewing tobacco. If you need help quitting, ask your doctor. ? Do not use nicotine gum or patches before talking to your doctor.  Do not use illegal drugs.  Lose weight if told by your doctor.  Do physical activity if told by your doctor. Talk to your doctor before you begin an exercise if: ? You are an older adult. ? You have very bad  heart failure.  Learn to manage stress. If you need help, ask your doctor.  Get rehab (rehabilitation) to help you stay independent and to help with your quality of life.  Plan time to rest when you get tired. Check weight and blood pressure   Weigh yourself every day. This will help you to know if fluid is building up in your body. ? Weigh yourself every morning after you pee (urinate) and before you eat breakfast. ? Wear the same amount of clothing each time. ? Write down your daily weight. Give your record to your doctor.  Check and write down your blood pressure as told by your doctor.  Check your pulse as told by your doctor. Dealing with very hot and very cold weather  If it is very hot: ? Avoid activities that take a lot of energy. ? Use air conditioning or fans, or find  a cooler place. ? Avoid caffeine and alcohol. ? Wear clothing that is loose-fitting, lightweight, and light-colored.  If it is very cold: ? Avoid activities that take a lot of energy. ? Layer your clothes. ? Wear mittens or gloves, a hat, and a scarf when you go outside. ? Avoid alcohol. Follow these instructions at home:  Stay up to date with shots (vaccines). Get pneumococcal and flu (influenza) shots.  Keep all follow-up visits as told by your doctor. This is important. Contact a doctor if:  You gain weight quickly.  You have increasing shortness of breath.  You cannot do your normal activities.  You get tired easily.  You cough a lot.  You don't feel like eating or feel like you may vomit (nauseous).  You become puffy (swell) in your hands, feet, ankles, or belly (abdomen).  You cannot sleep well because it is hard to breathe.  You feel like your heart is beating fast (palpitations).  You get dizzy when you stand up. Get help right away if:  You have trouble breathing.  You or someone else notices a change in your behavior, such as having trouble staying awake.  You have chest  pain or discomfort.  You pass out (faint). These symptoms may be an emergency. Do not wait to see if the symptoms will go away. Get medical help right away. Call your local emergency services (911 in the [place] Do not drive yourself to the hospital. Summary  Heart failure is a serious condition. To care for yourself, you may have to change your diet, take medicines, and make other lifestyle changes.  Take your medicines every day. Do not stop taking them unless your doctor tells you to do so.  Eat heart-healthy foods, such as fresh or frozen fruits and vegetables, fish, lean meats, legumes, fat-free or low-fat dairy products, and whole-grain or high-fiber foods.  Ask your doctor if you can drink alcohol. You may have to stop alcohol use if you have very bad heart failure.  Contact your doctor if you gain weight quickly or feel that your heart is beating too fast. Get help right away if you pass out, or have chest pain or trouble breathing. This information is not intended to replace advice given to you by your health care provider. Make sure you discuss any questions you have with your health care provider. Document Revised: 01/11/2019 Document Reviewed: 01/12/2019 Elsevier Patient Education  [place] Your Hypertension Hypertension is commonly called high blood pressure. This is when the force of your blood pressing against the walls of your arteries is too strong. Arteries are blood vessels that carry blood from your heart throughout your body. Hypertension forces the heart to work harder to pump blood, and may cause the arteries to become narrow or stiff. Having untreated or uncontrolled hypertension can cause heart attack, stroke, kidney disease, and other problems. What are blood pressure readings? A blood pressure reading consists of a higher number over a lower number. Ideally, your blood pressure should be below 120/80. The first ("top") number is called the systolic  pressure. It is a measure of the pressure in your arteries as your heart beats. The second ("bottom") number is called the diastolic pressure. It is a measure of the pressure in your arteries as the heart relaxes. What does my blood pressure reading mean? Blood pressure is classified into four stages. Based on your blood pressure reading, your health care provider may use the following stages to  determine what type of treatment you need, if any. Systolic pressure and diastolic pressure are measured in a unit called mm Hg. Normal  Systolic pressure: below 222.  Diastolic pressure: below 80. Elevated  Systolic pressure: 979-892.  Diastolic pressure: below 80. Hypertension stage 1  Systolic pressure: 119-417.  Diastolic pressure: 40-81. Hypertension stage 2  Systolic pressure: 448 or above.  Diastolic pressure: 90 or above. What health risks are associated with hypertension? Managing your hypertension is an important responsibility. Uncontrolled hypertension can lead to:  A heart attack.  A stroke.  A weakened blood vessel (aneurysm).  Heart failure.  Kidney damage.  Eye damage.  Metabolic syndrome.  Memory and concentration problems. What changes can I make to manage my hypertension? Hypertension can be managed by making lifestyle changes and possibly by taking medicines. Your health care provider will help you make a plan to bring your blood pressure within a normal range. Eating and drinking   Eat a diet that is high in fiber and potassium, and low in salt (sodium), added sugar, and fat. An example eating plan is called the DASH (Dietary Approaches to Stop Hypertension) diet. To eat this way: ? Eat plenty of fresh fruits and vegetables. Try to fill half of your plate at each meal with fruits and vegetables. ? Eat whole grains, such as whole wheat pasta, brown rice, or whole grain bread. Fill about one quarter of your plate with whole grains. ? Eat low-fat diary  products. ? Avoid fatty cuts of meat, processed or cured meats, and poultry with skin. Fill about one quarter of your plate with lean proteins such as fish, chicken without skin, beans, eggs, and tofu. ? Avoid premade and processed foods. These tend to be higher in sodium, added sugar, and fat.  Reduce your daily sodium intake. Most people with hypertension should eat less than 1,500 mg of sodium a day.  Limit alcohol intake to no more than 1 drink a day for nonpregnant women and 2 drinks a day for men. One drink equals 12 oz of beer, 5 oz of wine, or 1 oz of hard liquor. Lifestyle  Work with your health care provider to maintain a healthy body weight, or to lose weight. Ask what an ideal weight is for you.  Get at least 30 minutes of exercise that causes your heart to beat faster (aerobic exercise) most days of the week. Activities may include walking, swimming, or biking.  Include exercise to strengthen your muscles (resistance exercise), such as weight lifting, as part of your weekly exercise routine. Try to do these types of exercises for 30 minutes at least 3 days a week.  Do not use any products that contain nicotine or tobacco, such as cigarettes and e-cigarettes. If you need help quitting, ask your health care provider.  Control any long-term (chronic) conditions you have, such as high cholesterol or diabetes. Monitoring  Monitor your blood pressure at home as told by your health care provider. Your personal target blood pressure may vary depending on your medical conditions, your age, and other factors.  Have your blood pressure checked regularly, as often as told by your health care provider. Working with your health care provider  Review all the medicines you take with your health care provider because there may be side effects or interactions.  Talk with your health care provider about your diet, exercise habits, and other lifestyle factors that may be contributing to  hypertension.  Visit your health care provider regularly. Your  health care provider can help you create and adjust your plan for managing hypertension. Will I need medicine to control my blood pressure? Your health care provider may prescribe medicine if lifestyle changes are not enough to get your blood pressure under control, and if:  Your systolic blood pressure is 130 or higher.  Your diastolic blood pressure is 80 or higher. Take medicines only as told by your health care provider. Follow the directions carefully. Blood pressure medicines must be taken as prescribed. The medicine does not work as well when you skip doses. Skipping doses also puts you at risk for problems. Contact a health care provider if:  You think you are having a reaction to medicines you have taken.  You have repeated (recurrent) headaches.  You feel dizzy.  You have swelling in your ankles.  You have trouble with your vision. Get help right away if:  You develop a severe headache or confusion.  You have unusual weakness or numbness, or you feel faint.  You have severe pain in your chest or abdomen.  You vomit repeatedly.  You have trouble breathing. Summary  Hypertension is when the force of blood pumping through your arteries is too strong. If this condition is not controlled, it may put you at risk for serious complications.  Your personal target blood pressure may vary depending on your medical conditions, your age, and other factors. For most people, a normal blood pressure is less than 120/80.  Hypertension is managed by lifestyle changes, medicines, or both. Lifestyle changes include weight loss, eating a healthy, low-sodium diet, exercising more, and limiting alcohol. This information is not intended to replace advice given to you by your health care provider. Make sure you discuss any questions you have with your health care provider. Document Revised: 01/21/2019 Document Reviewed:  08/27/2016 Elsevier Patient Education  [place].

## 2020-07-11 NOTE — Progress Notes (Signed)
Subjective:    Patient ID: Beverly Martin, female    DOB: 10-04-1956, 64 y.o.   MRN: [id number]  No chief complaint on file. Pt accompanied by her husband.  HPI Pt is a 64 yo female with pmh sig for HTN, CAD, systolic CHF, DM 2, HLD, CKD 4 who was seen for follow-up.  Pt last seen 06/21/2020 for HFU.  Pt has not been checking bp or fsbs at home. Has an accu check guide glucometer.  Still eating about the same.  Denies edema in LEs.    Past Medical History:  Diagnosis Date  . CAD (coronary artery disease)   . Diabetes mellitus without complication ([place])   . Essential hypertension   . Hyperlipidemia     No Known Allergies  ROS General: Denies fever, chills, night sweats, changes in weight, changes in appetite HEENT: Denies headaches, ear pain, changes in vision, rhinorrhea, sore throat CV: Denies CP, palpitations, SOB, orthopnea Pulm: Denies SOB, cough, wheezing GI: Denies abdominal pain, nausea, vomiting, diarrhea, constipation GU: Denies dysuria, hematuria, frequency, vaginal discharge Msk: Denies muscle cramps, joint pains Neuro: Denies weakness, numbness, tingling Skin: Denies rashes, bruising Psych: Denies depression, anxiety, hallucinations    Objective:    Blood pressure (!) 178/86, pulse 80, temperature 98.2 F (36.8 C), temperature source Oral, weight 126 lb (57.2 kg), SpO2 97 %. BP recheck 140/70  Gen. Pleasant, well-nourished, in no distress, normal affect  HEENT: [place]/AT, face symmetric, conjunctiva clear, no scleral icterus, PERRLA, EOMI, nares patent without drainage Lungs: no accessory muscle use, faint wheezes b/l, no rales Cardiovascular: RRR, no m/r/g, 1+ pitting edema in b/l LEs at shins Abdomen: BS present, soft, NT/ND, no hepatosplenomegaly. Musculoskeletal: No deformities, no cyanosis or clubbing, normal tone Neuro:  A&Ox3, CN II-XII intact, normal gait Skin:  Warm, no lesions/ rash   Wt Readings from Last 3 Encounters:  06/21/20 125 lb (56.7 kg)   06/19/20 128 lb 1.6 oz (58.1 kg)  06/09/18 132 lb (59.9 kg)    Lab Results  Component Value Date   WBC 5.0 06/14/2020   HGB 10.8 (L) 06/14/2020   HCT 34.1 (L) 06/14/2020   PLT 169 06/14/2020   GLUCOSE 136 (H) 06/25/2020   CHOL 245 (H) 06/10/2020   TRIG 93 06/10/2020   HDL 69 06/10/2020   LDLCALC 157 (H) 06/10/2020   ALT 16 06/09/2020   AST 12 (L) 06/09/2020   NA 140 06/25/2020   K 5.0 06/25/2020   CL 97 06/25/2020   CREATININE 3.27 (H) 06/25/2020   BUN 71 (H) 06/25/2020   CO2 28 06/25/2020   TSH 2.053 06/10/2020   INR 0.92 10/20/2017   HGBA1C 8.2 (H) 06/10/2020    Assessment/Plan:  Essential hypertension  -elevated -discussed the importance of lifestyle changes and bp control -continue coreg 6.25 mg BID, lasix 80 mg -pt encouraged to obtain bp cuff for home monitoring - Plan: BASIC METABOLIC PANEL WITH GFR, Blood Pressure KIT, BASIC METABOLIC PANEL WITH GFR  Type 2 diabetes mellitus with hyperlipidemia (HCC) -hgb A1C 8.2% on 06/10/20 -Discussed the importance of lifestyle modifications -Patient encouraged to check FSBS regularly at home and keep a log to bring her to clinic -Continue current medications including Amaryl 1 mg -Discussed the need to recheck renal function as may need to adjust medications.  CKD (chronic kidney disease) stage 4, GFR 15-29 ml/min (HCC)  -GFR 16 and creatinine 3.27 on 06/25/2020 -Referral to nephrology previously placed.  Awaiting appointment -We will recheck renal function.  May need  to adjust Lasix dose. -Renally dose medications -Avoid nephrotoxic medications - Plan: BASIC METABOLIC PANEL WITH GFR, BASIC METABOLIC PANEL WITH GFR  Chronic systolic heart failure (HCC) -given wheezing and LE edema will obtain CXR and labs -Diet modifications advised -Patient encouraged to elevate lower extremities when sitting -Patient encouraged to keep follow-up appointments with cardiology -Further recommendations/medication adjustments based on  labs and imaging -Given precautions  - Plan: Brain Natriuretic Peptide, DG Chest 2 View, Brain Natriuretic Peptide  F/u in 4 wks, sooner if needed  Grier Mitts, MD

## 2020-07-12 LAB — BRAIN NATRIURETIC PEPTIDE: Brain Natriuretic Peptide: 1575 pg/mL — ABNORMAL HIGH (ref <100)

## 2020-07-12 LAB — BASIC METABOLIC PANEL WITH GFR
BUN/Creatinine Ratio: 21 (calc) (ref 6–22)
BUN: 59 mg/dL — ABNORMAL HIGH (ref 7–25)
CO2: 36 mmol/L — ABNORMAL HIGH (ref 20–32)
Calcium: 9.8 mg/dL (ref 8.6–10.4)
Chloride: 102 mmol/L (ref 98–110)
Creat: 2.76 mg/dL — ABNORMAL HIGH (ref 0.50–0.99)
GFR, Est African American: 20 mL/min/{1.73_m2} — ABNORMAL LOW (ref >=60)
GFR, Est Non African American: 17 mL/min/{1.73_m2} — ABNORMAL LOW (ref >=60)
Glucose, Bld: 59 mg/dL — ABNORMAL LOW (ref 65–99)
Potassium: 4.6 mmol/L (ref 3.5–5.3)
Sodium: 144 mmol/L (ref 135–146)

## 2020-07-18 ENCOUNTER — Other Ambulatory Visit: Payer: Self-pay

## 2020-07-18 MED ORDER — CALCITRIOL 0.25 MCG PO CAPS: 0.2500 ug | ORAL_CAPSULE | Freq: Every day | ORAL | 1 refills | Status: AC

## 2020-07-19 ENCOUNTER — Other Ambulatory Visit: Payer: Self-pay | Admitting: Family Medicine

## 2020-07-26 ENCOUNTER — Other Ambulatory Visit: Payer: Self-pay

## 2020-07-26 NOTE — Patient Outreach (Signed)
[facility]) Care Management  07/26/2020  ICESS BERTONI 05-21-1956 [id number]   CASE CLOSURE:   Placed 4th call to patient with no answer. No answer from previous outreach attempts. No response to letter mailed.  PLAN: close case as unable to reach.  Tomasa Rand, RN, BSN, CEN [facility] [employer] [phone]

## 2020-08-09 ENCOUNTER — Ambulatory Visit: Payer: 59 | Admitting: Cardiovascular Disease

## 2020-08-13 ENCOUNTER — Other Ambulatory Visit: Payer: Self-pay | Admitting: Family Medicine

## 2020-08-13 NOTE — Telephone Encounter (Signed)
Left message to call back  

## 2020-08-16 NOTE — Telephone Encounter (Signed)
Will route to Luisa Dago RN so she will be aware

## 2020-09-12 ENCOUNTER — Other Ambulatory Visit: Payer: Self-pay | Admitting: Family Medicine

## 2020-09-14 ENCOUNTER — Other Ambulatory Visit: Payer: Self-pay

## 2020-09-14 MED ORDER — CALCITRIOL 0.25 MCG PO CAPS: 0.2500 ug | ORAL_CAPSULE | Freq: Every day | ORAL | 0 refills | Status: DC

## 2020-09-18 ENCOUNTER — Other Ambulatory Visit: Payer: Self-pay | Admitting: Family Medicine

## 2020-09-19 ENCOUNTER — Telehealth: Payer: Self-pay | Admitting: Family Medicine

## 2020-09-19 NOTE — Telephone Encounter (Signed)
Pt is calling in stating that she needs a refill on atorvastatin (LIPITOR) 40 MG   Pharm:  [facility]

## 2020-09-21 ENCOUNTER — Other Ambulatory Visit: Payer: Self-pay

## 2020-09-21 MED ORDER — ATORVASTATIN CALCIUM 40 MG PO TABS: 40.0000 mg | ORAL_TABLET | Freq: Every day | ORAL | 0 refills | Status: DC

## 2020-09-21 NOTE — Telephone Encounter (Signed)
Rx sent 

## 2020-09-21 NOTE — Telephone Encounter (Signed)
Ok to send refill  

## 2020-10-13 ENCOUNTER — Other Ambulatory Visit: Payer: Self-pay | Admitting: Family Medicine

## 2020-10-17 ENCOUNTER — Other Ambulatory Visit: Payer: Self-pay

## 2020-10-17 ENCOUNTER — Other Ambulatory Visit: Payer: Self-pay | Admitting: Family Medicine

## 2020-10-30 ENCOUNTER — Other Ambulatory Visit: Payer: Self-pay | Admitting: Family Medicine

## 2020-11-10 ENCOUNTER — Other Ambulatory Visit: Payer: Self-pay | Admitting: Family Medicine

## 2020-11-14 ENCOUNTER — Other Ambulatory Visit: Payer: Self-pay | Admitting: Family Medicine

## 2020-11-16 ENCOUNTER — Telehealth: Payer: Self-pay

## 2020-11-16 NOTE — Telephone Encounter (Signed)
Pt needs appointment for further refills 

## 2020-11-16 NOTE — Telephone Encounter (Signed)
Spoke with pt husband advise to have pt call the office and schedule a f/u appointment with Dr Volanda Napoleon, state that he would have pt call the office

## 2020-12-06 ENCOUNTER — Other Ambulatory Visit: Payer: Self-pay

## 2020-12-06 ENCOUNTER — Ambulatory Visit: Payer: BC Managed Care – PPO | Admitting: Family Medicine

## 2020-12-06 ENCOUNTER — Encounter: Payer: Self-pay | Admitting: Family Medicine

## 2020-12-06 VITALS — BP 130/80 | HR 74 | Temp 98.0°F | Ht 64.0 in | Wt 142.2 lb

## 2020-12-06 DIAGNOSIS — I10 Essential (primary) hypertension: Secondary | ICD-10-CM | POA: Diagnosis not present

## 2020-12-06 DIAGNOSIS — R600 Localized edema: Secondary | ICD-10-CM

## 2020-12-06 DIAGNOSIS — I5022 Chronic systolic (congestive) heart failure: Secondary | ICD-10-CM

## 2020-12-06 DIAGNOSIS — N184 Chronic kidney disease, stage 4 (severe): Secondary | ICD-10-CM

## 2020-12-06 DIAGNOSIS — E1169 Type 2 diabetes mellitus with other specified complication: Secondary | ICD-10-CM

## 2020-12-06 DIAGNOSIS — E785 Hyperlipidemia, unspecified: Secondary | ICD-10-CM | POA: Diagnosis not present

## 2020-12-06 LAB — CBC WITH DIFFERENTIAL/PLATELET
Basophils Absolute: 0 10*3/uL (ref 0.0–0.1)
Basophils Relative: 0.3 % (ref 0.0–3.0)
Eosinophils Absolute: 0.1 10*3/uL (ref 0.0–0.7)
Eosinophils Relative: 1 % (ref 0.0–5.0)
HCT: 32.3 % — ABNORMAL LOW (ref 36.0–46.0)
Hemoglobin: 10.6 g/dL — ABNORMAL LOW (ref 12.0–15.0)
Lymphocytes Relative: 11.2 % — ABNORMAL LOW (ref 12.0–46.0)
Lymphs Abs: 0.7 10*3/uL (ref 0.7–4.0)
MCHC: 32.9 g/dL (ref 30.0–36.0)
MCV: 96.5 fl (ref 78.0–100.0)
Monocytes Absolute: 0.4 10*3/uL (ref 0.1–1.0)
Monocytes Relative: 7.4 % (ref 3.0–12.0)
Neutro Abs: 4.7 10*3/uL (ref 1.4–7.7)
Neutrophils Relative %: 80.1 % — ABNORMAL HIGH (ref 43.0–77.0)
Platelets: 138 10*3/uL — ABNORMAL LOW (ref 150.0–400.0)
RBC: 3.34 Mil/uL — ABNORMAL LOW (ref 3.87–5.11)
RDW: 14.9 % (ref 11.5–15.5)
WBC: 5.9 10*3/uL (ref 4.0–10.5)

## 2020-12-06 LAB — BASIC METABOLIC PANEL
BUN: 64 mg/dL — ABNORMAL HIGH (ref 6–23)
CO2: 36 mEq/L — ABNORMAL HIGH (ref 19–32)
Calcium: 8.7 mg/dL (ref 8.4–10.5)
Chloride: 104 mEq/L (ref 96–112)
Creatinine, Ser: 3.02 mg/dL — ABNORMAL HIGH (ref 0.40–1.20)
GFR: 15.68 mL/min — ABNORMAL LOW (ref >60.00)
Glucose, Bld: 110 mg/dL — ABNORMAL HIGH (ref 70–99)
Potassium: 4.4 mEq/L (ref 3.5–5.1)
Sodium: 146 mEq/L — ABNORMAL HIGH (ref 135–145)

## 2020-12-06 LAB — HEMOGLOBIN A1C: Hgb A1c MFr Bld: 6.4 % (ref 4.6–6.5)

## 2020-12-06 LAB — MICROALBUMIN / CREATININE URINE RATIO
Creatinine,U: 40.5 mg/dL
Microalb Creat Ratio: 82.2 mg/g — ABNORMAL HIGH (ref 0.0–30.0)
Microalb, Ur: 33.3 mg/dL — ABNORMAL HIGH (ref 0.0–1.9)

## 2020-12-06 LAB — BRAIN NATRIURETIC PEPTIDE: Pro B Natriuretic peptide (BNP): >5000 pg/mL — ABNORMAL HIGH (ref 0.0–100.0)

## 2020-12-06 LAB — POCT GLYCOSYLATED HEMOGLOBIN (HGB A1C): Hemoglobin A1C: 6 % — AB (ref 4.0–5.6)

## 2020-12-06 MED ORDER — CVS LANCETS MICRO THIN 33G MISC: 3 refills | Status: DC

## 2020-12-06 NOTE — Patient Instructions (Signed)
Heart Failure Exacerbation  Heart failure is a condition in which the heart has trouble pumping blood. This may mean that the heart cannot pump enough blood out to the body or that the heart does not fill up with enough blood. When this happens, parts of the body do not get the blood and oxygen they need to function properly. This can cause symptoms such as breathing problems, tiredness (fatigue), swelling, and confusion. Heart failure exacerbation refers to heart failure symptoms that get worse. The symptoms may get worse suddenly or develop slowly over time. Heart failure exacerbation is a serious medical problem that should be treated right away. What are the causes? A heart failure exacerbation can be triggered by:  Not taking your heart failure medicines correctly.  Infections.  Eating an unhealthy diet or a diet that is high in salt (sodium).  Drinking too much fluid.  Drinking alcohol.  Using drugs, such as cocaine or methamphetamine.  Not exercising. Other causes include:  Other heart conditions such as an irregular heart rhythm (arrhythmia).  Worsening heart valve function.  Low blood counts (anemia).  Other medical problems, such as kidney failure, thyroid problems, or diabetes mellitus. Sometimes the cause of the exacerbation is not known. What are the signs or symptoms? When heart failure symptoms suddenly or slowly get worse, this may be a sign of heart failure exacerbation. Symptoms of heart failure include:  Shortness of breath during activity or exercise.  A cough that does not go away.  Swelling of the legs, ankles, feet, or abdomen.  Losing or gaining weight for no reason.  Trouble breathing when lying down.  Increased heart rate or irregular heartbeat.  Fatigue.  Feeling light-headed, dizzy, or close to fainting.  Nausea or lack of appetite. How is this diagnosed? This condition is diagnosed based on:  Your symptoms and medical history.  A  physical exam. You may also have tests, including:  Electrocardiogram (ECG). This test measures the electrical activity of your heart.  Echocardiogram. This test uses sound waves to take a picture of your heart to see how well it works.  Blood tests.  Imaging tests, such as: ? Chest X-ray. ? MRI. ? Ultrasound.  Stress test. This test examines how well your heart functions while you exercise on a treadmill or exercise bike. If you cannot exercise, medicines may be used to increase your heartbeat in place of exercise.  Cardiac catheterization. During this test, a thin, flexible tube (catheter) is inserted into a blood vessel and threaded up to your heart. This test allows your health care provider to check the arteries that lead to your heart (coronary arteries).  Right heart catheterization. During this test, the pressure in your heart is measured. How is this treated? This condition may be treated by:  Adjusting your heart medicines.  Maintaining a healthy lifestyle. This includes: ? Eating a heart-healthy diet that is low in sodium. ? Not using products that contain nicotine or tobacco. ? Regular exercise. ? Monitoring your fluid intake. ? Monitoring your weight and reporting changes to your health care provider. ? Not using alcohol or drugs.  Treating sleep apnea, if you have this condition.  Surgery. This may include: ? Placing a pacemaker to improve heart function (cardiac resynchronization therapy). ? Implanting a device that can correct heart rhythm problems (implantable cardioverter defibrillator). ? Implanting a pulmonary arterial pressure monitor to monitor your fluid balance. ? Connecting a device to your heart to help it pump blood (ventricular assist device). ?  Heart transplant. Follow these instructions at home: Medicines  Take over-the-counter and prescription medicines only as told by your health care provider.  Do not stop taking your medicines or change  the amount you take. If you are having problems or side effects from your medicines, talk to your health care provider.  If you are having difficulty paying for your medicines, contact a social worker or your clinic. There are many programs to assist with medicine costs.  Talk to your health care provider before starting any new medicines or supplements.  Make sure your health care provider and pharmacist have a list of all the medicines you are taking. Eating and drinking  Avoid drinking alcohol.  Eat a heart-healthy diet as told by your health care provider. This includes: ? Plenty of fruits and vegetables. ? Lean proteins. ? Low-fat dairy. ? Whole grains. ? Foods that are low in sodium.   Activity  Exercise regularly as told by your health care provider. Balance exercise with rest.  Ask your health care provider what activities are safe for you. This includes sexual activity, exercise, and daily tasks at home or work.   Lifestyle  Do not use any products that contain nicotine or tobacco. These products include cigarettes, chewing tobacco, and vaping devices, such as e-cigarettes. If you need help quitting, ask your health care provider.  Maintain a healthy weight. Ask your health care provider what weight is healthy for you.  Consider joining a patient support group. This can help with emotional problems you may have, such as stress and anxiety.  Do not use drugs. General instructions  Stay up to date with vaccines. Talk to your health care provider about flu and pneumonia vaccines.  Keep a list of medicines that you are taking. This may help in emergency situations.  Keep all follow-up visits. This is important. Contact a health care provider if:  You have questions about your medicines or you miss a dose.  You feel anxious, depressed, or stressed.  You develop swelling in your feet, ankles, legs, or abdomen.  You develop a cough.  You have a fever.  You have  trouble sleeping.  You gain 2-3 lb (1-1.4 kg) in 24 hours or 5 lb (2.3 kg) in a week. Get help right away if:  You have chest pain or pressure.  You have shortness of breath while resting.  You have severe fatigue.  You are confused.  You have severe dizziness.  You have a rapid or irregular heartbeat.  You have nausea or you vomit.  You have a cough that is worse at night or you cannot lie flat.  You have severe depression or sadness. These symptoms may represent a serious problem that is an emergency. Do not wait to see if the symptoms will go away. Get medical help right away. Call your local emergency services (911 in the [place] Do not drive yourself to the hospital. Summary  When heart failure symptoms get worse, it is called heart failure exacerbation.  Common causes of this condition include taking medicines incorrectly, infections, and drinking alcohol.  This condition may be treated by adjusting medicines, maintaining a healthy lifestyle, or surgery.  Do not stop taking your medicines or change the amount you take. If you are having problems or side effects from your medicines, talk to your health care provider. This information is not intended to replace advice given to you by your health care provider. Make sure you discuss any questions you have with  your health care provider. Document Revised: 04/21/2020 Document Reviewed: 04/21/2020 Elsevier Patient Education  [place].

## 2020-12-06 NOTE — Progress Notes (Signed)
Subjective:    Patient ID: Beverly Martin, female    DOB: 1955/11/28, 65 y.o.   MRN: [id number]  Chief Complaint  Patient presents with  . Follow-up  Patient is accompanied by her husband.  HPI Patient was seen today for f/u. Patient states she has been doing well. Eating better. Trying to avoid salt. Patient states her blood sugars have been good, but she has not been checking them. Patient endorses LE edema x3 weeks that improves in a.m. Patient denies SOB, CP, fatigue. Taking Lasix 80 mg daily.  Patient has not seen cardiology 2/2 a billing issue where she was sent to collections.   Past Medical History:  Diagnosis Date  . CAD (coronary artery disease)   . Diabetes mellitus without complication ([place])   . Essential hypertension   . Hyperlipidemia     No Known Allergies  ROS General: Denies fever, chills, night sweats, changes in weight, changes in appetite HEENT: Denies headaches, ear pain, changes in vision, rhinorrhea, sore throat CV: Denies CP, palpitations, SOB, orthopnea  + LE edema Pulm: Denies SOB, cough, wheezing GI: Denies abdominal pain, nausea, vomiting, diarrhea, constipation GU: Denies dysuria, hematuria, frequency, vaginal discharge Msk: Denies muscle cramps, joint pains Neuro: Denies weakness, numbness, tingling Skin: Denies rashes, bruising Psych: Denies depression, anxiety, hallucinations     Objective:    Blood pressure 130/80, pulse 74, temperature 98 F (36.7 C), temperature source Oral, height '5\' 4"'$  (1.626 m), weight 142 lb 3.2 oz (64.5 kg), SpO2 92 %.  Gen. Pleasant, well-nourished, in no distress, normal affect   HEENT: [place]/AT, face symmetric, conjunctiva clear, no scleral icterus, PERRLA, EOMI, nares patent without drainage Lungs: no accessory muscle use, CTAB, no wheezes or rales Cardiovascular: RRR, no m/r/g, 1+ pitting edema in bilateral LE to knee Musculoskeletal: No deformities, no cyanosis or clubbing, normal tone Neuro:  A&Ox3, CN II-XII  intact, normal gait Skin:  Warm, no lesions/ rash   Wt Readings from Last 3 Encounters:  12/06/20 142 lb 3.2 oz (64.5 kg)  07/11/20 126 lb (57.2 kg)  06/21/20 125 lb (56.7 kg)    Lab Results  Component Value Date   WBC 5.0 06/14/2020   HGB 10.8 (L) 06/14/2020   HCT 34.1 (L) 06/14/2020   PLT 169 06/14/2020   GLUCOSE 59 (L) 07/11/2020   CHOL 245 (H) 06/10/2020   TRIG 93 06/10/2020   HDL 69 06/10/2020   LDLCALC 157 (H) 06/10/2020   ALT 16 06/09/2020   AST 12 (L) 06/09/2020   NA 144 07/11/2020   K 4.6 07/11/2020   CL 102 07/11/2020   CREATININE 2.76 (H) 07/11/2020   BUN 59 (H) 07/11/2020   CO2 36 (H) 07/11/2020   TSH 2.053 06/10/2020   INR 0.92 10/20/2017   HGBA1C 8.2 (H) 06/10/2020    Assessment/Plan: Patient often lost to follow-up. The importance of regular visit strongly encouraged.  Type 2 diabetes mellitus with hyperlipidemia (HCC) -Hemoglobin A1c 8.2% on 06/10/2020 -FSBS 124 in clinic -Continue current medications -Lifestyle modification strongly encouraged including reducing intake of sweets and carbohydrates  - Plan: POCT glycosylated hemoglobin (Hb A1C), Hemoglobin A1c, Microalbumin/Creatinine Ratio, Urine, [facility] Lancets Micro Thin 33G MISC  Chronic systolic heart failure ([place]) -Discussed the importance of lifestyle modifications including reducing sodium intake -Compliance also encouraged -Patient strongly encouraged to follow-up with cardiology. - Plan: Basic metabolic panel, Brain Natriuretic Peptide, CBC with Differential/Platelet  CKD (chronic kidney disease) stage 4, GFR 15-29 ml/min (HCC) -Creatinine 2.76 on 07/11/2020 -Avoid nephrotoxic medicines and  renally dose medications - Plan: CBC with Differential/Platelet  Essential hypertension -Improved -Discussed the importance of lifestyle modifications -Continue five medications including Lasix 80 mg daily, Coreg 6.25 mg twice daily - Plan: Basic metabolic panel  Lower extremity edema -Concern for  CHF exacerbation -Continue Lasix 80 mg daily -Discussed supportive care -Plan: BMP  F/u in 2-4 weeks  Grier Mitts, MD

## 2020-12-07 ENCOUNTER — Encounter: Payer: Self-pay | Admitting: Family Medicine

## 2020-12-07 ENCOUNTER — Telehealth: Payer: Self-pay | Admitting: Family Medicine

## 2020-12-07 NOTE — Telephone Encounter (Signed)
Attempt made to contact patient on in regards to lab results, however she did not answer. BNP elevated greater than 5000 and continued decreased kidney function.  Left VM.

## 2020-12-12 ENCOUNTER — Other Ambulatory Visit: Payer: Self-pay | Admitting: Family Medicine

## 2020-12-18 ENCOUNTER — Telehealth: Payer: Self-pay | Admitting: Family Medicine

## 2020-12-18 NOTE — Telephone Encounter (Signed)
Patient is calling and requesting a refill for furosemide (LASIX) 80 MG tablet and  carvedilol (COREG) 6.25 MG tablet sent to  carvedilol (COREG) 6.25 MG tablet 90 day supply to be sent    [id number]  [address] [place] [address] Phone:  [phone]:  [phone] is [phone]

## 2020-12-27 ENCOUNTER — Other Ambulatory Visit: Payer: Self-pay

## 2020-12-27 MED ORDER — CARVEDILOL 6.25 MG PO TABS: 6.2500 mg | ORAL_TABLET | Freq: Two times a day (BID) | ORAL | 2 refills | Status: DC

## 2020-12-27 MED ORDER — FUROSEMIDE 80 MG PO TABS: 80.0000 mg | ORAL_TABLET | Freq: Every day | ORAL | 2 refills | Status: DC

## 2020-12-27 NOTE — Telephone Encounter (Signed)
Rx sent 

## 2020-12-28 ENCOUNTER — Other Ambulatory Visit: Payer: Self-pay

## 2020-12-28 MED ORDER — FUROSEMIDE 80 MG PO TABS: ORAL_TABLET | ORAL | 2 refills | Status: DC

## 2020-12-28 MED ORDER — CARVEDILOL 6.25 MG PO TABS: ORAL_TABLET | ORAL | 2 refills | Status: DC

## 2021-01-03 ENCOUNTER — Inpatient Hospital Stay (HOSPITAL_COMMUNITY)
Admission: EM | Admit: 2021-01-03 | Discharge: 2021-01-08 | DRG: 291 | Disposition: A | Discharge status: Home / Self Care | Payer: BC Managed Care – PPO | Attending: Internal Medicine | Admitting: Internal Medicine

## 2021-01-03 ENCOUNTER — Emergency Department (HOSPITAL_COMMUNITY): Payer: BC Managed Care – PPO

## 2021-01-03 ENCOUNTER — Ambulatory Visit: Payer: BC Managed Care – PPO | Admitting: Family Medicine

## 2021-01-03 ENCOUNTER — Encounter: Payer: Self-pay | Admitting: Family Medicine

## 2021-01-03 ENCOUNTER — Encounter (HOSPITAL_COMMUNITY): Payer: Self-pay | Admitting: Internal Medicine

## 2021-01-03 ENCOUNTER — Other Ambulatory Visit: Payer: Self-pay

## 2021-01-03 ENCOUNTER — Inpatient Hospital Stay (HOSPITAL_COMMUNITY): Payer: BC Managed Care – PPO

## 2021-01-03 VITALS — BP 150/90 | HR 100 | Temp 98.8°F | Ht 64.0 in | Wt 144.8 lb

## 2021-01-03 DIAGNOSIS — I10 Essential (primary) hypertension: Secondary | ICD-10-CM | POA: Diagnosis present

## 2021-01-03 DIAGNOSIS — I5023 Acute on chronic systolic (congestive) heart failure: Secondary | ICD-10-CM | POA: Diagnosis present

## 2021-01-03 DIAGNOSIS — Z7984 Long term (current) use of oral hypoglycemic drugs: Secondary | ICD-10-CM

## 2021-01-03 DIAGNOSIS — Z20822 Contact with and (suspected) exposure to covid-19: Secondary | ICD-10-CM | POA: Diagnosis not present

## 2021-01-03 DIAGNOSIS — J9811 Atelectasis: Secondary | ICD-10-CM | POA: Diagnosis not present

## 2021-01-03 DIAGNOSIS — E1169 Type 2 diabetes mellitus with other specified complication: Secondary | ICD-10-CM | POA: Diagnosis present

## 2021-01-03 DIAGNOSIS — E1122 Type 2 diabetes mellitus with diabetic chronic kidney disease: Secondary | ICD-10-CM | POA: Diagnosis not present

## 2021-01-03 DIAGNOSIS — R601 Generalized edema: Secondary | ICD-10-CM | POA: Diagnosis not present

## 2021-01-03 DIAGNOSIS — Z833 Family history of diabetes mellitus: Secondary | ICD-10-CM | POA: Diagnosis not present

## 2021-01-03 DIAGNOSIS — N179 Acute kidney failure, unspecified: Secondary | ICD-10-CM | POA: Diagnosis present

## 2021-01-03 DIAGNOSIS — N184 Chronic kidney disease, stage 4 (severe): Secondary | ICD-10-CM | POA: Diagnosis not present

## 2021-01-03 DIAGNOSIS — I130 Hypertensive heart and chronic kidney disease with heart failure and stage 1 through stage 4 chronic kidney disease, or unspecified chronic kidney disease: Principal | ICD-10-CM | POA: Diagnosis present

## 2021-01-03 DIAGNOSIS — I517 Cardiomegaly: Secondary | ICD-10-CM | POA: Diagnosis not present

## 2021-01-03 DIAGNOSIS — I2510 Atherosclerotic heart disease of native coronary artery without angina pectoris: Secondary | ICD-10-CM | POA: Diagnosis present

## 2021-01-03 DIAGNOSIS — Z7982 Long term (current) use of aspirin: Secondary | ICD-10-CM | POA: Diagnosis not present

## 2021-01-03 DIAGNOSIS — R600 Localized edema: Secondary | ICD-10-CM

## 2021-01-03 DIAGNOSIS — E785 Hyperlipidemia, unspecified: Secondary | ICD-10-CM | POA: Diagnosis present

## 2021-01-03 DIAGNOSIS — Z951 Presence of aortocoronary bypass graft: Secondary | ICD-10-CM

## 2021-01-03 DIAGNOSIS — I5021 Acute systolic (congestive) heart failure: Secondary | ICD-10-CM

## 2021-01-03 DIAGNOSIS — I081 Rheumatic disorders of both mitral and tricuspid valves: Secondary | ICD-10-CM | POA: Diagnosis present

## 2021-01-03 DIAGNOSIS — Z23 Encounter for immunization: Secondary | ICD-10-CM

## 2021-01-03 DIAGNOSIS — M7989 Other specified soft tissue disorders: Secondary | ICD-10-CM | POA: Diagnosis not present

## 2021-01-03 DIAGNOSIS — Z79899 Other long term (current) drug therapy: Secondary | ICD-10-CM

## 2021-01-03 DIAGNOSIS — R0602 Shortness of breath: Secondary | ICD-10-CM | POA: Diagnosis not present

## 2021-01-03 DIAGNOSIS — J90 Pleural effusion, not elsewhere classified: Secondary | ICD-10-CM | POA: Diagnosis not present

## 2021-01-03 DIAGNOSIS — I509 Heart failure, unspecified: Secondary | ICD-10-CM

## 2021-01-03 LAB — CBC
HCT: 37.2 % (ref 36.0–46.0)
Hemoglobin: 11.7 g/dL — ABNORMAL LOW (ref 12.0–15.0)
MCH: 32 pg (ref 26.0–34.0)
MCHC: 31.5 g/dL (ref 30.0–36.0)
MCV: 101.6 fL — ABNORMAL HIGH (ref 80.0–100.0)
Platelets: 178 10*3/uL (ref 150–400)
RBC: 3.66 MIL/uL — ABNORMAL LOW (ref 3.87–5.11)
RDW: 14.2 % (ref 11.5–15.5)
WBC: 6.9 10*3/uL (ref 4.0–10.5)
nRBC: 0 % (ref 0.0–0.2)

## 2021-01-03 LAB — ECHOCARDIOGRAM COMPLETE
AR max vel: 1.92 cm2
AV Area VTI: 1.75 cm2
AV Area mean vel: 1.71 cm2
AV Mean grad: 2 mmHg
AV Peak grad: 3.4 mmHg
Ao pk vel: 0.92 m/s
Area-P 1/2: 6.12 cm2
Height: 64 in
MV VTI: 1.26 cm2
S' Lateral: 4.3 cm
Weight: 2316.8 oz

## 2021-01-03 LAB — BASIC METABOLIC PANEL
Anion gap: 6 (ref 5–15)
BUN: 48 mg/dL — ABNORMAL HIGH (ref 8–23)
CO2: 30 mmol/L (ref 22–32)
Calcium: 8.9 mg/dL (ref 8.9–10.3)
Chloride: 107 mmol/L (ref 98–111)
Creatinine, Ser: 2.58 mg/dL — ABNORMAL HIGH (ref 0.44–1.00)
GFR, Estimated: 20 mL/min — ABNORMAL LOW (ref >60)
Glucose, Bld: 78 mg/dL (ref 70–99)
Potassium: 4.8 mmol/L (ref 3.5–5.1)
Sodium: 143 mmol/L (ref 135–145)

## 2021-01-03 LAB — BRAIN NATRIURETIC PEPTIDE: B Natriuretic Peptide: >4500 pg/mL — ABNORMAL HIGH (ref 0.0–100.0)

## 2021-01-03 LAB — RESP PANEL BY RT-PCR (FLU A&B, COVID) ARPGX2
Influenza A by PCR: NEGATIVE
Influenza B by PCR: NEGATIVE
SARS Coronavirus 2 by RT PCR: NEGATIVE

## 2021-01-03 LAB — URINALYSIS, ROUTINE W REFLEX MICROSCOPIC
Bilirubin Urine: NEGATIVE
Glucose, UA: NEGATIVE mg/dL
Ketones, ur: NEGATIVE mg/dL
Nitrite: NEGATIVE
Protein, ur: >=300 mg/dL — AB
Specific Gravity, Urine: 1.018 (ref 1.005–1.030)
pH: 6 (ref 5.0–8.0)

## 2021-01-03 LAB — TROPONIN I (HIGH SENSITIVITY)
Troponin I (High Sensitivity): 98 ng/L — ABNORMAL HIGH (ref <18)
Troponin I (High Sensitivity): 78 ng/L — ABNORMAL HIGH (ref <18)

## 2021-01-03 LAB — HEPATIC FUNCTION PANEL
ALT: 17 U/L (ref 0–44)
AST: 16 U/L (ref 15–41)
Albumin: 3.7 g/dL (ref 3.5–5.0)
Alkaline Phosphatase: 65 U/L (ref 38–126)
Bilirubin, Direct: 0.1 mg/dL (ref 0.0–0.2)
Indirect Bilirubin: 0.7 mg/dL (ref 0.3–0.9)
Total Bilirubin: 0.8 mg/dL (ref 0.3–1.2)
Total Protein: 6.4 g/dL — ABNORMAL LOW (ref 6.5–8.1)

## 2021-01-03 LAB — GLUCOSE, CAPILLARY
Glucose-Capillary: 45 mg/dL — ABNORMAL LOW (ref 70–99)
Glucose-Capillary: 66 mg/dL — ABNORMAL LOW (ref 70–99)
Glucose-Capillary: 119 mg/dL — ABNORMAL HIGH (ref 70–99)
Glucose-Capillary: 162 mg/dL — ABNORMAL HIGH (ref 70–99)

## 2021-01-03 MED ORDER — GLIMEPIRIDE 1 MG PO TABS
1.0000 mg | ORAL_TABLET | Freq: Every day | ORAL | Status: DC
  Administered 2021-01-04: 1 mg via ORAL
  Filled 2021-01-03: qty 1

## 2021-01-03 MED ORDER — CARVEDILOL 6.25 MG PO TABS
6.2500 mg | ORAL_TABLET | Freq: Two times a day (BID) | ORAL | Status: DC
  Administered 2021-01-03 – 2021-01-08 (×10): 6.25 mg via ORAL
  Filled 2021-01-03 (×10): qty 1

## 2021-01-03 MED ORDER — SODIUM CHLORIDE 0.9% FLUSH
3.0000 mL | Freq: Two times a day (BID) | INTRAVENOUS | Status: DC
  Administered 2021-01-03 – 2021-01-08 (×11): 3 mL via INTRAVENOUS

## 2021-01-03 MED ORDER — ONDANSETRON HCL 4 MG/2ML IJ SOLN: 4.0000 mg | Freq: Four times a day (QID) | INTRAMUSCULAR | Status: DC | PRN

## 2021-01-03 MED ORDER — HEPARIN SODIUM (PORCINE) 5000 UNIT/ML IJ SOLN
5000.0000 [IU] | Freq: Three times a day (TID) | INTRAMUSCULAR | Status: DC
  Administered 2021-01-03 – 2021-01-08 (×15): 5000 [IU] via SUBCUTANEOUS
  Filled 2021-01-03 (×15): qty 1

## 2021-01-03 MED ORDER — ATORVASTATIN CALCIUM 40 MG PO TABS
40.0000 mg | ORAL_TABLET | Freq: Every day | ORAL | Status: DC
  Administered 2021-01-04 – 2021-01-08 (×5): 40 mg via ORAL
  Filled 2021-01-03 (×5): qty 1

## 2021-01-03 MED ORDER — FUROSEMIDE 10 MG/ML IJ SOLN
40.0000 mg | Freq: Once | INTRAMUSCULAR | Status: AC
  Administered 2021-01-03: 40 mg via INTRAVENOUS
  Filled 2021-01-03: qty 4

## 2021-01-03 MED ORDER — ACETAMINOPHEN 325 MG PO TABS: 650.0000 mg | ORAL_TABLET | ORAL | Status: DC | PRN

## 2021-01-03 MED ORDER — SODIUM CHLORIDE 0.9 % IV SOLN: 250.0000 mL | INTRAVENOUS | Status: DC | PRN

## 2021-01-03 MED ORDER — HYDRALAZINE HCL 25 MG PO TABS: 25.0000 mg | ORAL_TABLET | Freq: Four times a day (QID) | ORAL | Status: DC | PRN

## 2021-01-03 MED ORDER — SODIUM CHLORIDE 0.9% FLUSH: 3.0000 mL | INTRAVENOUS | Status: DC | PRN

## 2021-01-03 MED ORDER — INSULIN ASPART 100 UNIT/ML ~~LOC~~ SOLN
0.0000 [IU] | Freq: Three times a day (TID) | SUBCUTANEOUS | Status: DC
  Administered 2021-01-05: 1 [IU] via SUBCUTANEOUS
  Administered 2021-01-05: 2 [IU] via SUBCUTANEOUS
  Administered 2021-01-07 – 2021-01-08 (×2): 1 [IU] via SUBCUTANEOUS
  Administered 2021-01-08: 2 [IU] via SUBCUTANEOUS

## 2021-01-03 MED ORDER — PERFLUTREN LIPID MICROSPHERE
1.0000 mL | INTRAVENOUS | Status: AC | PRN
  Administered 2021-01-03: 2 mL via INTRAVENOUS
  Filled 2021-01-03: qty 10

## 2021-01-03 MED ORDER — FUROSEMIDE 10 MG/ML IJ SOLN
40.0000 mg | Freq: Two times a day (BID) | INTRAMUSCULAR | Status: DC
  Administered 2021-01-03 – 2021-01-04 (×2): 40 mg via INTRAVENOUS
  Filled 2021-01-03 (×2): qty 4

## 2021-01-03 MED ORDER — ASPIRIN EC 81 MG PO TBEC
81.0000 mg | DELAYED_RELEASE_TABLET | Freq: Every day | ORAL | Status: DC
  Administered 2021-01-04 – 2021-01-08 (×5): 81 mg via ORAL
  Filled 2021-01-03 (×6): qty 1

## 2021-01-03 NOTE — Progress Notes (Signed)
Subjective:    Patient ID: Beverly Martin, female    DOB: 06/28/1956, 65 y.o.   MRN: [id number]  Chief Complaint  Patient presents with  . Follow-up  Patient accompanied by her husband  HPI Patient is a 65 year old female with past medical history significant for HTN, chronic systolic heart failure, HLD, CAD, type 2 diabetes, CKD 4 was seen today for f/u.  Pt last seen 12/06/20, labs from visit with BNP >5000, however patient was lost to follow-up.  Today patient notes difficulty obtaining Lasix 80 mg and Coreg 6.25 mg from pharmacy despite having refills.  Patient states she was out of meds x 3 wks.  Drinking soda and juice daily.  Has not checked her blood sugar this morning.  Notes continued LE edema at times making it difficult for her to move around at work.  Plans on obtaining TED hose this wknd.  State eating less salt and trying to adhere to recommendations.  Pt mentions plans to retire in Nov 2022.    Past Medical History:  Diagnosis Date  . CAD (coronary artery disease)   . Diabetes mellitus without complication ([place])   . Essential hypertension   . Hyperlipidemia     No Known Allergies  ROS General: Denies fever, chills, night sweats, changes in weight, changes in appetite HEENT: Denies headaches, ear pain, changes in vision, rhinorrhea, sore throat CV: Denies CP, palpitations, orthopnea  + intermittent SOB, LE edema Pulm: Denies SOB, cough, wheezing GI: Denies abdominal pain, nausea, vomiting, diarrhea, constipation GU: Denies dysuria, hematuria, frequency, vaginal discharge Msk: Denies muscle cramps, joint pains   Neuro: Denies weakness, numbness, tingling Skin: Denies rashes, bruising Psych: Denies depression, anxiety, hallucinations     Objective:    Blood pressure (!) 150/90, pulse 100, temperature 98.8 F (37.1 C), temperature source Oral, height '5\' 4"'$  (1.626 m), weight 144 lb 12.8 oz (65.7 kg).  Gen. Pleasant, well-nourished, in no distress, normal affect    HEENT: [place]/AT, face symmetric, conjunctiva clear, no scleral icterus, PERRLA, EOMI, nares patent without drainage Lungs: no accessory muscle use, CTAB, no wheezes or rales Cardiovascular: RRR, no m/r/g, anasarca-2+ pitting edema in bilateral LEs with chronic edema skin changes and trace pitting edema in lower abdomen Abdomen: BS present, soft, trace pitting edema in lower abdomen. Musculoskeletal: B/l LEs increased in size, TTP, with pitting edema and skin changes.  No deformities, no cyanosis or clubbing, normal tone Neuro:  A&Ox3, CN II-XII intact, normal gait Skin:  Warm, dry, intact.  Skin of bilateral LEs tight 2/2 edema with skin changes from chronic edema-erythematous round lesion of right ankle and small vesicle noted.     Wt Readings from Last 3 Encounters:  01/03/21 144 lb 12.8 oz (65.7 kg)  12/06/20 142 lb 3.2 oz (64.5 kg)  07/11/20 126 lb (57.2 kg)    Lab Results  Component Value Date   WBC 5.9 12/06/2020   HGB 10.6 (L) 12/06/2020   HCT 32.3 (L) 12/06/2020   PLT 138.0 (L) 12/06/2020   GLUCOSE 110 (H) 12/06/2020   CHOL 245 (H) 06/10/2020   TRIG 93 06/10/2020   HDL 69 06/10/2020   LDLCALC 157 (H) 06/10/2020   ALT 16 06/09/2020   AST 12 (L) 06/09/2020   NA 146 (H) 12/06/2020   K 4.4 12/06/2020   CL 104 12/06/2020   CREATININE 3.02 (H) 12/06/2020   BUN 64 (H) 12/06/2020   CO2 36 (H) 12/06/2020   TSH 2.053 06/10/2020   INR 0.92 10/20/2017  HGBA1C 6.4 12/06/2020   MICROALBUR 33.3 (H) 12/06/2020    Assessment/Plan:  Acute on chronic systolic heart failure (HCC)  Bilateral lower extremity edema  Essential hypertension -Uncontrolled  CKD (chronic kidney disease) stage 4, GFR 15-29 ml/min (HCC) -Likely multifactorial  Type 2 diabetes mellitus with hyperlipidemia (HCC) -Hemoglobin A1c 6.4% on 12/06/2020 -Microalbumin creatinine ratio elevated at 82.2 -On statin. -Discussed starting ARB -Patient showed how to use new meter  Patient is a 65 year old female  with past medical history significant for systolic CHF, DM 2, HLD, HTN, CKD 4 who presented to clinic for follow-up.  Patient last seen 12/06/2021, at which time labs revealed worsening renal function and BNP greater than 5000, however patient was lost to follow-up.  Pt with anasarca this visit likely worsened by being without Lasix 80 mg and Coreg 3.25 mg twice daily x3 weeks.  Given patient's worsening edema will proceed to nearest emergency department for further evaluation and diuresis.  Education provided to patient and her husband.  Strict precautions given.  F/u as needed  Grier Mitts, MD

## 2021-01-03 NOTE — Patient Instructions (Addendum)
Your BNP level on 12/06/20 was >5000.  Given this and the edema (swelling) in your legs up to your stomach, it is best you proceed to the nearest Emergency Department for further treatment and evaluation of this heart failure exacerbation. Heart Failure Exacerbation  Heart failure is a condition in which the heart has trouble pumping blood. This may mean that the heart cannot pump enough blood out to the body or that the heart does not fill up with enough blood. When this happens, parts of the body do not get the blood and oxygen they need to function properly. This can cause symptoms such as breathing problems, tiredness (fatigue), swelling, and confusion. Heart failure exacerbation refers to heart failure symptoms that get worse. The symptoms may get worse suddenly or develop slowly over time. Heart failure exacerbation is a serious medical problem that should be treated right away. What are the causes? A heart failure exacerbation can be triggered by:  Not taking your heart failure medicines correctly.  Infections.  Eating an unhealthy diet or a diet that is high in salt (sodium).  Drinking too much fluid.  Drinking alcohol.  Using drugs, such as cocaine or methamphetamine.  Not exercising. Other causes include:  Other heart conditions such as an irregular heart rhythm (arrhythmia).  Worsening heart valve function.  Low blood counts (anemia).  Other medical problems, such as kidney failure, thyroid problems, or diabetes mellitus. Sometimes the cause of the exacerbation is not known. What are the signs or symptoms? When heart failure symptoms suddenly or slowly get worse, this may be a sign of heart failure exacerbation. Symptoms of heart failure include:  Shortness of breath during activity or exercise.  A cough that does not go away.  Swelling of the legs, ankles, feet, or abdomen.  Losing or gaining weight for no reason.  Trouble breathing when lying down.  Increased  heart rate or irregular heartbeat.  Fatigue.  Feeling light-headed, dizzy, or close to fainting.  Nausea or lack of appetite. How is this diagnosed? This condition is diagnosed based on:  Your symptoms and medical history.  A physical exam. You may also have tests, including:  Electrocardiogram (ECG). This test measures the electrical activity of your heart.  Echocardiogram. This test uses sound waves to take a picture of your heart to see how well it works.  Blood tests.  Imaging tests, such as: ? Chest X-ray. ? MRI. ? Ultrasound.  Stress test. This test examines how well your heart functions while you exercise on a treadmill or exercise bike. If you cannot exercise, medicines may be used to increase your heartbeat in place of exercise.  Cardiac catheterization. During this test, a thin, flexible tube (catheter) is inserted into a blood vessel and threaded up to your heart. This test allows your health care provider to check the arteries that lead to your heart (coronary arteries).  Right heart catheterization. During this test, the pressure in your heart is measured. How is this treated? This condition may be treated by:  Adjusting your heart medicines.  Maintaining a healthy lifestyle. This includes: ? Eating a heart-healthy diet that is low in sodium. ? Not using products that contain nicotine or tobacco. ? Regular exercise. ? Monitoring your fluid intake. ? Monitoring your weight and reporting changes to your health care provider. ? Not using alcohol or drugs.  Treating sleep apnea, if you have this condition.  Surgery. This may include: ? Placing a pacemaker to improve heart function (cardiac resynchronization therapy). ?  Implanting a device that can correct heart rhythm problems (implantable cardioverter defibrillator). ? Implanting a pulmonary arterial pressure monitor to monitor your fluid balance. ? Connecting a device to your heart to help it pump blood  (ventricular assist device). ? Heart transplant. Follow these instructions at home: Medicines  Take over-the-counter and prescription medicines only as told by your health care provider.  Do not stop taking your medicines or change the amount you take. If you are having problems or side effects from your medicines, talk to your health care provider.  If you are having difficulty paying for your medicines, contact a social worker or your clinic. There are many programs to assist with medicine costs.  Talk to your health care provider before starting any new medicines or supplements.  Make sure your health care provider and pharmacist have a list of all the medicines you are taking. Eating and drinking  Avoid drinking alcohol.  Eat a heart-healthy diet as told by your health care provider. This includes: ? Plenty of fruits and vegetables. ? Lean proteins. ? Low-fat dairy. ? Whole grains. ? Foods that are low in sodium.   Activity  Exercise regularly as told by your health care provider. Balance exercise with rest.  Ask your health care provider what activities are safe for you. This includes sexual activity, exercise, and daily tasks at home or work.   Lifestyle  Do not use any products that contain nicotine or tobacco. These products include cigarettes, chewing tobacco, and vaping devices, such as e-cigarettes. If you need help quitting, ask your health care provider.  Maintain a healthy weight. Ask your health care provider what weight is healthy for you.  Consider joining a patient support group. This can help with emotional problems you may have, such as stress and anxiety.  Do not use drugs. General instructions  Stay up to date with vaccines. Talk to your health care provider about flu and pneumonia vaccines.  Keep a list of medicines that you are taking. This may help in emergency situations.  Keep all follow-up visits. This is important. Contact a health care  provider if:  You have questions about your medicines or you miss a dose.  You feel anxious, depressed, or stressed.  You develop swelling in your feet, ankles, legs, or abdomen.  You develop a cough.  You have a fever.  You have trouble sleeping.  You gain 2-3 lb (1-1.4 kg) in 24 hours or 5 lb (2.3 kg) in a week. Get help right away if:  You have chest pain or pressure.  You have shortness of breath while resting.  You have severe fatigue.  You are confused.  You have severe dizziness.  You have a rapid or irregular heartbeat.  You have nausea or you vomit.  You have a cough that is worse at night or you cannot lie flat.  You have severe depression or sadness. These symptoms may represent a serious problem that is an emergency. Do not wait to see if the symptoms will go away. Get medical help right away. Call your local emergency services (911 in the [place] Do not drive yourself to the hospital. Summary  When heart failure symptoms get worse, it is called heart failure exacerbation.  Common causes of this condition include taking medicines incorrectly, infections, and drinking alcohol.  This condition may be treated by adjusting medicines, maintaining a healthy lifestyle, or surgery.  Do not stop taking your medicines or change the amount you take. If you  are having problems or side effects from your medicines, talk to your health care provider. This information is not intended to replace advice given to you by your health care provider. Make sure you discuss any questions you have with your health care provider. Document Revised: 04/21/2020 Document Reviewed: 04/21/2020 Elsevier Patient Education  [place].

## 2021-01-03 NOTE — Progress Notes (Signed)
Paged Dr. Celesta Gentile. Pt T2DM. There are no active orders for CBG or sliding scale insulin.

## 2021-01-03 NOTE — ED Provider Notes (Signed)
[facility]PART[facility] Provider Note   CSN: [id number] Arrival date & time: 01/03/21  1000     History No chief complaint on file.   Beverly Martin is a 65 y.o. female.  HPI   Patient with significant medical history of hypertension, diabetes type 2, CAD, CABG, chronic bilateral pedal edema ejection fraction 45-50 presents with chief complaint of worsening bilateral leg swelling.  She endorses over the last 2 weeks she has noticed the swelling has progressively gotten worse, she endorses the swelling is up to her mid thigh, she has noted weeping from her legs, with occasional paresthesias.  Patient also endorses that she has been having worsening orthopnea, states that she needs to sleep with pillows behind her back as she feels as if she is suffocating, she does endorse occasional exertional shortness of breath but denies having chest pain, becoming diaphoretic, nausea, vomiting, lightheadedness or dizziness.  Patient denies seeing ulcers on her legs, decreased weakness in the legs, or recent trauma area, she has no history of PEs or DVTs, currently not on hormone therapy.  After reviewing patient's note patient was admitted back in August of last year for anasarca secondary due to CHF and AKI.  She had a 16 L of fluid diuresed off her.  Patient is currently not on diuretics at this time, she denies  alleviating factors.  Patient denies headaches, fevers, chills, shortness breath, chest pain, abdominal pain, nausea, vomiting, diarrhea, urinary symptoms.  Past Medical History:  Diagnosis Date  . CAD (coronary artery disease)   . Diabetes mellitus without complication ([place])   . Essential hypertension   . Hyperlipidemia     Patient Active Problem List   Diagnosis Date Noted  . CHF (congestive heart failure) ([place]) 01/03/2021  . Acute systolic heart failure ([place]) 06/29/2020  . Acute CHF (congestive heart failure) ([place]) 06/10/2020  . Anasarca 06/10/2020  .  Dyslipidemia 06/10/2020  . Positive blood culture 10/21/2017  . UTI (urinary tract infection) 10/20/2017  . Sepsis ([place]) 10/20/2017  . AKI (acute kidney injury) ([place]) 10/20/2017  . Elevated troponin 10/20/2017  . Essential hypertension   . Type 2 diabetes mellitus with hyperlipidemia ([place])   . CAD (coronary artery disease)     Past Surgical History:  Procedure Laterality Date  . CARDIAC SURGERY     CABG     OB History   No obstetric history on file.     Family History  Problem Relation Age of Onset  . Diabetes Mellitus II Mother   . Esophageal cancer Father     Social History   Tobacco Use  . Smoking status: Never Smoker  . Smokeless tobacco: Never Used  Vaping Use  . Vaping Use: Never used  Substance Use Topics  . Alcohol use: No  . Drug use: No    Home Medications Prior to Admission medications   Medication Sig Start Date End Date Taking? Authorizing Provider  aspirin EC 81 MG EC tablet Take 1 tablet (81 mg total) by mouth daily. Swallow whole. 06/19/20 06/14/21  Kayleen Memos, DO  atorvastatin (LIPITOR) 40 MG tablet TAKE 1 TABLET BY MOUTH EVERY DAY 11/16/20   Billie Ruddy, MD  blood glucose meter kit and supplies KIT Dispense based on patient and insurance preference. Use up to four times daily as directed. (FOR ICD-9 250.00, 250.01). 06/19/20   Kayleen Memos, DO  Blood Pressure KIT Use as directed for checking BP daily. 07/11/20   Billie Ruddy,  MD  calcitRIOL (ROCALTROL) 0.25 MCG capsule TAKE 1 CAPSULE BY MOUTH EVERY DAY 12/13/20   Billie Ruddy, MD  carvedilol (COREG) 6.25 MG tablet Take one tablet twice daily. 12/28/20   Billie Ruddy, MD  [employer] Lancets Micro Thin 33G MISC Use as directed for testing blood sugar twice daily, more frequently for hyper or hypoglycemia. 12/06/20   Billie Ruddy, MD  furosemide (LASIX) 80 MG tablet Take one tablet daily. 12/28/20   Billie Ruddy, MD  glimepiride (AMARYL) 1 MG tablet TAKE 1 TABLET (1 MG TOTAL) BY MOUTH DAILY  WITH BREAKFAST. 11/16/20 02/14/21  Billie Ruddy, MD    Allergies    Patient has no known allergies.  Review of Systems   Review of Systems  Constitutional: Positive for fatigue. Negative for chills and fever.  HENT: Negative for congestion and sore throat.   Respiratory: Positive for shortness of breath. Negative for cough.   Cardiovascular: Positive for leg swelling. Negative for chest pain.  Gastrointestinal: Negative for abdominal pain, diarrhea, nausea and vomiting.  Genitourinary: Negative for dysuria and enuresis.  Musculoskeletal: Negative for back pain.  Skin: Negative for rash.  Neurological: Negative for dizziness and headaches.  Hematological: Does not bruise/bleed easily.    Physical Exam Updated Vital Signs BP (!) 176/98   Pulse 89   Temp 98.6 F (37 C)   Resp (!) 22   SpO2 96%   Physical Exam Vitals and nursing note reviewed.  Constitutional:      General: She is not in acute distress.    Appearance: She is not ill-appearing.  HENT:     Head: Normocephalic and atraumatic.     Nose: No congestion.     Mouth/Throat:     Mouth: Mucous membranes are moist.     Pharynx: Oropharynx is clear. No oropharyngeal exudate or posterior oropharyngeal erythema.  Eyes:     Conjunctiva/sclera: Conjunctivae normal.  Cardiovascular:     Rate and Rhythm: Normal rate and regular rhythm.     Pulses: Normal pulses.     Heart sounds: No murmur heard. No friction rub. No gallop.   Pulmonary:     Effort: No respiratory distress.     Breath sounds: Wheezing and rales present. No rhonchi.     Comments: Patient's lung sounds were assessed, she has noted intermittent wheezes in the lower bases bilaterally, Rales heard more predominant on the left lung versus the right in the lower lobes. Abdominal:     Palpations: Abdomen is soft.     Tenderness: There is no abdominal tenderness. There is no right CVA tenderness or left CVA tenderness.  Musculoskeletal:     Right lower leg:  Edema present.     Left lower leg: Edema present.     Comments: Patient's legs were visualized she has noted 2+ pedal edema up to the mid thigh bilaterally, noted chronic lymphedema skin changes noted in the lateral aspects of the ankles, neurovascular fully intact  Skin:    General: Skin is warm and dry.  Neurological:     Mental Status: She is alert.  Psychiatric:        Mood and Affect: Mood normal.     ED Results / Procedures / Treatments   Labs (all labs ordered are listed, but only abnormal results are displayed) Labs Reviewed  BASIC METABOLIC PANEL - Abnormal; Notable for the following components:      Result Value   BUN 48 (*)    Creatinine, Ser 2.58 (*)  GFR, Estimated 20 (*)    All other components within normal limits  CBC - Abnormal; Notable for the following components:   RBC 3.66 (*)    Hemoglobin 11.7 (*)    MCV 101.6 (*)    All other components within normal limits  BRAIN NATRIURETIC PEPTIDE - Abnormal; Notable for the following components:   B Natriuretic Peptide >4,500.0 (*)    All other components within normal limits  HEPATIC FUNCTION PANEL - Abnormal; Notable for the following components:   Total Protein 6.4 (*)    All other components within normal limits  TROPONIN I (HIGH SENSITIVITY) - Abnormal; Notable for the following components:   Troponin I (High Sensitivity) 98 (*)    All other components within normal limits  RESP PANEL BY RT-PCR (FLU A&B, COVID) [id number]  URINALYSIS, ROUTINE W REFLEX MICROSCOPIC  TROPONIN I (HIGH SENSITIVITY)    EKG None  Radiology DG Chest 2 View  Result Date: 01/03/2021 CLINICAL DATA:  Bilateral leg swelling and shortness of breath for a few days EXAM: CHEST - 2 VIEW COMPARISON:  06/09/2020 FINDINGS: Small pleural effusions which are unchanged. Pulmonary vascular congestion. Scarring versus fluid retention at the right minor fissure. Cardiomegaly.  CABG. IMPRESSION: Vascular congestion and small pleural effusions  similar to 2021. Electronically Signed   By: Monte Fantasia M.D.   On: 01/03/2021 11:08    Procedures Procedures   Medications Ordered in ED Medications  furosemide (LASIX) injection 40 mg (has no administration in time range)  aspirin EC tablet 81 mg (has no administration in time range)  atorvastatin (LIPITOR) tablet 40 mg (has no administration in time range)  carvedilol (COREG) tablet 6.25 mg (has no administration in time range)  furosemide (LASIX) injection 40 mg (has no administration in time range)  glimepiride (AMARYL) tablet 1 mg (has no administration in time range)  sodium chloride flush (NS) 0.9 % injection 3 mL (has no administration in time range)  sodium chloride flush (NS) 0.9 % injection 3 mL (has no administration in time range)  0.9 %  sodium chloride infusion (has no administration in time range)  acetaminophen (TYLENOL) tablet 650 mg (has no administration in time range)  ondansetron (ZOFRAN) injection 4 mg (has no administration in time range)  heparin injection 5,000 Units (has no administration in time range)  hydrALAZINE (APRESOLINE) tablet 25 mg (has no administration in time range)    ED Course  I have reviewed the triage vital signs and the nursing notes.  Pertinent labs & imaging results that were available during my care of the patient were reviewed by me and considered in my medical decision making (see chart for details).    MDM Rules/Calculators/A&P                         Initial impression-patient presents with chief complaint of pedal edema.  She is alert, does not appear acute distress, vital signs notable for hypertension systolic 381.  Concern for CHF exacerbation will obtain basic lab work-up, started on Lasix and reassess.  Work-up-CBC shows macrocytic anemia appears to be baseline for patient, BMP shows elevated BUN of 48, creatinine 2.58 appears to be about baseline.  Hepatic function normal.  Initial troponin is 98.  BNP greater than 4500  EKG sinus rhythm without signs of ischemia no ST elevation depression noted.  Chest x-ray shows vascular congestion and small pleural effusion similar to 2021.  Reassessment patient was updated on lab work and imaging,  explained that she is severely fluid overloaded and will benefit from admission for diuresis.  Patient is agreeable to this.  Will consult with hospitalist team for further recommendations.  Consult will consult with hospitalist for admission due to anasarca secondary due to CHF spoke with Dr. Roosevelt Locks of the hospitalist team he has accept the patient and will come down and assess.  Rule out-I have low suspicion for ACS as patient denies chest pain, EKG is without signs of ischemia, first troponin is 98.  I suspect elevated troponin is secondary to AKI, only slightly elevated from her baseline.  low suspicion for PE as patient has low risk factors, there is no unilateral leg swelling, patient denies pleuritic chest pain, history is atypical.  Suspicion for DVT as there is no unilateral leg swelling, no recent travel, injuries, patient with no history of DVTs.  Low suspicion for upper respiratory infection as patient is nontoxic-appearing, vital signs reassuring, chest x-ray negative for signs of infection.  Low suspicion for hypertensive emergency or urgency as there is no signs of organ damage noted on exam within lab work.  Plan-patient will be admitted due to anasarca secondary CHF.  Anticipate patient will need to be diuresed and creatinine monitored closely.     Final Clinical Impression(s) / ED Diagnoses Final diagnoses:  Anasarca  Pedal edema    Rx / DC Orders ED Discharge Orders    None       Aron Baba 01/03/21 1257    Davonna Belling, MD 01/03/21 [phone]

## 2021-01-03 NOTE — Plan of Care (Signed)
  Problem: Elimination: Goal: Will not experience complications related to urinary retention Outcome: Progressing   Problem: Safety: Goal: Ability to remain free from injury will improve Outcome: Progressing   

## 2021-01-03 NOTE — Progress Notes (Signed)
  Echocardiogram 2D Echocardiogram has been performed.  Beverly Martin 01/03/2021, 3:51 PM

## 2021-01-03 NOTE — H&P (Signed)
History and Physical    Beverly Martin FVC:[id number] DOB: 1955-12-03 DOA: 01/03/2021  PCP: Billie Ruddy, MD (Confirm with patient/family/NH records and if not entered, this has to be entered at [facility] point of entry) Patient coming from: Home  I have personally briefly reviewed patient's old medical records in [place]  Chief Complaint: SOB, leg swelling  HPI: Beverly Martin is a 65 y.o. female with medical history significant of chronic systolic CHF (LVEF 45 to 63%, 05/2020), CAD status post CABG, HTN, IIDM, HLD, presented with increasing shortness of breath and leg swelling 1 week.  Patient ran out of Lasix and Coreg 2 weeks ago, [facility] P[facility] and [facility], but unable to get either refilled and ended not taking them for about 2 weeks. For about 1+ week, she started to feel SOB, was exertional, then became more constant, also developed orthopnea for 2 nights. No cough, no fever, no chest pain. She estimated she gained more than 10 lbs compared to January this year. ED Course: Anasarca, blood pressure significantly elevated, x-ray showed congestion in the lungs.  Patient received 40 mg of IV Lasix in the ED.  Review of Systems: As per HPI otherwise 14 point review of systems negative.    Past Medical History:  Diagnosis Date  . CAD (coronary artery disease)   . Diabetes mellitus without complication ([place])   . Essential hypertension   . Hyperlipidemia     Past Surgical History:  Procedure Laterality Date  . CARDIAC SURGERY     CABG     reports that she has never smoked. She has never used smokeless tobacco. She reports that she does not drink alcohol and does not use drugs.  No Known Allergies  Family History  Problem Relation Age of Onset  . Diabetes Mellitus II Mother   . Esophageal cancer Father      Prior to Admission medications   Medication Sig Start Date End Date Taking? Authorizing Provider  aspirin EC 81 MG EC tablet Take 1 tablet (81 mg  total) by mouth daily. Swallow whole. 06/19/20 06/14/21  Kayleen Memos, DO  atorvastatin (LIPITOR) 40 MG tablet TAKE 1 TABLET BY MOUTH EVERY DAY 11/16/20   Billie Ruddy, MD  blood glucose meter kit and supplies KIT Dispense based on patient and insurance preference. Use up to four times daily as directed. (FOR ICD-9 250.00, 250.01). 06/19/20   Kayleen Memos, DO  Blood Pressure KIT Use as directed for checking BP daily. 07/11/20   Billie Ruddy, MD  calcitRIOL (ROCALTROL) 0.25 MCG capsule TAKE 1 CAPSULE BY MOUTH EVERY DAY 12/13/20   Billie Ruddy, MD  carvedilol (COREG) 6.25 MG tablet Take one tablet twice daily. 12/28/20   Billie Ruddy, MD  [facility] Lancets Micro Thin 33G MISC Use as directed for testing blood sugar twice daily, more frequently for hyper or hypoglycemia. 12/06/20   Billie Ruddy, MD  furosemide (LASIX) 80 MG tablet Take one tablet daily. 12/28/20   Billie Ruddy, MD  glimepiride (AMARYL) 1 MG tablet TAKE 1 TABLET (1 MG TOTAL) BY MOUTH DAILY WITH BREAKFAST. 11/16/20 02/14/21  Billie Ruddy, MD    Physical Exam: Vitals:   01/03/21 1004 01/03/21 1130 01/03/21 1230  BP: (!) 175/91 (!) 170/93 (!) 176/98  Pulse: 92 86 89  Resp: 18 (!) 27 (!) 22  Temp: 98.6 F (37 C)    SpO2: 92% 98% 96%    Constitutional: NAD,  calm, comfortable Vitals:   01/03/21 1004 01/03/21 1130 01/03/21 1230  BP: (!) 175/91 (!) 170/93 (!) 176/98  Pulse: 92 86 89  Resp: 18 (!) 27 (!) 22  Temp: 98.6 F (37 C)    SpO2: 92% 98% 96%   Eyes: PERRL, lids and conjunctivae normal ENMT: Mucous membranes are moist. Posterior pharynx clear of any exudate or lesions.Normal dentition.  Neck: normal, supple, no masses, no thyromegaly. JVD about 7 cm above clavicle Respiratory: clear to auscultation bilaterally, no wheezing, fine crackles to B/L mid fields. Increasing respiratory effort. No accessory muscle use.  Cardiovascular: Regular rate and rhythm, no murmurs / rubs / gallops. 2+ extremity edema to upper  thigh. 2+ pedal pulses. No carotid bruits.  Abdomen: no tenderness, no masses palpated. No hepatosplenomegaly. Bowel sounds positive.  Musculoskeletal: no clubbing / cyanosis. No joint deformity upper and lower extremities. Good ROM, no contractures. Normal muscle tone.  Skin: no rashes, lesions, ulcers. No induration Neurologic: CN 2-12 grossly intact. Sensation intact, DTR normal. Strength 5/5 in all 4.  Psychiatric: Normal judgment and insight. Alert and oriented x 3. Normal mood.     Labs on Admission: I have personally reviewed following labs and imaging studies  CBC: Recent Labs  Lab 01/03/21 1016  WBC 6.9  HGB 11.7*  HCT 37.2  MCV 101.6*  PLT 277   Basic Metabolic Panel: Recent Labs  Lab 01/03/21 1016  NA 143  K 4.8  CL 107  CO2 30  GLUCOSE 78  BUN 48*  CREATININE 2.58*  CALCIUM 8.9   GFR: Estimated Creatinine Clearance: 20.3 mL/min (A) (by C-G formula based on SCr of 2.58 mg/dL (H)). Liver Function Tests: Recent Labs  Lab 01/03/21 1016  AST 16  ALT 17  ALKPHOS 65  BILITOT 0.8  PROT 6.4*  ALBUMIN 3.7   No results for input(s): LIPASE, AMYLASE in the last 168 hours. No results for input(s): AMMONIA in the last 168 hours. Coagulation Profile: No results for input(s): INR, PROTIME in the last 168 hours. Cardiac Enzymes: No results for input(s): CKTOTAL, CKMB, CKMBINDEX, TROPONINI in the last 168 hours. BNP (last 3 results) Recent Labs    12/06/20 0927  PROBNP >5000.0*   HbA1C: No results for input(s): HGBA1C in the last 72 hours. CBG: No results for input(s): GLUCAP in the last 168 hours. Lipid Profile: No results for input(s): CHOL, HDL, LDLCALC, TRIG, CHOLHDL, LDLDIRECT in the last 72 hours. Thyroid Function Tests: No results for input(s): TSH, T4TOTAL, FREET4, T3FREE, THYROIDAB in the last 72 hours. Anemia Panel: No results for input(s): VITAMINB12, FOLATE, FERRITIN, TIBC, IRON, RETICCTPCT in the last 72 hours. Urine analysis:    Component  Value Date/Time   COLORURINE YELLOW 06/12/2020 0916   APPEARANCEUR CLEAR 06/12/2020 0916   LABSPEC 1.009 06/12/2020 0916   PHURINE 6.0 06/12/2020 0916   GLUCOSEU NEGATIVE 06/12/2020 0916   HGBUR SMALL (A) 06/12/2020 0916   BILIRUBINUR NEGATIVE 06/12/2020 0916   KETONESUR NEGATIVE 06/12/2020 0916   PROTEINUR 100 (A) 06/12/2020 0916   UROBILINOGEN 0.2 10/20/2017 1115   NITRITE NEGATIVE 06/12/2020 0916   LEUKOCYTESUR SMALL (A) 06/12/2020 0916    Radiological Exams on Admission: DG Chest 2 View  Result Date: 01/03/2021 CLINICAL DATA:  Bilateral leg swelling and shortness of breath for a few days EXAM: CHEST - 2 VIEW COMPARISON:  06/09/2020 FINDINGS: Small pleural effusions which are unchanged. Pulmonary vascular congestion. Scarring versus fluid retention at the right minor fissure. Cardiomegaly.  CABG. IMPRESSION: Vascular congestion and small  pleural effusions similar to 2021. Electronically Signed   By: Monte Fantasia M.D.   On: 01/03/2021 11:08    EKG: Independently reviewed.  Similar ST-T depression on 2, 3, aVF, V4 through V6.  Assessment/Plan Active Problems:   Acute CHF (congestive heart failure) (HCC)   CHF (congestive heart failure) ([place])  (please populate well all problems here in Problem List. (For example, if patient is on BP meds at home and you resume or decide to hold them, it is a problem that needs to be her. Same for CAD, COPD, HLD and so on)  Acute on chronic systolic CHF decompensation -Secondary to not coherent to Lasix and BP regimen.  Now has significant signs of fluid overload, continue IV Lasix 40 mg twice daily for day or 2 then switch back to p.o. regimen.  Restart Coreg. Consider hydralazine plus Imdur regimen. -PRN hydralazine. -Echocardiogram  Uncontrolled hypertension -Secondary to noncoherent with BP regimen.  Restart BP medication and diuresis for now.  Positive troponins -No chest pain, EKG has similar ST changes as before, suspect elevation  troponin related to demand ischemia from CHF decompensation as well as uncontrolled hypertension. -Trend troponins, echo ordered.  CKD stage III -Fluid overload, creatinine level stable compared to baseline. -Daily BMP while on IV diuresis  CAD -Elevation of troponin likely related to CHF and uncontrolled hypertension, no EKG changes, no chest pain.  Outpatient cardiology follow-up.  IIDM -Continue home PO DM Regimen  DVT prophylaxis: Heparin subcu  code Status: Full Code Family Communication: None at bedside Disposition Plan: Expect 2-3 days hospital stay for IV diuresis while monitoring kidney function Consults called: None Admission status: Tele admit   Lequita Halt MD [facility] Pager [phone]  01/03/2021, 12:57 PM

## 2021-01-03 NOTE — ED Triage Notes (Signed)
Pt here for bil swollen legs ,sob while walking and having to sleep sitting up some ,pt had been on lasix in the past but does not take it now

## 2021-01-04 DIAGNOSIS — I2510 Atherosclerotic heart disease of native coronary artery without angina pectoris: Secondary | ICD-10-CM

## 2021-01-04 DIAGNOSIS — N184 Chronic kidney disease, stage 4 (severe): Secondary | ICD-10-CM

## 2021-01-04 DIAGNOSIS — I10 Essential (primary) hypertension: Secondary | ICD-10-CM

## 2021-01-04 DIAGNOSIS — E1169 Type 2 diabetes mellitus with other specified complication: Secondary | ICD-10-CM

## 2021-01-04 DIAGNOSIS — I5023 Acute on chronic systolic (congestive) heart failure: Secondary | ICD-10-CM

## 2021-01-04 DIAGNOSIS — E785 Hyperlipidemia, unspecified: Secondary | ICD-10-CM

## 2021-01-04 DIAGNOSIS — E1122 Type 2 diabetes mellitus with diabetic chronic kidney disease: Secondary | ICD-10-CM

## 2021-01-04 LAB — GLUCOSE, CAPILLARY
Glucose-Capillary: 68 mg/dL — ABNORMAL LOW (ref 70–99)
Glucose-Capillary: 101 mg/dL — ABNORMAL HIGH (ref 70–99)
Glucose-Capillary: 84 mg/dL (ref 70–99)
Glucose-Capillary: 119 mg/dL — ABNORMAL HIGH (ref 70–99)
Glucose-Capillary: 181 mg/dL — ABNORMAL HIGH (ref 70–99)

## 2021-01-04 LAB — BASIC METABOLIC PANEL
Anion gap: 5 (ref 5–15)
BUN: 52 mg/dL — ABNORMAL HIGH (ref 8–23)
CO2: 30 mmol/L (ref 22–32)
Calcium: 8.4 mg/dL — ABNORMAL LOW (ref 8.9–10.3)
Chloride: 106 mmol/L (ref 98–111)
Creatinine, Ser: 2.77 mg/dL — ABNORMAL HIGH (ref 0.44–1.00)
GFR, Estimated: 18 mL/min — ABNORMAL LOW (ref >60)
Glucose, Bld: 69 mg/dL — ABNORMAL LOW (ref 70–99)
Potassium: 4.7 mmol/L (ref 3.5–5.1)
Sodium: 141 mmol/L (ref 135–145)

## 2021-01-04 MED ORDER — FUROSEMIDE 10 MG/ML IJ SOLN
60.0000 mg | Freq: Two times a day (BID) | INTRAMUSCULAR | Status: DC
  Administered 2021-01-04 – 2021-01-07 (×6): 60 mg via INTRAVENOUS
  Filled 2021-01-04 (×5): qty 6

## 2021-01-04 MED ORDER — FUROSEMIDE 10 MG/ML IJ SOLN
60.0000 mg | Freq: Two times a day (BID) | INTRAMUSCULAR | Status: DC
  Filled 2021-01-04: qty 6

## 2021-01-04 MED ORDER — FUROSEMIDE 10 MG/ML IJ SOLN: 20.0000 mg | Freq: Once | INTRAMUSCULAR | Status: DC

## 2021-01-04 NOTE — Progress Notes (Signed)
Inpatient Diabetes Program Recommendations  AACE/ADA: New Consensus Statement on Inpatient Glycemic Control (2015)  Target Ranges:  Prepandial:   less than 140 mg/dL      Peak postprandial:   less than 180 mg/dL (1-2 hours)      Critically ill patients:  140 - 180 mg/dL   Lab Results  Component Value Date   GLUCAP 101 (H) 01/04/2021   HGBA1C 6.4 12/06/2020    Review of Glycemic Control Results for PEGI, HEIBERGER (MRN [id number]) as of 01/04/2021 09:24  Ref. Range 01/03/2021 17:55 01/03/2021 18:11 01/03/2021 18:42 01/03/2021 21:10 01/04/2021 05:58 01/04/2021 07:25  Glucose-Capillary Latest Ref Range: 70 - 99 mg/dL 45 (L) 66 (L) 119 (H) 162 (H) 68 (L) 101 (H)   Diabetes history: DM 2 Outpatient Diabetes medications: Amaryl 1 mg Daily Current orders for Inpatient glycemic control:  Amaryl1 mg Daily Novolog 0-9 units tid  Inpatient Diabetes Program Recommendations:    Repeated hypoglycemia. Pt on Sulfonyurea (carries large risk of hypoglycemia).  -  D/c Amaryl while inpatient  Thanks,  Tama Headings RN, MSN, BC-ADM Inpatient Diabetes Coordinator Team Pager [phone] (8a-5p)

## 2021-01-04 NOTE — Progress Notes (Signed)
Heart Failure Nurse Navigator Progress Note  PCP: Billie Ruddy, MD PCP-Cardiologist: Joaquim Nam., MD (new) Admission Diagnosis: CHF Admitted from: home with spouse  Presentation:   Beverly Martin presented with increasing SOB, BLE edema. Ran out of lasix and coreg x2 weeks and was unable to get it filled by PCP or Cards office per pt statement. Pt sitting in recliner on room air with lunch tray present, agreeable to quick interview with HF navigator and pharmacist. Pt stated she has not followed up with cardiology well because she was worried they were "out of network" for her insurance. Navigator gave patient information how to call insurance company to see which physician groups are in her network-Dr. Acie Fredrickson and Heart Failure clinic (HV TOC) written on piece of paper for her to reference. Pt states she still works and plans to retire when she turns 15 this summer.   ECHO/ LVEF: 20-25%.  8/21 45-50%  Clinical Course:  Past Medical History:  Diagnosis Date  . CAD (coronary artery disease)   . Diabetes mellitus without complication ([place])   . Essential hypertension   . Hyperlipidemia      Social History   Socioeconomic History  . Marital status: Married    Spouse name: Not on file  . Number of children: Not on file  . Years of education: Not on file  . Highest education level: Not on file  Occupational History  . Not on file  Tobacco Use  . Smoking status: Never Smoker  . Smokeless tobacco: Never Used  Vaping Use  . Vaping Use: Never used  Substance and Sexual Activity  . Alcohol use: No  . Drug use: No  . Sexual activity: Not Currently  Other Topics Concern  . Not on file  Social History Narrative  . Not on file   Social Determinants of Health   Financial Resource Strain: Low Risk   . Difficulty of Paying Living Expenses: Not hard at all  Food Insecurity: No Food Insecurity  . Worried About Charity fundraiser in the Last Year: Never true  . Ran Out of Food in  the Last Year: Never true  Transportation Needs: No Transportation Needs  . Lack of Transportation (Medical): No  . Lack of Transportation (Non-Medical): No  Physical Activity: Insufficiently Active  . Days of Exercise per Week: 2 days  . Minutes of Exercise per Session: 20 min  Stress: Not on file  Social Connections: Not on file    High Risk Criteria for Readmission and/or Poor Patient Outcomes:  Heart failure hospital admissions (last 6 months): 1  No Show rate: 29%  Difficult social situation: no  Demonstrates medication adherence: yes  Primary Language: English  Literacy level: Able to read/write and comprehend  Education Assessment and Provision:  Detailed education and instructions provided on heart failure disease management including the following:  Signs and symptoms of Heart Failure When to call the physician Importance of daily weights Low sodium diet Fluid restriction Medication management Anticipated future follow-up appointments  Patient education given on each of the above topics.  Patient acknowledges understanding via teach back method and acceptance of all instructions.  Education Materials:  "Living Better With Heart Failure" Booklet, HF zone tool, & Daily Weight Tracker Tool.  Patient has scale at home: yes Patient has pill box at home: yes  Barriers of Care:   -education -medication compliance  Considerations/Referrals:   Referral made to Heart Failure Pharmacist Stewardship: yes, at bedside Referral made to Heart &  Vascular TOC clinic: yes, will schedule closer to DC if pt ok with finances related to insurance  Items for Follow-up on DC/TOC: - optimize - medication compliance  Pricilla Holm, RN, BSN Heart Failure Nurse Navigator [phone]

## 2021-01-04 NOTE — Progress Notes (Addendum)
PROGRESS NOTE    Beverly Martin  [id number] DOB: December 01, 1955 DOA: 01/03/2021 PCP: Billie Ruddy, MD    Brief Narrative:  Beverly Martin day was admitted to the hospital with a working diagnosis of acute systolic heart failure exacerbation.  65 year old female past medical history for systolic heart failure, coronary artery disease status post bypass grafting, hypertension, type 2 diabetes mellitus, and dyslipidemia who presented with dyspnea and edema for about 7 days.  Patient ran out of her medications about 2 weeks prior to hospitalization.  Her symptoms have been associated with orthopnea and exertional dyspnea.  On her initial physical examination blood pressure 175/91, heart rate 92, respiratory rate 18, temperature 98.6, oxygen saturation 92% positive JVD, lungs with rales bilaterally, increased work of breathing, heart S1-S2, present, rhythmic, abdomen soft, positive pitting bilateral extremity edema up to thighs.  Sodium 143, potassium 4.8, chloride 107, bicarb 30, glucose 78, BUN 48, creatinine 2.58, BNP > 4500, white count 6.9, hemoglobin 11.7, hematocrit 37.2, platelets 178. SARS COVID-19 negative. Urinalysis >300 protein, 1.0 118 specific gravity, 21-50 white cells.  Chest radiograph with cardiomegaly, bilateral hilar vascular congestion, bilateral pleural effusions, upper zone right lower lobe plate atelectasis. EKG 92 bpm, rightward axis, normal intervals, sinus rhythm, upsloping ST depression with T wave inversions in lead II, lead III, aVF, V5-V6.  Assessment & Plan:   Principal Problem:   Acute systolic heart failure (HCC) Active Problems:   Essential hypertension   Type 2 diabetes mellitus with hyperlipidemia (HCC)   CAD (coronary artery disease)   Dyslipidemia   CHF (congestive heart failure) ([place])   CKD stage 4 due to type 2 diabetes mellitus ([place])   1. Acute on chronic systolic heart failure exacerbation. Patient continue to have hypervolemia, with lower  extremity edema, rales on auscultation and positive JVD.  Urine output 800 cc since admission, systolic blood pressure [id number] mmHg, warm extremities. Echocardiogram with worsening LV systolic dysfunction with EF down to 20 to 25%, LV with global hypokinesis, RV with preserved systolic function, estimated RSVP 42.5 mmHg. Elevated LA pressures.  Mild to moderate mitral regurgitation and moderate TR.   Continue diuresis with furosemide, increase dose to 60 mg IV q12 hrs, to target further negative fluid balance.    Plan further heart failure guideline therapy once patient more euvolemic.   2. HTN/ CAD. Continue blood pressure control with carvedilol, will continue with aggressive diuresis.   3. CKD stage 4. Base serum cr around 3,0, today renal function with serum cr at 2,77 with K at 4,7 and serum bicarbonate at 30. Continue diuresis with furosemide and follow on renal function in am.   4. T2DM Continue glucose control with sliding scale, hold on oral hypoglycemic agents. Patient will benefit from SGLT2 inh.   Patient continue to be at high risk for  Worsening heart failure   Status is: Inpatient  Remains inpatient appropriate because:IV treatments appropriate due to intensity of illness or inability to take PO   Dispo: The patient is from: Home              Anticipated d/c is to: Home              Patient currently is not medically stable to d/c.   Difficult to place patient No  DVT prophylaxis: Heparin    Code Status:   full  Family Communication:  No family at the bedside       Subjective: Patient continue to have dyspnea and lower extremity edema, mild  improvement but not yet back to baseline,   Objective: Vitals:   01/04/21 0127 01/04/21 0429 01/04/21 0730 01/04/21 1135  BP: 134/85 (!) 149/82 (!) 148/84 130/70  Pulse: 75 74 73 75  Resp: '18 20 18 17  '$ Temp: 98.3 F (36.8 C) 98.2 F (36.8 C) 98.1 F (36.7 C) 98.6 F (37 C)  TempSrc: Oral Oral Oral Oral  SpO2: 92% 93%  93% 92%  Weight: 65 kg     Height:        Intake/Output Summary (Last 24 hours) at 01/04/2021 1357 Last data filed at 01/04/2021 0600 Gross per 24 hour  Intake 700 ml  Output 800 ml  Net -100 ml   Filed Weights   01/03/21 1557 01/04/21 0127  Weight: 64.9 kg 65 kg    Examination:   General: deconditioned  Neurology: Awake and alert, non focal  E ENT: no pallor, no icterus, oral mucosa moist Cardiovascular: Positive JVD. S1-S2 present, rhythmic, no gallops, rubs, or murmurs. +++ pitting bilateral lower extremity edema. Pulmonary: positive breath sounds bilaterally, with no wheezing, positive rales bilaterally  Gastrointestinal. Abdomen soft and non tender Skin. No rashes Musculoskeletal: no joint deformities     Data Reviewed: I have personally reviewed following labs and imaging studies  CBC: Recent Labs  Lab 01/03/21 1016  WBC 6.9  HGB 11.7*  HCT 37.2  MCV 101.6*  PLT 0000000   Basic Metabolic Panel: Recent Labs  Lab 01/03/21 1016 01/04/21 0400  NA 143 141  K 4.8 4.7  CL 107 106  CO2 30 30  GLUCOSE 78 69*  BUN 48* 52*  CREATININE 2.58* 2.77*  CALCIUM 8.9 8.4*   GFR: Estimated Creatinine Clearance: 17.5 mL/min (A) (by C-G formula based on SCr of 2.77 mg/dL (H)). Liver Function Tests: Recent Labs  Lab 01/03/21 1016  AST 16  ALT 17  ALKPHOS 65  BILITOT 0.8  PROT 6.4*  ALBUMIN 3.7   No results for input(s): LIPASE, AMYLASE in the last 168 hours. No results for input(s): AMMONIA in the last 168 hours. Coagulation Profile: No results for input(s): INR, PROTIME in the last 168 hours. Cardiac Enzymes: No results for input(s): CKTOTAL, CKMB, CKMBINDEX, TROPONINI in the last 168 hours. BNP (last 3 results) Recent Labs    12/06/20 0927  PROBNP >5000.0*   HbA1C: No results for input(s): HGBA1C in the last 72 hours. CBG: Recent Labs  Lab 01/03/21 1842 01/03/21 2110 01/04/21 0558 01/04/21 0725 01/04/21 1144  GLUCAP 119* 162* 68* 101* 84    Lipid Profile: No results for input(s): CHOL, HDL, LDLCALC, TRIG, CHOLHDL, LDLDIRECT in the last 72 hours. Thyroid Function Tests: No results for input(s): TSH, T4TOTAL, FREET4, T3FREE, THYROIDAB in the last 72 hours. Anemia Panel: No results for input(s): VITAMINB12, FOLATE, FERRITIN, TIBC, IRON, RETICCTPCT in the last 72 hours.    Radiology Studies: I have reviewed all of the imaging during this hospital visit personally     Scheduled Meds: . aspirin EC  81 mg Oral Daily  . atorvastatin  40 mg Oral Daily  . carvedilol  6.25 mg Oral BID WC  . furosemide  40 mg Intravenous BID  . glimepiride  1 mg Oral Q breakfast  . heparin  5,000 Units Subcutaneous Q8H  . insulin aspart  0-9 Units Subcutaneous TID WC  . sodium chloride flush  3 mL Intravenous Q12H   Continuous Infusions: . sodium chloride       LOS: 1 day  Marlissa Emerick Gerome Apley, MD

## 2021-01-04 NOTE — Progress Notes (Signed)
CBG 68. Pt. Given orange juice.

## 2021-01-04 NOTE — Progress Notes (Signed)
Heart Failure Stewardship Pharmacist Progress Note   PCP: Billie Ruddy, MD PCP-Cardiologist: Mertie Moores, MD    HPI:  65 yo F with PMH of CHF, CAD s/p CABG, HTN, CKD IV, T2DM, and HLD. She presented to the ED on 01/03/21 with increasing shortness of breath and LE edema. She reportedly stopped taking furosemide and carvedilol about 2 weeks ago due to not being able to get it refilled by PCP or cardiology office. An ECHO was done on 01/03/21 and LVEF was reduced to 20-25% (was 45-50% in Aug 2021).  Current HF Medications: Furosemide 60 mg IV BID Carvedilol 6.25 mg BID  Prior to admission HF Medications: None  Pertinent Lab Values: . Serum creatinine 2.77, BUN 52, Potassium 4.7, Sodium 141, BNP >4500  Vital Signs: . Weight: 143 lbs (admission weight: 143 lbs) . Blood pressure: 130-150/80s  . Heart rate: 70-80s  Medication Assistance / Insurance Benefits Check: Does the patient have prescription insurance?  Yes Type of insurance plan: Pharmacist, community  Outpatient Pharmacy:  [facility] Is the patient willing to use [facility] at discharge? Yes Is the patient willing to transition their outpatient pharmacy to utilize a [facility] outpatient pharmacy?   Pending    Assessment: 1. Acute on chronic systolic CHF (EF 0000000), due to ICM and medication noncompliance. NYHA class III symptoms. - Agree with increasing furosemide to 60 mg IV BID - Continue carvedilol 6.25 mg BID - AKI / CKD limiting use of Entresto, spironolactone, and SGLT2i - may be able to optimize further pending improvement in renal function   Plan: 1) Medication changes recommended at this time: - Continue IV diuresis - GDMT limited by CKD  2) Patient assistance: - Farxiga copay $15 per month - can enroll in monthly copay card to lower copay to $0 per month - Entresto copay $70 per month - can enroll in monthly copay card to lower copay to $10 per 1-3 month  supply   3)  Education  - To be completed prior to discharge  Kerby Nora, PharmD, BCPS Heart Failure Stewardship Pharmacist Phone [phone]

## 2021-01-04 NOTE — Plan of Care (Signed)
  Problem: Elimination: Goal: Will not experience complications related to urinary retention Outcome: Progressing   Problem: Safety: Goal: Ability to remain free from injury will improve Outcome: Progressing   Problem: Activity: Goal: Capacity to carry out activities will improve Outcome: Progressing   

## 2021-01-04 NOTE — Progress Notes (Signed)
Late entry:  Upon admission on 01/03/2021, patient had redness noted in the sacral area. Area was blanchable and skin was fully intact. Sacral heart foam was placed on the patient for protection.

## 2021-01-05 LAB — BASIC METABOLIC PANEL
Anion gap: 3 — ABNORMAL LOW (ref 5–15)
BUN: 55 mg/dL — ABNORMAL HIGH (ref 8–23)
CO2: 32 mmol/L (ref 22–32)
Calcium: 8.2 mg/dL — ABNORMAL LOW (ref 8.9–10.3)
Chloride: 106 mmol/L (ref 98–111)
Creatinine, Ser: 3.08 mg/dL — ABNORMAL HIGH (ref 0.44–1.00)
GFR, Estimated: 16 mL/min — ABNORMAL LOW (ref >60)
Glucose, Bld: 72 mg/dL (ref 70–99)
Potassium: 4.7 mmol/L (ref 3.5–5.1)
Sodium: 141 mmol/L (ref 135–145)

## 2021-01-05 LAB — GLUCOSE, CAPILLARY
Glucose-Capillary: 55 mg/dL — ABNORMAL LOW (ref 70–99)
Glucose-Capillary: 109 mg/dL — ABNORMAL HIGH (ref 70–99)
Glucose-Capillary: 143 mg/dL — ABNORMAL HIGH (ref 70–99)
Glucose-Capillary: 152 mg/dL — ABNORMAL HIGH (ref 70–99)
Glucose-Capillary: 58 mg/dL — ABNORMAL LOW (ref 70–99)
Glucose-Capillary: 115 mg/dL — ABNORMAL HIGH (ref 70–99)

## 2021-01-05 MED ORDER — ISOSORB DINITRATE-HYDRALAZINE 20-37.5 MG PO TABS
1.0000 | ORAL_TABLET | Freq: Three times a day (TID) | ORAL | Status: DC
  Administered 2021-01-05 – 2021-01-08 (×9): 1 via ORAL
  Filled 2021-01-05 (×9): qty 1

## 2021-01-05 MED ORDER — COVID-19 MRNA VAC-TRIS(PFIZER) 30 MCG/0.3ML IM SUSP
0.3000 mL | Freq: Once | INTRAMUSCULAR | Status: AC
  Administered 2021-01-06: 0.3 mL via INTRAMUSCULAR
  Filled 2021-01-05: qty 0.3

## 2021-01-05 NOTE — Progress Notes (Signed)
PROGRESS NOTE    CHAMPAIGN WANGELIN  [id number] DOB: Nov 06, 1955 DOA: 01/03/2021 PCP: Billie Ruddy, MD    Brief Narrative:  Mrs. Paap day was admitted to the hospital with a working diagnosis of acute systolic heart failure exacerbation.  65 year old female past medical history for systolic heart failure, coronary artery disease status post bypass grafting, hypertension, type 2 diabetes mellitus, and dyslipidemia who presented with dyspnea and edema for about 7 days.  Patient ran out of her medications about 2 weeks prior to hospitalization.  Her symptoms have been associated with orthopnea and exertional dyspnea.  On her initial physical examination blood pressure 175/91, heart rate 92, respiratory rate 18, temperature 98.6, oxygen saturation 92% positive JVD, lungs with rales bilaterally, increased work of breathing, heart S1-S2, present, rhythmic, abdomen soft, positive pitting bilateral extremity edema up to thighs.  Sodium 143, potassium 4.8, chloride 107, bicarb 30, glucose 78, BUN 48, creatinine 2.58, BNP > 4500, white count 6.9, hemoglobin 11.7, hematocrit 37.2, platelets 178. SARS COVID-19 negative. Urinalysis >300 protein, 1.0 118 specific gravity, 21-50 white cells.  Chest radiograph with cardiomegaly, bilateral hilar vascular congestion, bilateral pleural effusions, upper zone right lower lobe plate atelectasis. EKG 92 bpm, rightward axis, normal intervals, sinus rhythm, upsloping ST depression with T wave inversions in lead II, lead III, aVF, V5-V6.  Patient placed on IV furosemide for aggressive diuresis.  Follow up echocardiogram with worsening LV systolic function.   Assessment & Plan:   Principal Problem:   Acute systolic heart failure (HCC) Active Problems:   Essential hypertension   Type 2 diabetes mellitus with hyperlipidemia (HCC)   CAD (coronary artery disease)   Dyslipidemia   CHF (congestive heart failure) ([place])   CKD stage 4 due to type 2 diabetes  mellitus ([place])   1. Acute on chronic systolic heart failure exacerbation.  Echocardiogram with worsening LV systolic dysfunction with EF down to 20 to 25%, LV with global hypokinesis, RV with preserved systolic function, estimated RSVP 42.5 mmHg. Elevated LA pressures.  Mild to moderate mitral regurgitation and moderate TR.   Hypervolemia has improved but not yet back to baseline, urine output over last 24 hrs is 1,900 cc. She continue to have JVD, rales and lower extremity edema. Systolic blood pressure is 122 mmHg. Warm extremities.   On furosemide  60 mg IV q12 hrs. Continue carvedilol 6,25 mg po bid. Add bidil tid, holding on spironolactone due to low GFR and risk of hyperkalemia.    2. HTN/ CAD. Continue blood pressure control with carvedilol, will continue with aggressive diuresis.   3. AKI on CKD stage 4/ cardio-renal syndrome Renal function with serum cr at 3,0 with K at 4,7 and serum bicarbonate at 32. Continue close follow up on renal function and electrolytes, continue diuresis for hypervolemia.   4. T2DM/ dyslipidemia  Fasting glucose this am is 72, will continue close monitoring with insulin sliding scale,  Hold on oral hypoglycemic agents. Will add empagliflozin before discharge.   Continue with atorvastatin.    Patient continue to be at high risk for worsening heart failure   Status is: Inpatient  Remains inpatient appropriate because:IV treatments appropriate due to intensity of illness or inability to take PO   Dispo: The patient is from: Home              Anticipated d/c is to: Home              Patient currently is not medically stable to d/c.   Difficult  to place patient No   DVT prophylaxis: Enoxaparin   Code Status:   full  Family Communication:  I spoke over the phone with the patient's husband about patient's  condition, plan of care, prognosis and all questions were addressed.      Subjective: Patient feeling better, but continue to have  dyspnea and edema, no nausea or vomiting, no chest pain.  Objective: Vitals:   01/04/21 1959 01/05/21 0059 01/05/21 0453 01/05/21 0940  BP: 134/74  140/69 (!) 144/81  Pulse: 76  75   Resp: 19  18   Temp: 98.4 F (36.9 C)  97.9 F (36.6 C)   TempSrc: Oral  Oral   SpO2: 98%  97%   Weight:  65.3 kg    Height:        Intake/Output Summary (Last 24 hours) at 01/05/2021 1128 Last data filed at 01/05/2021 0941 Gross per 24 hour  Intake 763 ml  Output 1650 ml  Net -887 ml   Filed Weights   01/03/21 1557 01/04/21 0127 01/05/21 0059  Weight: 64.9 kg 65 kg 65.3 kg    Examination:   General: Not in pain or dyspnea, deconditioned  Neurology: Awake and alert, non focal  E ENT: no pallor, no icterus, oral mucosa moist Cardiovascular: moderate JVD. S1-S2 present, rhythmic, no gallops, rubs, or murmurs. +++ pitting bilateral lower extremity edema. Pulmonary: positive breath sounds bilaterally,with no wheezing, or rhonchi, bilateral inspiratory rales. Gastrointestinal. Abdomen soft and non tender Skin. No rashes Musculoskeletal: no joint deformities     Data Reviewed: I have personally reviewed following labs and imaging studies  CBC: Recent Labs  Lab 01/03/21 1016  WBC 6.9  HGB 11.7*  HCT 37.2  MCV 101.6*  PLT 0000000   Basic Metabolic Panel: Recent Labs  Lab 01/03/21 1016 01/04/21 0400 01/05/21 0436  NA 143 141 141  K 4.8 4.7 4.7  CL 107 106 106  CO2 30 30 32  GLUCOSE 78 69* 72  BUN 48* 52* 55*  CREATININE 2.58* 2.77* 3.08*  CALCIUM 8.9 8.4* 8.2*   GFR: Estimated Creatinine Clearance: 15.7 mL/min (A) (by C-G formula based on SCr of 3.08 mg/dL (H)). Liver Function Tests: Recent Labs  Lab 01/03/21 1016  AST 16  ALT 17  ALKPHOS 65  BILITOT 0.8  PROT 6.4*  ALBUMIN 3.7   No results for input(s): LIPASE, AMYLASE in the last 168 hours. No results for input(s): AMMONIA in the last 168 hours. Coagulation Profile: No results for input(s): INR, PROTIME in the last  168 hours. Cardiac Enzymes: No results for input(s): CKTOTAL, CKMB, CKMBINDEX, TROPONINI in the last 168 hours. BNP (last 3 results) Recent Labs    12/06/20 0927  PROBNP >5000.0*   HbA1C: No results for input(s): HGBA1C in the last 72 hours. CBG: Recent Labs  Lab 01/04/21 1144 01/04/21 1637 01/04/21 2110 01/05/21 0607 01/05/21 0823  GLUCAP 84 119* 181* 55* 109*   Lipid Profile: No results for input(s): CHOL, HDL, LDLCALC, TRIG, CHOLHDL, LDLDIRECT in the last 72 hours. Thyroid Function Tests: No results for input(s): TSH, T4TOTAL, FREET4, T3FREE, THYROIDAB in the last 72 hours. Anemia Panel: No results for input(s): VITAMINB12, FOLATE, FERRITIN, TIBC, IRON, RETICCTPCT in the last 72 hours.    Radiology Studies: I have reviewed all of the imaging during this hospital visit personally     Scheduled Meds: . aspirin EC  81 mg Oral Daily  . atorvastatin  40 mg Oral Daily  . carvedilol  6.25 mg Oral BID  WC  . furosemide  60 mg Intravenous BID  . heparin  5,000 Units Subcutaneous Q8H  . insulin aspart  0-9 Units Subcutaneous TID WC  . sodium chloride flush  3 mL Intravenous Q12H   Continuous Infusions: . sodium chloride       LOS: 2 days        Merlean Pizzini Gerome Apley, MD

## 2021-01-05 NOTE — TOC Progression Note (Signed)
Transition of Care [facility]) - Progression Note    Patient Details  Name: Beverly Martin MRN: [id number] Date of Birth: 24-Jan-1956  [facility] of [facility] CM/SW Contact  Zenon Mayo, RN Phone Number: 01/05/2021, 4:48 PM  Clinical Narrative:    NCM spoke with patient she lives with her spouse, she has a scale and weighs her self. She states she does not have a bp cuff. NCM informed her she may want to get this from a local pharmacy.  She uses [facility] at [address].  TOC will continue to follow for dc needs.        Expected Discharge Plan and Services                                                 Social Determinants of Health (SDOH) Interventions Food Insecurity Interventions: Intervention Not Indicated Financial Strain Interventions: Intervention Not Indicated Housing Interventions: Intervention Not Indicated Physical Activity Interventions: Intervention Not Indicated Transportation Interventions: Intervention Not Indicated Alcohol Brief Interventions/Follow-up: AUDIT Score <7 follow-up not indicated  Readmission Risk Interventions No flowsheet data found.

## 2021-01-06 LAB — BASIC METABOLIC PANEL
Anion gap: 7 (ref 5–15)
BUN: 57 mg/dL — ABNORMAL HIGH (ref 8–23)
CO2: 30 mmol/L (ref 22–32)
Calcium: 8.2 mg/dL — ABNORMAL LOW (ref 8.9–10.3)
Chloride: 104 mmol/L (ref 98–111)
Creatinine, Ser: 2.95 mg/dL — ABNORMAL HIGH (ref 0.44–1.00)
GFR, Estimated: 17 mL/min — ABNORMAL LOW (ref >60)
Glucose, Bld: 102 mg/dL — ABNORMAL HIGH (ref 70–99)
Potassium: 4.6 mmol/L (ref 3.5–5.1)
Sodium: 141 mmol/L (ref 135–145)

## 2021-01-06 LAB — GLUCOSE, CAPILLARY
Glucose-Capillary: 85 mg/dL (ref 70–99)
Glucose-Capillary: 171 mg/dL — ABNORMAL HIGH (ref 70–99)
Glucose-Capillary: 119 mg/dL — ABNORMAL HIGH (ref 70–99)
Glucose-Capillary: 174 mg/dL — ABNORMAL HIGH (ref 70–99)

## 2021-01-06 NOTE — Progress Notes (Signed)
PROGRESS NOTE    MARLYSS CALDERONE  [id number] DOB: 1956-01-23 DOA: 01/03/2021 PCP: Billie Ruddy, MD    Brief Narrative:  [name] was admitted to the hospital with a working diagnosis of acute systolic heart failure exacerbation.  65 year old female past medical history for systolic heart failure, coronary artery disease status post bypass grafting, hypertension, type 2 diabetes mellitus, and dyslipidemia who presented with dyspnea and edema for about 7 days. Patient ran out of her medications about 2 weeks prior to hospitalization. Her symptoms have been associated with orthopnea and exertional dyspnea.  On her initial physical examination blood pressure 175/91, heart rate 92, respiratory rate 18, temperature 98.6, oxygen saturation 92% positive JVD, lungs with rales bilaterally, increased work of breathing, heart S1-S2, present, rhythmic, abdomen soft, positive pitting bilateral extremity edema up to thighs.  Sodium 143, potassium 4.8, chloride 107, bicarb 30, glucose 78, BUN 48, creatinine 2.58, BNP>4500, white count 6.9, hemoglobin 11.7, hematocrit 37.2, platelets 178. SARS COVID-19 negative. Urinalysis>300protein, 1.0 118 specific gravity, 21-50 white cells.  Chest radiograph with cardiomegaly, bilateral hilar vascular congestion, bilateral pleural effusions, upper zone right lower lobe plate atelectasis. EKG 92 bpm, rightward axis, normal intervals, sinus rhythm, upsloping ST depression with T wave inversions in lead II, lead III, aVF, V5-V6.  Patient placed on IV furosemide for aggressive diuresis.  Follow up echocardiogram with worsening LV systolic function   Assessment & Plan:   Principal Problem:   Acute systolic heart failure (HCC) Active Problems:   Essential hypertension   Type 2 diabetes mellitus with hyperlipidemia (HCC)   CAD (coronary artery disease)   Dyslipidemia   CHF (congestive heart failure) ([place])   CKD stage 4 due to type 2 diabetes  mellitus ([place])    1. Acute on chronic systolic heart failure exacerbation.  Echocardiogram with worsening LV systolic dysfunction with EF down to 20 to 25%, LV with global hypokinesis, RV with preserved systolic function, estimated RSVP 42.5 mmHg. Elevated LA pressures.  Mild to moderate mitral regurgitation and moderate TR.   Improving hypervolemia, urine output over last 24 hrs is 2,200 ml.  Decrease edema and JVD.   Continue with furosemide  60 mg IV q12 hrs and after load reduction with bidil tid. B blocker with carvedilol.  Holding on Ace inh, ARB or spironolactone due to not stable GFR.   Increase mobility with PT and OT.   2. HTN/ CAD. Blood pressure systolic this am 123456 to [id number] mmHg. Continue with carvedilol and hydralazine/ isosorbide combination.   3. AKI on CKD stage4/ cardio-renal syndrome serum cr is 2,95 with K at 4,6 and bicarbonate at 30 with Na 141 and Cl 104.  Continue diuresis with furosemide, plan to transition in the next 24 to 48 hrs.   4. T2DM/ dyslipidemia  this am fasting glucose this am 103, continue glucose cover and monitoring with insulin sliding scale.  GFR below 30, hold on empagliflozin for now.   On atorvastatin.     Patient continue to be at high risk for worsening heart failure   Status is: Inpatient  Remains inpatient appropriate because:IV treatments appropriate due to intensity of illness or inability to take PO   Dispo: The patient is from: Home              Anticipated d/c is to: Home              Patient currently is not medically stable to d/c.   Difficult to place patient No  DVT prophylaxis: Heparin  Code Status:   full  Family Communication:   no family at the bedside       Subjective: Patient is feeling better, dyspnea and lower extremity edema, continue to improve, no nausea or vomiting.   Objective: Vitals:   01/05/21 1938 01/06/21 0042 01/06/21 0325 01/06/21 0926  BP: 124/65  129/70 140/72  Pulse:  82  79   Resp: 18  19   Temp: 97.8 F (36.6 C)  97.9 F (36.6 C)   TempSrc: Oral  Oral   SpO2: 100%  96% 96%  Weight:  65.2 kg    Height:        Intake/Output Summary (Last 24 hours) at 01/06/2021 0939 Last data filed at 01/06/2021 0926 Gross per 24 hour  Intake 956 ml  Output 2500 ml  Net -1544 ml   Filed Weights   01/04/21 0127 01/05/21 0059 01/06/21 0042  Weight: 65 kg 65.3 kg 65.2 kg    Examination:   General: Not in pain or dyspnea. Deconditioned  Neurology: Awake and alert, non focal  E ENT: mild pallor, no icterus, oral mucosa moist Cardiovascular: No JVD. S1-S2 present, rhythmic, no gallops, rubs, or murmurs. ++ pitting bilateral lower extremity edema. Pulmonary: positive breath sounds bilaterally, with no wheezing,or rhonchi, scattered rales at bases. Gastrointestinal. Abdomen soft and non tender Skin. No rashes Musculoskeletal: no joint deformities     Data Reviewed: I have personally reviewed following labs and imaging studies  CBC: Recent Labs  Lab 01/03/21 1016  WBC 6.9  HGB 11.7*  HCT 37.2  MCV 101.6*  PLT 0000000   Basic Metabolic Panel: Recent Labs  Lab 01/03/21 1016 01/04/21 0400 01/05/21 0436 01/06/21 0405  NA 143 141 141 141  K 4.8 4.7 4.7 4.6  CL 107 106 106 104  CO2 30 30 32 30  GLUCOSE 78 69* 72 102*  BUN 48* 52* 55* 57*  CREATININE 2.58* 2.77* 3.08* 2.95*  CALCIUM 8.9 8.4* 8.2* 8.2*   GFR: Estimated Creatinine Clearance: 16.4 mL/min (A) (by C-G formula based on SCr of 2.95 mg/dL (H)). Liver Function Tests: Recent Labs  Lab 01/03/21 1016  AST 16  ALT 17  ALKPHOS 65  BILITOT 0.8  PROT 6.4*  ALBUMIN 3.7   No results for input(s): LIPASE, AMYLASE in the last 168 hours. No results for input(s): AMMONIA in the last 168 hours. Coagulation Profile: No results for input(s): INR, PROTIME in the last 168 hours. Cardiac Enzymes: No results for input(s): CKTOTAL, CKMB, CKMBINDEX, TROPONINI in the last 168 hours. BNP (last 3  results) Recent Labs    12/06/20 0927  PROBNP >5000.0*   HbA1C: No results for input(s): HGBA1C in the last 72 hours. CBG: Recent Labs  Lab 01/05/21 1150 01/05/21 1543 01/05/21 2107 01/05/21 2211 01/06/21 0629  GLUCAP 143* 152* 58* 115* 85   Lipid Profile: No results for input(s): CHOL, HDL, LDLCALC, TRIG, CHOLHDL, LDLDIRECT in the last 72 hours. Thyroid Function Tests: No results for input(s): TSH, T4TOTAL, FREET4, T3FREE, THYROIDAB in the last 72 hours. Anemia Panel: No results for input(s): VITAMINB12, FOLATE, FERRITIN, TIBC, IRON, RETICCTPCT in the last 72 hours.    Radiology Studies: I have reviewed all of the imaging during this hospital visit personally     Scheduled Meds: . aspirin EC  81 mg Oral Daily  . atorvastatin  40 mg Oral Daily  . carvedilol  6.25 mg Oral BID WC  . COVID-19 mRNA Vac-TriS ([employer])  0.3 mL Intramuscular Once  .  furosemide  60 mg Intravenous BID  . heparin  5,000 Units Subcutaneous Q8H  . insulin aspart  0-9 Units Subcutaneous TID WC  . isosorbide-hydrALAZINE  1 tablet Oral TID  . sodium chloride flush  3 mL Intravenous Q12H   Continuous Infusions: . sodium chloride       LOS: 3 days        Mauricio Gerome Apley, MD

## 2021-01-06 NOTE — Evaluation (Signed)
Physical Therapy Evaluation Patient Details Name: Beverly Martin MRN: [id number] DOB: 1956-06-02 Today's Date: 01/06/2021   History of Present Illness  65 y.o. female presents to [facility] ED on 01/03/2021 with complaint of BLE swelling and orthopnea. PMH includes hypertension, diabetes type 2, CAD, CABG, chronic bilateral pedal edema.  Clinical Impression  Pt presents to PT near or at her functional baseline, independent in all mobility and denying SOB when mobilizing. PT encourages the patient to ambulate at least 3 times out of the room and around the unit each day to maintain her endurance and current level of function. Pt has no current acute PT needs. Acute PT signing off.    Follow Up Recommendations No PT follow up    Equipment Recommendations  None recommended by PT    Recommendations for Other Services       Precautions / Restrictions Precautions Precautions: None Restrictions Weight Bearing Restrictions: No      Mobility  Bed Mobility               General bed mobility comments: not assessed    Transfers Overall transfer level: Independent Equipment used: None                Ambulation/Gait Ambulation/Gait assistance: Independent Gait Distance (Feet): 350 Feet Assistive device: None Gait Pattern/deviations: WFL(Within Functional Limits) Gait velocity: functional Gait velocity interpretation: >2.62 ft/sec, indicative of community ambulatory    Stairs            Wheelchair Mobility    Modified Rankin (Stroke Patients Only)       Balance Overall balance assessment: Independent                                           Pertinent Vitals/Pain Pain Assessment: No/denies pain    Home Living Family/patient expects to be discharged to:: Private residence Living Arrangements: Spouse/significant other Available Help at Discharge: Family;Available PRN/intermittently Type of Home:  (townhome) Home Access: Level entry      Home Layout: One level Home Equipment: None      Prior Function Level of Independence: Independent         Comments: Works, standing all day.. Drives. independent ADLs/walking.     Hand Dominance   Dominant Hand: Right    Extremity/Trunk Assessment   Upper Extremity Assessment Upper Extremity Assessment: Overall WFL for tasks assessed    Lower Extremity Assessment Lower Extremity Assessment: Overall WFL for tasks assessed (BLE edema noted)    Cervical / Trunk Assessment Cervical / Trunk Assessment: Normal  Communication   Communication: No difficulties  Cognition Arousal/Alertness: Awake/alert Behavior During Therapy: WFL for tasks assessed/performed Overall Cognitive Status: Within Functional Limits for tasks assessed                                        General Comments General comments (skin integrity, edema, etc.): VSS on RA, pt denies symptoms    Exercises     Assessment/Plan    PT Assessment Patent does not need any further PT services  PT Problem List         PT Treatment Interventions      PT Goals (Current goals can be found in the Care Plan section)       Frequency  Barriers to discharge        Co-evaluation               AM-PAC PT "6 Clicks" Mobility  Outcome Measure Help needed turning from your back to your side while in a flat bed without using bedrails?: None Help needed moving from lying on your back to sitting on the side of a flat bed without using bedrails?: None Help needed moving to and from a bed to a chair (including a wheelchair)?: None Help needed standing up from a chair using your arms (e.g., wheelchair or bedside chair)?: None Help needed to walk in hospital room?: None Help needed climbing 3-5 steps with a railing? : None 6 Click Score: 24    End of Session   Activity Tolerance: Patient tolerated treatment well Patient left: in chair;with call bell/phone within reach Nurse  Communication: Mobility status      Time: [id number] PT Time Calculation (min) (ACUTE ONLY): 8 min   Charges:   PT Evaluation $PT Eval Low Complexity: Traer, PT, DPT Acute Rehabilitation Pager: [phone]   Zenaida Niece 01/06/2021, 3:17 PM

## 2021-01-07 LAB — GLUCOSE, CAPILLARY
Glucose-Capillary: 120 mg/dL — ABNORMAL HIGH (ref 70–99)
Glucose-Capillary: 123 mg/dL — ABNORMAL HIGH (ref 70–99)
Glucose-Capillary: 105 mg/dL — ABNORMAL HIGH (ref 70–99)
Glucose-Capillary: 214 mg/dL — ABNORMAL HIGH (ref 70–99)

## 2021-01-07 LAB — BASIC METABOLIC PANEL
Anion gap: 4 — ABNORMAL LOW (ref 5–15)
BUN: 60 mg/dL — ABNORMAL HIGH (ref 8–23)
CO2: 34 mmol/L — ABNORMAL HIGH (ref 22–32)
Calcium: 8.4 mg/dL — ABNORMAL LOW (ref 8.9–10.3)
Chloride: 103 mmol/L (ref 98–111)
Creatinine, Ser: 3.53 mg/dL — ABNORMAL HIGH (ref 0.44–1.00)
GFR, Estimated: 14 mL/min — ABNORMAL LOW (ref >60)
Glucose, Bld: 121 mg/dL — ABNORMAL HIGH (ref 70–99)
Potassium: 4.6 mmol/L (ref 3.5–5.1)
Sodium: 141 mmol/L (ref 135–145)

## 2021-01-07 MED ORDER — POLYETHYLENE GLYCOL 3350 17 G PO PACK
17.0000 g | PACK | Freq: Every day | ORAL | Status: DC | PRN
  Administered 2021-01-07: 17 g via ORAL
  Filled 2021-01-07: qty 1

## 2021-01-07 MED ORDER — SENNA 8.6 MG PO TABS: 1.0000 | ORAL_TABLET | Freq: Every evening | ORAL | Status: DC | PRN

## 2021-01-07 NOTE — Discharge Instructions (Signed)
Low Sodium Nutrition Therapy  Eating less sodium can help you if you have high blood pressure, heart failure, or kidney or liver disease.   Your body needs a little sodium, but too much sodium can cause your body to hold onto extra water. This extra water will raise your blood pressure and can cause damage to your heart, kidneys, or liver as they are forced to work harder.   Sometimes you can see how the extra fluid affects you because your hands, legs, or belly swell. You may also hold water around your heart and lungs, which makes it hard to breathe.   Even if you take medication for blood pressure or a water pill (diuretic) to remove fluid, it is still important to have less salt in your diet.   Check with your primary care provider before drinking alcohol since it may affect the amount of fluid in your body and how your heart, kidneys, or liver work. Sodium in Food A low-sodium meal plan limits the sodium that you get from food and beverages to 1,500-2,000 milligrams (mg) per day. Salt is the main source of sodium. Read the nutrition label on the package to find out how much sodium is in one serving of a food.  . Select foods with 140 milligrams (mg) of sodium or less per serving.  . You may be able to eat one or two servings of foods with a little more than 140 milligrams (mg) of sodium if you are closely watching how much sodium you eat in a day.  . Check the serving size on the label. The amount of sodium listed on the label shows the amount in one serving of the food. So, if you eat more than one serving, you will get more sodium than the amount listed.  Tips Cutting Back on Sodium . Eat more fresh foods.  . Fresh fruits and vegetables are low in sodium, as well as frozen vegetables and fruits that have no added juices or sauces.  . Fresh meats are lower in sodium than processed meats, such as bacon, sausage, and hotdogs.  . Not all processed foods are unhealthy, but some processed foods  may have too much sodium.  . Eat less salt at the table and when cooking. One of the ingredients in salt is sodium.  . One teaspoon of table salt has 2,300 milligrams of sodium.  . Leave the salt out of recipes for pasta, casseroles, and soups. . Be a smart shopper.  . Food packages that say "Salt-free", sodium-free", "very low sodium," and "low sodium" have less than 140 milligrams of sodium per serving.  . Beware of products identified as "Unsalted," "No Salt Added," "Reduced Sodium," or "Lower Sodium." These items may still be high in sodium. You should always check the nutrition label. . Add flavors to your food without adding sodium.  . Try lemon juice, lime juice, or vinegar.  . Dry or fresh herbs add flavor.  . Buy a sodium-free seasoning blend or make your own at home. . You can purchase salt-free or sodium-free condiments like barbeque sauce in stores and online. Ask your registered dietitian nutritionist for recommendations and where to find them.  .  Eating in Restaurants . Choose foods carefully when you eat outside your home. Restaurant foods can be very high in sodium. Many restaurants provide nutrition facts on their menus or their websites. If you cannot find that information, ask your server. Let your server know that you want your food   to be cooked without salt and that you would like your salad dressing and sauces to be served on the side.  .   . Foods Recommended . Food Group . Foods Recommended  . Grains . Bread, bagels, rolls without salted tops Homemade bread made with reduced-sodium baking powder Cold cereals, especially shredded wheat and puffed rice Oats, grits, or cream of wheat Pastas, quinoa, and rice Popcorn, pretzels or crackers without salt Corn tortillas  . Protein Foods . Fresh meats and fish; turkey bacon (check the nutrition labels - make sure they are not packaged in a sodium solution) Canned or packed tuna (no more than 4 ounces at 1 serving) Beans  and peas Soybeans) and tofu Eggs Nuts or nut butters without salt  . Dairy . Milk or milk powder Plant milks, such as rice and soy Yogurt, including Greek yogurt Small amounts of natural cheese (blocks of cheese) or reduced-sodium cheese can be used in moderation. (Swiss, ricotta, and fresh mozzarella cheese are lower in sodium than the others) Cream Cheese Low sodium cottage cheese  . Vegetables . Fresh and frozen vegetables without added sauces or salt Homemade soups (without salt) Low-sodium, salt-free or sodium-free canned vegetables and soups  . Fruit . Fresh and canned fruits Dried fruits, such as raisins, cranberries, and prunes  . Oils . Tub or liquid margarine, regular or without salt Canola, corn, peanut, olive, safflower, or sunflower oils  . Condiments . Fresh or dried herbs such as basil, bay leaf, dill, mustard (dry), nutmeg, paprika, parsley, rosemary, sage, or thyme.  Low sodium ketchup Vinegar  Lemon or lime juice Pepper, red pepper flakes, and cayenne. Hot sauce contains sodium, but if you use just a drop or two, it will not add up to much.  Salt-free or sodium-free seasoning mixes and marinades Simple salad dressings: vinegar and oil  .  . Foods Not Recommended . Food Group . Foods Not Recommended  . Grains . Breads or crackers topped with salt Cereals (hot/cold) with more than 300 mg sodium per serving Biscuits, cornbread, and other "quick" breads prepared with baking soda Pre-packaged bread crumbs Seasoned and packaged rice and pasta mixes Self-rising flours  . Protein Foods . Cured meats: Bacon, ham, sausage, pepperoni and hot dogs Canned meats (chili, vienna sausage, or sardines) Smoked fish and meats Frozen meals that have more than 600 mg of sodium per serving Egg substitute (with added sodium)  . Dairy . Buttermilk Processed cheese spreads Cottage cheese (1 cup may have over 500 mg of sodium; look for low-sodium.) American or feta cheese Shredded  Cheese has more sodium than blocks of cheese String cheese  . Vegetables . Canned vegetables (unless they are salt-free, sodium-free or low sodium) Frozen vegetables with seasoning and sauces Sauerkraut and pickled vegetables Canned or dried soups (unless they are salt-free, sodium-free, or low sodium) French fries and onion rings  . Fruit . Dried fruits preserved with additives that have sodium  . Oils . Salted butter or margarine, all types of olives  . Condiments . Salt, sea salt, kosher salt, onion salt, and garlic salt Seasoning mixes with salt Bouillon cubes Ketchup Barbeque sauce and Worcestershire sauce unless low sodium Soy sauce Salsa, pickles, olives, relish Salad dressings: ranch, blue cheese, Italian, and French.  .  . Low Sodium Sample 1-Day Menu  . Breakfast . 1 cup cooked oatmeal  . 1 slice whole wheat bread toast  . 1 tablespoon peanut butter without salt  . 1 banana  .   1 cup 1% milk  . Lunch . Tacos made with: 2 corn tortillas  .  cup black beans, low sodium  .  cup roasted or grilled chicken (without skin)  .  avocado  . Squeeze of lime juice  . 1 cup salad greens  . 1 tablespoon low-sodium salad dressing  .  cup strawberries  . 1 orange  . Afternoon Snack . 1/3 cup grapes  . 6 ounces yogurt  . Evening Meal . 3 ounces herb-baked fish  . 1 baked potato  . 2 teaspoons olive oil  .  cup cooked carrots  . 2 thick slices tomatoes on:  . 2 lettuce leaves  . 1 teaspoon olive oil  . 1 teaspoon balsamic vinegar  . 1 cup 1% milk  . Evening Snack . 1 apple  .  cup almonds without salt  .  . Low-Sodium Vegetarian (Lacto-Ovo) Sample 1-Day Menu  . Breakfast . 1 cup cooked oatmeal  . 1 slice whole wheat toast  . 1 tablespoon peanut butter without salt  . 1 banana  . 1 cup 1% milk  . Lunch . Tacos made with: 2 corn tortillas  .  cup black beans, low sodium  .  cup roasted or grilled chicken (without skin)  .  avocado  . Squeeze of lime juice  . 1  cup salad greens  . 1 tablespoon low-sodium salad dressing  .  cup strawberries  . 1 orange  . Evening Meal . Stir fry made with:  cup tofu  . 1 cup brown rice  .  cup broccoli  .  cup green beans  .  cup peppers  .  tablespoon peanut oil  . 1 orange  . 1 cup 1% milk  . Evening Snack . 4 strips celery  . 2 tablespoons hummus  . 1 hard-boiled egg  .  . Low-Sodium Vegan Sample 1-Day Menu  . Breakfast . 1 cup cooked oatmeal  . 1 tablespoon peanut butter without salt  . 1 cup blueberries  . 1 cup soymilk fortified with calcium, vitamin B12, and vitamin D  . Lunch . 1 small whole wheat pita  .  cup cooked lentils  . 2 tablespoons hummus  . 4 carrot sticks  . 1 medium apple  . 1 cup soymilk fortified with calcium, vitamin B12, and vitamin D  . Evening Meal . Stir fry made with:  cup tofu  . 1 cup brown rice  .  cup broccoli  .  cup green beans  .  cup peppers  .  tablespoon peanut oil  . 1 cup cantaloupe  . Evening Snack . 1 cup soy yogurt  .  cup mixed nuts  . Copyright 2020  Academy of [employer] and Dietetics. All rights reserved .  . Sodium Free Flavoring Tips .  . When cooking, the following items may be used for flavoring instead of salt or seasonings that contain sodium. . Remember: A little bit of spice goes a long way! Be careful not to overseason. . Spice Blend Recipe (makes about ? cup) . 5 teaspoons onion powder  . 2 teaspoons garlic powder  . 2 teaspoons paprika  . 2 teaspoon dry mustard  . 1 teaspoon crushed thyme leaves  .  teaspoon white pepper  .  teaspoon celery seed Food Item Flavorings  Beef Basil, bay leaf, caraway, curry, dill, dry mustard, garlic, grape jelly, green pepper, mace, marjoram, mushrooms (fresh), nutmeg, onion or onion powder, parsley, pepper,   rosemary, sage  Chicken Basil, cloves, cranberries, mace, mushrooms (fresh), nutmeg, oregano, paprika, parsley, pineapple, saffron, sage, savory, tarragon, thyme, tomato,  turmeric  Egg Chervil, curry, dill, dry mustard, garlic or garlic powder, green pepper, jelly, mushrooms (fresh), nutmeg, onion powder, paprika, parsley, rosemary, tarragon, tomato  Fish Basil, bay leaf, chervil, curry, dill, dry mustard, green pepper, lemon juice, marjoram, mushrooms (fresh), paprika, pepper, tarragon, tomato, turmeric  Lamb Cloves, curry, dill, garlic or garlic powder, mace, mint, mint jelly, onion, oregano, parsley, pineapple, rosemary, tarragon, thyme  Pork Applesauce, basil, caraway, chives, cloves, garlic or garlic powder, onion or onion powder, rosemary, thyme  Veal Apricots, basil, bay leaf, currant jelly, curry, ginger, marjoram, mushrooms (fresh), oregano, paprika  Vegetables Basil, dill, garlic or garlic powder, ginger, lemon juice, mace, marjoram, nutmeg, onion or onion powder, tarragon, tomato, sugar or sugar substitute, salt-free salad dressing, vinegar  Desserts Allspice, anise, cinnamon, cloves, ginger, mace, nutmeg, vanilla extract, other extracts   Copyright 2020  Academy of [employer] and Dietetics. All rights reserved  Fluid Restricted Nutrition Therapy  You have been prescribed this diet because your condition affects how much fluid you can eat or drink. If your heart, liver, or kidneys aren't working properly, you may not be able to effectively eliminate fluids from the body and this may cause swelling (edema) in the legs, arms, and/or stomach. Drink no more than 1.2 L . You don't need to stop eating or drinking the same fluids you normally would, but you may need to eat or drink less than usual.  . Your registered dietitian nutritionist will help you determine the correct amount of fluid to consume during the day Breakfast Include fluids taken with medications  Lunch Include fluids taken with medications  Dinner Include fluids taken with medications  Bedtime Snack Include fluids taken with medications     Tips What Are Fluids?  A fluid is anything  that is liquid or anything that would melt if left at room temperature. You will need to count these foods and liquids--including any liquid used to take medication--as part of your daily fluid intake. Some examples are: . Alcohol (drink only with your doctor's permission)  . Coffee, tea, and other hot beverages  . Gelatin (Jell-O)  . Gravy  . Ice cream, sherbet, sorbet  . Ice cubes, ice chips  . Milk, liquid creamer  . Nutritional supplements  . Popsicles  . Vegetable and fruit juices; fluid in canned fruit  . Watermelon  . Yogurt  . Soft drinks, lemonade, limeade  . Soups  . Syrup How Do I Measure My Fluid Intake? Marland Kitchen Record your fluid intake daily.  . Tip: Every day, each time you eat or drink fluids, pour water in the same amount into an empty container that can hold the same amount of fluids you are allowed daily. This may help you keep track of how much fluid you are taking in throughout the day.  . To accurately keep track of how much liquid you take in, measure the size of the cups, glasses, and bowls you use. If you eat soup, measure how much of it is liquid and how much is solid (such as noodles, vegetables, meat). Conversions for Measuring Fluid Intake  Milliliters (mL) Liters (L) Ounces (oz) Cups (c)  1000 1 32 4  1200 1.2 40 5  1500 1.5 50 6 1/4  1800 1.8 60 7 1/2  2000 2 67 8 1/3  Tips to Reduce Your Thirst . Chew gum or suck  on hard candy.  . Rinse or gargle with mouthwash. Do not swallow.  . Ice chips or popsicles my help quench thirst, but this too needs to be calculated into the total restriction. Melt ice chips or cubes first to figure out how much fluid they produce (for example, experiment with melting  cup ice chips or 2 ice cubes).  . Add a lemon wedge to your water.  . Limit how much salt you take in. A high salt intake might make you thirstier.  . Don't eat or drink all your allowed liquids at once. Space your liquids out through the day.  . Use small glasses  and cups and sip slowly. If allowed, take your medications with fluids you eat or drink during a meal.   Fluid-Restricted Nutrition Therapy Sample 1-Day Menu  Breakfast 1 slice wheat toast  1 tablespoon peanut butter  1/2 cup yogurt (120 milliliters)  1/2 cup blueberries  1 cup milk (240 milliliters)   Lunch 3 ounces sliced [place]  2 slices whole wheat bread  1/2 cup lettuce for sandwich  2 slices tomato for sandwich  1 ounce reduced-fat, reduced-sodium cheese  1/2 cup fresh carrot sticks  1 banana  1 cup unsweetened tea (240 milliliters)   Evening Meal 8 ounces soup (240 milliliters)  3 ounces salmon  1/2 cup quinoa  1 cup green beans  1 cup mixed greens salad  1 tablespoon olive oil  1 cup coffee (240 milliliters)  Evening Snack 1/2 cup sliced peaches  1/2 cup frozen yogurt (120 milliliters)  1 cup water (240 milliliters)  Copyright 2020  Academy of Nutrition and Dietetics. All rights reserved

## 2021-01-07 NOTE — Evaluation (Signed)
Occupational Therapy Evaluation Patient Details Name: Beverly Martin MRN: [id number] DOB: Dec 05, 1955 Today's Date: 01/07/2021    History of Present Illness 65 y.o. female presents to [facility] ED on 01/03/2021 with complaint of BLE swelling and orthopnea. PMH includes hypertension, diabetes type 2, CAD, CABG, chronic bilateral pedal edema.   Clinical Impression   Pt PTA: Pt living with spouse and pt works full time. Pt was independent and driving. Pt currently, independent with ADL and mobility in room and hallway. Pt >300' with no physical assist for ADL functional mobility in hallway with no AD. Pt's VSS. Pt with no c/o dizziness or SOB. Energy conservation technique education performed and pt recalled information from previous sessions. Pt does not require continued OT skilled services. OT signing off, thank you.      Follow Up Recommendations  No OT follow up    Equipment Recommendations  None recommended by OT    Recommendations for Other Services       Precautions / Restrictions Precautions Precautions: None Restrictions Weight Bearing Restrictions: No      Mobility Bed Mobility               General bed mobility comments: pt in bed pre and post session    Transfers Overall transfer level: Independent Equipment used: None                  Balance Overall balance assessment: Independent                                         ADL either performed or assessed with clinical judgement   ADL Overall ADL's : Modified independent;At baseline                                       General ADL Comments: No physical assist. No assist for ADL/iADL. VSS.     Vision Baseline Vision/History: No visual deficits Vision Assessment?: No apparent visual deficits     Perception     Praxis      Pertinent Vitals/Pain Pain Assessment: No/denies pain     Hand Dominance Right   Extremity/Trunk Assessment Upper Extremity  Assessment Upper Extremity Assessment: Overall WFL for tasks assessed   Lower Extremity Assessment Lower Extremity Assessment: Overall WFL for tasks assessed   Cervical / Trunk Assessment Cervical / Trunk Assessment: Normal   Communication Communication Communication: No difficulties   Cognition Arousal/Alertness: Awake/alert Behavior During Therapy: WFL for tasks assessed/performed Overall Cognitive Status: Within Functional Limits for tasks assessed                                     General Comments  VSS on RA. >300' with no physical assist for ADL functional mobility in hallway with no AD.    Exercises     Shoulder Instructions      Home Living Family/patient expects to be discharged to:: Private residence Living Arrangements: Spouse/significant other Available Help at Discharge: Family;Available PRN/intermittently Type of Home:  (townhome) Home Access: Level entry     Home Layout: One level     Bathroom Shower/Tub: Tub/shower unit;Walk-in shower   Bathroom Toilet: Standard     Home Equipment: None  Prior Functioning/Environment Level of Independence: Independent        Comments: Works, standing all day.  Drives, Independent ADL  and walking.        OT Problem List:        OT Treatment/Interventions:      OT Goals(Current goals can be found in the care plan section) Acute Rehab OT Goals Patient Stated Goal: to go home OT Goal Formulation: All assessment and education complete, DC therapy Potential to Achieve Goals: Good  OT Frequency:     Barriers to D/C:            Co-evaluation              AM-PAC OT "6 Clicks" Daily Activity     Outcome Measure Help from another person eating meals?: None Help from another person taking care of personal grooming?: None Help from another person toileting, which includes using toliet, bedpan, or urinal?: None Help from another person bathing (including washing, rinsing,  drying)?: None Help from another person to put on and taking off regular upper body clothing?: None Help from another person to put on and taking off regular lower body clothing?: None 6 Click Score: 24   End of Session Nurse Communication: Mobility status  Activity Tolerance: Patient tolerated treatment well Patient left: in chair;with call bell/phone within reach  OT Visit Diagnosis: Unsteadiness on feet (R26.81)                Time: [id number] OT Time Calculation (min): 32 min Charges:  OT General Charges $OT Visit: 1 Visit OT Evaluation $OT Eval Moderate Complexity: 1 Mod OT Treatments $Therapeutic Activity: 8-22 mins  Jefferey Pica, OTR/L Acute Rehabilitation Services Pager: [phone] Office: [phone]   Caron Ode C 01/07/2021, 5:54 PM

## 2021-01-07 NOTE — Progress Notes (Signed)
Heart Failure Stewardship Pharmacist Progress Note   PCP: Billie Ruddy, MD PCP-Cardiologist: Mertie Moores, MD    HPI:  65 yo F with PMH of CHF, CAD s/p CABG, HTN, CKD IV, T2DM, and HLD. She presented to the ED on 01/03/21 with increasing shortness of breath and LE edema. She reportedly stopped taking furosemide and carvedilol about 2 weeks ago due to not being able to get it refilled by PCP or cardiology office. An ECHO was done on 01/03/21 and LVEF was reduced to 20-25% (was 45-50% in Aug 2021).  Current HF Medications: Carvedilol 6.25 mg BID BiDil 20/37.5 mg TID  Prior to admission HF Medications: None  Pertinent Lab Values: . Serum creatinine 2.93>3.53, BUN 60, Potassium 4.6, Sodium 141, BNP >4500  Vital Signs: . Weight: 141 lbs (admission weight: 143 lbs) . Blood pressure: 110/60s  . Heart rate: 70-80s  Medication Assistance / Insurance Benefits Check: Does the patient have prescription insurance?  Yes Type of insurance plan: Pharmacist, community  Outpatient Pharmacy:  Prior to admission outpatient pharmacy: [facility] Is the patient willing to use [facility] at discharge? Yes Is the patient willing to transition their outpatient pharmacy to utilize a [facility] outpatient pharmacy?   Pending    Assessment: 1. Acute on chronic systolic CHF (EF 0000000), due to ICM and medication noncompliance. NYHA class III symptoms. - Off IV lasix today, plan to transition to PO lasix tomorrow - Continue carvedilol 6.25 mg BID - Continue BiDil 20/37.5 mg TID - AKI / CKD limiting use of Entresto, spironolactone, and SGLT2i - may be able to optimize further pending improvement in renal function   Plan: 1) Medication changes recommended at this time: - Continue current regimen - GDMT limited by AKI on CKD  2) Patient assistance: - BiDil copay $100 per month - can enroll in monthly copay card to lower copay to $25 per month - Farxiga copay $15 per month - can enroll in monthly  copay card to lower copay to $0 per month - Entresto copay $70 per month - can enroll in monthly copay card to lower copay to $10 per 1-3 month supply  3)  Education  - To be completed prior to discharge  Kerby Nora, PharmD, BCPS Heart Failure Stewardship Pharmacist Phone [phone]

## 2021-01-07 NOTE — Progress Notes (Signed)
PROGRESS NOTE    Beverly Martin  [id number] DOB: January 15, 1956 DOA: 01/03/2021 PCP: Billie Ruddy, MD    Brief Narrative:  Mrs.Beverly Martin was admitted to the hospital with a working diagnosis of acute systolic heart failure exacerbation.  65 year old female past medical history for systolic heart failure, coronary artery disease status post bypass grafting, hypertension, type 2 diabetes mellitus, and dyslipidemia who presented with dyspnea and edema for about 7 days. Patient ran out of her medications about 2 weeks prior to hospitalization. Her symptoms have been associated with orthopnea and exertional dyspnea.  On her initial physical examination blood pressure 175/91, heart rate 92, respiratory rate 18, temperature 98.6, oxygen saturation 92% positive JVD, lungs with rales bilaterally, increased work of breathing, heart S1-S2, present, rhythmic, abdomen soft, positive pitting bilateral extremity edema up to thighs.  Sodium 143, potassium 4.8, chloride 107, bicarb 30, glucose 78, BUN 48, creatinine 2.58, BNP>4500, white count 6.9, hemoglobin 11.7, hematocrit 37.2, platelets 178. SARS COVID-19 negative. Urinalysis>300protein, 1.0 118 specific gravity, 21-50 white cells.  Chest radiograph with cardiomegaly, bilateral hilar vascular congestion, bilateral pleural effusions, upper zone right lower lobe plate atelectasis. EKG 92 bpm, rightward axis, normal intervals, sinus rhythm, upsloping ST depression with T wave inversions in lead II, lead III, aVF, V5-V6.  Patient placed on IV furosemide for aggressive diuresis.  Follow up echocardiogram with worsening LV systolic function    Assessment & Plan:   Principal Problem:   Acute systolic heart failure (HCC) Active Problems:   Essential hypertension   Type 2 diabetes mellitus with hyperlipidemia (HCC)   CAD (coronary artery disease)   Dyslipidemia   CHF (congestive heart failure) ([place])   CKD stage 4 due to type 2  diabetes mellitus ([place])   1. Acute on chronic systolic heart failure exacerbation.  Echocardiogram with worsening LV systolic dysfunction with EF down to 20 to 25%, LV with global hypokinesis, RV with preserved systolic function, estimated RSVP 42.5 mmHg. Elevated LA pressures.  Mild to moderate mitral regurgitation and moderate TR.   Her volume status has improved, urine out put over last 24 hrs is 1,700 ml. Systolic blood pressure [id number] mmHg this am.   Transition to oral furosemide in am, continue heart failure management with carvedilol and Bidil.   Continue holding on Ace inh, ARB or spironolactone due to not stable GFR.  Consult dietary for heart failure teaching.   2. HTN/ CAD. tolerating well carvedilol and hydralazine/ isosorbide combination for blood pressure control.   3.AKI onCKD stage4/ cardio-renal syndrome improved volume status, serum cr is up to 3,53 with K at 4,6 and serum bicarbonate at 34.  Transition to oral furosemide in am, avoid hypotension and nephrotoxic medications.  Close follow up of renal function as outpatient.   4. T2DM/ dyslipidemia Continue glucose cover and monitoring with insulin sliding scale. Fasting glucose this am 121 mg/dl. Holding on SGLT2 inh due to low GFR.  Continue with atorvastatin.   Status is: Inpatient  Remains inpatient appropriate because:IV treatments appropriate due to intensity of illness or inability to take PO   Dispo: The patient is from: Home              Anticipated d/c is to: Home              Patient currently is not medically stable to d/c.   Difficult to place patient No  DVT prophylaxis: Heparin   Code Status:   full  Family Communication:  No family at the bedside  Subjective: Patient is feeling better, dyspnea and lower extremity edema are improving, no nausea or vomiting, no chest pain.   Objective: Vitals:   01/06/21 2034 01/07/21 0254 01/07/21 0322 01/07/21 0830  BP: 129/71  107/61  113/61  Pulse: 79  83 90  Resp: '18  18 16  '$ Temp: 98.2 F (36.8 C)  98.6 F (37 C) 99.1 F (37.3 C)  TempSrc: Oral  Oral Oral  SpO2: 98%  95%   Weight:  64 kg    Height:        Intake/Output Summary (Last 24 hours) at 01/07/2021 1048 Last data filed at 01/07/2021 0601 Gross per 24 hour  Intake 358 ml  Output 1400 ml  Net -1042 ml   Filed Weights   01/05/21 0059 01/06/21 0042 01/07/21 0254  Weight: 65.3 kg 65.2 kg 64 kg    Examination:   General: Not in pain or dyspnea, deconditioned  Neurology: Awake and alert, non focal  E ENT: mild pallor, no icterus, oral mucosa moist Cardiovascular: mild JVD. S1-S2 present, rhythmic, no gallops, rubs, or murmurs. Trace pitting bilateral lower extremity edema. Pulmonary: positive breath sounds bilaterally, with no wheezing, rhonchi or rales. Gastrointestinal. Abdomen soft and non tender Skin. No rashes Musculoskeletal: no joint deformities     Data Reviewed: I have personally reviewed following labs and imaging studies  CBC: Recent Labs  Lab 01/03/21 1016  WBC 6.9  HGB 11.7*  HCT 37.2  MCV 101.6*  PLT 0000000   Basic Metabolic Panel: Recent Labs  Lab 01/03/21 1016 01/04/21 0400 01/05/21 0436 01/06/21 0405 01/07/21 0349  NA 143 141 141 141 141  K 4.8 4.7 4.7 4.6 4.6  CL 107 106 106 104 103  CO2 30 30 32 30 34*  GLUCOSE 78 69* 72 102* 121*  BUN 48* 52* 55* 57* 60*  CREATININE 2.58* 2.77* 3.08* 2.95* 3.53*  CALCIUM 8.9 8.4* 8.2* 8.2* 8.4*   GFR: Estimated Creatinine Clearance: 13.7 mL/min (A) (by C-G formula based on SCr of 3.53 mg/dL (H)). Liver Function Tests: Recent Labs  Lab 01/03/21 1016  AST 16  ALT 17  ALKPHOS 65  BILITOT 0.8  PROT 6.4*  ALBUMIN 3.7   No results for input(s): LIPASE, AMYLASE in the last 168 hours. No results for input(s): AMMONIA in the last 168 hours. Coagulation Profile: No results for input(s): INR, PROTIME in the last 168 hours. Cardiac Enzymes: No results for input(s):  CKTOTAL, CKMB, CKMBINDEX, TROPONINI in the last 168 hours. BNP (last 3 results) Recent Labs    12/06/20 0927  PROBNP >5000.0*   HbA1C: No results for input(s): HGBA1C in the last 72 hours. CBG: Recent Labs  Lab 01/06/21 0629 01/06/21 1325 01/06/21 1601 01/06/21 2120 01/07/21 0559  GLUCAP 85 171* 119* 174* 120*   Lipid Profile: No results for input(s): CHOL, HDL, LDLCALC, TRIG, CHOLHDL, LDLDIRECT in the last 72 hours. Thyroid Function Tests: No results for input(s): TSH, T4TOTAL, FREET4, T3FREE, THYROIDAB in the last 72 hours. Anemia Panel: No results for input(s): VITAMINB12, FOLATE, FERRITIN, TIBC, IRON, RETICCTPCT in the last 72 hours.    Radiology Studies: I have reviewed all of the imaging during this hospital visit personally     Scheduled Meds: . aspirin EC  81 mg Oral Daily  . atorvastatin  40 mg Oral Daily  . carvedilol  6.25 mg Oral BID WC  . furosemide  60 mg Intravenous BID  . heparin  5,000 Units Subcutaneous Q8H  . insulin aspart  0-9 Units  Subcutaneous TID WC  . isosorbide-hydrALAZINE  1 tablet Oral TID  . sodium chloride flush  3 mL Intravenous Q12H   Continuous Infusions: . sodium chloride       LOS: 4 days        Beverly Martin Gerome Apley, MD

## 2021-01-07 NOTE — Progress Notes (Signed)
Nutrition Education Note  RD consulted for nutrition education regarding new onset CHF.  Pt unavailable at time of visit. RD attempted to speak with pt via phone call to room, however, unable to reach.   RD provided "Low Sodium Nutrition Therapy" handout from the Academy of [employer] Dietetics. Attached to AVS/ discharge instructions.   Lab Results  Component Value Date   HGBA1C 6.4 12/06/2020   PTA DM medications are 1 mg glimepiride daily.   Labs reviewed: CBGS: 119-174 (inpatient orders for glycemic control are 0-9 units insulin aspart TID with meals).   Current diet order is heart healthy/ carb modified with 1.2 L fluid restriction, patient is consuming approximately 100% of meals at this time. Labs and medications reviewed. No further nutrition interventions warranted at this time. RD contact information provided. If additional nutrition issues arise, please re-consult RD.   Loistine Chance, RD, LDN, [place] Registered Dietitian II Certified Diabetes Care and Education Specialist Please refer to [facility] for RD and/or RD on-call/weekend/after hours pager

## 2021-01-08 ENCOUNTER — Other Ambulatory Visit: Payer: Self-pay | Admitting: Internal Medicine

## 2021-01-08 LAB — GLUCOSE, CAPILLARY
Glucose-Capillary: 133 mg/dL — ABNORMAL HIGH (ref 70–99)
Glucose-Capillary: 161 mg/dL — ABNORMAL HIGH (ref 70–99)

## 2021-01-08 LAB — BASIC METABOLIC PANEL
Anion gap: 8 (ref 5–15)
BUN: 57 mg/dL — ABNORMAL HIGH (ref 8–23)
CO2: 32 mmol/L (ref 22–32)
Calcium: 8.5 mg/dL — ABNORMAL LOW (ref 8.9–10.3)
Chloride: 102 mmol/L (ref 98–111)
Creatinine, Ser: 3.09 mg/dL — ABNORMAL HIGH (ref 0.44–1.00)
GFR, Estimated: 16 mL/min — ABNORMAL LOW (ref >60)
Glucose, Bld: 135 mg/dL — ABNORMAL HIGH (ref 70–99)
Potassium: 4.5 mmol/L (ref 3.5–5.1)
Sodium: 142 mmol/L (ref 135–145)

## 2021-01-08 MED ORDER — TORSEMIDE 40 MG PO TABS: 40.0000 mg | ORAL_TABLET | Freq: Every day | ORAL | 0 refills | Status: DC

## 2021-01-08 MED ORDER — ISOSORB DINITRATE-HYDRALAZINE 20-37.5 MG PO TABS: 1.0000 | ORAL_TABLET | Freq: Three times a day (TID) | ORAL | 0 refills | Status: DC

## 2021-01-08 MED ORDER — TORSEMIDE 20 MG PO TABS
40.0000 mg | ORAL_TABLET | Freq: Every day | ORAL | Status: DC
  Administered 2021-01-08: 40 mg via ORAL
  Filled 2021-01-08: qty 2

## 2021-01-08 MED ORDER — CARVEDILOL 6.25 MG PO TABS: 6.2500 mg | ORAL_TABLET | Freq: Two times a day (BID) | ORAL | 0 refills | Status: DC

## 2021-01-08 MED FILL — TORSEMIDE 20 MG TABLET: 20 | 30 days supply | Qty: 60 | Fill #0

## 2021-01-08 MED FILL — CARVEDILOL 6.25 MG TABLET: 6.25 | 30 days supply | Qty: 60 | Fill #0

## 2021-01-08 MED FILL — BIDIL 20-37.5 MG TABS: 20-37.5 | 30 days supply | Qty: 90 | Fill #0

## 2021-01-08 NOTE — Progress Notes (Signed)
Heart Failure Nurse Navigator Progress Note  Set up HV TOC appt for Friday 01/11/21 @ 9AM per pt request. Educated on new medication regimen, directions to clinic appt and confirmed transportation-pt states her husband will bring her. Gave appt card and welcome letter for [facility] clinic.   Pricilla Holm, RN, BSN Heart Failure Nurse Navigator [phone]

## 2021-01-08 NOTE — Plan of Care (Signed)
Nutrition Education Note  RD consulted for nutrition education regarding new onset CHF.  Case discussed with RN, who reports MD is requesting RD speak with pt prior to discharge today.   Spoke with pt via call to hospital room phone. She reports she has a good appetite and is ready to go home today. She a scale at home and weighs daily; UBW 123-124#.   Pt reports she has struggled with food choices since going back to work. She shares breakfast is her worst meal (usually crackers and cranberry juice) as she has to be out of the house early in the morning and needs to take her medications.   Per pt, she is not cooking as much as she used to and often goes to restaurants such as [employer] and fast food. Meal comprise of meat (usually fish or chicken, vegetable, and starch. She does not add salt to the table, but does admit to using a little during the cooking process. Focus of education was on self-management behaviors and healthier choices when eating out.   RD provided "Low Sodium Nutrition Therapy" handout from the Academy of Nutrition and Dietetics. Reviewed patient's dietary recall. Provided examples on ways to decrease sodium intake in diet. Discouraged intake of processed foods and use of salt shaker. Encouraged fresh fruits and vegetables as well as whole grain sources of carbohydrates to maximize fiber intake.   RD discussed why it is important for patient to adhere to diet recommendations, and emphasized the role of fluids, foods to avoid, and importance of weighing self daily. Teach back method used.  Expect fair to good compliance.  Current diet order is heart healthy/ carb modified, patient is consuming approximately 100% of meals at this time. Labs and medications reviewed. No further nutrition interventions warranted at this time. RD contact information provided. If additional nutrition issues arise, please re-consult RD.   Loistine Chance, RD, LDN, [place] Registered Dietitian II Certified  Diabetes Care and Education Specialist Please refer to [facility] for RD and/or RD on-call/weekend/after hours pager

## 2021-01-08 NOTE — Discharge Summary (Addendum)
Physician Discharge Summary  Beverly Martin BMW:[id number] DOB: December 19, 1955 DOA: 01/03/2021  PCP: Billie Ruddy, MD  Admit date: 01/03/2021 Discharge date: 01/08/2021  Admitted From: Home  Disposition:  Home   Recommendations for Outpatient Follow-up and new medication changes:  1. Follow up with Dr. Volanda Napoleon in 7 days.  2. Follow up on renal function in 7 days.  3. Furosemide was changed to torsemide.  4. Added Bidil for afterload reduction.  5. Follow with cardiology as scheduled.  6. Holding glimepiride to prevent hypoglycemia.   Home Health: no   Equipment/Devices: na     Discharge Condition: stable  CODE STATUS: full Diet recommendation:  Heart healthy   Brief/Interim Summary: Mrs.Negretewas admitted to the hospital with a working diagnosis of acute systolic heart failure exacerbation.  65 year old female past medical history for systolic heart failure, coronary artery disease status post bypass grafting, hypertension, type 2 diabetes mellitus, and dyslipidemia who presented with dyspnea and edema for about 7 days. Patient ran out of her medications about 2 weeks prior to hospitalization. Her symptoms have been associated with orthopnea and exertional dyspnea.  On her initial physical examination blood pressure 175/91, heart rate 92, respiratory rate 18, temperature 98.6, oxygen saturation 92% positive JVD, lungs with rales bilaterally, increased work of breathing, heart S1-S2, present, rhythmic, abdomen soft, positive pitting bilateral extremity edema up to thighs.  Sodium 143, potassium 4.8, chloride 107, bicarb 30, glucose 78, BUN 48, creatinine 2.58, BNP>4500, white count 6.9, hemoglobin 11.7, hematocrit 37.2, platelets 178. SARS COVID-19 negative. Urinalysis>300protein, 1.018 specific gravity, 21-50 white cells.  Chest radiograph with cardiomegaly, bilateral hilar vascular congestion, bilateral pleural effusions, upper zone right lower lobe plate like  atelectasis. EKG 92 bpm, rightward axis, normal intervals, sinus rhythm, upsloping ST depression with T wave inversions in lead II, lead III, aVF, V5-V6.  Patient placed on IV furosemide for aggressive diuresis with good toleration.  Follow up echocardiogram with worsening LV systolic function  1. Acute on chronic systolic heart failure exacerbation. Patient was admitted to the cardiac telemetry unit, underwent aggressive diuresis with intravenous furosemide, negative fluid balance was achieved (-3,856 ml since admission) with significant improvement of her symptoms.  Further work-up with echocardiography showed worsening LV systolic function, ejection fraction down to 20 to 25%, LV with global hypokinesis, RV with preserved systolic function, estimated RSVP 42.5 mm of mercury, elevated LA pressures.  Mild to moderate tricuspid vegetation.  Patient was continued on carvedilol for beta blockade and placed on BiDil for afterload reduction with good toleration.  Currently not on ACE inhibitor, ARB or spironolactone, on hold until GFR more stable. Continue diuretic therapy with torsemide.  Patient received heart failure education.   2.  Hypertension/coronary artery disease.  Patient with no chest pain, continue carvedilol, along with blood pressure control.  3.  Acute kidney injury chronic kidney disease stage IV, cardiorenal syndrome.  Patient received intravenous furosemide, peak creatinine 3.53, at discharge is down to 3.09, sodium 142, potassium 4.5, chloride 106, bicarb 32.  Patient continue diuresis with torsemide, follow-up kidney function as an outpatient.  4.  Type 2 diabetes mellitus, dyslipidemia.  Her glucose remained stable, she received insulin sliding scale for glucose coverage and monitoring. Patient not on SGLT2 inhibitors due to low GFR. Hold on glimepiride due to risk of hypoglycemia.   Continue atorvastatin.  Discharge Diagnoses:  Principal Problem:   Acute systolic  heart failure (HCC) Active Problems:   Essential hypertension   Type 2 diabetes mellitus with hyperlipidemia ([place])  CAD (coronary artery disease)   Dyslipidemia   CHF (congestive heart failure) ([place])   CKD stage 4 due to type 2 diabetes mellitus [facility])    Discharge Instructions   Allergies as of 01/08/2021   No Known Allergies     Medication List    STOP taking these medications   furosemide 80 MG tablet Commonly known as: LASIX   glimepiride 1 MG tablet Commonly known as: AMARYL     TAKE these medications   aspirin 81 MG EC tablet Take 1 tablet (81 mg total) by mouth daily. Swallow whole.   atorvastatin 40 MG tablet Commonly known as: LIPITOR TAKE 1 TABLET BY MOUTH EVERY DAY   blood glucose meter kit and supplies Kit Dispense based on patient and insurance preference. Use up to four times daily as directed. (FOR ICD-9 250.00, 250.01).   Blood Pressure Kit Use as directed for checking BP daily.   calcitRIOL 0.25 MCG capsule Commonly known as: ROCALTROL TAKE 1 CAPSULE BY MOUTH EVERY DAY What changed: how much to take   carvedilol 6.25 MG tablet Commonly known as: COREG Take 1 tablet (6.25 mg total) by mouth 2 (two) times daily with a meal.   [employer] Lancets Micro Thin 33G Misc Use as directed for testing blood sugar twice daily, more frequently for hyper or hypoglycemia.   isosorbide-hydrALAZINE 20-37.5 MG tablet Commonly known as: BIDIL Take 1 tablet by mouth 3 (three) times daily.   Torsemide 40 MG Tabs Take 40 mg by mouth daily.       No Known Allergies      Procedures/Studies: DG Chest 2 View  Result Date: 01/03/2021 CLINICAL DATA:  Bilateral leg swelling and shortness of breath for a few days EXAM: CHEST - 2 VIEW COMPARISON:  06/09/2020 FINDINGS: Small pleural effusions which are unchanged. Pulmonary vascular congestion. Scarring versus fluid retention at the right minor fissure. Cardiomegaly.  CABG. IMPRESSION: Vascular congestion and small  pleural effusions similar to 2021. Electronically Signed   By: Monte Fantasia M.D.   On: 01/03/2021 11:08   ECHOCARDIOGRAM COMPLETE  Result Date: 01/03/2021    ECHOCARDIOGRAM REPORT   Patient Name:   Beverly Martin Date of Exam: 01/03/2021 Medical Rec #:  [id number]      Height:       64.0 in Accession #:    [id number]     Weight:       144.8 lb Date of Birth:  December 20, 1955      BSA:          1.705 m Patient Age:    65 years       BP:           174/98 mmHg Patient Gender: F              HR:           91 bpm. Exam Location:  Inpatient Procedure: 2D Echo, Cardiac Doppler and Color Doppler Indications:    CHF-Acute Systolic  History:        Patient has prior history of Echocardiogram examinations, most                 recent 06/10/2020. CHF, CAD, Prior CABG, Signs/Symptoms:Shortness                 of Breath; Risk Factors:Hypertension, Diabetes and Dyslipidemia.                 Orthopnea.  Sonographer:    Clayton Lefort RDCS (AE) Referring  Phys: [id number] Lequita Halt  Sonographer Comments: Definity use attempted. Definity did not appear in LV after waiting two minutes. Further attempts discontinued. IMPRESSIONS  1. Since the last study on 06/10/2020 LVEF has decreased from 45% to 20-25%, risght ventricular systolic function is severely decreased.  2. Left ventricular ejection fraction, by estimation, is 20 to 25%. The left ventricle has severely decreased function. The left ventricle demonstrates global hypokinesis. There is moderate concentric left ventricular hypertrophy. Left ventricular diastolic parameters are consistent with Grade I diastolic dysfunction (impaired relaxation). Elevated left atrial pressure.  3. Right ventricular systolic function is normal. The right ventricular size is normal. There is mildly elevated pulmonary artery systolic pressure. The estimated right ventricular systolic pressure is 73.4 mmHg.  4. The mitral valve is normal in structure. Mild to moderate mitral valve regurgitation. No evidence  of mitral stenosis.  5. Tricuspid valve regurgitation is moderate.  6. The aortic valve is normal in structure. Aortic valve regurgitation is not visualized. No aortic stenosis is present.  7. The inferior vena cava is dilated in size with <50% respiratory variability, suggesting right atrial pressure of 15 mmHg. FINDINGS  Left Ventricle: Left ventricular ejection fraction, by estimation, is 20 to 25%. The left ventricle has severely decreased function. The left ventricle demonstrates global hypokinesis. The left ventricular internal cavity size was normal in size. There is moderate concentric left ventricular hypertrophy. Left ventricular diastolic parameters are consistent with Grade I diastolic dysfunction (impaired relaxation). Elevated left atrial pressure. Right Ventricle: The right ventricular size is normal. No increase in right ventricular wall thickness. Right ventricular systolic function is normal. There is mildly elevated pulmonary artery systolic pressure. The tricuspid regurgitant velocity is 2.62  m/s, and with an assumed right atrial pressure of 15 mmHg, the estimated right ventricular systolic pressure is 28.7 mmHg. Left Atrium: Left atrial size was normal in size. Right Atrium: Right atrial size was normal in size. Pericardium: There is no evidence of pericardial effusion. Mitral Valve: The mitral valve is normal in structure. Mild to moderate mitral valve regurgitation. No evidence of mitral valve stenosis. MV peak gradient, 3.2 mmHg. The mean mitral valve gradient is 1.0 mmHg. Tricuspid Valve: The tricuspid valve is normal in structure. Tricuspid valve regurgitation is moderate . No evidence of tricuspid stenosis. Aortic Valve: The aortic valve is normal in structure. Aortic valve regurgitation is not visualized. No aortic stenosis is present. Aortic valve mean gradient measures 2.0 mmHg. Aortic valve peak gradient measures 3.4 mmHg. Aortic valve area, by VTI measures 1.75 cm. Pulmonic Valve:  The pulmonic valve was normal in structure. Pulmonic valve regurgitation is mild. No evidence of pulmonic stenosis. Aorta: The aortic root is normal in size and structure. Venous: The inferior vena cava is dilated in size with less than 50% respiratory variability, suggesting right atrial pressure of 15 mmHg. IAS/Shunts: No atrial level shunt detected by color flow Doppler.  LEFT VENTRICLE PLAX 2D LVIDd:         4.70 cm  Diastology LVIDs:         4.30 cm  LV e' medial:    3.44 cm/s LV PW:         1.40 cm  LV E/e' medial:  21.3 LV IVS:        1.40 cm  LV e' lateral:   3.37 cm/s LVOT diam:     1.80 cm  LV E/e' lateral: 21.8 LV SV:         30 LV SV Index:  17 LVOT Area:     2.54 cm  RIGHT VENTRICLE            IVC RV Basal diam:  3.40 cm    IVC diam: 1.60 cm RV S prime:     5.35 cm/s TAPSE (M-mode): 0.8 cm LEFT ATRIUM             Index       RIGHT ATRIUM           Index LA diam:        3.60 cm 2.11 cm/m  RA Area:     12.90 cm LA Vol (A2C):   39.9 ml 23.40 ml/m RA Volume:   31.30 ml  18.35 ml/m LA Vol (A4C):   36.4 ml 21.34 ml/m LA Biplane Vol: 38.6 ml 22.63 ml/m  AORTIC VALVE AV Area (Vmax):    1.92 cm AV Area (Vmean):   1.71 cm AV Area (VTI):     1.75 cm AV Vmax:           91.60 cm/s AV Vmean:          64.900 cm/s AV VTI:            0.169 m AV Peak Grad:      3.4 mmHg AV Mean Grad:      2.0 mmHg LVOT Vmax:         69.00 cm/s LVOT Vmean:        43.500 cm/s LVOT VTI:          0.116 m LVOT/AV VTI ratio: 0.69  AORTA Ao Root diam: 2.60 cm Ao Asc diam:  2.80 cm MITRAL VALVE               TRICUSPID VALVE MV Area (PHT): 6.12 cm    TR Peak grad:   27.5 mmHg MV Area VTI:   1.26 cm    TR Vmax:        262.00 cm/s MV Peak grad:  3.2 mmHg MV Mean grad:  1.0 mmHg    SHUNTS MV Vmax:       0.89 m/s    Systemic VTI:  0.12 m MV Vmean:      56.2 cm/s   Systemic Diam: 1.80 cm MV Decel Time: 124 msec MV E velocity: 73.30 cm/s MV A velocity: 82.30 cm/s MV E/A ratio:  0.89 Tobias Alexander MD Electronically signed by Tobias Alexander MD Signature Date/Time: 01/03/2021/4:09:07 PM    Final        Subjective: Patient is feeling better, no nausea or vomiting, dyspnea and lower extremity edema is continue to improve.   Discharge Exam: Vitals:   01/07/21 1638 01/07/21 2104  BP: (!) 145/75 125/63  Pulse: 77 80  Resp:  18  Temp:  98.2 F (36.8 C)  SpO2:  94%   Vitals:   01/07/21 0830 01/07/21 1638 01/07/21 2104 01/08/21 0500  BP: 113/61 (!) 145/75 125/63   Pulse: 90 77 80   Resp: 16  18   Temp: 99.1 F (37.3 C)  98.2 F (36.8 C)   TempSrc: Oral  Oral   SpO2:   94%   Weight:    63.5 kg  Height:        General: Not in pain or dyspnea  Neurology: Awake and alert, non focal  E ENT: no pallor, no icterus, oral mucosa moist Cardiovascular: No JVD. S1-S2 present, rhythmic, no gallops, rubs, or murmurs. Trace pitting bilateral lower extremity edema. Pulmonary: positive breath sounds bilaterally,  with no wheezing, rhonchi or rales. Gastrointestinal. Abdomen soft and non tender Skin. No rashes Musculoskeletal: no joint deformities   The results of significant diagnostics from this hospitalization (including imaging, microbiology, ancillary and laboratory) are listed below for reference.     Microbiology: Recent Results (from the past 240 hour(s))  Resp Panel by RT-PCR (Flu A&B, Covid) Nasopharyngeal Swab     Status: None   Collection Time: 01/03/21 12:26 PM   Specimen: Nasopharyngeal Swab; Nasopharyngeal(NP) swabs in vial transport medium  Result Value Ref Range Status   SARS Coronavirus 2 by RT PCR NEGATIVE NEGATIVE Final    Comment: (NOTE) SARS-CoV-2 target nucleic acids are NOT DETECTED.  The SARS-CoV-2 RNA is generally detectable in upper respiratory specimens during the acute phase of infection. The lowest concentration of SARS-CoV-2 viral copies this assay can detect is 138 copies/mL. A negative result does not preclude SARS-Cov-2 infection and should not be used as the sole basis for  treatment or other patient management decisions. A negative result may occur with  improper specimen collection/handling, submission of specimen other than nasopharyngeal swab, presence of viral mutation(s) within the areas targeted by this assay, and inadequate number of viral copies(<138 copies/mL). A negative result must be combined with clinical observations, patient history, and epidemiological information. The expected result is Negative.  Fact Sheet for Patients:  EntrepreneurPulse.com.au  Fact Sheet for Healthcare Providers:  IncredibleEmployment.be  This test is no t yet approved or cleared by the [place] FDA and  has been authorized for detection and/or diagnosis of SARS-CoV-2 by FDA under an Emergency Use Authorization (EUA). This EUA will remain  in effect (meaning this test can be used) for the duration of the COVID-19 declaration under Section 564(b)(1) of the Act, 21 U.S.C.section 360bbb-3(b)(1), unless the authorization is terminated  or revoked sooner.       Influenza A by PCR NEGATIVE NEGATIVE Final   Influenza B by PCR NEGATIVE NEGATIVE Final    Comment: (NOTE) The Xpert Xpress SARS-CoV-2/FLU/RSV plus assay is intended as an aid in the diagnosis of influenza from Nasopharyngeal swab specimens and should not be used as a sole basis for treatment. Nasal washings and aspirates are unacceptable for Xpert Xpress SARS-CoV-2/FLU/RSV testing.  Fact Sheet for Patients: EntrepreneurPulse.com.au  Fact Sheet for Healthcare Providers: IncredibleEmployment.be  This test is not yet approved or cleared by the [place] FDA and has been authorized for detection and/or diagnosis of SARS-CoV-2 by FDA under an Emergency Use Authorization (EUA). This EUA will remain in effect (meaning this test can be used) for the duration of the COVID-19 declaration under Section 564(b)(1) of the Act, 21  U.S.C. section 360bbb-3(b)(1), unless the authorization is terminated or revoked.  Performed at [facility], [place] [address] [place], [place] [address]      Labs: BNP (last 3 results) Recent Labs    06/21/20 1102 07/11/20 0321 01/03/21 1016  BNP 1,370* 1,575* >6,203.5*   Basic Metabolic Panel: Recent Labs  Lab 01/04/21 0400 01/05/21 0436 01/06/21 0405 01/07/21 0349 01/08/21 0400  NA 141 141 141 141 142  K 4.7 4.7 4.6 4.6 4.5  CL 106 106 104 103 102  CO2 30 32 30 34* 32  GLUCOSE 69* 72 102* 121* 135*  BUN 52* 55* 57* 60* 57*  CREATININE 2.77* 3.08* 2.95* 3.53* 3.09*  CALCIUM 8.4* 8.2* 8.2* 8.4* 8.5*   Liver Function Tests: Recent Labs  Lab 01/03/21 1016  AST 16  ALT 17  ALKPHOS 65  BILITOT  0.8  PROT 6.4*  ALBUMIN 3.7   No results for input(s): LIPASE, AMYLASE in the last 168 hours. No results for input(s): AMMONIA in the last 168 hours. CBC: Recent Labs  Lab 01/03/21 1016  WBC 6.9  HGB 11.7*  HCT 37.2  MCV 101.6*  PLT 178   Cardiac Enzymes: No results for input(s): CKTOTAL, CKMB, CKMBINDEX, TROPONINI in the last 168 hours. BNP: Invalid input(s): POCBNP CBG: Recent Labs  Lab 01/07/21 0559 01/07/21 1221 01/07/21 1629 01/07/21 2106 01/08/21 0548  GLUCAP 120* 123* 105* 214* 133*   D-Dimer No results for input(s): DDIMER in the last 72 hours. Hgb A1c No results for input(s): HGBA1C in the last 72 hours. Lipid Profile No results for input(s): CHOL, HDL, LDLCALC, TRIG, CHOLHDL, LDLDIRECT in the last 72 hours. Thyroid function studies No results for input(s): TSH, T4TOTAL, T3FREE, THYROIDAB in the last 72 hours.  Invalid input(s): FREET3 Anemia work up No results for input(s): VITAMINB12, FOLATE, FERRITIN, TIBC, IRON, RETICCTPCT in the last 72 hours. Urinalysis    Component Value Date/Time   COLORURINE YELLOW 01/03/2021 1428   APPEARANCEUR HAZY (A) 01/03/2021 1428   LABSPEC 1.018 01/03/2021 1428   PHURINE 6.0 01/03/2021 1428    GLUCOSEU NEGATIVE 01/03/2021 1428   HGBUR SMALL (A) 01/03/2021 1428   BILIRUBINUR NEGATIVE 01/03/2021 1428   KETONESUR NEGATIVE 01/03/2021 1428   PROTEINUR >=300 (A) 01/03/2021 1428   UROBILINOGEN 0.2 10/20/2017 1115   NITRITE NEGATIVE 01/03/2021 1428   LEUKOCYTESUR TRACE (A) 01/03/2021 1428   Sepsis Labs Invalid input(s): PROCALCITONIN,  WBC,  LACTICIDVEN Microbiology Recent Results (from the past 240 hour(s))  Resp Panel by RT-PCR (Flu A&B, Covid) Nasopharyngeal Swab     Status: None   Collection Time: 01/03/21 12:26 PM   Specimen: Nasopharyngeal Swab; Nasopharyngeal(NP) swabs in vial transport medium  Result Value Ref Range Status   SARS Coronavirus 2 by RT PCR NEGATIVE NEGATIVE Final    Comment: (NOTE) SARS-CoV-2 target nucleic acids are NOT DETECTED.  The SARS-CoV-2 RNA is generally detectable in upper respiratory specimens during the acute phase of infection. The lowest concentration of SARS-CoV-2 viral copies this assay can detect is 138 copies/mL. A negative result does not preclude SARS-Cov-2 infection and should not be used as the sole basis for treatment or other patient management decisions. A negative result may occur with  improper specimen collection/handling, submission of specimen other than nasopharyngeal swab, presence of viral mutation(s) within the areas targeted by this assay, and inadequate number of viral copies(<138 copies/mL). A negative result must be combined with clinical observations, patient history, and epidemiological information. The expected result is Negative.  Fact Sheet for Patients:  BloggerCourse.com  Fact Sheet for Healthcare Providers:  SeriousBroker.it  This test is no t yet approved or cleared by the [place] FDA and  has been authorized for detection and/or diagnosis of SARS-CoV-2 by FDA under an Emergency Use Authorization (EUA). This EUA will remain  in effect (meaning this  test can be used) for the duration of the COVID-19 declaration under Section 564(b)(1) of the Act, 21 U.S.C.section 360bbb-3(b)(1), unless the authorization is terminated  or revoked sooner.       Influenza A by PCR NEGATIVE NEGATIVE Final   Influenza B by PCR NEGATIVE NEGATIVE Final    Comment: (NOTE) The Xpert Xpress SARS-CoV-2/FLU/RSV plus assay is intended as an aid in the diagnosis of influenza from Nasopharyngeal swab specimens and should not be used as a sole basis for treatment. Nasal washings and aspirates  are unacceptable for Xpert Xpress SARS-CoV-2/FLU/RSV testing.  Fact Sheet for Patients: EntrepreneurPulse.com.au  Fact Sheet for Healthcare Providers: IncredibleEmployment.be  This test is not yet approved or cleared by the [place] FDA and has been authorized for detection and/or diagnosis of SARS-CoV-2 by FDA under an Emergency Use Authorization (EUA). This EUA will remain in effect (meaning this test can be used) for the duration of the COVID-19 declaration under Section 564(b)(1) of the Act, 21 U.S.C. section 360bbb-3(b)(1), unless the authorization is terminated or revoked.  Performed at [facility], [place] [address] [place], [place] [address]      Time coordinating discharge: 45 minutes  SIGNED:   Tawni Millers, MD  [facility] 01/08/2021, 8:35 AM

## 2021-01-08 NOTE — Progress Notes (Signed)
Heart Failure Stewardship Pharmacist Progress Note   PCP: Billie Ruddy, MD PCP-Cardiologist: Mertie Moores, MD    HPI:  65 yo F with PMH of CHF, CAD s/p CABG, HTN, CKD IV, T2DM, and HLD. She presented to the ED on 01/03/21 with increasing shortness of breath and LE edema. She reportedly stopped taking furosemide and carvedilol about 2 weeks ago due to not being able to get it refilled by PCP or cardiology office. An ECHO was done on 01/03/21 and LVEF was reduced to 20-25% (was 45-50% in Aug 2021).  Discharge HF Medications: Torsemide 40 mg daily Carvedilol 6.25 mg BID BiDil 20/37.5 mg TID  Prior to admission HF Medications: None  Pertinent Lab Values: . Serum creatinine 3.09, BUN 57, Potassium 4.5, Sodium 142, BNP >4500  Vital Signs: . Weight: 139 lbs (admission weight: 143 lbs) . Blood pressure: 110/60s  . Heart rate: 70-80s  Medication Assistance / Insurance Benefits Check: Does the patient have prescription insurance?  Yes Type of insurance plan: Pharmacist, community  Outpatient Pharmacy:  Prior to admission [facility] Is the patient willing to use [facility] at discharge? Yes Is the patient willing to transition their outpatient pharmacy to utilize a [facility] outpatient pharmacy?   Pending    Assessment: 1. Acute on chronic systolic CHF (EF 0000000), due to ICM and medication noncompliance. NYHA class II symptoms. - Continue torsemide 40 mg daily at discharge - Continue carvedilol 6.25 mg BID - Continue BiDil 20/37.5 mg TID - AKI / CKD limiting use of Entresto, spironolactone, and SGLT2i - may be able to optimize further pending improvement in renal function   Plan: 1) Medication changes recommended at this time: - Continue current regimen - GDMT limited by AKI on CKD  2) Patient assistance: - BiDil copay $100 per month - can enroll in monthly copay card to lower copay to $25 per month - will complete in [facility] clinic. Will receive first 30-day  free supply from [facility] clinic. - Farxiga copay $15 per month - can enroll in monthly copay card to lower copay to $0 per month - Entresto copay $70 per month - can enroll in monthly copay card to lower copay to $10 per 1-3 month supply  3)  Education  - Patient has been educated on current HF medications and potential additions to HF medication regimen - Patient verbalizes understanding that over the next few months, these medication doses may change and more medications may be added to optimize HF regimen - Patient has been educated on basic disease state pathophysiology and goals of therapy    Kerby Nora, PharmD, BCPS Heart Failure Stewardship Pharmacist Phone [phone]

## 2021-01-09 ENCOUNTER — Telehealth: Payer: Self-pay | Admitting: Family Medicine

## 2021-01-09 ENCOUNTER — Telehealth: Payer: Self-pay

## 2021-01-09 NOTE — Telephone Encounter (Signed)
The patient called back and I transferred the call to Randel Pigg and [employer].  The patients question about the medication was for the Health Coach.

## 2021-01-09 NOTE — Telephone Encounter (Signed)
Transition Care Management Follow-up Telephone Call  Date of discharge and from where: 01/08/2021 from [facility]  How have you been since you were released from the hospital? Doing well  Any questions or concerns? No  Items Reviewed:  Did the pt receive and understand the discharge instructions provided? No   Medications obtained and verified? Yes   Other? No   Any new allergies since your discharge? No   Dietary orders reviewed? Yes  Do you have support at home? Yes   Home Care and Equipment/Supplies: Were home health services ordered? no If so, what is the name of the agency? N/A  Has the agency set up a time to come to the patient's home? not applicable Were any new equipment or medical supplies ordered?  No What is the name of the medical supply agency? N/A Were you able to get the supplies/equipment? not applicable Do you have any questions related to the use of the equipment or supplies? No  Functional Questionnaire: (I = Independent and D = Dependent) ADLs: N/A  Bathing/Dressing- no  Meal Prep- N/A  Eating- no  Maintaining continence- no  Transferring/Ambulation- no  Managing Meds- no  Follow up appointments reviewed:   PCP Hospital f/u appt confirmed? Yes  Scheduled to see Dr. Volanda Napoleon on 01/16/2021 @ 1100.  [facility] f/u appt confirmed? Yes  Scheduled to see Heart and Vascular 01/11/2021 on  @ 0900  Are transportation arrangements needed? No   If their condition worsens, is the pt aware to call PCP or go to the Emergency Dept.? Yes  Was the patient provided with contact information for the PCP's office or ED? Yes  Was to pt encouraged to call back with questions or concerns? Yes

## 2021-01-09 NOTE — Telephone Encounter (Signed)
Pt called back in to the office to speak with whomever she was just speaking with she has a question about her medications.

## 2021-01-10 NOTE — Progress Notes (Addendum)
[facility] Transitions of Care Clinic  PCP: Grier Mitts Primary Cardiologist:Nahser, Arnette Norris  HPI:  Beverly Martin is a 65 y.o.  female  with a PMH significant for HFrEF, CAD s/p CABG, HTN, T2DM, HLD, CKD    65 years old started having "heart trouble" says she wasn't feeling well one day and her doctor sent her to the hosptial.    Diagnosed with diabetes 2005, and had 3 vessel cardiac bypass surgery in 2006 at [facility] possibly with Dr. Nils Pyle.  Said she didn't have good follow up afterwards.  She did continue her aspirin.    After this never followed up with Cardiology or CT surgery.  Managed primarily by PCP and a diabetes specialist.   Kidney function was okay until 2019 when she was admitted for urosepsis and klebsiella bacteremia it did improve before her discharge 2.0->1.1   Later admitted for volume overload, severe HTN on 05/2020 and cr at that time 2.54.  Given lasix had an echo which showed EF 45-50% with G2DD  Was doing okay seeing her pcp and then on 01/03/21 saw her pcp found to be volume overloaded and sent to the ED where she was admitted for acute on chronic systolic CHF.  This in the setting of chronic poorly controlled HTN and she ran out of her medications several times including just before admission.  She was diuresed with IV lasix, switched to PO torsemide then discharged.    She denies any chest pain episodes.  Since discharge she has been doing her ADL's cooking and cleaning.  Walking at her work, works in the [employer] at an [employer].  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  No shortness of breath at rest.  Does have orthopnea uses 3 pillows to prop herself up.  Says she has been weighing herself since leaving the hospital and that it's 139 every day.  Weight up 1kg since discharge by hospital/our scales.     ROS: All systems negative except as listed in HPI, PMH and Problem  List.  SH:  Social History   Socioeconomic History  . Marital status: Married    Spouse name: Not on file  . Number of children: Not on file  . Years of education: Not on file  . Highest education level: Not on file  Occupational History  . Not on file  Tobacco Use  . Smoking status: Never Smoker  . Smokeless tobacco: Never Used  Vaping Use  . Vaping Use: Never used  Substance and Sexual Activity  . Alcohol use: No  . Drug use: No  . Sexual activity: Not Currently  Other Topics Concern  . Not on file  Social History Narrative  . Not on file   Social Determinants of Health   Financial Resource Strain: Low Risk   . Difficulty of Paying Living Expenses: Not hard at all  Food Insecurity: No Food Insecurity  . Worried About Charity fundraiser in the Last Year: Never true  . Ran Out of Food in the Last Year: Never true  Transportation Needs: No Transportation Needs  . Lack of Transportation (Medical): No  . Lack of Transportation (Non-Medical): No  Physical Activity: Insufficiently Active  . Days of Exercise per Week: 2 days  . Minutes of Exercise per Session: 20 min  Stress: Not on file  Social Connections: Not on file  Intimate Partner Violence: Not on  file    FH:  Family History  Problem Relation Age of Onset  . Diabetes Mellitus II Mother   . Esophageal cancer Father     Past Medical History:  Diagnosis Date  . CAD (coronary artery disease)   . Diabetes mellitus without complication ([place])   . Essential hypertension   . Hyperlipidemia     Current Outpatient Medications  Medication Sig Dispense Refill  . aspirin EC 81 MG EC tablet Take 1 tablet (81 mg total) by mouth daily. Swallow whole. 360 tablet 0  . atorvastatin (LIPITOR) 40 MG tablet TAKE 1 TABLET BY MOUTH EVERY DAY (Patient taking differently: Take 40 mg by mouth daily.) 30 tablet 2  . blood glucose meter kit and supplies KIT Dispense based on patient and insurance preference. Use up to four times  daily as directed. (FOR ICD-9 250.00, 250.01). 1 each 0  . Blood Pressure KIT Use as directed for checking BP daily. 1 kit 0  . calcitRIOL (ROCALTROL) 0.25 MCG capsule TAKE 1 CAPSULE BY MOUTH EVERY DAY (Patient taking differently: Take 0.25 mcg by mouth daily.) 60 capsule 0  . carvedilol (COREG) 6.25 MG tablet Take 1 tablet (6.25 mg total) by mouth 2 (two) times daily with a meal. 60 tablet 0  . [facility] Lancets Micro Thin 33G MISC Use as directed for testing blood sugar twice daily, more frequently for hyper or hypoglycemia. 100 each 3  . glimepiride (AMARYL) 1 MG tablet Take 1 mg by mouth daily with breakfast.    . isosorbide-hydrALAZINE (BIDIL) 20-37.5 MG tablet Take 2 tablets by mouth 3 (three) times daily. 180 tablet 0  . patiromer (VELTASSA) 8.4 g packet Take 1 packet (8.4 g total) by mouth daily for 3 days. 3 packet 0  . Torsemide 40 MG TABS Take 80 mg by mouth 2 (two) times daily for 2 days, THEN 80 mg daily. 62 tablet 3   No current facility-administered medications for this encounter.    Vitals:   01/11/21 0920  BP: (!) 152/86  Pulse: 70  SpO2: 90%  Weight: 64.5 kg    PHYSICAL EXAM: Cardiac: JVD to jaw, normal rate and rhythm, clear s1 and s2, no murmurs, rubs or gallops, bilateral 2+ pitting edema, upper and lower extremities warm and well perfused Pulmonary: rales in all fields worse in lower fields, no increased work of breathing, not in distress Abdominal: non distended abdomen, soft and nontender Psych: Alert, conversant, in good spirits   ASSESSMENT & PLAN: Acute on Chronic Combined Systolic and Diastolic CHF -Likely mixed ischemic and NICM, poorly controlled HTN, poor medication adherence -EF down from 45-50% in 2019 to now 20-25% -NYHA Class III symptoms, markedly overloaded but not as symptomatic as I would expect  -will need eventual L/RHC but renal function very poor and is prohibitive -Currently on Bidil 1tab, carvedilol 6.21m and torsemide 463mdaily (but only  taking 2010m-increase bidil to 2 tabs TID, increase torsemide to 31m14mD x 2 days then increase daily dose to 31mg26mly until she comes back to see me, I'm not certain she will respond to this therapy but she is doing well enough currently we can attempt to manage her outpatient  T2DM: -was very poorly controlled until renal function worsened dramatically -recommend stopping glimepiride with her renal disease -not a candidate for SGLT2i with renal fx and hx of urosepsis  HTN: -remains poorly controlled -increase bidil to 2 tabs TID  CKD4: -likely a combination of hypertensive and diabetic nephropathy  -clear  evidence of diabetic proteinuria, renal function too poor to start ARB therapy -repeat bmp today -needs repeat at follow up -recommend PCP refer her to nephrology   Follow up with me next week

## 2021-01-11 ENCOUNTER — Ambulatory Visit (HOSPITAL_COMMUNITY)
Admit: 2021-01-11 | Discharge: 2021-01-11 | Disposition: A | Discharge status: Home / Self Care | Payer: BC Managed Care – PPO | Attending: Internal Medicine | Admitting: Internal Medicine

## 2021-01-11 ENCOUNTER — Other Ambulatory Visit: Payer: Self-pay

## 2021-01-11 ENCOUNTER — Encounter (HOSPITAL_COMMUNITY): Payer: Self-pay

## 2021-01-11 ENCOUNTER — Telehealth (HOSPITAL_COMMUNITY): Payer: Self-pay | Admitting: Cardiology

## 2021-01-11 VITALS — BP 152/86 | HR 70 | Wt 142.2 lb

## 2021-01-11 DIAGNOSIS — Z7982 Long term (current) use of aspirin: Secondary | ICD-10-CM | POA: Insufficient documentation

## 2021-01-11 DIAGNOSIS — I130 Hypertensive heart and chronic kidney disease with heart failure and stage 1 through stage 4 chronic kidney disease, or unspecified chronic kidney disease: Secondary | ICD-10-CM | POA: Insufficient documentation

## 2021-01-11 DIAGNOSIS — I5023 Acute on chronic systolic (congestive) heart failure: Secondary | ICD-10-CM

## 2021-01-11 DIAGNOSIS — Z951 Presence of aortocoronary bypass graft: Secondary | ICD-10-CM | POA: Diagnosis not present

## 2021-01-11 DIAGNOSIS — Z7984 Long term (current) use of oral hypoglycemic drugs: Secondary | ICD-10-CM | POA: Diagnosis not present

## 2021-01-11 DIAGNOSIS — E1169 Type 2 diabetes mellitus with other specified complication: Secondary | ICD-10-CM | POA: Diagnosis not present

## 2021-01-11 DIAGNOSIS — E1165 Type 2 diabetes mellitus with hyperglycemia: Secondary | ICD-10-CM | POA: Diagnosis not present

## 2021-01-11 DIAGNOSIS — E785 Hyperlipidemia, unspecified: Secondary | ICD-10-CM

## 2021-01-11 DIAGNOSIS — I5043 Acute on chronic combined systolic (congestive) and diastolic (congestive) heart failure: Secondary | ICD-10-CM | POA: Insufficient documentation

## 2021-01-11 DIAGNOSIS — N184 Chronic kidney disease, stage 4 (severe): Secondary | ICD-10-CM | POA: Diagnosis not present

## 2021-01-11 DIAGNOSIS — I2510 Atherosclerotic heart disease of native coronary artery without angina pectoris: Secondary | ICD-10-CM | POA: Insufficient documentation

## 2021-01-11 DIAGNOSIS — E1122 Type 2 diabetes mellitus with diabetic chronic kidney disease: Secondary | ICD-10-CM | POA: Diagnosis not present

## 2021-01-11 DIAGNOSIS — Z79899 Other long term (current) drug therapy: Secondary | ICD-10-CM | POA: Insufficient documentation

## 2021-01-11 LAB — BASIC METABOLIC PANEL
Anion gap: 5 (ref 5–15)
BUN: 61 mg/dL — ABNORMAL HIGH (ref 8–23)
CO2: 35 mmol/L — ABNORMAL HIGH (ref 22–32)
Calcium: 9.3 mg/dL (ref 8.9–10.3)
Chloride: 103 mmol/L (ref 98–111)
Creatinine, Ser: 3.13 mg/dL — ABNORMAL HIGH (ref 0.44–1.00)
GFR, Estimated: 16 mL/min — ABNORMAL LOW (ref >60)
Glucose, Bld: 73 mg/dL (ref 70–99)
Potassium: 5.8 mmol/L — ABNORMAL HIGH (ref 3.5–5.1)
Sodium: 143 mmol/L (ref 135–145)

## 2021-01-11 MED ORDER — ISOSORB DINITRATE-HYDRALAZINE 20-37.5 MG PO TABS: 2.0000 | ORAL_TABLET | Freq: Three times a day (TID) | ORAL | 0 refills | Status: DC

## 2021-01-11 MED ORDER — TORSEMIDE 40 MG PO TABS: ORAL_TABLET | ORAL | 3 refills | Status: DC

## 2021-01-11 MED ORDER — VELTASSA 8.4 G PO PACK: 8.4000 g | PACK | Freq: Every day | ORAL | 0 refills | Status: AC

## 2021-01-11 NOTE — Telephone Encounter (Signed)
Abnormal labs received K 5.8 Per Dr Di Kindle 8.4g daily for 3 days  Repeat labs at follow up next week    [facility] for patient [phone]

## 2021-01-11 NOTE — Addendum Note (Signed)
Encounter addended by: Louann Liv, LCSW on: 01/11/2021 2:43 PM  Actions taken: Order list changed, Diagnosis association updated, Pharmacy for encounter modified

## 2021-01-11 NOTE — Patient Instructions (Addendum)
INCREASE Torsemide to 80 mg twice a day for 2 days, then 80 mg daily thereafter INCREASE Bidil to two tab twice a day  Labs today We will only contact you if something comes back abnormal or we need to make some changes. Otherwise no news is good news!  Your physician recommends that you schedule a follow-up appointment in: 1 week with Dr Shan Levans

## 2021-01-11 NOTE — Progress Notes (Addendum)
[facility] Transitions of Care Clinic Heart Failure Pharmacist Encounter  HPI:  65 yo F with PMH of CHF, CAD s/p CABG, HTN, CKD IV, T2DM, and HLD. She presented to the ED on 01/03/21 with increasing shortness of breath and LE edema. She reportedly stopped taking furosemide and carvedilol about 2 weeks prior to admission due to not being able to get it refilled by PCP or cardiology office. An ECHO was done on 01/03/21 and LVEF was reduced to 20-25% (was 45-50% in Aug 2021). She was then discharged on 01/08/21.  Today, Beverly Martin presents to the [facility] for follow up. She reports having continued shortness of breath, LE edema, and weight gain since discharge. After further interviewing, it was determined that she was only taking torsemide 20 mg daily instead of 40 mg daily (1 tab instead of 2 tabs). She has torsemide 20 mg tabs dispensed through [facility]. She denies having lightheadedness or dizziness. She has been checking her weights daily at home but her BP is high today in clinic 152/86. She has been taking her medications but has been taking her torsemide incorrectly.  HF Medications: Torsemide 40 mg daily Carvedilol 6.25 mg BID BiDil 20/37.5 mg 1 tab TID  Has the patient been experiencing any side effects to the medications prescribed?  no  Does the patient have any problems obtaining medications due to transportation or finances?   no  Understanding of regimen: fair Understanding of indications: fair Potential of compliance: fair Patient understands to avoid NSAIDs. Patient understands to avoid decongestants.   Pertinent Lab Values: . Serum creatinine 3.13, BUN 61, Potassium 5.8, Sodium 143  Vital Signs: . Weight: 142 lbs (discharge weight: 139 lbs) . Blood pressure: 152/86 mmHg  . Heart rate: 70 bpm  . ReDS reading: 46%  Medication Assistance / Insurance Benefits Check: Does the patient have prescription insurance?  Yes Type of insurance  plan: commercial insurance  Outpatient Pharmacy:  [facility] pharmacy used to supply discharge medications? yes  If TOC pharmacy was used, were the refills transferred out to current pharmacy yet? no  Is the patient willing to transition their outpatient pharmacy to utilize a [facility] outpatient pharmacy with or without mail order?   Pending compliance concerns outlined below - may need pill packing services ([facility]) vs paramedicine (could switch to [facility] or [facility] pharmacy)  Assessment: 1) Chronic systolic CHF (EF 0000000), due to ICM and medication noncompliance. NYHA class III symptoms. Volume overloaded on exam today.  - Increase torsemide to 80 mg BID x 2 days, then start 80 mg daily - Continue carvedilol 6.25 mg BID - Increase BiDil 20/37.5 mg to 2 tabs TID - No SGLT2i with history of urosepsis and advanced CKD - No ACE/ARB/ARNI or spironolactone with advanced CKD  - Potassium elevated at 5.8. No hemolysis noted. Start Veltassa 8.4g packet daily x 3 days. Recheck BMET at next visit.  Plan: 1) Medication changes: - Increase torsemide to 80 mg BID x 2 days, then start 80 mg daily - Increase BiDil 20/37.5 mg to 2 tabs TID - Add Veltassa 8.4g daily x 3 days   2) Patient Assistance: - BiDil copay card registered to patient to lower copay from $100 per month to $25 per month  - Rx BIN: [id number]  - Rx PCN: [facility]  - ID: [id number]  - Rx Group: [id number] - Provided a pill calendar to the patient so she can follow how  many tablets of each medicine she should take (included the 2 days worth of extra torsemide) each day until next visit. Will need to further assess understanding and compliance to medications. May need additional support (temporary paramedicine or pill packing services) but will hold off until next visit to assess compliance after today's visit. - Thorough HF education was done by PharmD during today's visit regarding medications, daily  weights, and follow up with the clinic if she is symptomatically not improving with increased torsemide dose - She brought her medications with her to clinic and I had her take the additional 3 tabs of torsemide in clinic for complete AM dose - Patient has been educated on basic disease state pathophysiology and goals of therapy - Time spent >60 mins  3) Follow up: - Next appointment with HF TOC clinic in 1 week  Kerby Nora, PharmD, BCPS Heart Failure Transitions of Care Clinic Pharmacist [phone]

## 2021-01-11 NOTE — Telephone Encounter (Signed)
Pt aware and voiced understanding 

## 2021-01-11 NOTE — Progress Notes (Signed)
Heart and Vascular Care Navigation  01/11/2021  Beverly Martin 04-20-1956 [id number]  Reason for Referral: CSW met with patient in the HF [facility] clinic.    Engaged with patient face to face for initial visit for Heart and Vascular Care Coordination.                                                                                                   Assessment:  Patient is married and lives in a town house with her husband. Patient states she is still working and reports she has insurance through her employer. Patient denies any concerns with food, housing or finances. She did share concerns regarding a past medical bill from a hospitalization last August that apparently went to collections. Patient shared that she thinks she did not provide her updated insurance and therefore insurance was not billed.                                HRT/VAS Care Coordination     Patients Home Cardiology Office --  HF [facility]   Outpatient Care Team Social Worker   Social Worker Name: Raquel Sarna, [place] [phone]   Living arrangements for the past 2 months Apartment  TownHouse   Lives with: Spouse   Patient Current Insurance Coverage Traditional Medicare; [employer]  Medicare Part A only/ [place]   Patient Has Concern With Paying Medical Bills No   Does Patient Have Prescription Coverage? Yes   Home Assistive Devices/Equipment Eyeglasses   DME Agency NA       Social History:                                                                             SDOH Screenings   Alcohol Screen: Low Risk   . Last Alcohol Screening Score (AUDIT): 0  Depression (PHQ2-9): Low Risk   . PHQ-2 Score: 1  Financial Resource Strain: Low Risk   . Difficulty of Paying Living Expenses: Not hard at all  Food Insecurity: No Food Insecurity  . Worried About Charity fundraiser in the Last Year: Never true  . Ran Out of Food in the Last Year: Never true  Housing: Low Risk   . Last Housing Risk Score: 0   Physical Activity: Insufficiently Active  . Days of Exercise per Week: 2 days  . Minutes of Exercise per Session: 20 min  Social Connections: Not on file  Stress: Not on file  Tobacco Use: Low Risk   . Smoking Tobacco Use: Never Smoker  . Smokeless Tobacco Use: Never Used  Transportation Needs: No Transportation Needs  . Lack of Transportation (Medical): No  . Lack of Transportation (Non-Medical): No    SDOH Interventions: Financial Resources:  Patient denies fianncial needs alhtough currently paying a past medical bill that went to collections.  Food Insecurity:   n/a  Housing Insecurity:   n/a  Transportation:        Follow-up plan:  CSW explained to patient that once medical bills go to collections there is little that can be done to submit to insurance at that time. CSW did obtain patient's current insurance cards and had entered into medical record to avoid in the future. Patient denies any other concerns at this time. Raquel Sarna, [place], [place]

## 2021-01-14 ENCOUNTER — Telehealth (HOSPITAL_COMMUNITY): Payer: Self-pay | Admitting: Internal Medicine

## 2021-01-14 NOTE — Telephone Encounter (Addendum)
Our staff called patient no answer left message on 4/1.  I called later that afternoon also and no answer.  Called today no answer again.  Her pharmacy confirmed she did pick up her veltassa on 4/1.

## 2021-01-14 NOTE — Addendum Note (Signed)
Encounter addended by: Katherine Roan, MD on: 01/14/2021 9:02 AM  Actions taken: Clinical Note Signed

## 2021-01-16 ENCOUNTER — Ambulatory Visit: Payer: BC Managed Care – PPO | Admitting: Family Medicine

## 2021-01-16 ENCOUNTER — Encounter: Payer: Self-pay | Admitting: Family Medicine

## 2021-01-16 ENCOUNTER — Inpatient Hospital Stay: Payer: BC Managed Care – PPO | Admitting: Family Medicine

## 2021-01-16 ENCOUNTER — Other Ambulatory Visit: Payer: Self-pay

## 2021-01-16 VITALS — BP 150/88 | HR 88 | Temp 98.2°F | Wt 142.2 lb

## 2021-01-16 DIAGNOSIS — Z09 Encounter for follow-up examination after completed treatment for conditions other than malignant neoplasm: Secondary | ICD-10-CM

## 2021-01-16 DIAGNOSIS — E1169 Type 2 diabetes mellitus with other specified complication: Secondary | ICD-10-CM

## 2021-01-16 DIAGNOSIS — I10 Essential (primary) hypertension: Secondary | ICD-10-CM | POA: Diagnosis not present

## 2021-01-16 DIAGNOSIS — E785 Hyperlipidemia, unspecified: Secondary | ICD-10-CM

## 2021-01-16 DIAGNOSIS — I5022 Chronic systolic (congestive) heart failure: Secondary | ICD-10-CM | POA: Diagnosis not present

## 2021-01-16 DIAGNOSIS — N184 Chronic kidney disease, stage 4 (severe): Secondary | ICD-10-CM | POA: Diagnosis not present

## 2021-01-16 NOTE — Patient Instructions (Addendum)
Stop taking glimeperide for diabetes due to your kidney function. A referral was placed for you to see the Nephrologist (kidney doctor).  Their office will call to you to schedule this appointment. Continue limiting your fluid intake to 1-1.5 L per day.  This is about three of the 16.9 oz bottles of water. Continue checking your weight each morning and writing it down.  Heart Failure Eating Plan Heart failure, also called congestive heart failure, occurs when your heart does not pump blood well enough to meet your body's needs for oxygen-rich blood. Heart failure is a long-term (chronic) condition. Living with heart failure can be challenging. Following your health care provider's instructions about a healthy lifestyle and working with a dietitian to choose the right foods may help to improve your symptoms. An eating plan for someone with heart failure will include changes that limit the intake of salt (sodium) and unhealthy fat. What are tips for following this plan? Reading food labels  Check food labels for the amount of sodium per serving. Choose foods that have less than 140 mg (milligrams) of sodium in each serving.  Check food labels for the number of calories per serving. This is important if you need to limit your daily calorie intake to lose weight.  Check food labels for the serving size. If you eat more than one serving, you will be eating more sodium and calories than what is listed on the label.  Look for foods that are labeled as "sodium-free," "very low sodium," or "low sodium." ? Foods labeled as "reduced sodium" or "lightly salted" may still have more sodium than what is recommended for you. Cooking  Avoid adding salt when cooking. Ask your health care provider or dietitian before using salt substitutes.  Season food with salt-free seasonings, spices, or herbs. Check the label of seasoning mixes to make sure they do not contain salt.  Cook with heart-healthy oils, such as  olive, canola, soybean, or sunflower oil.  Do not fry foods. Cook foods using low-fat methods, such as baking, boiling, grilling, and broiling.  Limit unhealthy fats when cooking by: ? Removing the skin from poultry, such as chicken. ? Removing all visible fats from meats. ? Skimming the fat off from stews, soups, and gravies before serving them. Meal planning  Limit your intake of: ? Processed, canned, or prepackaged foods. ? Foods that are high in trans fat, such as fried foods. ? Sweets, desserts, sugary drinks, and other foods with added sugar. ? Full-fat dairy products, such as whole milk.  Eat a balanced diet. This may include: ? 4-5 servings of fruit each day and 4-5 servings of vegetables each day. At each meal, try to fill one-half of your plate with fruits and vegetables. ? Up to 6-8 servings of whole grains each day. ? Up to 2 servings of lean meat, poultry, or fish each day. One serving of meat is equal to 3 oz (85 g). This is about the same size as a deck of cards. ? 2 servings of low-fat dairy each day. ? Heart-healthy fats. Healthy fats called omega-3 fatty acids are found in foods such as flaxseed and cold-water fish like sardines, salmon, and mackerel.  Aim to eat 25-35 g (grams) of fiber a day. Foods that are high in fiber include apples, broccoli, carrots, beans, peas, and whole grains.  Do not add salt or condiments that contain salt (such as soy sauce) to foods before eating.  When eating at a restaurant, ask that your  food be prepared with less salt or no salt, if possible.  Try to eat 2 or more vegetarian meals each week.  Eat more home-cooked food and eat less restaurant, buffet, and fast food.   General information  Do not eat more than 2,300 mg of sodium a day. The amount of sodium that is recommended for you may be lower, depending on your condition.  Maintain a healthy body weight as directed. Ask your health care provider what a healthy weight is for  you. ? Check your weight every day. ? Work with your health care provider and dietitian to make a plan that is right for you to lose weight or maintain your current weight.  Limit how much fluid you drink. Ask your health care provider or dietitian how much fluid you can have each day.  Limit or avoid alcohol as told by your health care provider or dietitian. Recommended foods Fruits All fresh, frozen, and canned fruits. Dried fruits, such as raisins, prunes, and cranberries. Vegetables All fresh vegetables. Vegetables that are frozen without sauce or added salt. Low-sodium or sodium-free canned vegetables. Grains Bread with less than 80 mg of sodium per slice. Whole-wheat pasta, quinoa, and brown rice. Oats and oatmeal. Barley. [place]. Grits and cream of wheat. Whole-grain and whole-wheat cold cereal. Meats and other protein foods Lean cuts of meat. Skinless chicken and [place]. Fish with high omega-3 fatty acids, such as salmon, sardines, and other cold-water fishes. Eggs. Dried beans, peas, and edamame. Unsalted nuts and nut butters. Dairy Low-fat or nonfat (skim) milk and dried milk. Rice milk, soy milk, and almond milk. Low-fat or nonfat yogurt. Small amounts of reduced-sodium block cheese. Low-sodium cottage cheese. Fats and oils Olive, canola, soybean, flaxseed, avocado, or sunflower oil. Sweets and desserts Applesauce. Granola bars. Sugar-free pudding and gelatin. Frozen fruit bars. Seasoning and other foods Fresh and dried herbs. Lemon or lime juice. Vinegar. Low-sodium ketchup. Salt-free marinades, salad dressings, sauces, and seasonings. The items listed above may not be a complete list of foods and beverages you can eat. Contact a dietitian for more information. Foods to avoid Fruits Fruits that are dried with sodium-containing preservatives. Vegetables Canned vegetables. Frozen vegetables with sauce or seasonings. Creamed vegetables. [place] fries. Onion rings. Pickled  vegetables and sauerkraut. Grains Bread with more than 80 mg of sodium per slice. Hot or cold cereal with more than 140 mg sodium per serving. Salted pretzels and crackers. Prepackaged breadcrumbs. Bagels, croissants, and biscuits. Meats and other protein foods Ribs and chicken wings. Bacon, ham, pepperoni, bologna, salami, and packaged luncheon meats. Hot dogs, bratwurst, and sausage. Canned meat. Smoked meat and fish. Salted nuts and seeds. Dairy Whole milk, half-and-half, and cream. Buttermilk. Processed cheese, cheese spreads, and cheese curds. Regular cottage cheese. Feta cheese. Shredded cheese. String cheese. Fats and oils Butter, lard, shortening, ghee, and bacon fat. Canned and packaged gravies. Seasoning and other foods Onion salt, garlic salt, table salt, and sea salt. Marinades. Regular salad dressings. Relishes, pickles, and olives. Meat flavorings and tenderizers, and bouillon cubes. Horseradish, ketchup, and mustard. Worcestershire sauce. Teriyaki sauce, soy sauce (including reduced sodium). Hot sauce and Tabasco sauce. Steak sauce, fish sauce, oyster sauce, and cocktail sauce. Taco seasonings. Barbecue sauce. Tartar sauce. The items listed above may not be a complete list of foods and beverages you should avoid. Contact a dietitian for more information. Summary  A heart failure eating plan includes changes that limit your intake of sodium and unhealthy fat, and it may help you  lose weight or maintain a healthy weight. Your health care provider may also recommend limiting how much fluid you drink.  Most people with heart failure should eat no more than 2,300 mg of salt (sodium) a day. The amount of sodium that is recommended for you may be lower, depending on your condition.  Contact your health care provider or dietitian before making any major changes to your diet. This information is not intended to replace advice given to you by your health care provider. Make sure you discuss  any questions you have with your health care provider. Document Revised: 05/14/2020 Document Reviewed: 05/14/2020 Elsevier Patient Education  2021 [place].  Heart Failure Exacerbation  Heart failure is a condition in which the heart has trouble pumping blood. This may mean that the heart cannot pump enough blood out to the body or that the heart does not fill up with enough blood. When this happens, parts of the body do not get the blood and oxygen they need to function properly. This can cause symptoms such as breathing problems, tiredness (fatigue), swelling, and confusion. Heart failure exacerbation refers to heart failure symptoms that get worse. The symptoms may get worse suddenly or develop slowly over time. Heart failure exacerbation is a serious medical problem that should be treated right away. What are the causes? A heart failure exacerbation can be triggered by:  Not taking your heart failure medicines correctly.  Infections.  Eating an unhealthy diet or a diet that is high in salt (sodium).  Drinking too much fluid.  Drinking alcohol.  Using drugs, such as cocaine or methamphetamine.  Not exercising. Other causes include:  Other heart conditions such as an irregular heart rhythm (arrhythmia).  Worsening heart valve function.  Low blood counts (anemia).  Other medical problems, such as kidney failure, thyroid problems, or diabetes mellitus. Sometimes the cause of the exacerbation is not known. What are the signs or symptoms? When heart failure symptoms suddenly or slowly get worse, this may be a sign of heart failure exacerbation. Symptoms of heart failure include:  Shortness of breath during activity or exercise.  A cough that does not go away.  Swelling of the legs, ankles, feet, or abdomen.  Losing or gaining weight for no reason.  Trouble breathing when lying down.  Increased heart rate or irregular heartbeat.  Fatigue.  Feeling light-headed,  dizzy, or close to fainting.  Nausea or lack of appetite. How is this diagnosed? This condition is diagnosed based on:  Your symptoms and medical history.  A physical exam. You may also have tests, including:  Electrocardiogram (ECG). This test measures the electrical activity of your heart.  Echocardiogram. This test uses sound waves to take a picture of your heart to see how well it works.  Blood tests.  Imaging tests, such as: ? Chest X-ray. ? MRI. ? Ultrasound.  Stress test. This test examines how well your heart functions while you exercise on a treadmill or exercise bike. If you cannot exercise, medicines may be used to increase your heartbeat in place of exercise.  Cardiac catheterization. During this test, a thin, flexible tube (catheter) is inserted into a blood vessel and threaded up to your heart. This test allows your health care provider to check the arteries that lead to your heart (coronary arteries).  Right heart catheterization. During this test, the pressure in your heart is measured. How is this treated? This condition may be treated by:  Adjusting your heart medicines.  Maintaining a healthy  lifestyle. This includes: ? Eating a heart-healthy diet that is low in sodium. ? Not using products that contain nicotine or tobacco. ? Regular exercise. ? Monitoring your fluid intake. ? Monitoring your weight and reporting changes to your health care provider. ? Not using alcohol or drugs.  Treating sleep apnea, if you have this condition.  Surgery. This may include: ? Placing a pacemaker to improve heart function (cardiac resynchronization therapy). ? Implanting a device that can correct heart rhythm problems (implantable cardioverter defibrillator). ? Implanting a pulmonary arterial pressure monitor to monitor your fluid balance. ? Connecting a device to your heart to help it pump blood (ventricular assist device). ? Heart transplant. Follow these  instructions at home: Medicines  Take over-the-counter and prescription medicines only as told by your health care provider.  Do not stop taking your medicines or change the amount you take. If you are having problems or side effects from your medicines, talk to your health care provider.  If you are having difficulty paying for your medicines, contact a social worker or your clinic. There are many programs to assist with medicine costs.  Talk to your health care provider before starting any new medicines or supplements.  Make sure your health care provider and pharmacist have a list of all the medicines you are taking. Eating and drinking  Avoid drinking alcohol.  Eat a heart-healthy diet as told by your health care provider. This includes: ? Plenty of fruits and vegetables. ? Lean proteins. ? Low-fat dairy. ? Whole grains. ? Foods that are low in sodium.   Activity  Exercise regularly as told by your health care provider. Balance exercise with rest.  Ask your health care provider what activities are safe for you. This includes sexual activity, exercise, and daily tasks at home or work.   Lifestyle  Do not use any products that contain nicotine or tobacco. These products include cigarettes, chewing tobacco, and vaping devices, such as e-cigarettes. If you need help quitting, ask your health care provider.  Maintain a healthy weight. Ask your health care provider what weight is healthy for you.  Consider joining a patient support group. This can help with emotional problems you may have, such as stress and anxiety.  Do not use drugs. General instructions  Stay up to date with vaccines. Talk to your health care provider about flu and pneumonia vaccines.  Keep a list of medicines that you are taking. This may help in emergency situations.  Keep all follow-up visits. This is important. Contact a health care provider if:  You have questions about your medicines or you miss a  dose.  You feel anxious, depressed, or stressed.  You develop swelling in your feet, ankles, legs, or abdomen.  You develop a cough.  You have a fever.  You have trouble sleeping.  You gain 2-3 lb (1-1.4 kg) in 24 hours or 5 lb (2.3 kg) in a week. Get help right away if:  You have chest pain or pressure.  You have shortness of breath while resting.  You have severe fatigue.  You are confused.  You have severe dizziness.  You have a rapid or irregular heartbeat.  You have nausea or you vomit.  You have a cough that is worse at night or you cannot lie flat.  You have severe depression or sadness. These symptoms may represent a serious problem that is an emergency. Do not wait to see if the symptoms will go away. Get medical help right away.  Call your local emergency services (911 in the [place] Do not drive yourself to the hospital. Summary  When heart failure symptoms get worse, it is called heart failure exacerbation.  Common causes of this condition include taking medicines incorrectly, infections, and drinking alcohol.  This condition may be treated by adjusting medicines, maintaining a healthy lifestyle, or surgery.  Do not stop taking your medicines or change the amount you take. If you are having problems or side effects from your medicines, talk to your health care provider. This information is not intended to replace advice given to you by your health care provider. Make sure you discuss any questions you have with your health care provider. Document Revised: 04/21/2020 Document Reviewed: 04/21/2020 Elsevier Patient Education  2021 [place] for Chronic Kidney Disease Chronic kidney disease (CKD) occurs when the kidneys are permanently damaged over a long period of time. When your kidneys are not working well, they cannot remove waste, fluids, and other substances from your blood as well as they did before. The substances can build up, which can  worsen kidney damage and affect how your body functions. Certain foods lead to a buildup of these substances. By changing your diet, you can help prevent more kidney damage and delay or prevent the need for dialysis. What are tips for following this plan? Reading food labels  Check the amount of salt (sodium) in foods. Choose foods that have less than 300 milligrams (mg) per serving.  Check the ingredient list for phosphorus or potassium-based additives or preservatives.  Check the amount of saturated fat and trans fat. Limit or avoid these fats as told by your dietitian. Shopping  Avoid buying foods that are: ? Processed or prepackaged. ? Calcium-enriched or that have calcium added to them (are fortified).  Do not buy foods that have salt or sodium listed among the first five ingredients.  Buy canned vegetables and beans that say "no salt added" or "low sodium" and rinse them before eating. Cooking  Soak vegetables, such as potatoes, before cooking to reduce potassium. To do this: 1. Peel and cut the vegetables into small pieces. 2. Soak the vegetables in warm water for at least 2 hours. For every 1 cup of vegetables, use 10 cups of water. 3. Drain and rinse the vegetables with warm water. 4. Boil the vegetables for at least 5 minutes. Meal planning  Limit the amount of protein you eat from plant and animal sources each day.  Do not add salt to food when cooking or before eating.  Eat meals and snacks at around the same time each day. General information  Talk with your health care provider about whether you should take a vitamin and mineral supplement.  Use standard measuring cups and spoons to measure servings of foods. Use a kitchen scale to measure portions of protein foods.  If told by your health care provider, avoid drinking too much fluid. Measure and count all liquids, including water, ice, soups, flavored gelatin, and frozen desserts such as ice pops or ice cream. If  you have diabetes:  If you have diabetes (diabetes mellitus) and CKD, it is important to keep your blood sugar (glucose) in the target range recommended by your health care provider. Follow your diabetes management plan. This may include: ? Checking your blood glucose regularly. ? Taking medicines by mouth, taking insulin, or taking both. ? Exercising for at least 30 minutes on 5 or more days each week, or as told by your  health care provider. ? Tracking how many servings of carbohydrates you eat at each meal.  You may be given specific guidelines on how much of certain foods and nutrients you may eat, depending on your stage of kidney disease and whether you have high blood pressure (hypertension). Follow your meal plan as told by your dietitian. What nutrients should I limit? Work with your health care provider and dietitian to develop a meal plan that is right for you. Foods you can eat and foods you should limit or avoid will depend on the stage of your kidney disease and any other health conditions you have. The items listed below are not a complete list. Talk with your dietitian about what dietary choices are best for you. Potassium Potassium affects how steadily your heart beats. If too much potassium builds up in your blood, the potassium can cause an irregular heartbeat or even a heart attack. You may need to limit or avoid foods that are high in potassium, such as:  Milk and soy milk.  Fruits, such as bananas, apricots, nectarines, melon, prunes, raisins, kiwi, and oranges.  Vegetables, such as potatoes, sweet potatoes, yams, tomatoes, leafy greens, beets, avocado, pumpkin, and winter squash.  White and lima beans.  Whole-wheat breads and pastas.  Beans and nuts. Phosphorus Phosphorus is a mineral found in your bones. A balance between calcium and phosphorus is needed to build and maintain healthy bones. Too much phosphorus pulls calcium from your bones. This can make your bones  weak and more likely to break. Too much phosphorus can also make your skin itch. You may need to limit or avoid foods that are high in phosphorus, such as:  Milk and dairy products.  Dried beans and peas.  Tofu, soy milk, and other soy-based meat replacements.  Dark-colored sodas.  Nuts and peanut butter.  Meat, poultry, and fish.  Bran cereals and oatmeal. Protein Protein helps you make and keep muscle. It also helps to repair your body's cells and tissues. One of the natural breakdown products of protein is a waste product called urea. When your kidneys are not working properly, they cannot remove wastes, such as urea. Reducing how much protein you eat can help prevent a buildup of urea in your blood. Depending on your stage of kidney disease, you may need to limit foods that are high in protein. Sources of animal protein include:  Meat (all types).  Fish and seafood.  Poultry.  Eggs.  Dairy. Other protein foods include:  Beans and legumes.  Nuts and nut butter.  Soy and tofu.   Sodium Sodium helps to maintain a healthy balance of fluids in your body. Too much sodium can increase your blood pressure and have a negative effect on your heart and lungs. Too much sodium can also cause your body to retain too much fluid, making your kidneys work harder. Most people should have less than 2,300 mg of sodium each day. If you have hypertension, you may need to limit your sodium to 1,500 mg each day. You may need to limit or avoid foods that are high in sodium, such as:  Salt seasonings.  Soy sauce.  Cured and processed meats.  Salted crackers and snack foods.  Fast food.  Canned soups and most canned foods.  Pickled foods.  Vegetable juice.  Boxed mixes or ready-to-eat boxed meals and side dishes.  Bottled dressings, sauces, and marinades. Talk with your dietitian about how much potassium, phosphorus, protein, and sodium you may have each  day. Summary  Chronic  kidney disease (CKD) can lead to a buildup of waste and extra substances in the body. Certain foods lead to a buildup of these substances. By changing your diet as told, you can help prevent more kidney damage and delay or prevent the need for dialysis.  Food intake changes are different for each person with CKD. Work with a dietitian to set up nutrient goals and a meal plan that is right for you.  If you have diabetes and CKD, it is important to keep your blood sugar in the target range recommended by your health care provider. This information is not intended to replace advice given to you by your health care provider. Make sure you discuss any questions you have with your health care provider. Document Revised: 01/23/2020 Document Reviewed: 01/23/2020 Elsevier Patient Education  2021 [employer].

## 2021-01-16 NOTE — Progress Notes (Signed)
Subjective:    Patient ID: Beverly Martin, female    DOB: 08/12/1956, 64 y.o.   MRN: [id number]  Chief Complaint  Patient presents with  . Follow-up  Patient accompanied by her husband.  HPI Patient is a 65 yo female with pmh sig for CAD s/p 3v CABG, DM II, CKD 4, HTN, HLD, sys CHF, seen for HFU.  Patient sent from [facility] to ED and admitted 3/24-3/29 for anasarca 2/2 acute CHF exacerbation.  Pt diuresed 3.856 mL with IV furosemide.  ECHO with worsening LV systolic function.  During hospitalization Lasix changed to torsemide.  BiDil added.  Pt had f/u with Cardiology.  Notes some improvement in edema.  Working on diet, weighing daily, and trying to restrict fluids, but unsure on amount.  Wt 139-140 lbs in the last wk.  Pt working in mail room at work, has more edema with sitting.  Between 1-3 pillow orthopnea and having intermittent SOB, relieved by breathing exercises.  Not checking fsbs.  Past Medical History:  Diagnosis Date  . CAD (coronary artery disease)   . Diabetes mellitus without complication ([place])   . Essential hypertension   . Hyperlipidemia     No Known Allergies  ROS General: Denies fever, chills, night sweats, changes in weight, changes in appetite HEENT: Denies headaches, ear pain, changes in vision, rhinorrhea, sore throat CV: Denies CP, palpitations, SOB, orthopnea  +SOB, LE edema Pulm: Denies SOB, cough, wheezing +SOB GI: Denies abdominal pain, nausea, vomiting, diarrhea, constipation GU: Denies dysuria, hematuria, frequency, vaginal discharge Msk: Denies muscle cramps, joint pains Neuro: Denies weakness, numbness, tingling Skin: Denies rashes, bruising Psych: Denies depression, anxiety, hallucinations     Objective:    Blood pressure (!) 150/88, pulse 88, temperature 98.2 F (36.8 C), temperature source Oral, weight 142 lb 3.2 oz (64.5 kg), SpO2 (!) 83 %.  Gen. Pleasant, well-nourished, in no distress, normal affect   HEENT: [employer]/AT, face symmetric, conjunctiva  clear, no scleral icterus, PERRLA, EOMI, nares patent without drainage Lungs: no accessory muscle use, CTAB, no wheezes or rales Cardiovascular: RRR, no m/r/g, LLE with 2+ pitting edema of lower leg/shin, 1+ at knee and trace of thigh.  RLE with 1+ pitting edema of lower leg, trace at thigh Musculoskeletal: No deformities, no cyanosis or clubbing, normal tone Neuro:  A&Ox3, CN II-XII intact, normal gait Skin:  Warm, no lesions/ rash.  Well healed surgical incision midline sternum.  B/l    Wt Readings from Last 3 Encounters:  01/16/21 142 lb 3.2 oz (64.5 kg)  01/11/21 142 lb 3.2 oz (64.5 kg)  01/08/21 139 lb 15.9 oz (63.5 kg)    Lab Results  Component Value Date   WBC 6.9 01/03/2021   HGB 11.7 (L) 01/03/2021   HCT 37.2 01/03/2021   PLT 178 01/03/2021   GLUCOSE 73 01/11/2021   CHOL 245 (H) 06/10/2020   TRIG 93 06/10/2020   HDL 69 06/10/2020   LDLCALC 157 (H) 06/10/2020   ALT 17 01/03/2021   AST 16 01/03/2021   NA 143 01/11/2021   K 5.8 (H) 01/11/2021   CL 103 01/11/2021   CREATININE 3.13 (H) 01/11/2021   BUN 61 (H) 01/11/2021   CO2 35 (H) 01/11/2021   TSH 2.053 06/10/2020   INR 0.92 10/20/2017   HGBA1C 6.4 12/06/2020   MICROALBUR 33.3 (H) 12/06/2020    Assessment/Plan:  Hospital discharge follow-up -Notes, labs, imaging from hospitalization reviewed -TCM phone call made and reviewed.  CKD (chronic kidney disease) stage 4, GFR 15-29  ml/min (HCC) -GFR 16 on 01/19/2021 -Discussed work restrictions, lifestyle modifications -Renally dose meds and avoid nephrotoxic medication -Given handout - Plan: Ambulatory referral to Nephrology  Chronic systolic heart failure ([place]) -Echo 01/03/21 with worsening LV function, EF 20-25%, LV global hypokinesis, RV preserved systolic function, elevated LA pressures, moderate tricuspid valve regurg, mild to moderate mitral valve regurg -Discussed fluid restrictions -Continue torsemide, BiDil, carvedilol per cardiology recommendations as  adjustments recently made. -Encouraged to keep follow-up appointment with cards tomorrow, 01/17/21  Type 2 diabetes mellitus with hyperlipidemia ([place]) -hgb A1C 6.4% on 12/06/20 -glimiperide d/c'd 2/2 renal function and risk of hypoglycemia -limited in options.  Consider insulin, however concerns about compliance. -discussed the importance of lifestyle modifications -pt advised to start checking fsbs regularly  Essential hypertension -uncontrolled -no med adjustments made given recent changes by cardiology -Continue BiDil, torsemide, carvedilol  F/u in 1 month, sooner if needed  Grier Mitts, MD

## 2021-01-17 ENCOUNTER — Ambulatory Visit (HOSPITAL_COMMUNITY)
Admission: RE | Admit: 2021-01-17 | Discharge: 2021-01-17 | Disposition: A | Discharge status: Home / Self Care | Payer: BC Managed Care – PPO | Source: Ambulatory Visit | Attending: Internal Medicine | Admitting: Internal Medicine

## 2021-01-17 ENCOUNTER — Encounter (HOSPITAL_COMMUNITY): Payer: Self-pay

## 2021-01-17 ENCOUNTER — Other Ambulatory Visit: Payer: Self-pay

## 2021-01-17 ENCOUNTER — Inpatient Hospital Stay (HOSPITAL_COMMUNITY)
Admission: AD | Admit: 2021-01-17 | Discharge: 2021-01-27 | DRG: 286 | Disposition: A | Discharge status: Home / Self Care | Payer: BC Managed Care – PPO | Source: Ambulatory Visit | Attending: Internal Medicine | Admitting: Internal Medicine

## 2021-01-17 VITALS — BP 130/62 | HR 81 | Wt 144.0 lb

## 2021-01-17 DIAGNOSIS — E1122 Type 2 diabetes mellitus with diabetic chronic kidney disease: Secondary | ICD-10-CM | POA: Diagnosis not present

## 2021-01-17 DIAGNOSIS — E785 Hyperlipidemia, unspecified: Secondary | ICD-10-CM | POA: Diagnosis present

## 2021-01-17 DIAGNOSIS — Z7982 Long term (current) use of aspirin: Secondary | ICD-10-CM | POA: Diagnosis not present

## 2021-01-17 DIAGNOSIS — E875 Hyperkalemia: Secondary | ICD-10-CM | POA: Insufficient documentation

## 2021-01-17 DIAGNOSIS — Z833 Family history of diabetes mellitus: Secondary | ICD-10-CM

## 2021-01-17 DIAGNOSIS — Z79899 Other long term (current) drug therapy: Secondary | ICD-10-CM

## 2021-01-17 DIAGNOSIS — I2510 Atherosclerotic heart disease of native coronary artery without angina pectoris: Secondary | ICD-10-CM | POA: Insufficient documentation

## 2021-01-17 DIAGNOSIS — I5082 Biventricular heart failure: Secondary | ICD-10-CM | POA: Diagnosis present

## 2021-01-17 DIAGNOSIS — I5043 Acute on chronic combined systolic (congestive) and diastolic (congestive) heart failure: Secondary | ICD-10-CM | POA: Diagnosis present

## 2021-01-17 DIAGNOSIS — I130 Hypertensive heart and chronic kidney disease with heart failure and stage 1 through stage 4 chronic kidney disease, or unspecified chronic kidney disease: Secondary | ICD-10-CM | POA: Diagnosis not present

## 2021-01-17 DIAGNOSIS — Z800 Family history of malignant neoplasm of digestive organs: Secondary | ICD-10-CM | POA: Diagnosis not present

## 2021-01-17 DIAGNOSIS — I428 Other cardiomyopathies: Secondary | ICD-10-CM | POA: Diagnosis present

## 2021-01-17 DIAGNOSIS — Z20822 Contact with and (suspected) exposure to covid-19: Secondary | ICD-10-CM | POA: Diagnosis not present

## 2021-01-17 DIAGNOSIS — Z951 Presence of aortocoronary bypass graft: Secondary | ICD-10-CM | POA: Insufficient documentation

## 2021-01-17 DIAGNOSIS — I255 Ischemic cardiomyopathy: Secondary | ICD-10-CM | POA: Diagnosis present

## 2021-01-17 DIAGNOSIS — N184 Chronic kidney disease, stage 4 (severe): Secondary | ICD-10-CM | POA: Diagnosis present

## 2021-01-17 DIAGNOSIS — E873 Alkalosis: Secondary | ICD-10-CM | POA: Diagnosis not present

## 2021-01-17 DIAGNOSIS — N179 Acute kidney failure, unspecified: Secondary | ICD-10-CM | POA: Diagnosis present

## 2021-01-17 DIAGNOSIS — Z9114 Patient's other noncompliance with medication regimen: Secondary | ICD-10-CM

## 2021-01-17 DIAGNOSIS — I5023 Acute on chronic systolic (congestive) heart failure: Secondary | ICD-10-CM | POA: Diagnosis not present

## 2021-01-17 DIAGNOSIS — I5021 Acute systolic (congestive) heart failure: Secondary | ICD-10-CM | POA: Diagnosis not present

## 2021-01-17 DIAGNOSIS — E1169 Type 2 diabetes mellitus with other specified complication: Secondary | ICD-10-CM

## 2021-01-17 LAB — CBC
HCT: 33.1 % — ABNORMAL LOW (ref 36.0–46.0)
Hemoglobin: 10.6 g/dL — ABNORMAL LOW (ref 12.0–15.0)
MCH: 31.8 pg (ref 26.0–34.0)
MCHC: 32 g/dL (ref 30.0–36.0)
MCV: 99.4 fL (ref 80.0–100.0)
Platelets: 156 10*3/uL (ref 150–400)
RBC: 3.33 MIL/uL — ABNORMAL LOW (ref 3.87–5.11)
RDW: 13.6 % (ref 11.5–15.5)
WBC: 6.7 10*3/uL (ref 4.0–10.5)
nRBC: 0 % (ref 0.0–0.2)

## 2021-01-17 LAB — BASIC METABOLIC PANEL
Anion gap: 14 (ref 5–15)
BUN: 77 mg/dL — ABNORMAL HIGH (ref 8–23)
CO2: 27 mmol/L (ref 22–32)
Calcium: 8.7 mg/dL — ABNORMAL LOW (ref 8.9–10.3)
Chloride: 99 mmol/L (ref 98–111)
Creatinine, Ser: 3.65 mg/dL — ABNORMAL HIGH (ref 0.44–1.00)
GFR, Estimated: 13 mL/min — ABNORMAL LOW (ref >60)
Glucose, Bld: 196 mg/dL — ABNORMAL HIGH (ref 70–99)
Potassium: 5.2 mmol/L — ABNORMAL HIGH (ref 3.5–5.1)
Sodium: 140 mmol/L (ref 135–145)

## 2021-01-17 LAB — GLUCOSE, CAPILLARY: Glucose-Capillary: 227 mg/dL — ABNORMAL HIGH (ref 70–99)

## 2021-01-17 LAB — BRAIN NATRIURETIC PEPTIDE: B Natriuretic Peptide: >4500 pg/mL — ABNORMAL HIGH (ref 0.0–100.0)

## 2021-01-17 MED ORDER — INSULIN ASPART 100 UNIT/ML ~~LOC~~ SOLN: 2.0000 [IU] | Freq: Three times a day (TID) | SUBCUTANEOUS | Status: DC

## 2021-01-17 MED ORDER — HEPARIN SODIUM (PORCINE) 5000 UNIT/ML IJ SOLN
5000.0000 [IU] | Freq: Three times a day (TID) | INTRAMUSCULAR | Status: DC
  Administered 2021-01-17 – 2021-01-18 (×2): 5000 [IU] via SUBCUTANEOUS
  Filled 2021-01-17 (×2): qty 1

## 2021-01-17 MED ORDER — ONDANSETRON HCL 4 MG/2ML IJ SOLN: 4.0000 mg | Freq: Four times a day (QID) | INTRAMUSCULAR | Status: DC | PRN

## 2021-01-17 MED ORDER — INSULIN ASPART 100 UNIT/ML ~~LOC~~ SOLN
0.0000 [IU] | Freq: Every day | SUBCUTANEOUS | Status: DC
  Administered 2021-01-17: 2 [IU] via SUBCUTANEOUS

## 2021-01-17 MED ORDER — ISOSORB DINITRATE-HYDRALAZINE 20-37.5 MG PO TABS
2.0000 | ORAL_TABLET | Freq: Three times a day (TID) | ORAL | Status: DC
  Administered 2021-01-17 – 2021-01-20 (×9): 2 via ORAL
  Filled 2021-01-17 (×9): qty 2

## 2021-01-17 MED ORDER — SODIUM CHLORIDE 0.9 % IV SOLN
250.0000 mL | INTRAVENOUS | Status: DC | PRN
  Administered 2021-01-19 – 2021-01-22 (×3): 250 mL via INTRAVENOUS

## 2021-01-17 MED ORDER — FUROSEMIDE 10 MG/ML IJ SOLN
120.0000 mg | Freq: Two times a day (BID) | INTRAVENOUS | Status: DC
  Administered 2021-01-17: 120 mg via INTRAVENOUS
  Filled 2021-01-17 (×2): qty 12

## 2021-01-17 MED ORDER — METOLAZONE 5 MG PO TABS
2.5000 mg | ORAL_TABLET | Freq: Every day | ORAL | Status: DC
  Administered 2021-01-17: 2.5 mg via ORAL
  Filled 2021-01-17: qty 1

## 2021-01-17 MED ORDER — SODIUM CHLORIDE 0.9% FLUSH: 3.0000 mL | INTRAVENOUS | Status: DC | PRN

## 2021-01-17 MED ORDER — INSULIN ASPART 100 UNIT/ML ~~LOC~~ SOLN
0.0000 [IU] | Freq: Three times a day (TID) | SUBCUTANEOUS | Status: DC
  Administered 2021-01-19: 2 [IU] via SUBCUTANEOUS
  Administered 2021-01-20: 5 [IU] via SUBCUTANEOUS
  Administered 2021-01-21 (×3): 1 [IU] via SUBCUTANEOUS
  Administered 2021-01-22: 2 [IU] via SUBCUTANEOUS
  Administered 2021-01-22: 1 [IU] via SUBCUTANEOUS
  Administered 2021-01-22 – 2021-01-23 (×2): 3 [IU] via SUBCUTANEOUS
  Administered 2021-01-24: 5 [IU] via SUBCUTANEOUS
  Administered 2021-01-24: 1 [IU] via SUBCUTANEOUS
  Administered 2021-01-24: 3 [IU] via SUBCUTANEOUS
  Administered 2021-01-25: 1 [IU] via SUBCUTANEOUS
  Administered 2021-01-25: 2 [IU] via SUBCUTANEOUS
  Administered 2021-01-25 – 2021-01-26 (×2): 3 [IU] via SUBCUTANEOUS
  Administered 2021-01-26 – 2021-01-27 (×3): 1 [IU] via SUBCUTANEOUS
  Administered 2021-01-27: 7 [IU] via SUBCUTANEOUS

## 2021-01-17 MED ORDER — ACETAMINOPHEN 325 MG PO TABS: 650.0000 mg | ORAL_TABLET | ORAL | Status: DC | PRN

## 2021-01-17 MED ORDER — SODIUM CHLORIDE 0.9% FLUSH
3.0000 mL | Freq: Two times a day (BID) | INTRAVENOUS | Status: DC
  Administered 2021-01-17 – 2021-01-27 (×11): 3 mL via INTRAVENOUS

## 2021-01-17 NOTE — H&P (Signed)
Advanced Heart Failure Team History and Physical Note   PCP:  Billie Ruddy, MD  PCP-Cardiology: Mertie Moores, MD     Reason for Admission: Acute on Chronic Combined Systolic and Diastolic CHF   HPI:   Beverly Martin is a 65 y.o.  female  with a PMH significant for HFrEF, CAD s/p CABG, HTN, T2DM, HLD, CKD IIIb   Diagnosed with diabetes 2005, and had 3 vessel cardiac bypass surgery in 2006 at [facility] possibly with Dr. Nils Pyle.  Said she didn't have good follow up afterwards.  She did continue her aspirin.    After this never followed up with Cardiology or CT surgery.  Managed primarily by PCP and a diabetes specialist.   Kidney function was okay until 2019 when she was admitted for urosepsis and klebsiella bacteremia it did improve before her discharge 2.0->1.1   Later admitted for volume overload, severe HTN on 05/2020 and cr at that time 2.54.  Given lasix had an echo which showed EF 45-50% with G2DD  Was doing okay seeing her pcp and then on 01/03/21 saw her pcp found to be volume overloaded and sent to the ED where she was admitted for acute on chronic systolic CHF. This was in the setting of chronic poorly controlled HTN and she ran out of her medications several times including just before admission.  She was diuresed with IV lasix, switched to PO torsemide then discharged but remained volume overloaded.    She was seen  a week ago [facility] and was clearly still volume overloaded after her recent admission but looked suprisingly good given her cardiac function and being severely volume overloaded.  We increased her diuretics substantially torsemide 80 bid x2 days then 80 daily and increased bidil to 2 tablets.    Seen in the clinic today and her weight has went up by 2lbs since last visit.  Still short of breath on exertion but still working, doing her ADL's can walk without having to stop.  Hasn't really improved since last visit. Not keeping track of her  fluid intake well and drinking more to try and make herself pee more.  No benefit from diuretics     Review of Systems: [y] = yes, _0  = no   . General: Weight gain Blue.Reese ]; Weight loss _1 ; Anorexia _2 ; Fatigue Blue.Reese ]; Fever _3 ; Chills _4 ; Weakness _5   . Cardiac: Chest pain/pressure _6 ; Resting SOB _7 ; Exertional SOB Blue.Reese ]; Orthopnea [ y]; Pedal Edema _8 ; Palpitations _9 ; Syncope _10 ; Presyncope _11 ; Paroxysmal nocturnal dyspnea_12   . Pulmonary: Cough _13 ; Wheezing_14 ; Hemoptysis_15 ; Sputum _16 ; Snoring _17   . GI: Vomiting_18 ; Dysphagia_19 ; Melena_20 ; Hematochezia _21 ; Heartburn_22 ; Abdominal pain _23 ; Constipation _24 ; Diarrhea _25 ; BRBPR _26   . GU: Hematuria_27 ; Dysuria _28 ; Nocturia_29   . Vascular: Pain in legs with walking _30 ; Pain in feet with lying flat _31 ; Non-healing sores _32 ; Stroke _33 ; TIA _34 ; Slurred speech _35 ;  . Neuro: Headaches_36 ; Vertigo_37 ; Seizures_38 ; Paresthesias_39 ;Blurred vision _40 ; Diplopia _41 ; Vision changes _42   . Ortho/Skin: Arthritis Blue.Reese ]; Joint pain _43 ; Muscle pain _44 ; Joint swelling _45 ; Back Pain _46 ; Rash _47   . Psych: Depression_48 ; Anxiety_49   . Heme: Bleeding problems _50 ; Clotting  disorders _0 ; Anemia _1   . Endocrine: Diabetes [ y]; Thyroid dysfunction_2    Home Medications Prior to Admission medications   Medication Sig Start Date End Date Taking? Authorizing Provider  aspirin EC 81 MG EC tablet Take 1 tablet (81 mg total) by mouth daily. Swallow whole. 06/19/20 06/14/21 Yes Hall, Carole N, DO  atorvastatin (LIPITOR) 40 MG tablet TAKE 1 TABLET BY MOUTH EVERY DAY Patient taking differently: Take 40 mg by mouth daily. 11/16/20  Yes Billie Ruddy, MD  blood glucose meter kit and supplies KIT Dispense based on patient and insurance preference. Use up to four times daily as directed. (FOR ICD-9 250.00, 250.01). 06/19/20  Yes Irene Pap N, DO  Blood Pressure KIT Use as directed for checking BP daily. 07/11/20  Yes Billie Ruddy, MD  calcitRIOL  (ROCALTROL) 0.25 MCG capsule TAKE 1 CAPSULE BY MOUTH EVERY DAY Patient taking differently: Take 0.25 mcg by mouth daily. 12/13/20  Yes Billie Ruddy, MD  carvedilol (COREG) 6.25 MG tablet TAKE 1 TABLET (6.25 MG TOTAL) BY MOUTH TWO TIMES DAILY WITH A MEAL. 01/08/21 01/08/22 Yes Arrien, Jimmy Picket, MD  [employer] Lancets Micro Thin 33G MISC Use as directed for testing blood sugar twice daily, more frequently for hyper or hypoglycemia. 12/06/20  Yes Billie Ruddy, MD  isosorbide-hydrALAZINE (BIDIL) 20-37.5 MG tablet Take 2 tablets by mouth 3 (three) times daily. 01/11/21 02/10/21 Yes Katherine Roan, MD  Torsemide 40 MG TABS Take 80 mg by mouth 2 (two) times daily for 2 days, THEN 80 mg daily. 01/11/21 02/12/21 Yes Katherine Roan, MD    Past Medical History: Past Medical History:  Diagnosis Date  . CAD (coronary artery disease)   . Diabetes mellitus without complication ([place])   . Essential hypertension   . Hyperlipidemia     Past Surgical History: Past Surgical History:  Procedure Laterality Date  . CARDIAC SURGERY     CABG  . CORONARY ARTERY BYPASS GRAFT      Family History:  Family History  Problem Relation Age of Onset  . Diabetes Mellitus II Mother   . Esophageal cancer Father     Social History: Social History   Socioeconomic History  . Marital status: Married    Spouse name: Not on file  . Number of children: Not on file  . Years of education: Not on file  . Highest education level: Not on file  Occupational History  . Not on file  Tobacco Use  . Smoking status: Never Smoker  . Smokeless tobacco: Never Used  Vaping Use  . Vaping Use: Never used  Substance and Sexual Activity  . Alcohol use: No  . Drug use: No  . Sexual activity: Not Currently  Other Topics Concern  . Not on file  Social History Narrative  . Not on file   Social Determinants of Health   Financial Resource Strain: Low Risk   . Difficulty of Paying Living Expenses: Not hard at all  Food  Insecurity: No Food Insecurity  . Worried About Charity fundraiser in the Last Year: Never true  . Ran Out of Food in the Last Year: Never true  Transportation Needs: No Transportation Needs  . Lack of Transportation (Medical): No  . Lack of Transportation (Non-Medical): No  Physical Activity: Insufficiently Active  . Days of Exercise per Week: 2 days  . Minutes of Exercise per Session: 20 min  Stress: Not on file  Social Connections: Not on file  Allergies:  No Known Allergies  Objective:    Vital Signs:   BP: ()/()  Arterial Line BP: ()/()    There were no vitals filed for this visit.   Physical Exam     General:  Elderly  No respiratory difficulty HEENT: Normal Neck: Supple. JVD to ear. Carotids 2+ bilat; no bruits. No lymphadenopathy or thyromegaly appreciated. Cor: PMI nondisplaced. Regular rate & rhythm. No rubs, gallops or murmurs. Lungs: diminished in bases, rales bilateral lung fields, no increased wob Abdomen: Soft, nontender, No hepatosplenomegaly. No bruits or masses. Good bowel sounds. Extremities: 3+ pitting edema bilateral lower extremities into thighs Neuro: Alert & oriented x 3, cranial nerves grossly intact. moves all 4 extremities w/o difficulty. Affect pleasant.   Telemetry   Not yet available  EKG   Not yet available  Labs     Basic Metabolic Panel: Recent Labs  Lab 01/11/21 1104 01/17/21 1006  NA 143 140  K 5.8* 5.2*  CL 103 99  CO2 35* 27  GLUCOSE 73 196*  BUN 61* 77*  CREATININE 3.13* 3.65*  CALCIUM 9.3 8.7*    Liver Function Tests: No results for input(s): AST, ALT, ALKPHOS, BILITOT, PROT, ALBUMIN in the last 168 hours. No results for input(s): LIPASE, AMYLASE in the last 168 hours. No results for input(s): AMMONIA in the last 168 hours.  CBC: No results for input(s): WBC, NEUTROABS, HGB, HCT, MCV, PLT in the last 168 hours.  Cardiac Enzymes: No results for input(s): CKTOTAL, CKMB, CKMBINDEX, TROPONINI in the last 168  hours.  BNP: BNP (last 3 results) Recent Labs    07/11/20 0321 01/03/21 1016 01/17/21 1006  BNP 1,575* >4,500.0* >4,500.0*    ProBNP (last 3 results) Recent Labs    12/06/20 0927  PROBNP >5000.0*     CBG: No results for input(s): GLUCAP in the last 168 hours.  Coagulation Studies: No results for input(s): LABPROT, INR in the last 72 hours.  Imaging: No results found.    Assessment/Plan   Acute on Chronic Combined Systolic and Diastolic CHF: -Likely mixed ischemic and NICM, poorly controlled HTN, poor medication adherence -EF down from 45-50% in 2019 to now 20-25% -NYHA Class III symptoms, markedly overloaded but not as symptomatic as I would expect weight up additional 2 lbs, REDS 46% last visit now 56% -BP better with increased bidil to 2 tabs continue  -Start lasix IV 120 bid and metolazone 2.5 daily -Will likely need RHC tomorrow and may require inotropic support for diuresis  - Options very limited with advanced CKD. Suspect she will need inotropes.   T2DM: -was very poorly controlled until renal function worsened  -not a candidate for SGLT2i with renal fx and hx of urosepsis -SSI   HTN: -better with increase in bidil to 2 tabs TID, continue  CKD4: -bmp today with worsening cr 3.1>3.6 -likely a combination of hypertensive and diabetic nephropathy, history of ATN from urosepsis, now a component of cardiorenal syndrome -clear evidence of  proteinuria, renal function too poor to start ARB therapy -monitor renal fx with diuresis  Hyperkalemia: -k 5.2 -getting lasix and metolazone should come down -if not improved tomorrow will add k binder   Glori Bickers, MD 01/17/2021, 4:20 PM  Advanced Heart Failure Team Pager [phone] (M-F; 7a - 5p)  Please contact [facility] for night-coverage after hours (4p -7a ) and weekends on [email or web address]

## 2021-01-17 NOTE — Progress Notes (Signed)
Report called to Marya Amsler, RN 6E. HF staff wheeled pt up to unit with all belongings.   Pricilla Holm, RN, BSN Heart Failure Nurse Navigator [phone]

## 2021-01-17 NOTE — H&P (Addendum)
 Advanced Heart Failure Team History and Physical Note   PCP:  Banks, Shannon R, MD  PCP-Cardiology: Philip Nahser, MD     Reason for Admission: Acute on Chronic Combined Systolic and Diastolic CHF   HPI:   Beverly Martin is a 64 y.o.  female  with a PMH significant for HFrEF, CAD s/p CABG, HTN, T2DM, HLD, CKD IIIb   Diagnosed with diabetes 2005, and had 3 vessel cardiac bypass surgery in 2006 at  possibly with Dr. Van Tright.  Said she didn't have good follow up afterwards.  She did continue her aspirin.    After this never followed up with Cardiology or CT surgery.  Managed primarily by PCP and a diabetes specialist.   Kidney function was okay until 2019 when she was admitted for urosepsis and klebsiella bacteremia it did improve before her discharge 2.0->1.1   Later admitted for volume overload, severe HTN on 05/2020 and cr at that time 2.54.  Given lasix had an echo which showed EF 45-50% with G2DD  Was doing okay seeing her pcp and then on 01/03/21 saw her pcp found to be volume overloaded and sent to the ED where she was admitted for acute on chronic systolic CHF. This was in the setting of chronic poorly controlled HTN and she ran out of her medications several times including just before admission.  She was diuresed with IV lasix, switched to PO torsemide then discharged but remained volume overloaded.    Saw her a week ago and she was clearly still volume overloaded after her recent admission but looked suprisingly good given her cardiac function and being severely volume overloaded.  We increased her diuretics substantially torsemide 80 bid x2 days then 80 daily and increased bidil to 2 tablets.  Seen in the clinic today and her weight has went up by 2lbs since last visit.  Still short of breath on exertion but still working, doing her ADL's can walk without having to stop.  Hasn't really improved since last visit. Not keeping track of her fluid intake well and  drinking more to try and make herself pee more.      Review of Systems: [y] = yes, [ ] = no   . General: Weight gain [y ]; Weight loss [ ]; Anorexia [ ]; Fatigue [y ]; Fever [ ]; Chills [ ]; Weakness [ ]  . Cardiac: Chest pain/pressure [ ]; Resting SOB [ ]; Exertional SOB [y ]; Orthopnea [ y]; Pedal Edema [ ]; Palpitations [ ]; Syncope [ ]; Presyncope [ ]; Paroxysmal nocturnal dyspnea[ ]  . Pulmonary: Cough [ ]; Wheezing[ ]; Hemoptysis[ ]; Sputum [ ]; Snoring [ ]  . GI: Vomiting[ ]; Dysphagia[ ]; Melena[ ]; Hematochezia [ ]; Heartburn[ ]; Abdominal pain [ ]; Constipation [ ]; Diarrhea [ ]; BRBPR [ ]  . GU: Hematuria[ ]; Dysuria [ ]; Nocturia[ ]  . Vascular: Pain in legs with walking [ ]; Pain in feet with lying flat [ ]; Non-healing sores [ ]; Stroke [ ]; TIA [ ]; Slurred speech [ ];  . Neuro: Headaches[ ]; Vertigo[ ]; Seizures[ ]; Paresthesias[ ];Blurred vision [ ]; Diplopia [ ]; Vision changes [ ]  . Ortho/Skin: Arthritis [y ]; Joint pain [ ]; Muscle pain [ ]; Joint swelling [ ]; Back Pain [ ]; Rash [ ]  . Psych: Depression[ ]; Anxiety[ ]  . Heme: Bleeding problems [ ]; Clotting disorders [ ]; Anemia [ ]  . Endocrine: Diabetes [   y]; Thyroid dysfunction[ ]   Home Medications Prior to Admission medications   Medication Sig Start Date End Date Taking? Authorizing Provider  aspirin EC 81 MG EC tablet Take 1 tablet (81 mg total) by mouth daily. Swallow whole. 06/19/20 06/14/21 Yes Hall, Carole N, DO  atorvastatin (LIPITOR) 40 MG tablet TAKE 1 TABLET BY MOUTH EVERY DAY Patient taking differently: Take 40 mg by mouth daily. 11/16/20  Yes Billie Ruddy, MD  blood glucose meter kit and supplies KIT Dispense based on patient and insurance preference. Use up to four times daily as directed. (FOR ICD-9 250.00, 250.01). 06/19/20  Yes Irene Pap N, DO  Blood Pressure KIT Use as directed for checking BP daily. 07/11/20  Yes Billie Ruddy, MD  calcitRIOL (ROCALTROL) 0.25 MCG capsule TAKE 1 CAPSULE BY  MOUTH EVERY DAY Patient taking differently: Take 0.25 mcg by mouth daily. 12/13/20  Yes Billie Ruddy, MD  carvedilol (COREG) 6.25 MG tablet TAKE 1 TABLET (6.25 MG TOTAL) BY MOUTH TWO TIMES DAILY WITH A MEAL. 01/08/21 01/08/22 Yes Arrien, Jimmy Picket, MD  [employer] Lancets Micro Thin 33G MISC Use as directed for testing blood sugar twice daily, more frequently for hyper or hypoglycemia. 12/06/20  Yes Billie Ruddy, MD  isosorbide-hydrALAZINE (BIDIL) 20-37.5 MG tablet Take 2 tablets by mouth 3 (three) times daily. 01/11/21 02/10/21 Yes Katherine Roan, MD  Torsemide 40 MG TABS Take 80 mg by mouth 2 (two) times daily for 2 days, THEN 80 mg daily. 01/11/21 02/12/21 Yes Katherine Roan, MD    Past Medical History: Past Medical History:  Diagnosis Date  . CAD (coronary artery disease)   . Diabetes mellitus without complication ([place])   . Essential hypertension   . Hyperlipidemia     Past Surgical History: Past Surgical History:  Procedure Laterality Date  . CARDIAC SURGERY     CABG  . CORONARY ARTERY BYPASS GRAFT      Family History:  Family History  Problem Relation Age of Onset  . Diabetes Mellitus II Mother   . Esophageal cancer Father     Social History: Social History   Socioeconomic History  . Marital status: Married    Spouse name: Not on file  . Number of children: Not on file  . Years of education: Not on file  . Highest education level: Not on file  Occupational History  . Not on file  Tobacco Use  . Smoking status: Never Smoker  . Smokeless tobacco: Never Used  Vaping Use  . Vaping Use: Never used  Substance and Sexual Activity  . Alcohol use: No  . Drug use: No  . Sexual activity: Not Currently  Other Topics Concern  . Not on file  Social History Narrative  . Not on file   Social Determinants of Health   Financial Resource Strain: Low Risk   . Difficulty of Paying Living Expenses: Not hard at all  Food Insecurity: No Food Insecurity  . Worried About  Charity fundraiser in the Last Year: Never true  . Ran Out of Food in the Last Year: Never true  Transportation Needs: No Transportation Needs  . Lack of Transportation (Medical): No  . Lack of Transportation (Non-Medical): No  Physical Activity: Insufficiently Active  . Days of Exercise per Week: 2 days  . Minutes of Exercise per Session: 20 min  Stress: Not on file  Social Connections: Not on file    Allergies:  No Known Allergies  Objective:  Vital Signs:   Pulse Rate:  [81] 81 (04/07 0952) BP: (130)/(62) 130/62 (04/07 0952) SpO2:  [95 %] 95 % (04/07 0952) Weight:  [65.3 kg (144 lb)] 65.3 kg (144 lb) (04/07 0952)   Filed Weights   01/17/21 0952  Weight: 65.3 kg (144 lb)     Physical Exam     General:  Well appearing. No respiratory difficulty HEENT: Normal Neck: Supple. JVD to ear. Carotids 2+ bilat; no bruits. No lymphadenopathy or thyromegaly appreciated. Cor: PMI nondisplaced. Regular rate & rhythm. No rubs, gallops or murmurs. Lungs: diminished in bases, rales bilateral lung fields, no increased wob Abdomen: Soft, nontender, No hepatosplenomegaly. No bruits or masses. Good bowel sounds. Extremities: 2+ pitting edema bilateral lower extremities Neuro: Alert & oriented x 3, cranial nerves grossly intact. moves all 4 extremities w/o difficulty. Affect pleasant.   Telemetry   Not yet available  EKG   Not yet available  Labs     Basic Metabolic Panel: Recent Labs  Lab 01/11/21 1104 01/17/21 1006  NA 143 140  K 5.8* 5.2*  CL 103 99  CO2 35* 27  GLUCOSE 73 196*  BUN 61* 77*  CREATININE 3.13* 3.65*  CALCIUM 9.3 8.7*    Liver Function Tests: No results for input(s): AST, ALT, ALKPHOS, BILITOT, PROT, ALBUMIN in the last 168 hours. No results for input(s): LIPASE, AMYLASE in the last 168 hours. No results for input(s): AMMONIA in the last 168 hours.  CBC: No results for input(s): WBC, NEUTROABS, HGB, HCT, MCV, PLT in the last 168  hours.  Cardiac Enzymes: No results for input(s): CKTOTAL, CKMB, CKMBINDEX, TROPONINI in the last 168 hours.  BNP: BNP (last 3 results) Recent Labs    07/11/20 0321 01/03/21 1016 01/17/21 1006  BNP 1,575* >4,500.0* >4,500.0*    ProBNP (last 3 results) Recent Labs    12/06/20 0927  PROBNP >5000.0*     CBG: No results for input(s): GLUCAP in the last 168 hours.  Coagulation Studies: No results for input(s): LABPROT, INR in the last 72 hours.  Imaging:  No results found.    Assessment/Plan   Acute on Chronic Combined Systolic and Diastolic CHF: -Likely mixed ischemic and NICM, poorly controlled HTN, poor medication adherence -EF down from 45-50% in 2019 to now 20-25% -NYHA Class III symptoms, markedly overloaded but not as symptomatic as I would expect weight up additional 2 lbs, REDS 46% last visit now 56% -BP better with increased bidil to 2 tabs continue  -Start lasix IV 120 bid and metolazone 2.5 daily -Will need RHC tomorrow and may require inotropic support for diuresis   T2DM: -was very poorly controlled until renal function worsened  -not a candidate for SGLT2i with renal fx and hx of urosepsis -SSI   HTN: -better with increase in bidil to 2 tabs TID, continue  CKD4: -bmp today with worsening cr 3.1>3.6 -likely a combination of hypertensive and diabetic nephropathy, history of ATN from urosepsis, now a component of cardiorenal syndrome -clear evidence of  proteinuria, renal function too poor to start ARB therapy -monitor renal fx with diuresis  Hyperkalemia: -k 5.2 -getting lasix and metolazone should come down -if not improved tomorrow will add k binder   Katherine Roan, MD 01/17/2021, 12:00 PM  Advanced Heart Failure Team Pager [phone] (M-F; 7a - 5p)  Please contact [facility] for night-coverage after hours (4p -7a ) and weekends on [email or web address]

## 2021-01-17 NOTE — Progress Notes (Addendum)
[facility]  PCP: Grier Mitts Primary Cardiologist:Nahser, Arnette Norris  HPI:  Beverly Martin is a 65 y.o.  female  with a PMH significant for HFrEF, CAD s/p CABG, HTN, T2DM, HLD, CKD IIIb   Diagnosed with diabetes 2005, and had 3 vessel cardiac bypass surgery in 2006 at [facility] possibly with Dr. Nils Pyle.  Said she didn't have good follow up afterwards.  She did continue her aspirin.    After this never followed up with Cardiology or CT surgery.  Managed primarily by PCP and a diabetes specialist.   Kidney function was okay until 2019 when she was admitted for urosepsis and klebsiella bacteremia it did improve before her discharge 2.0->1.1   Later admitted for volume overload, severe HTN on 05/2020 and cr at that time 2.54.  Given lasix had an echo which showed EF 45-50% with G2DD  Was doing okay seeing her pcp and then on 01/03/21 saw her pcp found to be volume overloaded and sent to the ED where she was admitted for acute on chronic systolic CHF.  This in the setting of chronic poorly controlled HTN and she ran out of her medications several times including just before admission.  She was diuresed with IV lasix, switched to PO torsemide then discharged but remained volume overloaded.    Saw her a week ago and she was clearly volume overloaded but looked suprisingly good given her cardiac function and being floridly overloaded.  We increased her diuretics substantially and increased bidil to 2 tablets.  Weight has went up since last visit by 2lbs.  Still short of breath on exertion.  Hasn't really improved since last visit.  Not keeping track of her fluid intake well and drinking more to try and make herself pee more.     ROS: All systems negative except as listed in HPI, PMH and Problem List.  SH:  Social History   Socioeconomic History  . Marital status: Married    Spouse name: Not on file  . Number of children: Not on file  . Years of  education: Not on file  . Highest education level: Not on file  Occupational History  . Not on file  Tobacco Use  . Smoking status: Never Smoker  . Smokeless tobacco: Never Used  Vaping Use  . Vaping Use: Never used  Substance and Sexual Activity  . Alcohol use: No  . Drug use: No  . Sexual activity: Not Currently  Other Topics Concern  . Not on file  Social History Narrative  . Not on file   Social Determinants of Health   Financial Resource Strain: Low Risk   . Difficulty of Paying Living Expenses: Not hard at all  Food Insecurity: No Food Insecurity  . Worried About Charity fundraiser in the Last Year: Never true  . Ran Out of Food in the Last Year: Never true  Transportation Needs: No Transportation Needs  . Lack of Transportation (Medical): No  . Lack of Transportation (Non-Medical): No  Physical Activity: Insufficiently Active  . Days of Exercise per Week: 2 days  . Minutes of Exercise per Session: 20 min  Stress: Not on file  Social Connections: Not on file  Intimate Partner Violence: Not on file    FH:  Family History  Problem Relation Age of Onset  . Diabetes Mellitus II Mother   . Esophageal cancer Father     Past Medical History:  Diagnosis Date  .  CAD (coronary artery disease)   . Diabetes mellitus without complication ([place])   . Essential hypertension   . Hyperlipidemia     Current Outpatient Medications  Medication Sig Dispense Refill  . aspirin EC 81 MG EC tablet Take 1 tablet (81 mg total) by mouth daily. Swallow whole. 360 tablet 0  . atorvastatin (LIPITOR) 40 MG tablet TAKE 1 TABLET BY MOUTH EVERY DAY (Patient taking differently: Take 40 mg by mouth daily.) 30 tablet 2  . blood glucose meter kit and supplies KIT Dispense based on patient and insurance preference. Use up to four times daily as directed. (FOR ICD-9 250.00, 250.01). 1 each 0  . Blood Pressure KIT Use as directed for checking BP daily. 1 kit 0  . calcitRIOL (ROCALTROL) 0.25 MCG  capsule TAKE 1 CAPSULE BY MOUTH EVERY DAY (Patient taking differently: Take 0.25 mcg by mouth daily.) 60 capsule 0  . carvedilol (COREG) 6.25 MG tablet TAKE 1 TABLET (6.25 MG TOTAL) BY MOUTH TWO TIMES DAILY WITH A MEAL. 60 tablet 0  . [facility] Lancets Micro Thin 33G MISC Use as directed for testing blood sugar twice daily, more frequently for hyper or hypoglycemia. 100 each 3  . isosorbide-hydrALAZINE (BIDIL) 20-37.5 MG tablet Take 2 tablets by mouth 3 (three) times daily. 180 tablet 0  . Torsemide 40 MG TABS Take 80 mg by mouth 2 (two) times daily for 2 days, THEN 80 mg daily. 62 tablet 3   No current facility-administered medications for this encounter.    Vitals:   01/17/21 0952  BP: 130/62  Pulse: 81  SpO2: 95%  Weight: 65.3 kg (144 lb)    PHYSICAL EXAM: Cardiac: JVD to ear, normal rate and rhythm, clear s1 and s2, 2/6 MR, rubs or gallops, bilateral 2+ pitting edema, Pulmonary: diminished bases, rales in all fields worse in lower fields, no increased work of breathing, not in distress Abdominal: soft and nontender Psych: Alert, conversant, in good spirits   ASSESSMENT & PLAN: Acute on Chronic Combined Systolic and Diastolic CHF: -Likely mixed ischemic and NICM, poorly controlled HTN, poor medication adherence -EF down from 45-50% in 2019 to now 20-25% -NYHA Class III symptoms, markedly overloaded but not as symptomatic as I would expect weight up additional 2 lbs, REDS 46% last visit now 56% -BP better with increased bidil to 2 tabs continue -Did not respond to outpatient diuresis renal fx worse today and remains overloaded will admit to inpatient service.  T2DM: -was very poorly controlled until renal function worsened  -not a candidate for SGLT2i with renal fx and hx of urosepsis  HTN: -remains poorly controlled -better with increase in bidil to 2 tabs TID  CKD4: -likely a combination of hypertensive and diabetic nephropathy, history of ATN from urosepsis, now a component of  cardiorenal syndrome -clear evidence of  proteinuria, renal function too poor to start ARB therapy

## 2021-01-17 NOTE — Progress Notes (Signed)
[facility] Heart Failure Pharmacist Encounter  HPI:  65 yo F with PMH of CHF, CAD s/p CABG, HTN, CKD IV, T2DM, and HLD. She presented to the ED on 01/03/21 with increasing shortness of breath and LE edema. She reportedly stopped taking furosemide and carvedilol about 2 weeks prior to admission due to not being able to get it refilled by PCP or cardiology office. An ECHO was done on 01/03/21 and LVEF was reduced to 20-25% (was 45-50% in Aug 2021). She was then discharged on 01/08/21.  She presented for follow up at the Heart Failure TOC clinic on 01/11/21 and was found to be volume overloaded (ReDS 46%) after taking her torsemide incorrectly (20 mg daily instead of 40 mg daily). Her BP was also elevated at 152/86. Her torsemide was increased to 80 mg BID x 2 days, followed by 80 mg daily. Her BiDil was increased to 2 tabs TID. Her K was elevated to 5.8 and was provided with [place] x 3 days (confirmed with pharmacy the following week to make sure this was picked up). She was also provided with a pill calendar to help her remember how many tabs to take from each bottle for AM, afternoon, and PM medication times.   Today, TOI SEBASTIAN presents to the [facility] for follow up. She is still having DOE but states she does not have shortness of breath at rest, has orthopnea/PND, and she is still edematous on exam. ReDS 58% today (was 46% last week in clinic). Her weight is up 2 lbs today. She has been weighing herself daily at home. She has not been checking her BP. She has some lightheadedness after taking her BiDil. She has not been following a fluid-restricted diet - she claims she has been drinking more water because she thought this would help her pee more. She has been taking all medications as prescribed and stated the pill calendar sheet helped her with understanding on how to take her medications.   HF Medications: Torsemide 80 mg daily Carvedilol  6.25 mg BID BiDil 20/37.5 mg 2 tabs TID   Has the patient been experiencing any side effects to the medications prescribed?  Yes - some lightheadedness following BiDil (recent dose increase)  Does the patient have any problems obtaining medications due to transportation or finances?   no  Understanding of regimen: fair Understanding of indications: fair Potential of compliance: fair Patient understands to avoid NSAIDs. Patient understands to avoid decongestants.   Pertinent Lab Values: . Serum creatinine 3.65 (baseline ~3), BUN 77, Potassium 5.2, Sodium 140, BNP >4500    Vital Signs: . Weight: 144 lbs (last clinic weight: 142 lbs) . Blood pressure: 130/62 mmHg  . Heart rate: 81 bpm . ReDS: 46%>58% (normal 25-35%)  Medication Assistance / Insurance Benefits Check: Does the patient have prescription insurance?  Yes Type of insurance plan: commercial insurance  Outpatient Pharmacy:  [facility] Is the patient willing to transition their outpatient pharmacy to utilize a [facility] outpatient pharmacy with or without mail order?   Pending  Assessment/Plan: 1) Chronic systolic CHF (EF 0000000), due to ICM and medication non-compliance. NYHA class III symptoms. - Admit to hospital for IV lasix - Continue BiDil 20/37.5 mg 2 tabs TID - No SGLT2i with history of urosepsis and advanced CKD - No ACE/ARB/ARNI or spironolactone with AKI on CKD  Kerby Nora, PharmD, BCPS Heart Failure Transitions of Care Clinic Pharmacist [phone]

## 2021-01-18 ENCOUNTER — Encounter (HOSPITAL_COMMUNITY)
Admission: AD | Disposition: A | Discharge status: Home / Self Care | Payer: Self-pay | Source: Ambulatory Visit | Attending: Internal Medicine

## 2021-01-18 ENCOUNTER — Encounter (HOSPITAL_COMMUNITY): Payer: Self-pay | Admitting: Internal Medicine

## 2021-01-18 DIAGNOSIS — I5043 Acute on chronic combined systolic (congestive) and diastolic (congestive) heart failure: Secondary | ICD-10-CM | POA: Diagnosis not present

## 2021-01-18 DIAGNOSIS — N184 Chronic kidney disease, stage 4 (severe): Secondary | ICD-10-CM | POA: Diagnosis not present

## 2021-01-18 DIAGNOSIS — I5023 Acute on chronic systolic (congestive) heart failure: Secondary | ICD-10-CM | POA: Diagnosis not present

## 2021-01-18 HISTORY — PX: RIGHT HEART CATH: CATH118263

## 2021-01-18 LAB — PROTIME-INR
INR: 1.1 (ref 0.8–1.2)
Prothrombin Time: 14 seconds (ref 11.4–15.2)

## 2021-01-18 LAB — POCT I-STAT EG7
Acid-Base Excess: 6 mmol/L — ABNORMAL HIGH (ref 0.0–2.0)
Acid-Base Excess: 7 mmol/L — ABNORMAL HIGH (ref 0.0–2.0)
Acid-Base Excess: 8 mmol/L — ABNORMAL HIGH (ref 0.0–2.0)
Bicarbonate: 33.6 mmol/L — ABNORMAL HIGH (ref 20.0–28.0)
Bicarbonate: 34.3 mmol/L — ABNORMAL HIGH (ref 20.0–28.0)
Bicarbonate: 34.9 mmol/L — ABNORMAL HIGH (ref 20.0–28.0)
Calcium, Ion: 1.13 mmol/L — ABNORMAL LOW (ref 1.15–1.40)
Calcium, Ion: 1.17 mmol/L (ref 1.15–1.40)
Calcium, Ion: 1.17 mmol/L (ref 1.15–1.40)
HCT: 30 % — ABNORMAL LOW (ref 36.0–46.0)
HCT: 30 % — ABNORMAL LOW (ref 36.0–46.0)
HCT: 31 % — ABNORMAL LOW (ref 36.0–46.0)
Hemoglobin: 10.2 g/dL — ABNORMAL LOW (ref 12.0–15.0)
Hemoglobin: 10.2 g/dL — ABNORMAL LOW (ref 12.0–15.0)
Hemoglobin: 10.5 g/dL — ABNORMAL LOW (ref 12.0–15.0)
O2 Saturation: 66 %
O2 Saturation: 71 %
O2 Saturation: 72 %
Potassium: 4.6 mmol/L (ref 3.5–5.1)
Potassium: 4.7 mmol/L (ref 3.5–5.1)
Potassium: 4.7 mmol/L (ref 3.5–5.1)
Sodium: 140 mmol/L (ref 135–145)
Sodium: 140 mmol/L (ref 135–145)
Sodium: 142 mmol/L (ref 135–145)
TCO2: 35 mmol/L — ABNORMAL HIGH (ref 22–32)
TCO2: 36 mmol/L — ABNORMAL HIGH (ref 22–32)
TCO2: 37 mmol/L — ABNORMAL HIGH (ref 22–32)
pCO2, Ven: 61.7 mmHg — ABNORMAL HIGH (ref 44.0–60.0)
pCO2, Ven: 61.8 mmHg — ABNORMAL HIGH (ref 44.0–60.0)
pCO2, Ven: 62.6 mmHg — ABNORMAL HIGH (ref 44.0–60.0)
pH, Ven: 7.344 (ref 7.250–7.430)
pH, Ven: 7.353 (ref 7.250–7.430)
pH, Ven: 7.355 (ref 7.250–7.430)
pO2, Ven: 37 mmHg (ref 32.0–45.0)
pO2, Ven: 40 mmHg (ref 32.0–45.0)
pO2, Ven: 41 mmHg (ref 32.0–45.0)

## 2021-01-18 LAB — HEPATIC FUNCTION PANEL
ALT: 21 U/L (ref 0–44)
AST: 14 U/L — ABNORMAL LOW (ref 15–41)
Albumin: 3.5 g/dL (ref 3.5–5.0)
Alkaline Phosphatase: 56 U/L (ref 38–126)
Bilirubin, Direct: 0.1 mg/dL (ref 0.0–0.2)
Indirect Bilirubin: 0.6 mg/dL (ref 0.3–0.9)
Total Bilirubin: 0.7 mg/dL (ref 0.3–1.2)
Total Protein: 6.1 g/dL — ABNORMAL LOW (ref 6.5–8.1)

## 2021-01-18 LAB — CBC WITH DIFFERENTIAL/PLATELET
Abs Immature Granulocytes: 0.02 10*3/uL (ref 0.00–0.07)
Basophils Absolute: 0 10*3/uL (ref 0.0–0.1)
Basophils Relative: 0 %
Eosinophils Absolute: 0.1 10*3/uL (ref 0.0–0.5)
Eosinophils Relative: 1 %
HCT: 31.1 % — ABNORMAL LOW (ref 36.0–46.0)
Hemoglobin: 9.7 g/dL — ABNORMAL LOW (ref 12.0–15.0)
Immature Granulocytes: 0 %
Lymphocytes Relative: 15 %
Lymphs Abs: 0.8 10*3/uL (ref 0.7–4.0)
MCH: 31.1 pg (ref 26.0–34.0)
MCHC: 31.2 g/dL (ref 30.0–36.0)
MCV: 99.7 fL (ref 80.0–100.0)
Monocytes Absolute: 0.5 10*3/uL (ref 0.1–1.0)
Monocytes Relative: 9 %
Neutro Abs: 4.2 10*3/uL (ref 1.7–7.7)
Neutrophils Relative %: 75 %
Platelets: 145 10*3/uL — ABNORMAL LOW (ref 150–400)
RBC: 3.12 MIL/uL — ABNORMAL LOW (ref 3.87–5.11)
RDW: 13.7 % (ref 11.5–15.5)
WBC: 5.6 10*3/uL (ref 4.0–10.5)
nRBC: 0 % (ref 0.0–0.2)

## 2021-01-18 LAB — BASIC METABOLIC PANEL
Anion gap: 10 (ref 5–15)
BUN: 84 mg/dL — ABNORMAL HIGH (ref 8–23)
CO2: 31 mmol/L (ref 22–32)
Calcium: 8.9 mg/dL (ref 8.9–10.3)
Chloride: 101 mmol/L (ref 98–111)
Creatinine, Ser: 3.7 mg/dL — ABNORMAL HIGH (ref 0.44–1.00)
GFR, Estimated: 13 mL/min — ABNORMAL LOW (ref >60)
Glucose, Bld: 79 mg/dL (ref 70–99)
Potassium: 4.5 mmol/L (ref 3.5–5.1)
Sodium: 142 mmol/L (ref 135–145)

## 2021-01-18 LAB — URINALYSIS, ROUTINE W REFLEX MICROSCOPIC
Bilirubin Urine: NEGATIVE
Glucose, UA: NEGATIVE mg/dL
Hgb urine dipstick: NEGATIVE
Ketones, ur: NEGATIVE mg/dL
Leukocytes,Ua: NEGATIVE
Nitrite: NEGATIVE
Protein, ur: 30 mg/dL — AB
Specific Gravity, Urine: 1.009 (ref 1.005–1.030)
pH: 6 (ref 5.0–8.0)

## 2021-01-18 LAB — GLUCOSE, CAPILLARY
Glucose-Capillary: 115 mg/dL — ABNORMAL HIGH (ref 70–99)
Glucose-Capillary: 115 mg/dL — ABNORMAL HIGH (ref 70–99)
Glucose-Capillary: 121 mg/dL — ABNORMAL HIGH (ref 70–99)
Glucose-Capillary: 150 mg/dL — ABNORMAL HIGH (ref 70–99)

## 2021-01-18 LAB — PROTEIN / CREATININE RATIO, URINE
Creatinine, Urine: 33.94 mg/dL
Protein Creatinine Ratio: 1.12 mg/mg{Cre} — ABNORMAL HIGH (ref 0.00–0.15)
Total Protein, Urine: 38 mg/dL

## 2021-01-18 LAB — SARS CORONAVIRUS 2 BY RT PCR (HOSPITAL ORDER, PERFORMED IN ~~LOC~~ HOSPITAL LAB): SARS Coronavirus 2: NEGATIVE

## 2021-01-18 SURGERY — RIGHT HEART CATH
Anesthesia: LOCAL

## 2021-01-18 MED ORDER — FUROSEMIDE 10 MG/ML IJ SOLN
120.0000 mg | Freq: Two times a day (BID) | INTRAVENOUS | Status: DC
  Administered 2021-01-18 – 2021-01-20 (×5): 120 mg via INTRAVENOUS
  Filled 2021-01-18: qty 10
  Filled 2021-01-18: qty 12
  Filled 2021-01-18 (×3): qty 10
  Filled 2021-01-18: qty 12
  Filled 2021-01-18: qty 2

## 2021-01-18 MED ORDER — METOLAZONE 5 MG PO TABS
2.5000 mg | ORAL_TABLET | Freq: Every day | ORAL | Status: DC
  Administered 2021-01-18 – 2021-01-20 (×3): 2.5 mg via ORAL
  Filled 2021-01-18 (×3): qty 1

## 2021-01-18 MED ORDER — SODIUM CHLORIDE 0.9% FLUSH: 3.0000 mL | INTRAVENOUS | Status: DC | PRN

## 2021-01-18 MED ORDER — SODIUM CHLORIDE 0.9% FLUSH
3.0000 mL | Freq: Two times a day (BID) | INTRAVENOUS | Status: DC
  Administered 2021-01-18 – 2021-01-26 (×7): 3 mL via INTRAVENOUS

## 2021-01-18 MED ORDER — SODIUM CHLORIDE 0.9 % IV SOLN: 250.0000 mL | INTRAVENOUS | Status: DC | PRN

## 2021-01-18 MED ORDER — ONDANSETRON HCL 4 MG/2ML IJ SOLN: 4.0000 mg | Freq: Four times a day (QID) | INTRAMUSCULAR | Status: DC | PRN

## 2021-01-18 MED ORDER — LIDOCAINE HCL (PF) 1 % IJ SOLN
INTRAMUSCULAR | Status: AC
  Filled 2021-01-18: qty 30

## 2021-01-18 MED ORDER — SODIUM CHLORIDE 0.9 % IV SOLN: INTRAVENOUS | Status: DC

## 2021-01-18 MED ORDER — LABETALOL HCL 5 MG/ML IV SOLN: 10.0000 mg | INTRAVENOUS | Status: AC | PRN

## 2021-01-18 MED ORDER — HEPARIN (PORCINE) IN NACL 1000-0.9 UT/500ML-% IV SOLN
INTRAVENOUS | Status: AC
  Filled 2021-01-18: qty 1000

## 2021-01-18 MED ORDER — SODIUM CHLORIDE 0.9 % IV SOLN
250.0000 mL | INTRAVENOUS | Status: DC | PRN
  Administered 2021-01-21: 250 mL via INTRAVENOUS

## 2021-01-18 MED ORDER — ENOXAPARIN SODIUM 30 MG/0.3ML ~~LOC~~ SOLN
30.0000 mg | SUBCUTANEOUS | Status: DC
  Administered 2021-01-19 – 2021-01-27 (×9): 30 mg via SUBCUTANEOUS
  Filled 2021-01-18 (×9): qty 0.3

## 2021-01-18 MED ORDER — LIDOCAINE HCL (PF) 1 % IJ SOLN
INTRAMUSCULAR | Status: DC | PRN
  Administered 2021-01-18: 2 mL

## 2021-01-18 MED ORDER — MILRINONE LACTATE IN DEXTROSE 20-5 MG/100ML-% IV SOLN
0.1250 ug/kg/min | INTRAVENOUS | Status: DC
  Administered 2021-01-18 – 2021-01-24 (×5): 0.125 ug/kg/min via INTRAVENOUS
  Filled 2021-01-18 (×5): qty 100

## 2021-01-18 MED ORDER — HYDRALAZINE HCL 20 MG/ML IJ SOLN: 10.0000 mg | INTRAMUSCULAR | Status: AC | PRN

## 2021-01-18 MED ORDER — SODIUM CHLORIDE 0.9% FLUSH: 3.0000 mL | Freq: Two times a day (BID) | INTRAVENOUS | Status: DC

## 2021-01-18 MED ORDER — ACETAMINOPHEN 325 MG PO TABS: 650.0000 mg | ORAL_TABLET | ORAL | Status: DC | PRN

## 2021-01-18 MED ORDER — HEPARIN (PORCINE) IN NACL 1000-0.9 UT/500ML-% IV SOLN
INTRAVENOUS | Status: DC | PRN
  Administered 2021-01-18: 500 mL

## 2021-01-18 SURGICAL SUPPLY — 6 items
CATH SWAN GANZ 7F STRAIGHT (CATHETERS) ×2 IMPLANT
GLIDESHEATH SLENDER 7FR .021G (SHEATH) ×2 IMPLANT
KIT MICROPUNCTURE NIT STIFF (SHEATH) ×2 IMPLANT
PACK CARDIAC CATHETERIZATION (CUSTOM PROCEDURE TRAY) ×2 IMPLANT
TRANSDUCER W/STOPCOCK (MISCELLANEOUS) ×2 IMPLANT
WIRE EMERALD 3MM-J .025X260CM (WIRE) ×2 IMPLANT

## 2021-01-18 NOTE — Progress Notes (Signed)
Patients oxygen sats in the low 90s (90-91%) on RA. When patient got up to walk to the restroom, I checked her sats and she went to 80% on RA. I applied 2l [place] and she came up to 93% while walking and when resting in chair, up to 97-100%. Patient felt more comfortable with oxygen on. She has stated in the past, she has needed oxygen for a short time until some fluid is off. Patient stable. Will leace 2l [place] on at this time

## 2021-01-18 NOTE — Progress Notes (Addendum)
Advanced Heart Failure Team Rounding Note   PCP:  Billie Ruddy, MD  PCP-Cardiology: Mertie Moores, MD     Reason for Admission: Acute on Chronic Combined Systolic and Diastolic CHF   HPI:    Weight down a few lbs, output recored as 1.5L.  BUN/Cr up a small amount 84/3.7.  Breathing is about the same.  Denies any chest pain.    Objective:    Vital Signs:   Temp:  [97.6 F (36.4 C)-98.2 F (36.8 C)] 97.8 F (36.6 C) (04/08 0423) Pulse Rate:  [88] 88 (04/07 2049) Resp:  [16] 16 (04/08 0423) BP: (128-146)/(68-80) 141/76 (04/08 0423) SpO2:  [91 %-92 %] 91 % (04/08 0423) Weight:  [63.7 kg] 63.7 kg (04/08 0541) Last BM Date: 01/16/21 Filed Weights   01/18/21 0541  Weight: 63.7 kg     Physical Exam     General:  Elderly  No respiratory difficulty HEENT: Normal Neck: Supple. JVD to ear. Carotids 2+ bilat; no bruits. No lymphadenopathy or thyromegaly appreciated. Cor: PMI nondisplaced. Regular rate & rhythm. No rubs, gallops or murmurs. Lungs: diminished in bases, rales bilateral lung fields, no increased wob Abdomen: Soft, nontender, No hepatosplenomegaly. No bruits or masses. Good bowel sounds. Extremities: 3+ pitting edema bilateral lower extremities into thighs Neuro: Alert & oriented x 3, cranial nerves grossly intact. moves all 4 extremities w/o difficulty. Affect pleasant.   Telemetry   NSR 90-90  EKG   pending  Labs     Basic Metabolic Panel: Recent Labs  Lab 01/11/21 1104 01/17/21 1006 01/18/21 0334  NA 143 140 142  K 5.8* 5.2* 4.5  CL 103 99 101  CO2 35* 27 31  GLUCOSE 73 196* 79  BUN 61* 77* 84*  CREATININE 3.13* 3.65* 3.70*  CALCIUM 9.3 8.7* 8.9    Liver Function Tests: No results for input(s): AST, ALT, ALKPHOS, BILITOT, PROT, ALBUMIN in the last 168 hours. No results for input(s): LIPASE, AMYLASE in the last 168 hours. No results for input(s): AMMONIA in the last 168 hours.  CBC: Recent Labs  Lab 01/17/21 1838 01/18/21 0334   WBC 6.7 5.6  NEUTROABS  --  4.2  HGB 10.6* 9.7*  HCT 33.1* 31.1*  MCV 99.4 99.7  PLT 156 145*    Cardiac Enzymes: No results for input(s): CKTOTAL, CKMB, CKMBINDEX, TROPONINI in the last 168 hours.  BNP: BNP (last 3 results) Recent Labs    07/11/20 0321 01/03/21 1016 01/17/21 1006  BNP 1,575* >4,500.0* >4,500.0*    ProBNP (last 3 results) Recent Labs    12/06/20 0927  PROBNP >5000.0*     CBG: Recent Labs  Lab 01/17/21 2224  GLUCAP 227*    Coagulation Studies: No results for input(s): LABPROT, INR in the last 72 hours.  Imaging: No results found.    Assessment/Plan   Acute on Chronic Combined Systolic and Diastolic CHF: -Likely mixed ischemic and NICM, poorly controlled HTN, poor medication adherence -EF down from 45-50% in 2019 to now 20-25% -NYHA Class III symptoms, markedly overloaded but not as symptomatic as I would expect  -continue bidil -GDMT limited by renal function, no bb with severely low EF and decompensation  -Will hold further diuretics for now until RHC today, may require inotropic support for diuresis -Options very limited with advanced CKD. Suspect she will need inotropes.   T2DM: -was very poorly controlled until renal function worsened  -not a candidate for SGLT2i with renal fx and hx of urosepsis -SSI sensitive/renal  HTN: -  better with increase in bidil to 2 tabs TID, continue  CKD4: -Worsening cr 3.1>3.6>3.7 -likely a combination of hypertensive and diabetic nephropathy, history of ATN from urosepsis, now a component of cardiorenal syndrome -clear evidence of  proteinuria, renal function too poor to start ARB therapy -monitor renal fx with diuresis  Hyperkalemia: -improved with diuretics 5.2>4.5  Katherine Roan, MD 01/18/2021, 7:39 AM  Advanced Heart Failure Team Pager [phone] (M-F; 7a - 5p)  Please contact [facility] for night-coverage after hours (4p -7a ) and weekends on [email or web address]  Patient seen and  examined with the above-signed Advanced Practice Provider and/or Housestaff. I personally reviewed laboratory data, imaging studies and relevant notes. I independently examined the patient and formulated the important aspects of the plan. I have edited the note to reflect any of my changes or salient points. I have personally discussed the plan with the patient and/or family.  Modest diuresis. Denies orthopnea or PND. Creatinine worse.  General:  Elderly  No resp difficulty HEENT: normal Neck: supple. JVP to ear Carotids 2+ bilat; no bruits. No lymphadenopathy or thryomegaly appreciated. Cor: PMI nondisplaced. Regular rate & rhythm. 2/6 MR Lungs: clear Abdomen: soft, nontender, nondistended. No hepatosplenomegaly. No bruits or masses. Good bowel sounds. Extremities: no cyanosis, clubbing, rash, 3+ edema Neuro: alert & orientedx3, cranial nerves grossly intact. moves all 4 extremities w/o difficulty. Affect pleasant  She remains markedly volume overloaded. Only modest response to diuretics. Creatinine up to 3.7. Will proceed with RHC today to assess for low output. I worry that a significant portion of this may also be intrinsic renal dysfunction and she is poor candidate for HD.   Glori Bickers, MD  10:28 AM     ,

## 2021-01-18 NOTE — Interval H&P Note (Signed)
History and Physical Interval Note:  01/18/2021 10:30 AM  Arn Medal  has presented today for surgery, with the diagnosis of heart failure.  The various methods of treatment have been discussed with the patient and family. After consideration of risks, benefits and other options for treatment, the patient has consented to  Procedure(s): RIGHT HEART CATH (N/A) as a surgical intervention.  The patient's history has been reviewed, patient examined, no change in status, stable for surgery.  I have reviewed the patient's chart and labs.  Questions were answered to the patient's satisfaction.     Tina Gruner

## 2021-01-18 NOTE — H&P (View-Only) (Signed)
Advanced Heart Failure Team Rounding Note   PCP:  Billie Ruddy, MD  PCP-Cardiology: Mertie Moores, MD     Reason for Admission: Acute on Chronic Combined Systolic and Diastolic CHF   HPI:    Weight down a few lbs, output recored as 1.5L.  BUN/Cr up a small amount 84/3.7.  Breathing is about the same.  Denies any chest pain.    Objective:    Vital Signs:   Temp:  [97.6 F (36.4 C)-98.2 F (36.8 C)] 97.8 F (36.6 C) (04/08 0423) Pulse Rate:  [88] 88 (04/07 2049) Resp:  [16] 16 (04/08 0423) BP: (128-146)/(68-80) 141/76 (04/08 0423) SpO2:  [91 %-92 %] 91 % (04/08 0423) Weight:  [63.7 kg] 63.7 kg (04/08 0541) Last BM Date: 01/16/21 Filed Weights   01/18/21 0541  Weight: 63.7 kg     Physical Exam     General:  Elderly  No respiratory difficulty HEENT: Normal Neck: Supple. JVD to ear. Carotids 2+ bilat; no bruits. No lymphadenopathy or thyromegaly appreciated. Cor: PMI nondisplaced. Regular rate & rhythm. No rubs, gallops or murmurs. Lungs: diminished in bases, rales bilateral lung fields, no increased wob Abdomen: Soft, nontender, No hepatosplenomegaly. No bruits or masses. Good bowel sounds. Extremities: 3+ pitting edema bilateral lower extremities into thighs Neuro: Alert & oriented x 3, cranial nerves grossly intact. moves all 4 extremities w/o difficulty. Affect pleasant.   Telemetry   NSR 90-90  EKG   pending  Labs     Basic Metabolic Panel: Recent Labs  Lab 01/11/21 1104 01/17/21 1006 01/18/21 0334  NA 143 140 142  K 5.8* 5.2* 4.5  CL 103 99 101  CO2 35* 27 31  GLUCOSE 73 196* 79  BUN 61* 77* 84*  CREATININE 3.13* 3.65* 3.70*  CALCIUM 9.3 8.7* 8.9    Liver Function Tests: No results for input(s): AST, ALT, ALKPHOS, BILITOT, PROT, ALBUMIN in the last 168 hours. No results for input(s): LIPASE, AMYLASE in the last 168 hours. No results for input(s): AMMONIA in the last 168 hours.  CBC: Recent Labs  Lab 01/17/21 1838 01/18/21 0334   WBC 6.7 5.6  NEUTROABS  --  4.2  HGB 10.6* 9.7*  HCT 33.1* 31.1*  MCV 99.4 99.7  PLT 156 145*    Cardiac Enzymes: No results for input(s): CKTOTAL, CKMB, CKMBINDEX, TROPONINI in the last 168 hours.  BNP: BNP (last 3 results) Recent Labs    07/11/20 0321 01/03/21 1016 01/17/21 1006  BNP 1,575* >4,500.0* >4,500.0*    ProBNP (last 3 results) Recent Labs    12/06/20 0927  PROBNP >5000.0*     CBG: Recent Labs  Lab 01/17/21 2224  GLUCAP 227*    Coagulation Studies: No results for input(s): LABPROT, INR in the last 72 hours.  Imaging: No results found.    Assessment/Plan   Acute on Chronic Combined Systolic and Diastolic CHF: -Likely mixed ischemic and NICM, poorly controlled HTN, poor medication adherence -EF down from 45-50% in 2019 to now 20-25% -NYHA Class III symptoms, markedly overloaded but not as symptomatic as I would expect  -continue bidil -GDMT limited by renal function, no bb with severely low EF and decompensation  -Will hold further diuretics for now until RHC today, may require inotropic support for diuresis -Options very limited with advanced CKD. Suspect she will need inotropes.   T2DM: -was very poorly controlled until renal function worsened  -not a candidate for SGLT2i with renal fx and hx of urosepsis -SSI sensitive/renal  HTN: -  better with increase in bidil to 2 tabs TID, continue  CKD4: -Worsening cr 3.1>3.6>3.7 -likely a combination of hypertensive and diabetic nephropathy, history of ATN from urosepsis, now a component of cardiorenal syndrome -clear evidence of  proteinuria, renal function too poor to start ARB therapy -monitor renal fx with diuresis  Hyperkalemia: -improved with diuretics 5.2>4.5  Katherine Roan, MD 01/18/2021, 7:39 AM  Advanced Heart Failure Team Pager [phone] (M-F; 7a - 5p)  Please contact [facility] for night-coverage after hours (4p -7a ) and weekends on [email or web address]  Patient seen and  examined with the above-signed Advanced Practice Provider and/or Housestaff. I personally reviewed laboratory data, imaging studies and relevant notes. I independently examined the patient and formulated the important aspects of the plan. I have edited the note to reflect any of my changes or salient points. I have personally discussed the plan with the patient and/or family.  Modest diuresis. Denies orthopnea or PND. Creatinine worse.  General:  Elderly  No resp difficulty HEENT: normal Neck: supple. JVP to ear Carotids 2+ bilat; no bruits. No lymphadenopathy or thryomegaly appreciated. Cor: PMI nondisplaced. Regular rate & rhythm. 2/6 MR Lungs: clear Abdomen: soft, nontender, nondistended. No hepatosplenomegaly. No bruits or masses. Good bowel sounds. Extremities: no cyanosis, clubbing, rash, 3+ edema Neuro: alert & orientedx3, cranial nerves grossly intact. moves all 4 extremities w/o difficulty. Affect pleasant  She remains markedly volume overloaded. Only modest response to diuretics. Creatinine up to 3.7. Will proceed with RHC today to assess for low output. I worry that a significant portion of this may also be intrinsic renal dysfunction and she is poor candidate for HD.   Glori Bickers, MD  10:28 AM     ,

## 2021-01-18 NOTE — Consult Note (Signed)
[facility]  HISTORY AND PHYSICAL  Beverly Martin is an 65 y.o. female.    Chief Complaint:  Swelling/ weight gain  HPI: Pt is a 79F with a PMH sig for HTN, HLD, CAD s/p CABG 2005, CHF with EF 45-50%, DM II who is now seen in consultation at the request of Dr Shan Levans for eval and recs re: AKI on CKD in the setting of severe volume overload and likely cardiorenal syndrome.    Pt had a CABG in 2005--> didn't have good followup with a cardiologist afterwards.  Was fairly stable as OP until 05/2020 when she had admission for urosepsis with klebsiella.  TTE at that time showed EF 45-50% with global hypokinesis and G2DD.   She was admitted March 2022 for acute on chronic CHF exacerbation and volume overload.  EF 20-25% at that time with G1DD.  She was diuresed aggressively but still had extra volume on when she was discharged.  Was seen for hospital followup and diuretics were increased, but she had increased weight gain despite this and was advised to come to the hospital for IV diuresis.    She was admitted yesterday  For IV diuresis.  Had [place] today with both elevated R and L heart pressures but normal cardiac output.  She is on a trial of milrinone to see if this augments diuresis.  In this setting we are asked to see.    Cr trend as follows: 1.11 (2019)-->2.5 (05/2020, urosepsis)--> 3.27 (06/2020) --> 2.58 (01/03/2021) --> 3.58 (01/07/21) --> 3.09 (01/08/21) --> 3.65 (01/17/21) --> 3/70 (01/18/21).  Pt reports heaviness in her legs right now.  She denies CP or SOB.    PMH: Past Medical History:  Diagnosis Date  . CAD (coronary artery disease)   . Diabetes mellitus without complication ([place])   . Essential hypertension   . Hyperlipidemia    PSH: Past Surgical History:  Procedure Laterality Date  . CARDIAC SURGERY     CABG  . CORONARY ARTERY BYPASS GRAFT    . RIGHT HEART CATH N/A 01/18/2021   Procedure: RIGHT HEART CATH;  Surgeon: Jolaine Artist, MD;  Location: [facility];  Service: Cardiovascular;  Laterality: N/A;    Past Medical History:  Diagnosis Date  . CAD (coronary artery disease)   . Diabetes mellitus without complication ([place])   . Essential hypertension   . Hyperlipidemia     Medications:   Scheduled: . [START ON 01/19/2021] enoxaparin (LOVENOX) injection  30 mg Subcutaneous Q24H  . insulin aspart  0-5 Units Subcutaneous QHS  . insulin aspart  0-9 Units Subcutaneous TID WC  . isosorbide-hydrALAZINE  2 tablet Oral TID  . metolazone  2.5 mg Oral Daily  . sodium chloride flush  3 mL Intravenous Q12H  . sodium chloride flush  3 mL Intravenous Q12H    Medications Prior to Admission  Medication Sig Dispense Refill  . aspirin EC 81 MG EC tablet Take 1 tablet (81 mg total) by mouth daily. Swallow whole. 360 tablet 0  . atorvastatin (LIPITOR) 40 MG tablet TAKE 1 TABLET BY MOUTH EVERY DAY (Patient taking differently: Take 40 mg by mouth daily.) 30 tablet 2  . blood glucose meter kit and supplies KIT Dispense based on patient and insurance preference. Use up to four times daily as directed. (FOR ICD-9 250.00, 250.01). 1 each 0  . Blood Pressure KIT Use as directed for checking BP daily. 1 kit 0  . calcitRIOL (ROCALTROL) 0.25 MCG capsule TAKE  1 CAPSULE BY MOUTH EVERY DAY (Patient taking differently: Take 0.25 mcg by mouth daily.) 60 capsule 0  . carvedilol (COREG) 6.25 MG tablet TAKE 1 TABLET (6.25 MG TOTAL) BY MOUTH TWO TIMES DAILY WITH A MEAL. (Patient taking differently: Take 6.25 mg by mouth 2 (two) times daily with a meal.) 60 tablet 0  . CVS Lancets Micro Thin 33G MISC Use as directed for testing blood sugar twice daily, more frequently for hyper or hypoglycemia. 100 each 3  . isosorbide-hydrALAZINE (BIDIL) 20-37.5 MG tablet Take 2 tablets by mouth 3 (three) times daily. 180 tablet 0  . Torsemide 40 MG TABS Take 80 mg by mouth 2 (two) times daily for 2 days, THEN 80 mg daily. 62 tablet 3    ALLERGIES:  No Known Allergies  FAM HX: Family  History  Problem Relation Age of Onset  . Diabetes Mellitus II Mother   . Esophageal cancer Father     Social History:   reports that she has never smoked. She has never used smokeless tobacco. She reports that she does not drink alcohol and does not use drugs.  ROS: ROS: all other systems reviewed and are negative except as per HPI  Blood pressure (!) 122/57, pulse 81, temperature 98 F (36.7 C), temperature source Oral, resp. rate 16, height 5\' 4"  (1.626 m), weight 63.7 kg, SpO2 100 %. PHYSICAL EXAM: Physical Exam  GEN: NAD, sitting in chair HEENT EOMI PERRL NECK ++ JVD PULM faint bibasilar crackles CV RRR ABD mildly bloated EXT 3+ tight edema to the thigh NEURO AAO x 3 nonfocal SKIN no rashes or lesions   Results for orders placed or performed during the hospital encounter of 01/17/21 (from the past 48 hour(s))  SARS Coronavirus 2 by RT PCR (hospital order, performed in [facility] hospital lab) Nasopharyngeal Nasopharyngeal Swab     Status: None   Collection Time: 01/17/21  6:28 PM   Specimen: Nasopharyngeal Swab  Result Value Ref Range   SARS Coronavirus 2 NEGATIVE NEGATIVE    Comment: (NOTE) SARS-CoV-2 target nucleic acids are NOT DETECTED.  The SARS-CoV-2 RNA is generally detectable in upper and lower respiratory specimens during the acute phase of infection. The lowest concentration of SARS-CoV-2 viral copies this assay can detect is 250 copies / mL. A negative result does not preclude SARS-CoV-2 infection and should not be used as the sole basis for treatment or other patient management decisions.  A negative result may occur with improper specimen collection / handling, submission of specimen other than nasopharyngeal swab, presence of viral mutation(s) within the areas targeted by this assay, and inadequate number of viral copies (<250 copies / mL). A negative result must be combined with clinical observations, patient history, and epidemiological  information.  Fact Sheet for Patients:   03/19/21  Fact Sheet for Healthcare Providers: BoilerBrush.com.cy  This test is not yet approved or  cleared by the https://pope.com/ FDA and has been authorized for detection and/or diagnosis of SARS-CoV-2 by FDA under an Emergency Use Authorization (EUA).  This EUA will remain in effect (meaning this test can be used) for the duration of the COVID-19 declaration under Section 564(b)(1) of the Act, 21 U.S.C. section 360bbb-3(b)(1), unless the authorization is terminated or revoked sooner.  Performed at [facility], [address] [place], [place]   CBC     Status: Abnormal   Collection Time: 01/17/21  6:38 PM  Result Value Ref Range   WBC 6.7 4.0 - 10.5  K/uL   RBC 3.33 (L) 3.87 - 5.11 MIL/uL   Hemoglobin 10.6 (L) 12.0 - 15.0 g/dL   HCT 33.1 (L) 36.0 - 46.0 %   MCV 99.4 80.0 - 100.0 fL   MCH 31.8 26.0 - 34.0 pg   MCHC 32.0 30.0 - 36.0 g/dL   RDW 13.6 11.5 - 15.5 %   Platelets 156 150 - 400 K/uL   nRBC 0.0 0.0 - 0.2 %    Comment: Performed at [facility], [place] [address] [place], [place] [address]  Glucose, capillary     Status: Abnormal   Collection Time: 01/17/21 10:24 PM  Result Value Ref Range   Glucose-Capillary 227 (H) 70 - 99 mg/dL    Comment: Glucose reference range applies only to samples taken after fasting for at least 8 hours.  Basic metabolic panel     Status: Abnormal   Collection Time: 01/18/21  3:34 AM  Result Value Ref Range   Sodium 142 135 - 145 mmol/L   Potassium 4.5 3.5 - 5.1 mmol/L   Chloride 101 98 - 111 mmol/L   CO2 31 22 - 32 mmol/L   Glucose, Bld 79 70 - 99 mg/dL    Comment: Glucose reference range applies only to samples taken after fasting for at least 8 hours.   BUN 84 (H) 8 - 23 mg/dL   Creatinine, Ser 3.70 (H) 0.44 - 1.00 mg/dL   Calcium 8.9 8.9 - 10.3 mg/dL   GFR, Estimated 13 (L) >60 mL/min    Comment:  (NOTE) Calculated using the CKD-EPI Creatinine Equation (2021)    Anion gap 10 5 - 15    Comment: Performed at [place] [address], [place], [place] [address]  CBC WITH DIFFERENTIAL     Status: Abnormal   Collection Time: 01/18/21  3:34 AM  Result Value Ref Range   WBC 5.6 4.0 - 10.5 K/uL   RBC 3.12 (L) 3.87 - 5.11 MIL/uL   Hemoglobin 9.7 (L) 12.0 - 15.0 g/dL   HCT 31.1 (L) 36.0 - 46.0 %   MCV 99.7 80.0 - 100.0 fL   MCH 31.1 26.0 - 34.0 pg   MCHC 31.2 30.0 - 36.0 g/dL   RDW 13.7 11.5 - 15.5 %   Platelets 145 (L) 150 - 400 K/uL   nRBC 0.0 0.0 - 0.2 %   Neutrophils Relative % 75 %   Neutro Abs 4.2 1.7 - 7.7 K/uL   Lymphocytes Relative 15 %   Lymphs Abs 0.8 0.7 - 4.0 K/uL   Monocytes Relative 9 %   Monocytes Absolute 0.5 0.1 - 1.0 K/uL   Eosinophils Relative 1 %   Eosinophils Absolute 0.1 0.0 - 0.5 K/uL   Basophils Relative 0 %   Basophils Absolute 0.0 0.0 - 0.1 K/uL   Immature Granulocytes 0 %   Abs Immature Granulocytes 0.02 0.00 - 0.07 K/uL    Comment: Performed at [facility], [address] [place], [place] [address]  Hepatic function panel     Status: Abnormal   Collection Time: 01/18/21  8:04 AM  Result Value Ref Range   Total Protein 6.1 (L) 6.5 - 8.1 g/dL   Albumin 3.5 3.5 - 5.0 g/dL   AST 14 (L) 15 - 41 U/L   ALT 21 0 - 44 U/L   Alkaline Phosphatase 56 38 - 126 U/L   Total Bilirubin 0.7 0.3 - 1.2 mg/dL   Bilirubin, Direct 0.1 0.0 - 0.2 mg/dL  Indirect Bilirubin 0.6 0.3 - 0.9 mg/dL    Comment: Performed at [facility], [place] [address], [place], [place] [address]  Protime-INR     Status: None   Collection Time: 01/18/21  8:04 AM  Result Value Ref Range   Prothrombin Time 14.0 11.4 - 15.2 seconds   INR 1.1 0.8 - 1.2    Comment: (NOTE) INR goal varies based on device and disease states. Performed at [facility], [place] [address] [place],  [address]   Glucose, capillary     Status: Abnormal   Collection Time: 01/18/21   8:10 AM  Result Value Ref Range   Glucose-Capillary 115 (H) 70 - 99 mg/dL    Comment: Glucose reference range applies only to samples taken after fasting for at least 8 hours.  POCT I-Stat EG7     Status: Abnormal   Collection Time: 01/18/21 10:55 AM  Result Value Ref Range   pH, Ven 7.355 7.250 - 7.430   pCO2, Ven 62.6 (H) 44.0 - 60.0 mmHg   pO2, Ven 41.0 32.0 - 45.0 mmHg   Bicarbonate 34.9 (H) 20.0 - 28.0 mmol/L   TCO2 37 (H) 22 - 32 mmol/L   O2 Saturation 72.0 %   Acid-Base Excess 8.0 (H) 0.0 - 2.0 mmol/L   Sodium 140 135 - 145 mmol/L   Potassium 4.7 3.5 - 5.1 mmol/L   Calcium, Ion 1.17 1.15 - 1.40 mmol/L   HCT 31.0 (L) 36.0 - 46.0 %   Hemoglobin 10.5 (L) 12.0 - 15.0 g/dL   Sample type VENOUS   POCT I-Stat EG7     Status: Abnormal   Collection Time: 01/18/21 10:55 AM  Result Value Ref Range   pH, Ven 7.353 7.250 - 7.430   pCO2, Ven 61.8 (H) 44.0 - 60.0 mmHg   pO2, Ven 40.0 32.0 - 45.0 mmHg   Bicarbonate 34.3 (H) 20.0 - 28.0 mmol/L   TCO2 36 (H) 22 - 32 mmol/L   O2 Saturation 71.0 %   Acid-Base Excess 7.0 (H) 0.0 - 2.0 mmol/L   Sodium 140 135 - 145 mmol/L   Potassium 4.7 3.5 - 5.1 mmol/L   Calcium, Ion 1.17 1.15 - 1.40 mmol/L   HCT 30.0 (L) 36.0 - 46.0 %   Hemoglobin 10.2 (L) 12.0 - 15.0 g/dL   Sample type VENOUS   POCT I-Stat EG7     Status: Abnormal   Collection Time: 01/18/21 10:59 AM  Result Value Ref Range   pH, Ven 7.344 7.250 - 7.430   pCO2, Ven 61.7 (H) 44.0 - 60.0 mmHg   pO2, Ven 37.0 32.0 - 45.0 mmHg   Bicarbonate 33.6 (H) 20.0 - 28.0 mmol/L   TCO2 35 (H) 22 - 32 mmol/L   O2 Saturation 66.0 %   Acid-Base Excess 6.0 (H) 0.0 - 2.0 mmol/L   Sodium 142 135 - 145 mmol/L   Potassium 4.6 3.5 - 5.1 mmol/L   Calcium, Ion 1.13 (L) 1.15 - 1.40 mmol/L   HCT 30.0 (L) 36.0 - 46.0 %   Hemoglobin 10.2 (L) 12.0 - 15.0 g/dL   Sample type VENOUS    Comment NOTIFIED PHYSICIAN   Glucose, capillary     Status: Abnormal   Collection Time: 01/18/21 11:55 AM  Result Value  Ref Range   Glucose-Capillary 115 (H) 70 - 99 mg/dL    Comment: Glucose reference range applies only to samples taken after fasting for at least 8 hours.  Glucose, capillary  Status: Abnormal   Collection Time: 01/18/21  4:58 PM  Result Value Ref Range   Glucose-Capillary 121 (H) 70 - 99 mg/dL    Comment: Glucose reference range applies only to samples taken after fasting for at least 8 hours.    CARDIAC CATHETERIZATION  Result Date: 01/18/2021 Findings: RA = 15 RV = 72/21 PA = 71/22 (40) PCW = 22 (v-wave 29) Fick cardiac output/index = 6.3/3.7 Thermo CO/CI = 4.3/2.6 PVR = 2.9 (Fick) Ao sat = 99% PA sat = 71%. 72% SVC sat = 66% Assessment: 1. Elevated biventricular pressures 2. Normal cardiac output Plan/Discussion: Continue attempts at diuresis. Suspect renal dysfunction is playing a major role. Will consult Nephrology. Can consider empiric trial of milrinone to see if this help but with normal cardiac output I am less optimistic about the role here. Beverly Bickers, MD 11:04 AM   Assessment/Plan 1.  AKI on CKD IV: 1.11 in 2019 and seems to have had a progression since then starting with urosepsis 2021.  Has had uptrend to 3.70 since then.  I am hoping some can be reversed by restoring euvolemia but longstanding DM (A1c 11.6 in 7209) and HTN have certainly taken their toll.  I'll do UA, UP/C, SPEP/ free light chains.  Renal US 05/2020 with echogenicity.  Agree with high-dose Lasix and metolazone.  No indication for RRT at present but will be following closely.  Some pressures on the lower side- may need to temporarily reduce BiDil to allow BP to come up slightly so we don't induce ATN with aggressive diuresis.  2.  Acute systolic CHF exacerbation: per AHF.  EF 20-25% on TTE 12/2020.  On Lasix, metolazone, and milrinone.    3.  DM II: recent A1c under control in the 6s.  Per primary  4.  Dispo: admitted  Beverly Martin 01/18/2021, 5:10 PM

## 2021-01-19 DIAGNOSIS — I5043 Acute on chronic combined systolic (congestive) and diastolic (congestive) heart failure: Secondary | ICD-10-CM | POA: Diagnosis not present

## 2021-01-19 LAB — CBC WITH DIFFERENTIAL/PLATELET
Abs Immature Granulocytes: 0.03 10*3/uL (ref 0.00–0.07)
Basophils Absolute: 0 10*3/uL (ref 0.0–0.1)
Basophils Relative: 0 %
Eosinophils Absolute: 0.1 10*3/uL (ref 0.0–0.5)
Eosinophils Relative: 2 %
HCT: 29.7 % — ABNORMAL LOW (ref 36.0–46.0)
Hemoglobin: 9.3 g/dL — ABNORMAL LOW (ref 12.0–15.0)
Immature Granulocytes: 1 %
Lymphocytes Relative: 15 %
Lymphs Abs: 0.9 10*3/uL (ref 0.7–4.0)
MCH: 31.2 pg (ref 26.0–34.0)
MCHC: 31.3 g/dL (ref 30.0–36.0)
MCV: 99.7 fL (ref 80.0–100.0)
Monocytes Absolute: 0.6 10*3/uL (ref 0.1–1.0)
Monocytes Relative: 9 %
Neutro Abs: 4.4 10*3/uL (ref 1.7–7.7)
Neutrophils Relative %: 73 %
Platelets: 147 10*3/uL — ABNORMAL LOW (ref 150–400)
RBC: 2.98 MIL/uL — ABNORMAL LOW (ref 3.87–5.11)
RDW: 13.4 % (ref 11.5–15.5)
WBC: 6 10*3/uL (ref 4.0–10.5)
nRBC: 0 % (ref 0.0–0.2)

## 2021-01-19 LAB — BASIC METABOLIC PANEL
Anion gap: 7 (ref 5–15)
BUN: 85 mg/dL — ABNORMAL HIGH (ref 8–23)
CO2: 34 mmol/L — ABNORMAL HIGH (ref 22–32)
Calcium: 8.8 mg/dL — ABNORMAL LOW (ref 8.9–10.3)
Chloride: 100 mmol/L (ref 98–111)
Creatinine, Ser: 3.84 mg/dL — ABNORMAL HIGH (ref 0.44–1.00)
GFR, Estimated: 12 mL/min — ABNORMAL LOW (ref >60)
Glucose, Bld: 101 mg/dL — ABNORMAL HIGH (ref 70–99)
Potassium: 4.1 mmol/L (ref 3.5–5.1)
Sodium: 141 mmol/L (ref 135–145)

## 2021-01-19 LAB — MAGNESIUM: Magnesium: 2.8 mg/dL — ABNORMAL HIGH (ref 1.7–2.4)

## 2021-01-19 LAB — GLUCOSE, CAPILLARY
Glucose-Capillary: 82 mg/dL (ref 70–99)
Glucose-Capillary: 166 mg/dL — ABNORMAL HIGH (ref 70–99)
Glucose-Capillary: 90 mg/dL (ref 70–99)
Glucose-Capillary: 169 mg/dL — ABNORMAL HIGH (ref 70–99)

## 2021-01-19 MED ORDER — POLYETHYLENE GLYCOL 3350 17 G PO PACK
17.0000 g | PACK | Freq: Every day | ORAL | Status: DC
  Administered 2021-01-19 – 2021-01-26 (×6): 17 g via ORAL
  Filled 2021-01-19 (×9): qty 1

## 2021-01-19 NOTE — Progress Notes (Signed)
Patient ID: Beverly Martin, female   DOB: [birth year], 65 y.o.   MRN: [id number]    Advanced Heart Failure Team Rounding Note   PCP:  Billie Ruddy, MD  PCP-Cardiology: Mertie Moores, MD     Reason for Admission: Acute on Chronic Combined Systolic and Diastolic CHF   HPI:    Patient started on milrinone 0.125 mcg/kg/min yesterday, getting Lasix 120 mg IV bid with metolazone 2.5 x 1.  Good diuresis yesterday, net negative 2187 cc. Creatinine 3.7 => 3.87.   Breathing better.    RHC: RA = 15 RV = 72/21 PA = 71/22 (40) PCW = 22 (v-wave 29) Fick cardiac output/index = 6.3/3.7 Thermo CO/CI = 4.3/2.6 PVR = 2.9 (Fick) Ao sat = 99% PA sat = 71%. 72% SVC sat = 66%   Objective:    Vital Signs:   Temp:  [97.7 F (36.5 C)-98.3 F (36.8 C)] 97.7 F (36.5 C) (04/09 0831) Pulse Rate:  [74-92] 81 (04/09 0831) Resp:  [13-21] 16 (04/09 0831) BP: (107-162)/(57-130) 132/58 (04/09 0831) SpO2:  [91 %-100 %] 100 % (04/09 0831) Weight:  [63.5 kg] 63.5 kg (04/09 0609) Last BM Date: 01/16/21 Filed Weights   01/18/21 0541 01/19/21 0609  Weight: 63.7 kg 63.5 kg     Physical Exam     General: NAD Neck: JVP 14-16 cm, no thyromegaly or thyroid nodule.  Lungs: Clear to auscultation bilaterally with normal respiratory effort. CV: Nondisplaced PMI.  Heart regular S1/S2, no S3/S4, no murmur.  1+ edema to knees. Abdomen: Soft, nontender, no hepatosplenomegaly, no distention.  Skin: Intact without lesions or rashes.  Neurologic: Alert and oriented x 3.  Psych: Normal affect. Extremities: No clubbing or cyanosis.  HEENT: Normal.    Telemetry   NSR 80s (personally reviewed)  Labs     Basic Metabolic Panel: Recent Labs  Lab 01/17/21 1006 01/18/21 0334 01/18/21 1055 01/18/21 1059 01/19/21 0300  NA 140 142 140  140 142 141  K 5.2* 4.5 4.7  4.7 4.6 4.1  CL 99 101  --   --  100  CO2 27 31  --   --  34*  GLUCOSE 196* 79  --   --  101*  BUN 77* 84*  --   --  85*  CREATININE  3.65* 3.70*  --   --  3.84*  CALCIUM 8.7* 8.9  --   --  8.8*  MG  --   --   --   --  2.8*    Liver Function Tests: Recent Labs  Lab 01/18/21 0804  AST 14*  ALT 21  ALKPHOS 56  BILITOT 0.7  PROT 6.1*  ALBUMIN 3.5   No results for input(s): LIPASE, AMYLASE in the last 168 hours. No results for input(s): AMMONIA in the last 168 hours.  CBC: Recent Labs  Lab 01/17/21 1838 01/18/21 0334 01/18/21 1055 01/18/21 1059 01/19/21 0300  WBC 6.7 5.6  --   --  6.0  NEUTROABS  --  4.2  --   --  4.4  HGB 10.6* 9.7* 10.2*  10.5* 10.2* 9.3*  HCT 33.1* 31.1* 30.0*  31.0* 30.0* 29.7*  MCV 99.4 99.7  --   --  99.7  PLT 156 145*  --   --  147*    Cardiac Enzymes: No results for input(s): CKTOTAL, CKMB, CKMBINDEX, TROPONINI in the last 168 hours.  BNP: BNP (last 3 results) Recent Labs    07/11/20 0321 01/03/21 1016 01/17/21 1006  BNP 1,575* >  4,500.0* >4,500.0*    ProBNP (last 3 results) Recent Labs    12/06/20 0927  PROBNP >5000.0*     CBG: Recent Labs  Lab 01/18/21 0810 01/18/21 1155 01/18/21 1658 01/18/21 2109 01/19/21 0726  GLUCAP 115* 115* 121* 150* 82    Coagulation Studies: Recent Labs    01/18/21 0804  LABPROT 14.0  INR 1.1    Imaging: CARDIAC CATHETERIZATION  Result Date: 01/18/2021 Findings: RA = 15 RV = 72/21 PA = 71/22 (40) PCW = 22 (v-wave 29) Fick cardiac output/index = 6.3/3.7 Thermo CO/CI = 4.3/2.6 PVR = 2.9 (Fick) Ao sat = 99% PA sat = 71%. 72% SVC sat = 66% Assessment: 1. Elevated biventricular pressures 2. Normal cardiac output Plan/Discussion: Continue attempts at diuresis. Suspect renal dysfunction is playing a major role. Will consult Nephrology. Can consider empiric trial of milrinone to see if this help but with normal cardiac output I am less optimistic about the role here. Glori Bickers, MD 11:04 AM     Assessment/Plan   Acute on Chronic Combined Systolic and Diastolic CHF: -Likely mixed ischemic and NICM, poorly controlled  HTN, poor medication adherence -EF down from 45-50% in 2019 to now 20-25% -NYHA Class III symptoms, markedly overloaded.  -continue bidil 2 tab tid, BP stable.  -GDMT limited by renal function, no bb with severely low EF and decompensation  -Options very limited with advanced CKD. She is volume overloaded on exam still, we are trying empiric milrinone 0.125 mcg/kg/min with Lasix IV + metolazone.  Creatinine mildly higher today at 3.84 but good diuresis, will continue current regimen today.   T2DM: -was very poorly controlled until renal function worsened  -not a candidate for SGLT2i with renal fx and hx of urosepsis -SSI sensitive/renal  HTN: -better with increase in bidil to 2 tabs TID, continue  CKD4: -Worsening cr 3.1>3.6>3.7>3.87 -likely a combination of hypertensive and diabetic nephropathy, history of ATN from urosepsis, now a component of cardiorenal syndrome -clear evidence of  proteinuria, renal function too poor to start ARB therapy -monitor renal fx with diuresis, nephrology following.  -Poor HD candidate.   Hyperkalemia: -Resolved.   Loralie Champagne, MD 01/19/2021, 8:56 AM  Advanced Heart Failure Team Pager [phone] (M-F; 7a - 5p)  Please contact [facility] for night-coverage after hours (4p -7a ) and weekends on [email or web address]    ,

## 2021-01-19 NOTE — Plan of Care (Signed)
?  Problem: Education: ?Goal: Ability to verbalize understanding of medication therapies will improve ?Outcome: Progressing ?  ?Problem: Activity: ?Goal: Capacity to carry out activities will improve ?Outcome: Progressing ?  ?Problem: Cardiac: ?Goal: Ability to achieve and maintain adequate cardiopulmonary perfusion will improve ?Outcome: Progressing ?  ?

## 2021-01-19 NOTE — Progress Notes (Signed)
[facility] Progress Note    Assessment/ Plan:   1.  AKI on CKD IV: 1.11 in 2019 and seems to have had a progression since then starting with urosepsis 2021.  Has had uptrend to 3.70 since then.  I am hoping some can be reversed by restoring euvolemia but longstanding DM (A1c 11.6 in [id number]) and HTN have certainly taken their toll.  UA, UP/C bland with 1.12 g proteinuria.  SPEP/ free light chains pending.  Renal US 05/2020 with echogenicity.  Agree with high-dose Lasix and metolazone.  No indication for RRT at present but will be following closely.  Some pressures on the lower side- may need to temporarily reduce BiDil to allow BP to come up slightly so we don't induce ATN with aggressive diuresis. - Cr is up again today--> may have to back off on metolazone temporarily if keeps uptrending tomorrow  2.  Acute systolic CHF exacerbation: per AHF.  EF 20-25% on TTE 12/2020.  On Lasix, metolazone, and milrinone.    3.  DM II: recent A1c under control in the 6s.  Per primary  4.  Dispo: admitted  Subjective:    > 3L UOP yeserday.  Cr inching upwards at 3.84.  Feeling OK.     Objective:   BP (!) 132/58 (BP Location: Left Arm)   Pulse 81   Temp 97.7 F (36.5 C) (Oral)   Resp 16   Ht '5\' 4"'$  (1.626 m)   Wt 63.5 kg   SpO2 100%   BMI 24.03 kg/m   Intake/Output Summary (Last 24 hours) at 01/19/2021 1141 Last data filed at 01/19/2021 0842 Gross per 24 hour  Intake 692.76 ml  Output 3600 ml  Net -2907.24 ml   Weight change: -0.185 kg  Physical Exam: GEN: NAD, sitting in chair HEENT EOMI PERRL NECK ++ JVD PULM faint bibasilar crackles, largely unchanged CV RRR ABD mildly bloated EXT 3+ tight edema to the thigh NEURO AAO x 3 nonfocal SKIN no rashes or lesions  Imaging: CARDIAC CATHETERIZATION  Result Date: 01/18/2021 Findings: RA = 15 RV = 72/21 PA = 71/22 (40) PCW = 22 (v-wave 29) Fick cardiac output/index = 6.3/3.7 Thermo CO/CI = 4.3/2.6 PVR = 2.9 (Fick) Ao sat = 99%  PA sat = 71%. 72% SVC sat = 66% Assessment: 1. Elevated biventricular pressures 2. Normal cardiac output Plan/Discussion: Continue attempts at diuresis. Suspect renal dysfunction is playing a major role. Will consult Nephrology. Can consider empiric trial of milrinone to see if this help but with normal cardiac output I am less optimistic about the role here. Glori Bickers, MD 11:04 AM   Labs: BMET Recent Labs  Lab 01/17/21 1006 01/18/21 0334 01/18/21 1055 01/18/21 1059 01/19/21 0300  NA 140 142 140  140 142 141  K 5.2* 4.5 4.7  4.7 4.6 4.1  CL 99 101  --   --  100  CO2 27 31  --   --  34*  GLUCOSE 196* 79  --   --  101*  BUN 77* 84*  --   --  85*  CREATININE 3.65* 3.70*  --   --  3.84*  CALCIUM 8.7* 8.9  --   --  8.8*   CBC Recent Labs  Lab 01/17/21 1838 01/18/21 0334 01/18/21 1055 01/18/21 1059 01/19/21 0300  WBC 6.7 5.6  --   --  6.0  NEUTROABS  --  4.2  --   --  4.4  HGB 10.6* 9.7* 10.2*  10.5* 10.2*  9.3*  HCT 33.1* 31.1* 30.0*  31.0* 30.0* 29.7*  MCV 99.4 99.7  --   --  99.7  PLT 156 145*  --   --  147*    Medications:    . enoxaparin (LOVENOX) injection  30 mg Subcutaneous Q24H  . insulin aspart  0-5 Units Subcutaneous QHS  . insulin aspart  0-9 Units Subcutaneous TID WC  . isosorbide-hydrALAZINE  2 tablet Oral TID  . metolazone  2.5 mg Oral Daily  . polyethylene glycol  17 g Oral Daily  . sodium chloride flush  3 mL Intravenous Q12H  . sodium chloride flush  3 mL Intravenous Q12H      Madelon Lips, MD 01/19/2021, 11:41 AM

## 2021-01-20 DIAGNOSIS — N179 Acute kidney failure, unspecified: Secondary | ICD-10-CM | POA: Diagnosis not present

## 2021-01-20 DIAGNOSIS — I5043 Acute on chronic combined systolic (congestive) and diastolic (congestive) heart failure: Secondary | ICD-10-CM | POA: Diagnosis not present

## 2021-01-20 LAB — CBC WITH DIFFERENTIAL/PLATELET
Abs Immature Granulocytes: 0.02 10*3/uL (ref 0.00–0.07)
Basophils Absolute: 0 10*3/uL (ref 0.0–0.1)
Basophils Relative: 0 %
Eosinophils Absolute: 0.1 10*3/uL (ref 0.0–0.5)
Eosinophils Relative: 2 %
HCT: 28.4 % — ABNORMAL LOW (ref 36.0–46.0)
Hemoglobin: 9.1 g/dL — ABNORMAL LOW (ref 12.0–15.0)
Immature Granulocytes: 0 %
Lymphocytes Relative: 11 %
Lymphs Abs: 0.6 10*3/uL — ABNORMAL LOW (ref 0.7–4.0)
MCH: 31.9 pg (ref 26.0–34.0)
MCHC: 32 g/dL (ref 30.0–36.0)
MCV: 99.6 fL (ref 80.0–100.0)
Monocytes Absolute: 0.5 10*3/uL (ref 0.1–1.0)
Monocytes Relative: 10 %
Neutro Abs: 4 10*3/uL (ref 1.7–7.7)
Neutrophils Relative %: 77 %
Platelets: 142 10*3/uL — ABNORMAL LOW (ref 150–400)
RBC: 2.85 MIL/uL — ABNORMAL LOW (ref 3.87–5.11)
RDW: 13.2 % (ref 11.5–15.5)
WBC: 5.2 10*3/uL (ref 4.0–10.5)
nRBC: 0 % (ref 0.0–0.2)

## 2021-01-20 LAB — GLUCOSE, CAPILLARY
Glucose-Capillary: 96 mg/dL (ref 70–99)
Glucose-Capillary: 297 mg/dL — ABNORMAL HIGH (ref 70–99)
Glucose-Capillary: 103 mg/dL — ABNORMAL HIGH (ref 70–99)
Glucose-Capillary: 179 mg/dL — ABNORMAL HIGH (ref 70–99)

## 2021-01-20 LAB — BASIC METABOLIC PANEL
Anion gap: 10 (ref 5–15)
BUN: 84 mg/dL — ABNORMAL HIGH (ref 8–23)
CO2: 37 mmol/L — ABNORMAL HIGH (ref 22–32)
Calcium: 8.8 mg/dL — ABNORMAL LOW (ref 8.9–10.3)
Chloride: 94 mmol/L — ABNORMAL LOW (ref 98–111)
Creatinine, Ser: 3.45 mg/dL — ABNORMAL HIGH (ref 0.44–1.00)
GFR, Estimated: 14 mL/min — ABNORMAL LOW (ref >60)
Glucose, Bld: 187 mg/dL — ABNORMAL HIGH (ref 70–99)
Potassium: 4.1 mmol/L (ref 3.5–5.1)
Sodium: 141 mmol/L (ref 135–145)

## 2021-01-20 NOTE — Progress Notes (Signed)
Patient on 2L [place] Oxygen with saturations 99-100 % sitting up in chair.  Oxygen weaned to off with sats decreasing to 89% at rest.  Patient placed back on 1 L [place] at this time with sats 95% at rest.

## 2021-01-20 NOTE — Progress Notes (Signed)
Patient ID: Beverly Martin, female   DOB: March 17, 1956, 65 y.o.   MRN: [id number]    Advanced Heart Failure Team Rounding Note   PCP:  Billie Ruddy, MD  PCP-Cardiology: Mertie Moores, MD     Reason for Admission: Acute on Chronic Combined Systolic and Diastolic CHF   HPI:    Remains on milrinone 0.125 mcg/kg/min, getting Lasix 120 mg IV bid with metolazone 2.5   Feeling better. Diuresing well. Weight down 9 pounds. Creatinine starting to improve some 3.84 -> 3.45  Denies orthopnea or PND. No CP    Meds:     . enoxaparin (LOVENOX) injection  30 mg Subcutaneous Q24H  . insulin aspart  0-5 Units Subcutaneous QHS  . insulin aspart  0-9 Units Subcutaneous TID WC  . isosorbide-hydrALAZINE  2 tablet Oral TID  . metolazone  2.5 mg Oral Daily  . polyethylene glycol  17 g Oral Daily  . sodium chloride flush  3 mL Intravenous Q12H  . sodium chloride flush  3 mL Intravenous Q12H   . sodium chloride 8 mL/hr at 01/20/21 0648  . sodium chloride    . furosemide 120 mg (01/20/21 0828)  . milrinone 0.125 mcg/kg/min (01/20/21 [id number])      Objective:    Vital Signs:   Temp:  [97.4 F (36.3 C)-98 F (36.7 C)] 97.4 F (36.3 C) (04/10 0815) Pulse Rate:  [83-96] 96 (04/10 0815) Resp:  [15-18] 16 (04/10 0815) BP: (119-138)/(58-77) 119/64 (04/10 0815) SpO2:  [90 %-100 %] 99 % (04/10 0815) Weight:  [60 kg] 60 kg (04/10 0429) Last BM Date: 01/19/21 Filed Weights   01/18/21 0541 01/19/21 0609 01/20/21 0429  Weight: 63.7 kg 63.5 kg 60 kg     Physical Exam     General:  Sitting up. No resp difficulty HEENT: normal Neck: supple. no JVD. Carotids 2+ bilat; no bruits. No lymphadenopathy or thryomegaly appreciated. Cor: PMI nondisplaced. Regular rate & rhythm. No rubs, gallops or murmurs. Lungs: clear Abdomen: soft, nontender, nondistended. No hepatosplenomegaly. No bruits or masses. Good bowel sounds. Extremities: no cyanosis, clubbing, rash, 2-3+ edema into thighs Neuro: alert &  orientedx3, cranial nerves grossly intact. moves all 4 extremities w/o difficulty. Affect pleasant   Telemetry   NSR 90s Personally reviewed  Labs     Basic Metabolic Panel: Recent Labs  Lab 01/17/21 1006 01/18/21 0334 01/18/21 1055 01/18/21 1059 01/19/21 0300 01/20/21 0116  NA 140 142 140  140 142 141 141  K 5.2* 4.5 4.7  4.7 4.6 4.1 4.1  CL 99 101  --   --  100 94*  CO2 27 31  --   --  34* 37*  GLUCOSE 196* 79  --   --  101* 187*  BUN 77* 84*  --   --  85* 84*  CREATININE 3.65* 3.70*  --   --  3.84* 3.45*  CALCIUM 8.7* 8.9  --   --  8.8* 8.8*  MG  --   --   --   --  2.8*  --     Liver Function Tests: Recent Labs  Lab 01/18/21 0804  AST 14*  ALT 21  ALKPHOS 56  BILITOT 0.7  PROT 6.1*  ALBUMIN 3.5   No results for input(s): LIPASE, AMYLASE in the last 168 hours. No results for input(s): AMMONIA in the last 168 hours.  CBC: Recent Labs  Lab 01/17/21 1838 01/18/21 0334 01/18/21 1055 01/18/21 1059 01/19/21 0300 01/20/21 0116  WBC 6.7 5.6  --   --  6.0 5.2  NEUTROABS  --  4.2  --   --  4.4 4.0  HGB 10.6* 9.7* 10.2*  10.5* 10.2* 9.3* 9.1*  HCT 33.1* 31.1* 30.0*  31.0* 30.0* 29.7* 28.4*  MCV 99.4 99.7  --   --  99.7 99.6  PLT 156 145*  --   --  147* 142*    Cardiac Enzymes: No results for input(s): CKTOTAL, CKMB, CKMBINDEX, TROPONINI in the last 168 hours.  BNP: BNP (last 3 results) Recent Labs    07/11/20 0321 01/03/21 1016 01/17/21 1006  BNP 1,575* >4,500.0* >4,500.0*    ProBNP (last 3 results) Recent Labs    12/06/20 0927  PROBNP >5000.0*     CBG: Recent Labs  Lab 01/19/21 0726 01/19/21 1105 01/19/21 1603 01/19/21 2037 01/20/21 0739  GLUCAP 82 166* 90 169* 96    Coagulation Studies: Recent Labs    01/18/21 0804  LABPROT 14.0  INR 1.1    Imaging: CARDIAC CATHETERIZATION  Result Date: 01/18/2021 Findings: RA = 15 RV = 72/21 PA = 71/22 (40) PCW = 22 (v-wave 29) Fick cardiac output/index = 6.3/3.7 Thermo CO/CI =  4.3/2.6 PVR = 2.9 (Fick) Ao sat = 99% PA sat = 71%. 72% SVC sat = 66% Assessment: 1. Elevated biventricular pressures 2. Normal cardiac output Plan/Discussion: Continue attempts at diuresis. Suspect renal dysfunction is playing a major role. Will consult Nephrology. Can consider empiric trial of milrinone to see if this help but with normal cardiac output I am less optimistic about the role here. Glori Bickers, MD 11:04 AM     Assessment/Plan   1. Acute on Chronic Combined Systolic and Diastolic CHF: - Likely mixed ischemic and NICM, poorly controlled HTN, poor medication adherence - EF down from 45-50% in 2019 to now 20-25% - NYHA Class III symptoms, markedly overloaded.  - Will continue milrinone and IV diuresis for now - GDMT limited by renal function, no bb with severely low EF and decompensation  - Options very limited with advanced CKD. Not candidate for advanced HF therapies or HD. Hopefully can diurese and stabilize her with milrinone and IV lasix.   2. T2DM: - was very poorly controlled until renal function worsened  - not a candidate for SGLT2i with renal fx and hx of urosepsis - SSI sensitive/renal  3. HTN: - BP on low end with need for renal perfusion will drop Bidil to 1 tab tid to keep SBP > 120  4. AKI on CKD4: - Improving modestly with milrinone and diuresis cr 3.1>3.6>3.7>3.87> 3.45 - likely a combination of hypertensive and diabetic nephropathy,now a component of cardiorenal syndrome - monitor renal fx with diuresis, continue milrinone support - nephrology following (thanks) - Poor HD candidate.    Glori Bickers, MD 01/20/2021, 8:34 AM  Advanced Heart Failure Team Pager [phone] (M-F; 7a - 5p)  Please contact [facility] for night-coverage after hours (4p -7a ) and weekends on [email or web address]    ,

## 2021-01-20 NOTE — Progress Notes (Signed)
[facility] Progress Note    Assessment/ Plan:   1.  AKI on CKD IV: 1.11 in 2019 and seems to have had a progression since then starting with urosepsis 2021.  Has had uptrend to 3.70 since then.  I am hoping some can be reversed by restoring euvolemia but longstanding DM (A1c 11.6 in [id number]) and HTN have certainly taken their toll.  UA, UP/C bland with 1.12 g proteinuria.  SPEP/ free light chains pending.  Renal US 05/2020 with echogenicity.  Agree with high-dose Lasix and metolazone.  No indication for RRT at present but will be following closely.  Some pressures on the lower side- may need to temporarily reduce BiDil to allow BP to come up slightly so we don't induce ATN with aggressive diuresis. - Cr 3.8--> 3.5 today- no changes needed  2.  Acute systolic CHF exacerbation: per AHF.  EF 20-25% on TTE 12/2020.  On Lasix, metolazone, and milrinone.    3.  DM II: recent A1c under control in the 6s.  Per primary  4.  Dispo: admitted  Subjective:    Robust UOP continues, cr down to 3.5. Down to 1L O2.     Objective:   BP 119/64 (BP Location: Left Arm)   Pulse 96   Temp (!) 97.4 F (36.3 C) (Oral)   Resp 16   Ht '5\' 4"'$  (1.626 m)   Wt 60 kg   SpO2 99%   BMI 22.69 kg/m   Intake/Output Summary (Last 24 hours) at 01/20/2021 1155 Last data filed at 01/20/2021 0945 Gross per 24 hour  Intake 1802.9 ml  Output 3400 ml  Net -1597.1 ml   Weight change: -3.534 kg  Physical Exam: GEN: NAD, sitting in chair HEENT EOMI PERRL NECK ++ JVD PULM faint bibasilar crackles, largely unchanged CV RRR ABD mildly bloated EXT 2+ edema, improving NEURO AAO x 3 nonfocal SKIN no rashes or lesions  Imaging: No results found.  Labs: BMET Recent Labs  Lab 01/17/21 1006 01/18/21 0334 01/18/21 1055 01/18/21 1059 01/19/21 0300 01/20/21 0116  NA 140 142 140  140 142 141 141  K 5.2* 4.5 4.7  4.7 4.6 4.1 4.1  CL 99 101  --   --  100 94*  CO2 27 31  --   --  34* 37*  GLUCOSE  196* 79  --   --  101* 187*  BUN 77* 84*  --   --  85* 84*  CREATININE 3.65* 3.70*  --   --  3.84* 3.45*  CALCIUM 8.7* 8.9  --   --  8.8* 8.8*   CBC Recent Labs  Lab 01/17/21 1838 01/18/21 0334 01/18/21 1055 01/18/21 1059 01/19/21 0300 01/20/21 0116  WBC 6.7 5.6  --   --  6.0 5.2  NEUTROABS  --  4.2  --   --  4.4 4.0  HGB 10.6* 9.7* 10.2*  10.5* 10.2* 9.3* 9.1*  HCT 33.1* 31.1* 30.0*  31.0* 30.0* 29.7* 28.4*  MCV 99.4 99.7  --   --  99.7 99.6  PLT 156 145*  --   --  147* 142*    Medications:    . enoxaparin (LOVENOX) injection  30 mg Subcutaneous Q24H  . insulin aspart  0-5 Units Subcutaneous QHS  . insulin aspart  0-9 Units Subcutaneous TID WC  . isosorbide-hydrALAZINE  2 tablet Oral TID  . metolazone  2.5 mg Oral Daily  . polyethylene glycol  17 g Oral Daily  . sodium chloride flush  3 mL Intravenous Q12H  . sodium chloride flush  3 mL Intravenous Q12H      Madelon Lips, MD 01/20/2021, 11:55 AM

## 2021-01-21 DIAGNOSIS — I5043 Acute on chronic combined systolic (congestive) and diastolic (congestive) heart failure: Secondary | ICD-10-CM | POA: Diagnosis not present

## 2021-01-21 DIAGNOSIS — N179 Acute kidney failure, unspecified: Secondary | ICD-10-CM | POA: Diagnosis not present

## 2021-01-21 LAB — CBC WITH DIFFERENTIAL/PLATELET
Abs Immature Granulocytes: 0.02 10*3/uL (ref 0.00–0.07)
Basophils Absolute: 0 10*3/uL (ref 0.0–0.1)
Basophils Relative: 0 %
Eosinophils Absolute: 0.1 10*3/uL (ref 0.0–0.5)
Eosinophils Relative: 2 %
HCT: 28.1 % — ABNORMAL LOW (ref 36.0–46.0)
Hemoglobin: 9 g/dL — ABNORMAL LOW (ref 12.0–15.0)
Immature Granulocytes: 0 %
Lymphocytes Relative: 10 %
Lymphs Abs: 0.6 10*3/uL — ABNORMAL LOW (ref 0.7–4.0)
MCH: 31.6 pg (ref 26.0–34.0)
MCHC: 32 g/dL (ref 30.0–36.0)
MCV: 98.6 fL (ref 80.0–100.0)
Monocytes Absolute: 0.7 10*3/uL (ref 0.1–1.0)
Monocytes Relative: 11 %
Neutro Abs: 4.8 10*3/uL (ref 1.7–7.7)
Neutrophils Relative %: 77 %
Platelets: 138 10*3/uL — ABNORMAL LOW (ref 150–400)
RBC: 2.85 MIL/uL — ABNORMAL LOW (ref 3.87–5.11)
RDW: 13.1 % (ref 11.5–15.5)
WBC: 6.3 10*3/uL (ref 4.0–10.5)
nRBC: 0 % (ref 0.0–0.2)

## 2021-01-21 LAB — BASIC METABOLIC PANEL
Anion gap: 7 (ref 5–15)
BUN: 83 mg/dL — ABNORMAL HIGH (ref 8–23)
CO2: 38 mmol/L — ABNORMAL HIGH (ref 22–32)
Calcium: 8.7 mg/dL — ABNORMAL LOW (ref 8.9–10.3)
Chloride: 92 mmol/L — ABNORMAL LOW (ref 98–111)
Creatinine, Ser: 3.8 mg/dL — ABNORMAL HIGH (ref 0.44–1.00)
GFR, Estimated: 13 mL/min — ABNORMAL LOW (ref >60)
Glucose, Bld: 161 mg/dL — ABNORMAL HIGH (ref 70–99)
Potassium: 3.8 mmol/L (ref 3.5–5.1)
Sodium: 137 mmol/L (ref 135–145)

## 2021-01-21 LAB — GLUCOSE, CAPILLARY
Glucose-Capillary: 123 mg/dL — ABNORMAL HIGH (ref 70–99)
Glucose-Capillary: 140 mg/dL — ABNORMAL HIGH (ref 70–99)
Glucose-Capillary: 134 mg/dL — ABNORMAL HIGH (ref 70–99)
Glucose-Capillary: 196 mg/dL — ABNORMAL HIGH (ref 70–99)

## 2021-01-21 MED ORDER — FUROSEMIDE 10 MG/ML IJ SOLN
120.0000 mg | Freq: Two times a day (BID) | INTRAVENOUS | Status: DC
  Administered 2021-01-21 – 2021-01-23 (×5): 120 mg via INTRAVENOUS
  Filled 2021-01-21 (×2): qty 12
  Filled 2021-01-21: qty 4
  Filled 2021-01-21: qty 12
  Filled 2021-01-21: qty 4
  Filled 2021-01-21 (×2): qty 12

## 2021-01-21 MED ORDER — ISOSORB DINITRATE-HYDRALAZINE 20-37.5 MG PO TABS
1.0000 | ORAL_TABLET | Freq: Three times a day (TID) | ORAL | Status: DC
  Administered 2021-01-21 – 2021-01-27 (×19): 1 via ORAL
  Filled 2021-01-21 (×21): qty 1

## 2021-01-21 NOTE — Progress Notes (Addendum)
Patient ID: Beverly Martin, female   DOB: 1956/06/11, 65 y.o.   MRN: [id number]    Advanced Heart Failure Team Rounding Note   PCP:  Billie Ruddy, MD  PCP-Cardiology: Mertie Moores, MD     Reason for Admission: Acute on Chronic Combined Systolic and Diastolic CHF   HPI:    Remains on milrinone 0.125 mcg/kg/min, getting Lasix 120 mg IV bid with metolazone 2.5   Weight down another 6lbs, down 14lbs so far.  Creatinine  3.84 -> 3.45 -> 3.80.  Feels like her breathing is getting better.  Denies chest pain, PND.  Does endorse some dizziness this morning.     Meds:     . enoxaparin (LOVENOX) injection  30 mg Subcutaneous Q24H  . insulin aspart  0-5 Units Subcutaneous QHS  . insulin aspart  0-9 Units Subcutaneous TID WC  . isosorbide-hydrALAZINE  2 tablet Oral TID  . metolazone  2.5 mg Oral Daily  . polyethylene glycol  17 g Oral Daily  . sodium chloride flush  3 mL Intravenous Q12H  . sodium chloride flush  3 mL Intravenous Q12H   . sodium chloride 250 mL (01/21/21 0543)  . sodium chloride    . furosemide Stopped (01/20/21 1848)  . milrinone 0.125 mcg/kg/min (01/21/21 0543)      Objective:    Vital Signs:   Temp:  [97.4 F (36.3 C)-98.5 F (36.9 C)] 98.5 F (36.9 C) (04/11 0534) Pulse Rate:  [87-100] 99 (04/11 0534) Resp:  [16-19] 19 (04/11 0534) BP: (112-153)/(62-72) 135/70 (04/11 0534) SpO2:  [92 %-99 %] 92 % (04/11 0534) Weight:  [57.3 kg] 57.3 kg (04/11 0534) Last BM Date: 01/19/21 Filed Weights   01/19/21 0609 01/20/21 0429 01/21/21 0534  Weight: 63.5 kg 60 kg 57.3 kg     Physical Exam     Cardiac: +JVD, normal rate and rhythm, clear s1 and s2, no murmurs, rubs or gallops, no LE edema Pulmonary: bibasilar rales, upper lung fields now clear, not in distress Abdominal: non distended abdomen, soft and nontender Psych: Alert, conversant, in good spirits    Telemetry   NSR 90s Personally reviewed  Labs     Basic Metabolic Panel: Recent Labs  Lab  01/17/21 1006 01/18/21 0334 01/18/21 1055 01/18/21 1059 01/19/21 0300 01/20/21 0116 01/21/21 0220  NA 140 142 140  140 142 141 141 137  K 5.2* 4.5 4.7  4.7 4.6 4.1 4.1 3.8  CL 99 101  --   --  100 94* 92*  CO2 27 31  --   --  34* 37* 38*  GLUCOSE 196* 79  --   --  101* 187* 161*  BUN 77* 84*  --   --  85* 84* 83*  CREATININE 3.65* 3.70*  --   --  3.84* 3.45* 3.80*  CALCIUM 8.7* 8.9  --   --  8.8* 8.8* 8.7*  MG  --   --   --   --  2.8*  --   --     Liver Function Tests: Recent Labs  Lab 01/18/21 0804  AST 14*  ALT 21  ALKPHOS 56  BILITOT 0.7  PROT 6.1*  ALBUMIN 3.5   No results for input(s): LIPASE, AMYLASE in the last 168 hours. No results for input(s): AMMONIA in the last 168 hours.  CBC: Recent Labs  Lab 01/17/21 1838 01/18/21 0334 01/18/21 1055 01/18/21 1059 01/19/21 0300 01/20/21 0116 01/21/21 0220  WBC 6.7 5.6  --   --  6.0 5.2 6.3  NEUTROABS  --  4.2  --   --  4.4 4.0 4.8  HGB 10.6* 9.7* 10.2*  10.5* 10.2* 9.3* 9.1* 9.0*  HCT 33.1* 31.1* 30.0*  31.0* 30.0* 29.7* 28.4* 28.1*  MCV 99.4 99.7  --   --  99.7 99.6 98.6  PLT 156 145*  --   --  147* 142* 138*    Cardiac Enzymes: No results for input(s): CKTOTAL, CKMB, CKMBINDEX, TROPONINI in the last 168 hours.  BNP: BNP (last 3 results) Recent Labs    07/11/20 0321 01/03/21 1016 01/17/21 1006  BNP 1,575* >4,500.0* >4,500.0*    ProBNP (last 3 results) Recent Labs    12/06/20 0927  PROBNP >5000.0*     CBG: Recent Labs  Lab 01/19/21 2037 01/20/21 0739 01/20/21 1111 01/20/21 1610 01/20/21 2142  GLUCAP 169* 96 297* 103* 179*    Coagulation Studies: Recent Labs    01/18/21 0804  LABPROT 14.0  INR 1.1    Imaging: No results found.    Assessment/Plan   1. Acute on Chronic Combined Systolic and Diastolic CHF: - Likely mixed ischemic and NICM, poorly controlled HTN, poor medication adherence - EF down from 45-50% in 2019 to now 20-25% - NYHA Class III symptoms, markedly  overloaded on admission.  - Volume status improving, I think we are getting close.  Will continue milrinone and IV diuresis for today but hold metolazone, repeat REDS clip - GDMT limited by renal function, no bb with severely low EF and decompensation  - Options very limited with advanced CKD. Not candidate for advanced HF therapies or HD. Hopefully can diurese and stabilize her with milrinone and IV lasix.   2. T2DM: - was very poorly controlled until renal function worsened  - not a candidate for SGLT2i with renal fx and hx of urosepsis - SSI sensitive/renal  3. HTN: - endorsing dizziness this morning, bp okay but will drop bidil to 1 tab TID  4. AKI on CKD4: - Has not improved like we had hoped with diuresis and milrinone support cr 3.1>3.6>3.7>3.87> 3.45>3.8 - likely a combination of hypertensive and diabetic nephropathy,now a component of cardiorenal syndrome - monitor renal fx with diuresis, continue milrinone support - nephrology following (thanks) - Poor HD candidate.    Katherine Roan, MD 01/21/2021, 7:39 AM  Advanced Heart Failure Team Pager [phone] (M-F; 7a - 5p)  Please contact [facility] for night-coverage after hours (4p -7a ) and weekends on [email or web address]  Patient seen and examined with the above-signed Advanced Practice Provider and/or Housestaff. I personally reviewed laboratory data, imaging studies and relevant notes. I independently examined the patient and formulated the important aspects of the plan. I have edited the note to reflect any of my changes or salient points. I have personally discussed the plan with the patient and/or family.  She remains on milrinone. Diuresing well and breathing better but remains volume overloaded. Creatinine now starting to climb.  General:  Sitting in chair No resp difficulty HEENT: normal Neck: supple. JVP to jaw  Carotids 2+ bilat; no bruits. No lymphadenopathy or thryomegaly appreciated. Cor: PMI nondisplaced.  Regular rate & rhythm. 2/6 TR. Lungs: clear Abdomen: soft, nontender, nondistended. No hepatosplenomegaly. No bruits or masses. Good bowel sounds. Extremities: no cyanosis, clubbing, rash, 1-2+ edema Neuro: alert & orientedx3, cranial nerves grossly intact. moves all 4 extremities w/o difficulty. Affect pleasant  Difficult situation volume status improving but renal function trending back up. Will continue attempts at diuresis. If creatinine continues  to climb will need to discuss with Renal if patient is a candidate for trial of HD. Given severe biventricular dysfunction I suspect she would have a hard time tolerating iHD for long. Wil cut back Bidil to 1 tab tid to allow for more renal perfusion. With CABG > 15 years ago I suspect she has severe CAD and would warrant cath if she becomes HD dependent.   Glori Bickers, MD  9:40 PM

## 2021-01-21 NOTE — Progress Notes (Signed)
Attempted to have patient ambulate without O2 through nasal cannula. Pt O2 saturation decreased to 76%. Pt recovered with use of 2L of O2 nasal cannula. Pt saturation increased to 98%. Pt does not have to use O2 at home. Will initiate incentive spirometer.

## 2021-01-21 NOTE — Progress Notes (Signed)
[facility] Progress Note     Assessment/ Plan:   urosepsis 2021. Has had uptrend to 3.70 since then. I am hoping some can be reversed by restoring euvolemia but longstanding DM (A1c 11.6 in XX123456) and HTN have certainly taken their toll. UA, UP/C bland with 1.12 g proteinuria.  SPEP/ free light chains pending. Renal US 05/2020 with echogenicity. Agree with high-dose Lasix and metolazone. No indication for RRT at present but will be following closely. Some pressures on the lower side- may need to temporarily reduce BiDil to allow BP to come up slightly so we don't induce ATN with aggressive diuresis. - Cr 3.8--> 3.5 -> 3.8  Today  - Still has e/o volume overload and CHF team stopping the metolazone today but continuing the Lasix. Down net neg 7237 ml/ hospitalization.  HCO3 rising -> Unna boots to help mobilize the LE fluid. Unable to give diamox bec of compromised kidney function.  - If renal function continues trending in the wrong direction tomorrow then would consider holding the diuretics.   2. Acute systolic CHF exacerbation: per AHF. EF 20-25% on TTE 12/2020. On Lasix, metolazone, and milrinone -> metolazone stopped 4/11.   3. DM II: recent A1c under control in the 6s. Per primary  4. Dispo: admitted  Subjective:   Robust UOP continues, cr up to 3.8. Down to 1L O2.    Objective:   BP 135/70 (BP Location: Left Arm)   Pulse 99   Temp 98.5 F (36.9 C) (Oral)   Resp 19   Ht '5\' 4"'$  (1.626 m)   Wt 57.3 kg   SpO2 92%   BMI 21.70 kg/m   Intake/Output Summary (Last 24 hours) at 01/21/2021 1305 Last data filed at 01/21/2021 1100 Gross per 24 hour  Intake 763.82 ml  Output 2850 ml  Net -2086.18 ml   Weight change: -2.631 kg  Physical Exam: GEN: NAD, sitting in chair NECK ++ JVD PULM faint bibasilar crackles, largely unchanged CV RRR ABD mildly bloated EXT 1+ edema, improving NEURO AAO x 3 nonfocal  Imaging: No results  found.  Labs: BMET Recent Labs  Lab 01/17/21 1006 01/18/21 0334 01/18/21 1055 01/18/21 1059 01/19/21 0300 01/20/21 0116 01/21/21 0220  NA 140 142 140  140 142 141 141 137  K 5.2* 4.5 4.7  4.7 4.6 4.1 4.1 3.8  CL 99 101  --   --  100 94* 92*  CO2 27 31  --   --  34* 37* 38*  GLUCOSE 196* 79  --   --  101* 187* 161*  BUN 77* 84*  --   --  85* 84* 83*  CREATININE 3.65* 3.70*  --   --  3.84* 3.45* 3.80*  CALCIUM 8.7* 8.9  --   --  8.8* 8.8* 8.7*   CBC Recent Labs  Lab 01/18/21 0334 01/18/21 1055 01/18/21 1059 01/19/21 0300 01/20/21 0116 01/21/21 0220  WBC 5.6  --   --  6.0 5.2 6.3  NEUTROABS 4.2  --   --  4.4 4.0 4.8  HGB 9.7*   < > 10.2* 9.3* 9.1* 9.0*  HCT 31.1*   < > 30.0* 29.7* 28.4* 28.1*  MCV 99.7  --   --  99.7 99.6 98.6  PLT 145*  --   --  147* 142* 138*   < > = values in this interval not displayed.    Medications:    . enoxaparin (LOVENOX) injection  30 mg Subcutaneous Q24H  . insulin aspart  0-5 Units Subcutaneous QHS  . insulin aspart  0-9 Units Subcutaneous TID WC  . isosorbide-hydrALAZINE  1 tablet Oral TID  . polyethylene glycol  17 g Oral Daily  . sodium chloride flush  3 mL Intravenous Q12H  . sodium chloride flush  3 mL Intravenous Q12H      Otelia Santee, MD 01/21/2021, 1:05 PM

## 2021-01-21 NOTE — Progress Notes (Signed)
Orthopedic Tech Progress Note Patient Details:  Beverly Martin 1955-10-30 [id number]  Ortho Devices Type of Ortho Device: Louretta Parma boot Ortho Device/Splint Location: BLE Ortho Device/Splint Interventions: Ordered,Application   Post Interventions Patient Tolerated: Well Instructions Provided: Care of device   [name] 01/21/2021, 3:16 PM

## 2021-01-21 NOTE — Plan of Care (Signed)

## 2021-01-21 NOTE — Progress Notes (Signed)
This chaplain responded to the MD consult for spiritual care.  The Pt. is sitting up in the bedside recliner, alert, and smiling when the chaplain visits. The Pt. shares she just finished talking to her sister. The chaplain understands the Pt. is well connected to family, faith, and community.  The Pt. faith allows her to remain hopeful amidst any transitions in her health. The Pt. contagious spirituality is busy lifting up the people around her.  Prayer is accepted by the Pt. along with F/U spiritual care as needed.

## 2021-01-21 NOTE — Progress Notes (Signed)
Heart Failure Nurse Navigator Progress Note   ReDS Vest / Clip - 01/21/21 1000      ReDS Vest / Clip   Station Marker A    Ruler Value 24    ReDS Value Range High volume overload    ReDS Actual Value 48    Anatomical Comments weight 57.3kg          Pt sitting in recliner with B feet on the ground. Pt on 1LPM via .   Pricilla Holm, RN, BSN Heart Failure Nurse Navigator [phone]

## 2021-01-22 DIAGNOSIS — I5043 Acute on chronic combined systolic (congestive) and diastolic (congestive) heart failure: Secondary | ICD-10-CM | POA: Diagnosis not present

## 2021-01-22 DIAGNOSIS — N179 Acute kidney failure, unspecified: Secondary | ICD-10-CM | POA: Diagnosis not present

## 2021-01-22 LAB — CBC WITH DIFFERENTIAL/PLATELET
Abs Immature Granulocytes: 0.03 10*3/uL (ref 0.00–0.07)
Basophils Absolute: 0 10*3/uL (ref 0.0–0.1)
Basophils Relative: 0 %
Eosinophils Absolute: 0.1 10*3/uL (ref 0.0–0.5)
Eosinophils Relative: 2 %
HCT: 28.5 % — ABNORMAL LOW (ref 36.0–46.0)
Hemoglobin: 9.2 g/dL — ABNORMAL LOW (ref 12.0–15.0)
Immature Granulocytes: 1 %
Lymphocytes Relative: 11 %
Lymphs Abs: 0.6 10*3/uL — ABNORMAL LOW (ref 0.7–4.0)
MCH: 31.9 pg (ref 26.0–34.0)
MCHC: 32.3 g/dL (ref 30.0–36.0)
MCV: 99 fL (ref 80.0–100.0)
Monocytes Absolute: 0.7 10*3/uL (ref 0.1–1.0)
Monocytes Relative: 13 %
Neutro Abs: 3.9 10*3/uL (ref 1.7–7.7)
Neutrophils Relative %: 73 %
Platelets: 125 10*3/uL — ABNORMAL LOW (ref 150–400)
RBC: 2.88 MIL/uL — ABNORMAL LOW (ref 3.87–5.11)
RDW: 13 % (ref 11.5–15.5)
WBC: 5.3 10*3/uL (ref 4.0–10.5)
nRBC: 0 % (ref 0.0–0.2)

## 2021-01-22 LAB — GLUCOSE, CAPILLARY
Glucose-Capillary: 135 mg/dL — ABNORMAL HIGH (ref 70–99)
Glucose-Capillary: 239 mg/dL — ABNORMAL HIGH (ref 70–99)
Glucose-Capillary: 185 mg/dL — ABNORMAL HIGH (ref 70–99)
Glucose-Capillary: 180 mg/dL — ABNORMAL HIGH (ref 70–99)

## 2021-01-22 LAB — BASIC METABOLIC PANEL
Anion gap: 10 (ref 5–15)
BUN: 74 mg/dL — ABNORMAL HIGH (ref 8–23)
CO2: 39 mmol/L — ABNORMAL HIGH (ref 22–32)
Calcium: 9 mg/dL (ref 8.9–10.3)
Chloride: 89 mmol/L — ABNORMAL LOW (ref 98–111)
Creatinine, Ser: 3.39 mg/dL — ABNORMAL HIGH (ref 0.44–1.00)
GFR, Estimated: 14 mL/min — ABNORMAL LOW (ref >60)
Glucose, Bld: 128 mg/dL — ABNORMAL HIGH (ref 70–99)
Potassium: 3.6 mmol/L (ref 3.5–5.1)
Sodium: 138 mmol/L (ref 135–145)

## 2021-01-22 LAB — PROTEIN ELECTROPHORESIS, SERUM
A/G Ratio: 1.5 (ref 0.7–1.7)
Albumin ELP: 3.4 g/dL (ref 2.9–4.4)
Alpha-1-Globulin: 0.2 g/dL (ref 0.0–0.4)
Alpha-2-Globulin: 0.5 g/dL (ref 0.4–1.0)
Beta Globulin: 0.8 g/dL (ref 0.7–1.3)
Gamma Globulin: 0.7 g/dL (ref 0.4–1.8)
Globulin, Total: 2.3 g/dL (ref 2.2–3.9)
Total Protein ELP: 5.7 g/dL — ABNORMAL LOW (ref 6.0–8.5)

## 2021-01-22 MED ORDER — ATORVASTATIN CALCIUM 40 MG PO TABS
40.0000 mg | ORAL_TABLET | Freq: Every day | ORAL | Status: DC
  Administered 2021-01-22 – 2021-01-27 (×6): 40 mg via ORAL
  Filled 2021-01-22 (×6): qty 1

## 2021-01-22 MED ORDER — ASPIRIN EC 81 MG PO TBEC
81.0000 mg | DELAYED_RELEASE_TABLET | Freq: Every day | ORAL | Status: DC
  Administered 2021-01-22 – 2021-01-27 (×6): 81 mg via ORAL
  Filled 2021-01-22 (×6): qty 1

## 2021-01-22 MED ORDER — POTASSIUM CHLORIDE CRYS ER 20 MEQ PO TBCR
20.0000 meq | EXTENDED_RELEASE_TABLET | Freq: Once | ORAL | Status: AC
  Administered 2021-01-22: 20 meq via ORAL
  Filled 2021-01-22: qty 1

## 2021-01-22 MED ORDER — METOLAZONE 5 MG PO TABS
2.5000 mg | ORAL_TABLET | Freq: Every day | ORAL | Status: DC
  Administered 2021-01-22 – 2021-01-23 (×2): 2.5 mg via ORAL
  Filled 2021-01-22 (×3): qty 1

## 2021-01-22 NOTE — Plan of Care (Signed)

## 2021-01-22 NOTE — Progress Notes (Addendum)
Patient ID: Beverly Martin, female   DOB: 26-Dec-1955, 65 y.o.   MRN: [id number]    Advanced Heart Failure Team Rounding Note   PCP:  Billie Ruddy, MD  PCP-Cardiology: Mertie Moores, MD     Reason for Admission: Acute on Chronic Combined Systolic and Diastolic CHF   HPI:    Remains on milrinone 0.125 mcg/kg/min, getting Lasix 120 mg IV bid Did not get metoalzone  Weight up a pound overnight. Creatinine  3.84 -> 3.45 -> 3.80 -> 3.39  Feels breathing continues to improve, Denies orthopnea, PND or CP.    Meds:     . enoxaparin (LOVENOX) injection  30 mg Subcutaneous Q24H  . insulin aspart  0-5 Units Subcutaneous QHS  . insulin aspart  0-9 Units Subcutaneous TID WC  . isosorbide-hydrALAZINE  1 tablet Oral TID  . polyethylene glycol  17 g Oral Daily  . sodium chloride flush  3 mL Intravenous Q12H  . sodium chloride flush  3 mL Intravenous Q12H   . sodium chloride 8 mL/hr at 01/21/21 2336  . sodium chloride Stopped (01/21/21 2108)  . furosemide Stopped (01/21/21 2232)  . milrinone 0.125 mcg/kg/min (01/21/21 2336)      Objective:    Vital Signs:   Temp:  [98 F (36.7 C)-98.6 F (37 C)] 98.6 F (37 C) (04/12 0455) Pulse Rate:  [87-91] 90 (04/12 0455) Resp:  [16-20] 16 (04/12 0455) BP: (126-148)/(65-73) 133/65 (04/12 0455) SpO2:  [98 %-100 %] 98 % (04/12 0455) Weight:  [57.6 kg] 57.6 kg (04/12 0500) Last BM Date: 01/19/21 Filed Weights   01/20/21 0429 01/21/21 0534 01/22/21 0500  Weight: 60 kg 57.3 kg 57.6 kg     Physical Exam     General:  Sitting up in bed. No resp difficulty HEENT: normal Neck: supple. JVP to jaw Carotids 2+ bilat; no bruits. No lymphadenopathy or thryomegaly appreciated. Cor: PMI nondisplaced. Regular rate & rhythm.+ s3 Lungs: clear dull right base Abdomen: soft, nontender, nondistended. No hepatosplenomegaly. No bruits or masses. Good bowel sounds. Extremities: no cyanosis, clubbing, rash, 1-2+ edema + UNNA Neuro: alert & orientedx3,  cranial nerves grossly intact. moves all 4 extremities w/o difficulty. Affect pleasant   Telemetry   NSR 90-100 Personally reviewed  Labs     Basic Metabolic Panel: Recent Labs  Lab 01/18/21 0334 01/18/21 1055 01/18/21 1059 01/19/21 0300 01/20/21 0116 01/21/21 0220 01/22/21 0412  NA 142   < > 142 141 141 137 138  K 4.5   < > 4.6 4.1 4.1 3.8 3.6  CL 101  --   --  100 94* 92* 89*  CO2 31  --   --  34* 37* 38* 39*  GLUCOSE 79  --   --  101* 187* 161* 128*  BUN 84*  --   --  85* 84* 83* 74*  CREATININE 3.70*  --   --  3.84* 3.45* 3.80* 3.39*  CALCIUM 8.9  --   --  8.8* 8.8* 8.7* 9.0  MG  --   --   --  2.8*  --   --   --    < > = values in this interval not displayed.    Liver Function Tests: Recent Labs  Lab 01/18/21 0804  AST 14*  ALT 21  ALKPHOS 56  BILITOT 0.7  PROT 6.1*  ALBUMIN 3.5   No results for input(s): LIPASE, AMYLASE in the last 168 hours. No results for input(s): AMMONIA in the last 168 hours.  CBC: Recent Labs  Lab 01/18/21 0334 01/18/21 1055 01/18/21 1059 01/19/21 0300 01/20/21 0116 01/21/21 0220 01/22/21 0412  WBC 5.6  --   --  6.0 5.2 6.3 5.3  NEUTROABS 4.2  --   --  4.4 4.0 4.8 3.9  HGB 9.7*   < > 10.2* 9.3* 9.1* 9.0* 9.2*  HCT 31.1*   < > 30.0* 29.7* 28.4* 28.1* 28.5*  MCV 99.7  --   --  99.7 99.6 98.6 99.0  PLT 145*  --   --  147* 142* 138* 125*   < > = values in this interval not displayed.    Cardiac Enzymes: No results for input(s): CKTOTAL, CKMB, CKMBINDEX, TROPONINI in the last 168 hours.  BNP: BNP (last 3 results) Recent Labs    07/11/20 0321 01/03/21 1016 01/17/21 1006  BNP 1,575* >4,500.0* >4,500.0*    ProBNP (last 3 results) Recent Labs    12/06/20 0927  PROBNP >5000.0*     CBG: Recent Labs  Lab 01/20/21 2142 01/21/21 0756 01/21/21 1202 01/21/21 1650 01/21/21 2115  GLUCAP 179* 123* 140* 134* 196*    Coagulation Studies: No results for input(s): LABPROT, INR in the last 72 hours.  Imaging: No  results found.    Assessment/Plan   1. Acute on Chronic Combined Systolic and Diastolic CHF: - Likely mixed ischemic and NICM, poorly controlled HTN, poor medication adherence - EF down from 45-50% in 2019 to now 20-25% - Markedly overloaded on admission.  - Volume status improving but plateaued overnight after metolazone held with bump in creatinine - Still volume overlooded. Continue IV lasix. Add back metolazone today - GDMT limited by renal function, no bb with severely low EF and decompensation  - Bidil cut back to 1 tab tid to help keep BP up  - Difficult situation renal function remains very tenuous. Still volume overloaded. Will continue attempts at diuresis. If creatinine continues to climb will need to discuss with Renal if patient is a candidate for trial of HD. Given severe biventricular dysfunction I suspect she would have a hard time tolerating iHD for long. With CABG > 15 years ago I suspect she has severe CAD and would warrant cath if she becomes HD dependent.   2. T2DM: - was very poorly controlled until renal function worsened  - not a candidate for SGLT2i with renal fx and hx of urosepsis - SSI sensitive/renal  3. HTN: - Bidil cut back   4. AKI on CKD4: - Has not improved like we had hoped with diuresis and milrinone support cr 3.1>3.6>3.7>3.87> 3.45>3.8> 3.39 - likely a combination of hypertensive and diabetic nephropathy,now a component of cardiorenal syndrome - monitor renal fx with diuresis, continue milrinone support - nephrology following (thanks) - Plan as above   Glori Bickers, MD 01/22/2021, 6:36 AM  Advanced Heart Failure Team Pager [phone] (M-F; 7a - 5p)  Please contact [facility] for night-coverage after hours (4p -7a ) and weekends on [email or web address]

## 2021-01-22 NOTE — Progress Notes (Signed)
[facility] Progress Note     Assessment/ Plan:   1. AKI on CKD IV: 1.11 in 2019 and seems to have had a progression since then starting with urosepsis 2021. Has had uptrend to 3.70 since then. I am hoping some can be reversed by restoring euvolemia but longstanding DM (A1c 11.6 in XX123456) and HTN have certainly taken their toll. UA, UP/C bland with 1.12 g proteinuria. SPEP/ free light chains pending. Renal US 05/2020 with echogenicity. Agree with high-dose Lasix and metolazone. No indication for RRT at present but will be following closely. Some pressures on the lower side- may need to temporarily reduce BiDil to allow BP to come up slightly so we don't induce ATN with aggressive diuresis.  HCO3 rising -> Unna boots to help mobilize the LE fluid. Unable to give diamox bec of compromised kidney function.   - Cr3.8--> 3.5 -> 3.8 -> 3.4  Today  - Still has e/o volume overload and CHF team stopped the metolazone 4/11  but continuing the Lasix. Metolazone restarted 4/12 given pt actually gained a lb over the past 24hrs.  Certainly volume overloaded and I had a discussion with the patient about giving a trial of HD if this becomes necessary. Hopefully with continued diuresis the renal function remains stable avoiding RRT. But the pateint is willing to give a trial of iHD for a short course if renal function declines to help optimize cardiac function. I'm not convinced she will tolerate iHD.   2. Acute systolic CHF exacerbation: per AHF. EF 20-25% on TTE 12/2020. On Lasix, metolazone, and milrinone -> metolazone stopped 4/11 and restarted 4/12.   3. DM II: recent A1c under control in the 6s. Per primary  4. Dispo: admitted  Subjective:   Decent UOP continues, cr 3.4.  Down to 1L O2.   Objective:   BP 139/71 (BP Location: Left Arm)   Pulse 93   Temp 98.4 F (36.9 C) (Oral)   Resp 18   Ht '5\' 4"'$  (1.626 m)   Wt 57.6 kg   SpO2 98%   BMI 21.80 kg/m    Intake/Output Summary (Last 24 hours) at 01/22/2021 1138 Last data filed at 01/22/2021 1000 Gross per 24 hour  Intake 1341.12 ml  Output 2175 ml  Net -833.88 ml   Weight change: 0.272 kg  Physical Exam: GEN: NAD, sitting in chair NECK ++ JVD PULM faint bibasilar crackles, largely unchanged CV RRR ABD mildly bloated EXT 1+edema, improving, UNNA boots NEURO AAO x 3 nonfocal  Imaging: No results found.  Labs: BMET Recent Labs  Lab 01/17/21 1006 01/18/21 0334 01/18/21 1055 01/18/21 1059 01/19/21 0300 01/20/21 0116 01/21/21 0220 01/22/21 0412  NA 140 142 140  140 142 141 141 137 138  K 5.2* 4.5 4.7  4.7 4.6 4.1 4.1 3.8 3.6  CL 99 101  --   --  100 94* 92* 89*  CO2 27 31  --   --  34* 37* 38* 39*  GLUCOSE 196* 79  --   --  101* 187* 161* 128*  BUN 77* 84*  --   --  85* 84* 83* 74*  CREATININE 3.65* 3.70*  --   --  3.84* 3.45* 3.80* 3.39*  CALCIUM 8.7* 8.9  --   --  8.8* 8.8* 8.7* 9.0   CBC Recent Labs  Lab 01/19/21 0300 01/20/21 0116 01/21/21 0220 01/22/21 0412  WBC 6.0 5.2 6.3 5.3  NEUTROABS 4.4 4.0 4.8 3.9  HGB 9.3* 9.1* 9.0* 9.2*  HCT 29.7* 28.4* 28.1* 28.5*  MCV 99.7 99.6 98.6 99.0  PLT 147* 142* 138* 125*    Medications:    . aspirin EC  81 mg Oral Daily  . atorvastatin  40 mg Oral Daily  . enoxaparin (LOVENOX) injection  30 mg Subcutaneous Q24H  . insulin aspart  0-5 Units Subcutaneous QHS  . insulin aspart  0-9 Units Subcutaneous TID WC  . isosorbide-hydrALAZINE  1 tablet Oral TID  . metolazone  2.5 mg Oral Daily  . polyethylene glycol  17 g Oral Daily  . potassium chloride  20 mEq Oral Once  . sodium chloride flush  3 mL Intravenous Q12H  . sodium chloride flush  3 mL Intravenous Q12H      Otelia Santee, MD 01/22/2021, 11:38 AM

## 2021-01-22 NOTE — TOC Progression Note (Addendum)
Transition of Care (TOC) - Progression Note  Heart Failure  Patient Details  Name: Beverly Martin MRN: [id number] Date of Birth: [birth year]  Transition of Care [facility]) CM/SW [place], [place] Phone Number: 01/22/2021, 2:19 PM  Clinical Narrative:    CSW received a message from the doctor this morning about working on disability for [name]. CSW spoke with another social worker, Ebony Hail due to [employer] being at a conference to help get started on this. The other CSW, Ebony Hail spoke with the patient about talking with her employer about disability and getting the needed paperwork from the employer faxed or emailed to [place] so the patients provider can help with the disability paperwork. CSW will continue to follow up with this tomorrow.  CSW will continue to follow for d/c needs.     Barriers to Discharge: Continued Medical Work up  Expected Discharge Plan and Services   In-house Referral: Clinical Social Work Discharge Planning Services: CM Consult   Living arrangements for the past 2 months: Apartment                                       Social Determinants of Health (SDOH) Interventions    Readmission Risk Interventions No flowsheet data found.  Maurissa Ambrose, MSW, [place] Heart Failure Social Worker

## 2021-01-22 NOTE — Social Work (Signed)
CSW spoke with patient at bedside. Patient requested assist with short term disability. Patient gave CSW her supervisors number. CSW called her supervisor who gave a telephone number for patient to call to file a claim. CSW provided information to patient. Patients spouse was at bedside. CSW explained to patient that her employer said the first step to process is to call number given and  to file a claim.Patient confirmed she will file the claim. CSW let patient know that [place] CSW will be back tomorrow and can assist with next step after claim is made. CSW will continue to follow.

## 2021-01-23 LAB — CBC
HCT: 30.1 % — ABNORMAL LOW (ref 36.0–46.0)
Hemoglobin: 9.5 g/dL — ABNORMAL LOW (ref 12.0–15.0)
MCH: 30.8 pg (ref 26.0–34.0)
MCHC: 31.6 g/dL (ref 30.0–36.0)
MCV: 97.7 fL (ref 80.0–100.0)
Platelets: 132 10*3/uL — ABNORMAL LOW (ref 150–400)
RBC: 3.08 MIL/uL — ABNORMAL LOW (ref 3.87–5.11)
RDW: 12.6 % (ref 11.5–15.5)
WBC: 6 10*3/uL (ref 4.0–10.5)
nRBC: 0 % (ref 0.0–0.2)

## 2021-01-23 LAB — BASIC METABOLIC PANEL
Anion gap: 11 (ref 5–15)
BUN: 76 mg/dL — ABNORMAL HIGH (ref 8–23)
CO2: 40 mmol/L — ABNORMAL HIGH (ref 22–32)
Calcium: 9.1 mg/dL (ref 8.9–10.3)
Chloride: 86 mmol/L — ABNORMAL LOW (ref 98–111)
Creatinine, Ser: 3.36 mg/dL — ABNORMAL HIGH (ref 0.44–1.00)
GFR, Estimated: 15 mL/min — ABNORMAL LOW (ref >60)
Glucose, Bld: 130 mg/dL — ABNORMAL HIGH (ref 70–99)
Potassium: 3.9 mmol/L (ref 3.5–5.1)
Sodium: 137 mmol/L (ref 135–145)

## 2021-01-23 LAB — KAPPA/LAMBDA LIGHT CHAINS
Kappa free light chain: 87.1 mg/L — ABNORMAL HIGH (ref 3.3–19.4)
Kappa, lambda light chain ratio: 2.24 — ABNORMAL HIGH (ref 0.26–1.65)
Lambda free light chains: 38.9 mg/L — ABNORMAL HIGH (ref 5.7–26.3)

## 2021-01-23 LAB — GLUCOSE, CAPILLARY
Glucose-Capillary: 117 mg/dL — ABNORMAL HIGH (ref 70–99)
Glucose-Capillary: 211 mg/dL — ABNORMAL HIGH (ref 70–99)
Glucose-Capillary: 107 mg/dL — ABNORMAL HIGH (ref 70–99)
Glucose-Capillary: 199 mg/dL — ABNORMAL HIGH (ref 70–99)

## 2021-01-23 LAB — MAGNESIUM: Magnesium: 2.3 mg/dL (ref 1.7–2.4)

## 2021-01-23 MED ORDER — POTASSIUM CHLORIDE CRYS ER 20 MEQ PO TBCR
20.0000 meq | EXTENDED_RELEASE_TABLET | Freq: Once | ORAL | Status: AC
  Administered 2021-01-23: 20 meq via ORAL

## 2021-01-23 NOTE — TOC Progression Note (Addendum)
Transition of Care (TOC) - Progression Note  Heart Failure   Patient Details  Name: Beverly Martin MRN: [id number] Date of Birth: 1956/03/22  Transition of Care [facility]) CM/SW [place], [place] Phone Number: 01/23/2021, 4:53 PM  Clinical Narrative:    CSW spoke with the patient at bedside and completed very brief SDOH screening with the patient who reported needing help with disability. CSW followed up with patient regarding disability and will send over referral to the [facility] and CSW obtained the patients signature for the referral. CSW provided the patient with the social workers name, number and position and if she identify's other needs to please reach out so that CSW can provide support.  CSW sent over disability referral to [facility].  CSW will continue to follow throughout discharge.    Barriers to Discharge: Continued Medical Work up  Expected Discharge Plan and Services   In-house Referral: Armed forces logistics/support/administrative officer Discharge Planning Services: CM Consult   Living arrangements for the past 2 months: Single Family Home                                       Social Determinants of Health (SDOH) Interventions    Readmission Risk Interventions No flowsheet data found.  [name], MSW, [place] Heart Failure Social Worker

## 2021-01-23 NOTE — Progress Notes (Signed)
[facility] Progress Note     Assessment/ Plan:   1. AKI on CKD IV: 1.11 in 2019 and seems to have had a progression since then starting with urosepsis 2021. Has had uptrend to 3.70 since then. I am hoping some can be reversed by restoring euvolemia but longstanding DM (A1c 11.6 in [id number]) and HTN have certainly taken their toll. UA, UP/C bland with 1.12 g proteinuria. SPEP/ free light chains pending. Renal US 05/2020 with echogenicity. Agree with high-dose Lasix and metolazone. No indication for RRT at present but will be following closely. Some pressures on the lower side- may need to temporarily reduce BiDil to allow BP to come up slightly so we don't induce ATN with aggressive diuresis.  HCO3 rising -> Unna boots to help mobilize the LE fluid. Unable to give diamox bec of compromised kidney function.   - Cr3.8--> 3.5 -> 3.8 -> 3.4  -> 3.36 Today  - Still has e/o volume overload and CHF team stopped the metolazone 4/11 and then restarted following day + high dose  Lasix.   Certainly volume overloaded and I had a discussion with the patient about giving a trial of HD if this becomes necessary. Hopefully with continued diuresis the renal function remains stable avoiding RRT. But the pateint is willing to give a trial of iHD for a short course if renal function declines to help optimize cardiac function. Fortunately renal function remains fairly stable and she is still diuresing.  Per I&O charting overall net neg 11L during this hospitalization   2. Acute systolic CHF exacerbation: per AHF. EF 20-25% on TTE 12/2020. On Lasix, metolazone, and milrinone -> metolazone stopped 4/11 and restarted 4/12.   3. DM II: recent A1c under control in the 6s. Per primary  4. Dispo: admitted  Subjective:   Decent UOP continues, cr 3.4.  Down to 1L O2.   Objective:   BP (!) 137/57 (BP Location: Left Arm)   Pulse 97   Temp 98 F (36.7 C) (Oral)   Resp 18   Ht '5\' 4"'$   (1.626 m)   Wt 56.3 kg   SpO2 96%   BMI 21.30 kg/m   Intake/Output Summary (Last 24 hours) at 01/23/2021 1308 Last data filed at 01/23/2021 1200 Gross per 24 hour  Intake 960.27 ml  Output 4200 ml  Net -3239.73 ml   Weight change: -1.315 kg  Physical Exam: GEN: NAD, sitting in chair NECK ++ JVD PULM faint bibasilar crackles, largely unchanged CV RRR ABD mildly bloated EXT 1+edema, improving, UNNA boots NEURO AAO x 3 nonfocal  Imaging: No results found.  Labs: BMET Recent Labs  Lab 01/17/21 1006 01/18/21 0334 01/18/21 1055 01/18/21 1059 01/19/21 0300 01/20/21 0116 01/21/21 0220 01/22/21 0412 01/23/21 0253  NA 140 142 140  140 142 141 141 137 138 137  K 5.2* 4.5 4.7  4.7 4.6 4.1 4.1 3.8 3.6 3.9  CL 99 101  --   --  100 94* 92* 89* 86*  CO2 27 31  --   --  34* 37* 38* 39* 40*  GLUCOSE 196* 79  --   --  101* 187* 161* 128* 130*  BUN 77* 84*  --   --  85* 84* 83* 74* 76*  CREATININE 3.65* 3.70*  --   --  3.84* 3.45* 3.80* 3.39* 3.36*  CALCIUM 8.7* 8.9  --   --  8.8* 8.8* 8.7* 9.0 9.1   CBC Recent Labs  Lab 01/19/21 0300 01/20/21 0116 01/21/21  [id number] 01/22/21 0412 01/23/21 0253  WBC 6.0 5.2 6.3 5.3 6.0  NEUTROABS 4.4 4.0 4.8 3.9  --   HGB 9.3* 9.1* 9.0* 9.2* 9.5*  HCT 29.7* 28.4* 28.1* 28.5* 30.1*  MCV 99.7 99.6 98.6 99.0 97.7  PLT 147* 142* 138* 125* 132*    Medications:    . aspirin EC  81 mg Oral Daily  . atorvastatin  40 mg Oral Daily  . enoxaparin (LOVENOX) injection  30 mg Subcutaneous Q24H  . insulin aspart  0-5 Units Subcutaneous QHS  . insulin aspart  0-9 Units Subcutaneous TID WC  . isosorbide-hydrALAZINE  1 tablet Oral TID  . metolazone  2.5 mg Oral Daily  . polyethylene glycol  17 g Oral Daily  . sodium chloride flush  3 mL Intravenous Q12H  . sodium chloride flush  3 mL Intravenous Q12H      Otelia Santee, MD 01/23/2021, 1:08 PM

## 2021-01-23 NOTE — Progress Notes (Addendum)
Patient ID: Beverly Martin, female   DOB: Apr 13, 1956, 65 y.o.   MRN: [id number]    Advanced Heart Failure Team Rounding Note   PCP:  Billie Ruddy, MD  PCP-Cardiology: Mertie Moores, MD     Reason for Admission: Acute on Chronic Combined Systolic and Diastolic CHF   HPI:    Remains on milrinone 0.125 mcg/kg/min, getting Lasix 120 mg IV bid and metolazone   Creatinine  3.84 -> 3.45 -> 3.80 -> 3.39->3.36  Weight down 3lbs overnight  down 16lbs so far.  Feels breathing continues to improve, walking around without getting short of breath. Denies orthopnea, PND or CP.    Meds:     . aspirin EC  81 mg Oral Daily  . atorvastatin  40 mg Oral Daily  . enoxaparin (LOVENOX) injection  30 mg Subcutaneous Q24H  . insulin aspart  0-5 Units Subcutaneous QHS  . insulin aspart  0-9 Units Subcutaneous TID WC  . isosorbide-hydrALAZINE  1 tablet Oral TID  . metolazone  2.5 mg Oral Daily  . polyethylene glycol  17 g Oral Daily  . sodium chloride flush  3 mL Intravenous Q12H  . sodium chloride flush  3 mL Intravenous Q12H   . sodium chloride 8 mL/hr at 01/22/21 1607  . sodium chloride Stopped (01/21/21 2108)  . furosemide 120 mg (01/22/21 1728)  . milrinone 0.125 mcg/kg/min (01/22/21 1607)      Objective:    Vital Signs:   Temp:  [98 F (36.7 C)-98.4 F (36.9 C)] 98 F (36.7 C) (04/13 0407) Pulse Rate:  [87-93] 87 (04/13 0407) Resp:  [16-20] 18 (04/13 0407) BP: (126-150)/(60-86) 142/68 (04/13 0407) SpO2:  [98 %-100 %] 100 % (04/13 0407) Weight:  [56.3 kg] 56.3 kg (04/13 0407) Last BM Date: 01/20/21 Filed Weights   01/21/21 0534 01/22/21 0500 01/23/21 0407  Weight: 57.3 kg 57.6 kg 56.3 kg     Physical Exam     Cardiac: JVD 12, normal rate and rhythm, clear s1 and s2, no murmurs, rubs or gallops, lower extremities in unna boots Pulmonary: improved aeration of lungs but still rales bilaterally and decreased bs at bases, not in distress Abdominal: non distended abdomen, soft  and nontender Psych: Alert, conversant, in good spirits    Telemetry   NSR 90-100 Personally reviewed  Labs     Basic Metabolic Panel: Recent Labs  Lab 01/19/21 0300 01/20/21 0116 01/21/21 0220 01/22/21 0412 01/23/21 0253  NA 141 141 137 138 137  K 4.1 4.1 3.8 3.6 3.9  CL 100 94* 92* 89* 86*  CO2 34* 37* 38* 39* 40*  GLUCOSE 101* 187* 161* 128* 130*  BUN 85* 84* 83* 74* 76*  CREATININE 3.84* 3.45* 3.80* 3.39* 3.36*  CALCIUM 8.8* 8.8* 8.7* 9.0 9.1  MG 2.8*  --   --   --  2.3    Liver Function Tests: Recent Labs  Lab 01/18/21 0804  AST 14*  ALT 21  ALKPHOS 56  BILITOT 0.7  PROT 6.1*  ALBUMIN 3.5   No results for input(s): LIPASE, AMYLASE in the last 168 hours. No results for input(s): AMMONIA in the last 168 hours.  CBC: Recent Labs  Lab 01/18/21 0334 01/18/21 1055 01/19/21 0300 01/20/21 0116 01/21/21 0220 01/22/21 0412 01/23/21 0253  WBC 5.6  --  6.0 5.2 6.3 5.3 6.0  NEUTROABS 4.2  --  4.4 4.0 4.8 3.9  --   HGB 9.7*   < > 9.3* 9.1* 9.0* 9.2* 9.5*  HCT  31.1*   < > 29.7* 28.4* 28.1* 28.5* 30.1*  MCV 99.7  --  99.7 99.6 98.6 99.0 97.7  PLT 145*  --  147* 142* 138* 125* 132*   < > = values in this interval not displayed.    Cardiac Enzymes: No results for input(s): CKTOTAL, CKMB, CKMBINDEX, TROPONINI in the last 168 hours.  BNP: BNP (last 3 results) Recent Labs    07/11/20 0321 01/03/21 1016 01/17/21 1006  BNP 1,575* >4,500.0* >4,500.0*    ProBNP (last 3 results) Recent Labs    12/06/20 0927  PROBNP >5000.0*     CBG: Recent Labs  Lab 01/22/21 0716 01/22/21 1155 01/22/21 1640 01/22/21 2053 01/23/21 0628  GLUCAP 135* 239* 185* 180* 117*    Coagulation Studies: No results for input(s): LABPROT, INR in the last 72 hours.  Imaging: No results found.    Assessment/Plan   1. Acute on Chronic Combined Systolic and Diastolic CHF: - Likely mixed ischemic and NICM, poorly controlled HTN, poor medication adherence - EF down  from 45-50% in 2019 to now 20-25% - Markedly overloaded on admission.  - Volume status improving with diuresis - Still volume overloaded cr stable with diuresis. Continue IV lasix and metolazone - GDMT limited by renal function, no bb with severely low EF and decompensation  - Bidil cut back to 1 tab tid and systolic bp has been 99991111  - Difficult situation renal function remains very tenuous. Still volume overloaded. Nephrology has discussed with patient if needed they will give her a trial of HD which I agree with. Unfortunately with severe biventricular dysfunction I suspect she would have a hard time tolerating iHD for long. With CABG > 15 years ago I suspect she has severe CAD and would warrant cath if she becomes HD dependent.  -will order PT  2. T2DM: - was very poorly controlled until renal function worsened  - not a candidate for SGLT2i with renal fx and hx of urosepsis - SSI sensitive/renal  3. HTN: - Bidil cut back and systolic bp has been 99991111  4. AKI on CKD4: - Renal function labile with diuresis and milrinone support but seems to have stabilized somewhat over the last two days.  cr 3.1>3.6>3.7>3.87> 3.45>3.8> 3.39>3.36 - likely a combination of hypertensive and diabetic nephropathy,now a component of cardiorenal syndrome - monitor renal fx with diuresis, continue milrinone support - nephrology following (thanks) - Plan as above   Katherine Roan, MD 01/23/2021, 7:44 AM  Advanced Heart Failure Team Pager [phone] (M-F; 7a - 5p)  Please contact [facility] for night-coverage after hours (4p -7a ) and weekends on [email or web address]   Patient seen and examined with the above-signed Advanced Practice Provider and/or Housestaff. I personally reviewed laboratory data, imaging studies and relevant notes. I independently examined the patient and formulated the important aspects of the plan. I have edited the note to reflect any of my changes or salient points. I have personally  discussed the plan with the patient and/or family.  Remains on IV lasix an milrinone. Continues to diurese. Weight down 16 pounds total. Creatinine stable at 3.3. Breathing better. No orthopnea or PND  General:  Sitting in chair No resp difficulty HEENT: normal Neck: supple JVP 8-9 Carotids 2+ bilat; no bruits. No lymphadenopathy or thryomegaly appreciated. Cor: PMI nondisplaced. Regular rate & rhythm. No rubs, gallops or murmurs. Lungs: clear Abdomen: soft, nontender, nondistended. No hepatosplenomegaly. No bruits or masses. Good bowel sounds. Extremities: no cyanosis, clubbing, rash, 1+  edema Neuro:  alert & orientedx3, cranial nerves grossly intact. moves all 4 extremities w/o difficulty. Affect pleasant  Volume status improved but still seems a bit overloaded. Renal function stable. Will diurese at least one more day with milrinone support (unless Renal feels otherwise). Once fully diuresed will begin milrinone wean and witch to oral torsemide.   Glori Bickers, MD  8:56 AM

## 2021-01-24 LAB — BASIC METABOLIC PANEL
Anion gap: 9 (ref 5–15)
BUN: 75 mg/dL — ABNORMAL HIGH (ref 8–23)
CO2: 44 mmol/L — ABNORMAL HIGH (ref 22–32)
Calcium: 9.1 mg/dL (ref 8.9–10.3)
Chloride: 84 mmol/L — ABNORMAL LOW (ref 98–111)
Creatinine, Ser: 3.21 mg/dL — ABNORMAL HIGH (ref 0.44–1.00)
GFR, Estimated: 15 mL/min — ABNORMAL LOW (ref >60)
Glucose, Bld: 140 mg/dL — ABNORMAL HIGH (ref 70–99)
Potassium: 3.9 mmol/L (ref 3.5–5.1)
Sodium: 137 mmol/L (ref 135–145)

## 2021-01-24 LAB — GLUCOSE, CAPILLARY
Glucose-Capillary: 132 mg/dL — ABNORMAL HIGH (ref 70–99)
Glucose-Capillary: 232 mg/dL — ABNORMAL HIGH (ref 70–99)
Glucose-Capillary: 278 mg/dL — ABNORMAL HIGH (ref 70–99)
Glucose-Capillary: 131 mg/dL — ABNORMAL HIGH (ref 70–99)

## 2021-01-24 MED ORDER — TORSEMIDE 20 MG PO TABS
60.0000 mg | ORAL_TABLET | Freq: Two times a day (BID) | ORAL | Status: DC
  Administered 2021-01-24 – 2021-01-25 (×2): 60 mg via ORAL
  Filled 2021-01-24 (×2): qty 3

## 2021-01-24 MED ORDER — POTASSIUM CHLORIDE CRYS ER 20 MEQ PO TBCR
20.0000 meq | EXTENDED_RELEASE_TABLET | Freq: Once | ORAL | Status: AC
  Administered 2021-01-24: 20 meq via ORAL
  Filled 2021-01-24: qty 1

## 2021-01-24 MED ORDER — TORSEMIDE 20 MG PO TABS: 40.0000 mg | ORAL_TABLET | Freq: Two times a day (BID) | ORAL | Status: DC

## 2021-01-24 MED ORDER — ACETAZOLAMIDE 250 MG PO TABS
250.0000 mg | ORAL_TABLET | Freq: Two times a day (BID) | ORAL | Status: AC
  Administered 2021-01-24 (×2): 250 mg via ORAL
  Filled 2021-01-24 (×2): qty 1

## 2021-01-24 MED ORDER — MILRINONE LACTATE IN DEXTROSE 20-5 MG/100ML-% IV SOLN: 0.1250 ug/kg/min | INTRAVENOUS | Status: DC

## 2021-01-24 NOTE — Progress Notes (Signed)
Patient ID: Beverly Martin, female   DOB: November 30, 1955, 65 y.o.   MRN: [id number]    Advanced Heart Failure Team Rounding Note   PCP:  Billie Ruddy, MD  PCP-Cardiology: Mertie Moores, MD     Reason for Admission: Acute on Chronic Combined Systolic and Diastolic CHF   HPI:    Remains on milrinone 0.125 mcg/kg/min, getting Lasix 120 mg IV bid and metolazone   Creatinine  3.84 -> 3.45 -> 3.80 -> 3.39->3.36-> 3.21  Weight exactly the same? Net neg 2L. Breathing has improved but not much of a noticeable difference compared to yesterday.  Denies chest pain or orthopnea   Meds:     . aspirin EC  81 mg Oral Daily  . atorvastatin  40 mg Oral Daily  . enoxaparin (LOVENOX) injection  30 mg Subcutaneous Q24H  . insulin aspart  0-5 Units Subcutaneous QHS  . insulin aspart  0-9 Units Subcutaneous TID WC  . isosorbide-hydrALAZINE  1 tablet Oral TID  . metolazone  2.5 mg Oral Daily  . polyethylene glycol  17 g Oral Daily  . sodium chloride flush  3 mL Intravenous Q12H  . sodium chloride flush  3 mL Intravenous Q12H   . sodium chloride 8 mL/hr at 01/22/21 1607  . sodium chloride Stopped (01/21/21 2108)  . furosemide 120 mg (01/23/21 1821)  . milrinone 0.125 mcg/kg/min (01/24/21 0250)      Objective:    Vital Signs:   Temp:  [97.9 F (36.6 C)-98.1 F (36.7 C)] 98.1 F (36.7 C) (04/13 2019) Pulse Rate:  [89-97] 89 (04/13 2019) Resp:  [15-16] 15 (04/13 2019) BP: (137-148)/(57-71) 141/71 (04/13 2019) SpO2:  [96 %-100 %] 100 % (04/13 2019) Weight:  [56.3 kg] 56.3 kg (04/14 0500) Last BM Date: 01/23/21 Filed Weights   01/22/21 0500 01/23/21 0407 01/24/21 0500  Weight: 57.6 kg 56.3 kg 56.3 kg     Physical Exam     Cardiac: JVD 8-9, normal rate and rhythm, clear s1 and s2, 2/6 systolic murmur, no rubs or gallops, lower extremities in unna boots Pulmonary: rales bilaterally and decreased bs at bases, not in distress Abdominal: non distended abdomen, soft and nontender Psych:  Alert, conversant, in good spirits    Telemetry   NSR 90-100 Personally reviewed  Labs     Basic Metabolic Panel: Recent Labs  Lab 01/19/21 0300 01/20/21 0116 01/21/21 0220 01/22/21 0412 01/23/21 0253 01/24/21 0329  NA 141 141 137 138 137 137  K 4.1 4.1 3.8 3.6 3.9 3.9  CL 100 94* 92* 89* 86* 84*  CO2 34* 37* 38* 39* 40* 44*  GLUCOSE 101* 187* 161* 128* 130* 140*  BUN 85* 84* 83* 74* 76* 75*  CREATININE 3.84* 3.45* 3.80* 3.39* 3.36* 3.21*  CALCIUM 8.8* 8.8* 8.7* 9.0 9.1 9.1  MG 2.8*  --   --   --  2.3  --     Liver Function Tests: Recent Labs  Lab 01/18/21 0804  AST 14*  ALT 21  ALKPHOS 56  BILITOT 0.7  PROT 6.1*  ALBUMIN 3.5   No results for input(s): LIPASE, AMYLASE in the last 168 hours. No results for input(s): AMMONIA in the last 168 hours.  CBC: Recent Labs  Lab 01/18/21 0334 01/18/21 1055 01/19/21 0300 01/20/21 0116 01/21/21 0220 01/22/21 0412 01/23/21 0253  WBC 5.6  --  6.0 5.2 6.3 5.3 6.0  NEUTROABS 4.2  --  4.4 4.0 4.8 3.9  --   HGB 9.7*   < >  9.3* 9.1* 9.0* 9.2* 9.5*  HCT 31.1*   < > 29.7* 28.4* 28.1* 28.5* 30.1*  MCV 99.7  --  99.7 99.6 98.6 99.0 97.7  PLT 145*  --  147* 142* 138* 125* 132*   < > = values in this interval not displayed.    Cardiac Enzymes: No results for input(s): CKTOTAL, CKMB, CKMBINDEX, TROPONINI in the last 168 hours.  BNP: BNP (last 3 results) Recent Labs    07/11/20 0321 01/03/21 1016 01/17/21 1006  BNP 1,575* >4,500.0* >4,500.0*    ProBNP (last 3 results) Recent Labs    12/06/20 0927  PROBNP >5000.0*     CBG: Recent Labs  Lab 01/22/21 2053 01/23/21 0628 01/23/21 1209 01/23/21 1645 01/23/21 2023  GLUCAP 180* 117* 211* 107* 199*    Coagulation Studies: No results for input(s): LABPROT, INR in the last 72 hours.  Imaging: No results found.    Assessment/Plan   1. Acute on Chronic Combined Systolic and Diastolic CHF: - Likely mixed ischemic and NICM, poorly controlled HTN, poor  medication adherence - EF down from 45-50% in 2019 to now 20-25% - Markedly overloaded on admission.  - Volume status improving with diuresis - Remains volume overloaded, I think she could potentially tolerate one more day of diuresis before transitioning  - GDMT limited by renal function, no bb with severely low EF and decompensation  - Bidil cut back to 1 tab tid and systolic bp has been 99991111  - renal function remains very tenuous. Still volume overloaded but much improved. Nephrology has discussed with patient if needed they will give her a trial of HD if needed which I agree with. Unfortunately with severe biventricular dysfunction I suspect she would have a hard time tolerating iHD for long. With CABG > 15 years ago I suspect she has severe CAD and would warrant cath if she becomes HD dependent.   2. T2DM: - was very poorly controlled until renal function worsened  - not a candidate for SGLT2i with renal fx and hx of urosepsis - SSI sensitive/renal  3. HTN: - Bidil cut back and systolic bp has been 99991111  4. AKI on CKD4: - Renal function labile with diuresis and milrinone support but seems to have stabilized somewhat over the last two days.  cr 3.1>3.6>3.7>3.87> 3.45>3.8> 3.39>3.36>3.21 - likely a combination of hypertensive and diabetic nephropathy,now a component of cardiorenal syndrome - monitor renal fx with diuresis, continue milrinone support - nephrology following (thanks) - Plan as above   Katherine Roan, MD 01/24/2021, 7:46 AM  Advanced Heart Failure Team Pager [phone] (M-F; 7a - 5p)  Please contact [facility] for night-coverage after hours (4p -7a ) and weekends on [email or web address]  Patient seen and examined with the above-signed Advanced Practice Provider and/or Housestaff. I personally reviewed laboratory data, imaging studies and relevant notes. I independently examined the patient and formulated the important aspects of the plan. I have edited the note to  reflect any of my changes or salient points. I have personally discussed the plan with the patient and/or family.  Remains on high-dose lasix and metolazone.. Diuresis has plateaued. Alkalosis getting worse.   General:  Sitting in chair No resp difficulty HEENT: normal Neck: supple. JVP to jaw  Carotids 2+ bilat; no bruits. No lymphadenopathy or thryomegaly appreciated. Cor: PMI nondisplaced. Regular rate & rhythm. No rubs, gallops or murmurs. Lungs: clear Abdomen: soft, nontender, nondistended. No hepatosplenomegaly. No bruits or masses. Good bowel sounds. Extremities: no cyanosis, clubbing, rash, 1+ edema  Neuro: alert & orientedx3, cranial nerves grossly intact. moves all 4 extremities w/o difficulty. Affect pleasant  I think she may be as good as we can get with IV diuretics. D/w Renal. Will continue milrinone today. Switch to torsemide 60 bid. Will give 2 doses of diamox to help with alkalosis.   If stable can wean milrinone tomorrow.   Glori Bickers, MD  12:18 PM

## 2021-01-24 NOTE — Evaluation (Signed)
Physical Therapy Evaluation Patient Details Name: Beverly Martin MRN: [id number] DOB: 1956-03-08 Today's Date: 01/24/2021   History of Present Illness  65 y.o. female presents to [facility] ED on 01/17/2021 with concern for continued volume overload, SOB. Pt underwent R heart cath on 4/8. Pt with tenuous renal function during admission. PMH includes hypertension, diabetes type 2, CAD, CABG, chronic bilateral pedal edema.  Clinical Impression  Pt on supplemental oxygen 2L/min prior to start of session with oxygen sats at high 90's. Pt demonstrates ability to independently perform transfers as well as ambulate distances greater than 1000 feet without reports of shortness of breath. Pt weaned to RA during ambulation with apparent desat to mid-low 80's. Pt tolerated ambulation at 1L/min well, with no reports of SOB and oxygen sats remaining around 94-95%. Pt demonstrates capacity for functional independence and is limited by oxygen saturation levels both at rest and with activity. Pt will benefit from acute PT to improve oxygen saturation levels when ambulating distances > 1000 feet.     Follow Up Recommendations No PT follow up    Equipment Recommendations  None recommended by PT    Recommendations for Other Services       Precautions / Restrictions Precautions Precautions: None Restrictions Weight Bearing Restrictions: No      Mobility  Bed Mobility                    Transfers Overall transfer level: Independent Equipment used: None             General transfer comment: Pt was able to independently perform sit > stand, not requiring cues.  Ambulation/Gait Ambulation/Gait assistance: Independent Gait Distance (Feet): 1000 Feet (Pt completed 4 laps, length of hall > 1000 feet) Assistive device: None Gait Pattern/deviations: WFL(Within Functional Limits) Gait velocity: functional Gait velocity interpretation: >2.62 ft/sec, indicative of community ambulatory General Gait  Details: Pt denies SOB during gait, but desaturatred to mid-low 80's when weaned from oxygen requiring oxygen to be increased to 2L/min. Pt weaned down to 1L/min remaining at 94-96%.  Stairs            Wheelchair Mobility    Modified Rankin (Stroke Patients Only)       Balance Overall balance assessment: Independent                                           Pertinent Vitals/Pain Pain Assessment: No/denies pain    Home Living Family/patient expects to be discharged to:: Private residence Living Arrangements: Spouse/significant other Available Help at Discharge: Family;Available PRN/intermittently Type of Home: Other(Comment) (townhouse) Home Access: Level entry     Home Layout: One level Home Equipment: None      Prior Function Level of Independence: Independent         Comments: Pt had returned to work following prior hospital admission.     Hand Dominance        Extremity/Trunk Assessment   Upper Extremity Assessment Upper Extremity Assessment: Overall WFL for tasks assessed    Lower Extremity Assessment Lower Extremity Assessment: Overall WFL for tasks assessed       Communication   Communication: No difficulties  Cognition Arousal/Alertness: Awake/alert Behavior During Therapy: WFL for tasks assessed/performed Overall Cognitive Status: Within Functional Limits for tasks assessed  General Comments General comments (skin integrity, edema, etc.): Pt tolerates ambulation well, denying SOB even when oxygen levels showed desaturation. Pt completed 4 laps total in hall.    Exercises     Assessment/Plan    PT Assessment Patent does not need any further PT services  PT Problem List         PT Treatment Interventions      PT Goals (Current goals can be found in the Care Plan section)  Acute Rehab PT Goals Patient Stated Goal: To go home. PT Goal Formulation: With  patient Time For Goal Achievement: 02/07/21 Potential to Achieve Goals: Good Additional Goals Additional Goal #1: Pt will ambulate a distance > 1000 feet on RA with DOE < 2/4    Frequency     Barriers to discharge        Co-evaluation               AM-PAC PT "6 Clicks" Mobility  Outcome Measure Help needed turning from your back to your side while in a flat bed without using bedrails?: None Help needed moving from lying on your back to sitting on the side of a flat bed without using bedrails?: None Help needed moving to and from a bed to a chair (including a wheelchair)?: None Help needed standing up from a chair using your arms (e.g., wheelchair or bedside chair)?: None Help needed to walk in hospital room?: None Help needed climbing 3-5 steps with a railing? : None 6 Click Score: 24    End of Session Equipment Utilized During Treatment: Gait belt;Oxygen Activity Tolerance: Patient tolerated treatment well Patient left: in chair;with call bell/phone within reach Nurse Communication: Mobility status PT Visit Diagnosis: Other (comment) (Cardiopulmonary deficitrs, oxygen deficits)    Time: [id number] PT Time Calculation (min) (ACUTE ONLY): 23 min   Charges:   PT Evaluation $PT Eval Low Complexity: 1 Low          Acute Rehab  Pager: [phone]   Garwin Brothers, SPT  01/24/2021, 12:14 PM

## 2021-01-24 NOTE — Progress Notes (Signed)
[facility] Progress Note     Assessment/ Plan:   1. AKI on CKD IV: 1.11 in 2019 and seems to have had a progression since then starting with urosepsis 2021. Has had uptrend to 3.70 since then. I am hoping some can be reversed by restoring euvolemia but longstanding DM (A1c 11.6 in XX123456) and HTN have certainly taken their toll. UA, UP/C bland with 1.12 g proteinuria. SPEP/ free light chains pending. Renal US 05/2020 with echogenicity. Agree with high-dose Lasix and metolazone. No indication for RRT at present but will be following closely. Some pressures on the lower side- may need to temporarily reduce BiDil to allow BP to come up slightly so we don't induce ATN with aggressive diuresis.  HCO3 rising -> Unna boots to help mobilize the LE fluid. Unable to give diamox bec of compromised kidney function.   - Cr3.8--> 3.5 -> 3.8 -> 3.4  -> 3.21 Today  - I have had a discussion with the patient about giving a trial of HD if this becomes necessary. Renal function has remained stable but developing worsening metabolic alkalosis. Unable to give Acetazolamide because of her CKD.  Per I&O charting overall net neg 13L during this hospitalization but weights decr from 63.7kg to 56.3 kg (standing).  If possible let's convert her to an oral diuretic regimen and consider stopping the milrinone to facilitate ambulation and also hopefully stabilize the metabolic alkalosis. Her breathing is markedly improved.  I would  avoid RRT if possible; certainly would correct the metabolic derangements but RRT may also cause her to develop AKI from hemodynamic instability.   2. Acute systolic CHF exacerbation: per AHF. EF 20-25% on TTE 12/2020. On Lasix, metolazone, and milrinone -> metolazone stopped 4/11 and restarted 4/12.   3. DM II: recent A1c under control in the 6s. Per primary  4. Dispo: admitted  Subjective:   Decent UOP continues, cr to 3.2 and pt denies dyspnea.    Objective:   BP 139/70 (BP Location: Left Arm)   Pulse 91   Temp 97.8 F (36.6 C) (Oral)   Resp 16   Ht '5\' 4"'$  (1.626 m)   Wt 56.3 kg   SpO2 99%   BMI 21.30 kg/m   Intake/Output Summary (Last 24 hours) at 01/24/2021 0955 Last data filed at 01/24/2021 0800 Gross per 24 hour  Intake 540 ml  Output 3200 ml  Net -2660 ml   Weight change: 0.009 kg  Physical Exam: GEN: NAD, sitting in chair NECK +JVD PULM trace  Crackles CV RRR ABD mildly bloated EXT tredema, improving, UNNA boots NEURO AAO x 3 nonfocal  Imaging: No results found.  Labs: BMET Recent Labs  Lab 01/18/21 0334 01/18/21 1055 01/18/21 1059 01/19/21 0300 01/20/21 0116 01/21/21 0220 01/22/21 0412 01/23/21 0253 01/24/21 0329  NA 142   < > 142 141 141 137 138 137 137  K 4.5   < > 4.6 4.1 4.1 3.8 3.6 3.9 3.9  CL 101  --   --  100 94* 92* 89* 86* 84*  CO2 31  --   --  34* 37* 38* 39* 40* 44*  GLUCOSE 79  --   --  101* 187* 161* 128* 130* 140*  BUN 84*  --   --  85* 84* 83* 74* 76* 75*  CREATININE 3.70*  --   --  3.84* 3.45* 3.80* 3.39* 3.36* 3.21*  CALCIUM 8.9  --   --  8.8* 8.8* 8.7* 9.0 9.1 9.1   < > =  values in this interval not displayed.   CBC Recent Labs  Lab 01/19/21 0300 01/20/21 0116 01/21/21 0220 01/22/21 0412 01/23/21 0253  WBC 6.0 5.2 6.3 5.3 6.0  NEUTROABS 4.4 4.0 4.8 3.9  --   HGB 9.3* 9.1* 9.0* 9.2* 9.5*  HCT 29.7* 28.4* 28.1* 28.5* 30.1*  MCV 99.7 99.6 98.6 99.0 97.7  PLT 147* 142* 138* 125* 132*    Medications:    . aspirin EC  81 mg Oral Daily  . atorvastatin  40 mg Oral Daily  . enoxaparin (LOVENOX) injection  30 mg Subcutaneous Q24H  . insulin aspart  0-5 Units Subcutaneous QHS  . insulin aspart  0-9 Units Subcutaneous TID WC  . isosorbide-hydrALAZINE  1 tablet Oral TID  . metolazone  2.5 mg Oral Daily  . polyethylene glycol  17 g Oral Daily  . sodium chloride flush  3 mL Intravenous Q12H  . sodium chloride flush  3 mL Intravenous Q12H      Otelia Santee,  MD 01/24/2021, 9:55 AM

## 2021-01-25 LAB — BASIC METABOLIC PANEL
Anion gap: 8 (ref 5–15)
BUN: 69 mg/dL — ABNORMAL HIGH (ref 8–23)
CO2: 43 mmol/L — ABNORMAL HIGH (ref 22–32)
Calcium: 9.1 mg/dL (ref 8.9–10.3)
Chloride: 84 mmol/L — ABNORMAL LOW (ref 98–111)
Creatinine, Ser: 3.06 mg/dL — ABNORMAL HIGH (ref 0.44–1.00)
GFR, Estimated: 16 mL/min — ABNORMAL LOW (ref >60)
Glucose, Bld: 146 mg/dL — ABNORMAL HIGH (ref 70–99)
Potassium: 4 mmol/L (ref 3.5–5.1)
Sodium: 135 mmol/L (ref 135–145)

## 2021-01-25 LAB — GLUCOSE, CAPILLARY
Glucose-Capillary: 129 mg/dL — ABNORMAL HIGH (ref 70–99)
Glucose-Capillary: 204 mg/dL — ABNORMAL HIGH (ref 70–99)
Glucose-Capillary: 180 mg/dL — ABNORMAL HIGH (ref 70–99)
Glucose-Capillary: 149 mg/dL — ABNORMAL HIGH (ref 70–99)

## 2021-01-25 MED ORDER — TORSEMIDE 20 MG PO TABS
60.0000 mg | ORAL_TABLET | Freq: Every day | ORAL | Status: DC
  Administered 2021-01-26 – 2021-01-27 (×2): 60 mg via ORAL
  Filled 2021-01-25 (×2): qty 3

## 2021-01-25 NOTE — Progress Notes (Signed)
  [facility] Progress Note     Assessment/ Plan:   1. AKI on CKD IV: 1.11 in 2019 with progressive CKD.Marland Kitchen Baseline hard to be definitive about. Longstanding DM (A1c 11.6 in XX123456) and HTN have certainly taken their toll. UA, UP/C bland with 1.12 g proteinuria. SPEP/ free light chains pending. Renal US 05/2020 with echogenicity.  -Creatinine slowly improving down to 3 today -No acute indication for renal replacement therapy at this time -Continue diuresis with torsemide; careful with acetazolamide would not dose greater than 250 mg daily due to possibility of CNS toxicity.  2. Acute systolic CHF exacerbation: per AHF. EF 20-25% on TTE 12/2020. On milrinone and torsemide  3. DM II: recent A1c under control in the 6s. Per primary  4. Dispo: admitted  Subjective:   Good urine output with 1.6 L today.  Creatinine slightly improved to 3.  Patient states that she feels pretty well today   Objective:   BP 135/76 (BP Location: Left Arm)   Pulse 88   Temp 97.9 F (36.6 C) (Oral)   Resp 16   Ht '5\' 4"'$  (1.626 m)   Wt 53.9 kg   SpO2 100%   BMI 20.41 kg/m   Intake/Output Summary (Last 24 hours) at 01/25/2021 1108 Last data filed at 01/25/2021 1025 Gross per 24 hour  Intake 900 ml  Output 1800 ml  Net -900 ml   Weight change:   Physical Exam: GEN: NAD, sitting in chair NECK +JVD PULM bilateral chest rise with no increased work of breathing CV RRR ABD mildly bloated EXT tredema, improving, UNNA boots NEURO AAO x 3 nonfocal  Imaging: No results found.  Labs: BMET Recent Labs  Lab 01/19/21 0300 01/20/21 0116 01/21/21 0220 01/22/21 0412 01/23/21 0253 01/24/21 0329 01/25/21 0339  NA 141 141 137 138 137 137 135  K 4.1 4.1 3.8 3.6 3.9 3.9 4.0  CL 100 94* 92* 89* 86* 84* 84*  CO2 34* 37* 38* 39* 40* 44* 43*  GLUCOSE 101* 187* 161* 128* 130* 140* 146*  BUN 85* 84* 83* 74* 76* 75* 69*  CREATININE 3.84* 3.45* 3.80* 3.39* 3.36* 3.21* 3.06*  CALCIUM  8.8* 8.8* 8.7* 9.0 9.1 9.1 9.1   CBC Recent Labs  Lab 01/19/21 0300 01/20/21 0116 01/21/21 0220 01/22/21 0412 01/23/21 0253  WBC 6.0 5.2 6.3 5.3 6.0  NEUTROABS 4.4 4.0 4.8 3.9  --   HGB 9.3* 9.1* 9.0* 9.2* 9.5*  HCT 29.7* 28.4* 28.1* 28.5* 30.1*  MCV 99.7 99.6 98.6 99.0 97.7  PLT 147* 142* 138* 125* 132*    Medications:    . aspirin EC  81 mg Oral Daily  . atorvastatin  40 mg Oral Daily  . enoxaparin (LOVENOX) injection  30 mg Subcutaneous Q24H  . insulin aspart  0-5 Units Subcutaneous QHS  . insulin aspart  0-9 Units Subcutaneous TID WC  . isosorbide-hydrALAZINE  1 tablet Oral TID  . polyethylene glycol  17 g Oral Daily  . sodium chloride flush  3 mL Intravenous Q12H  . sodium chloride flush  3 mL Intravenous Q12H  . torsemide  60 mg Oral BID      Reesa Chew  01/25/2021, 11:08 AM

## 2021-01-25 NOTE — TOC Progression Note (Addendum)
Transition of Care (TOC) - Progression Note  Heart Failure  Patient Details  Name: Beverly Martin MRN: [id number] Date of Birth: 1955-12-16  Transition of Care [facility]) CM/SW [place], [place] Phone Number: 01/25/2021, 10:52 AM  Clinical Narrative:    CSW spoke with patient at bedside to follow up about disability and inform her the referral was sent to the [facility] on Wednesday and they should reach out to her soon.  [name] reported the MD mentioning to her about possibly going home with oxygen but she isn't sure. CSW will check with provider to set up before discharge if needed. CSW sent a secure chat to Doctor Winfrey who reported that she probably won't need it and they will do another O2 test today to verify.  CSW provided [name] with the [employer]  phone number and address so she could reach out and follow up with them regarding her disability application/referral.  TOC will continue to follow through discharge.      Barriers to Discharge: Continued Medical Work up  Expected Discharge Plan and Services   In-house Referral: Armed forces logistics/support/administrative officer Discharge Planning Services: CM Consult   Living arrangements for the past 2 months: Single Family Home                                       Social Determinants of Health (SDOH) Interventions    Readmission Risk Interventions No flowsheet data found.  Conal Shetley, MSW, [place] Heart Failure Social Worker

## 2021-01-25 NOTE — Progress Notes (Addendum)
Patient ID: Beverly Martin, female   DOB: Jun 11, 1956, 65 y.o.   MRN: [id number]    Advanced Heart Failure Team Rounding Note   PCP:  Billie Ruddy, MD  PCP-Cardiology: Mertie Moores, MD     Reason for Admission: Acute on Chronic Combined Systolic and Diastolic CHF   HPI:    Remains on milrinone 0.125 mcg/kg/min switched to torsemide 60 bid and diamox 250bid yesterday  Creatinine  3.84 -> 3.45 -> 3.80 -> 3.39->3.36-> 3.21->3.06  Weight down 5lbs, brisk diuresis with torsemide and diamox.  Cr continues to improve.  Did well with PT yesterday not short of breath with ambulation, did require minimal supplemental O2 with exertion.     Meds:     . aspirin EC  81 mg Oral Daily  . atorvastatin  40 mg Oral Daily  . enoxaparin (LOVENOX) injection  30 mg Subcutaneous Q24H  . insulin aspart  0-5 Units Subcutaneous QHS  . insulin aspart  0-9 Units Subcutaneous TID WC  . isosorbide-hydrALAZINE  1 tablet Oral TID  . polyethylene glycol  17 g Oral Daily  . sodium chloride flush  3 mL Intravenous Q12H  . sodium chloride flush  3 mL Intravenous Q12H  . torsemide  60 mg Oral BID   . sodium chloride 8 mL/hr at 01/22/21 1607  . sodium chloride Stopped (01/21/21 2108)  . milrinone 0.125 mcg/kg/min (01/24/21 1347)      Objective:    Vital Signs:   Temp:  [97.8 F (36.6 C)-98 F (36.7 C)] 98 F (36.7 C) (04/15 0030) Pulse Rate:  [89-94] 90 (04/15 0030) Resp:  [16-17] 17 (04/15 0030) BP: (130-148)/(67-78) 138/70 (04/15 0030) SpO2:  [97 %-100 %] 97 % (04/15 0030) Last BM Date: 01/23/21 Filed Weights   01/22/21 0500 01/23/21 0407 01/24/21 0500  Weight: 57.6 kg 56.3 kg 56.3 kg     Physical Exam     Cardiac: JVD 5-6, normal rate and rhythm, clear s1 and s2, 2/6 systolic murmur, no rubs or gallops, lower extremities in unna boots Pulmonary: lungs now clear, not in distress Abdominal: non distended abdomen, soft and nontender Psych: Alert, conversant, in good  spirits    Telemetry   NSR 90-100 Personally reviewed  Labs     Basic Metabolic Panel: Recent Labs  Lab 01/19/21 0300 01/20/21 0116 01/21/21 0220 01/22/21 0412 01/23/21 0253 01/24/21 0329 01/25/21 0339  NA 141   < > 137 138 137 137 135  K 4.1   < > 3.8 3.6 3.9 3.9 4.0  CL 100   < > 92* 89* 86* 84* 84*  CO2 34*   < > 38* 39* 40* 44* 43*  GLUCOSE 101*   < > 161* 128* 130* 140* 146*  BUN 85*   < > 83* 74* 76* 75* 69*  CREATININE 3.84*   < > 3.80* 3.39* 3.36* 3.21* 3.06*  CALCIUM 8.8*   < > 8.7* 9.0 9.1 9.1 9.1  MG 2.8*  --   --   --  2.3  --   --    < > = values in this interval not displayed.    Liver Function Tests: Recent Labs  Lab 01/18/21 0804  AST 14*  ALT 21  ALKPHOS 56  BILITOT 0.7  PROT 6.1*  ALBUMIN 3.5   No results for input(s): LIPASE, AMYLASE in the last 168 hours. No results for input(s): AMMONIA in the last 168 hours.  CBC: Recent Labs  Lab 01/19/21 0300 01/20/21 0116 01/21/21 0220  01/22/21 0412 01/23/21 0253  WBC 6.0 5.2 6.3 5.3 6.0  NEUTROABS 4.4 4.0 4.8 3.9  --   HGB 9.3* 9.1* 9.0* 9.2* 9.5*  HCT 29.7* 28.4* 28.1* 28.5* 30.1*  MCV 99.7 99.6 98.6 99.0 97.7  PLT 147* 142* 138* 125* 132*    Cardiac Enzymes: No results for input(s): CKTOTAL, CKMB, CKMBINDEX, TROPONINI in the last 168 hours.  BNP: BNP (last 3 results) Recent Labs    07/11/20 0321 01/03/21 1016 01/17/21 1006  BNP 1,575* >4,500.0* >4,500.0*    ProBNP (last 3 results) Recent Labs    12/06/20 0927  PROBNP >5000.0*     CBG: Recent Labs  Lab 01/23/21 2023 01/24/21 0747 01/24/21 1120 01/24/21 1650 01/24/21 2106  GLUCAP 199* 132* 232* 278* 131*    Coagulation Studies: No results for input(s): LABPROT, INR in the last 72 hours.  Imaging: No results found.    Assessment/Plan   1. Acute on Chronic Combined Systolic and Diastolic CHF: - Likely mixed ischemic and NICM, poorly controlled HTN, poor medication adherence - EF down from 45-50% in 2019  to now 20-25% - Markedly overloaded on admission.  - Volume status improving with diuresis weight down 22lbs since admission - 01/24/2021 switched to torsemide 60 bid also received one time diamox '250mg'$  bid, remains on milrinone support will wean off today - continue torsemide may need to adjust dose down a bit will watch it - GDMT limited by renal function, no bb with severely low EF and decompensation  - Bidil cut back to 1 tab tid and systolic bp has been >130 may need to go back up to 2 tabs as we turn milrinone off  - Renal function seems to be stabilizing and hopefully will not need HD. With severe biventricular dysfunction I suspect she would have a hard time tolerating iHD for long. With CABG > 15 years ago I suspect she has severe CAD and would warrant cath if she becomes HD dependent.  -will walk her with pulse ox today also, likely won't require oxygen today based on her examination  2. T2DM: - was very poorly controlled until renal function worsened  - not a candidate for SGLT2i with renal fx and hx of urosepsis - SSI sensitive/renal  3. HTN: - Bidil cut back and systolic bp has been 99991111, may need to go back up to 2 tabs as we turn milrinone off   4. AKI on CKD4: - Renal function labile initially with diuresis and milrinone support but seems to have started to trend in the right direction  cr 3.1>3.6>3.7>3.87> 3.45>3.8> 3.39>3.36>3.21>3.06 - likely a combination of hypertensive and diabetic nephropathy,now a component of cardiorenal syndrome - monitor renal fx with diuresis, continue milrinone support - nephrology following (thanks) - Plan as above   Katherine Roan, MD 01/25/2021, 7:37 AM  Advanced Heart Failure Team Pager [phone] (M-F; 7a - 5p)  Please contact [facility] for night-coverage after hours (4p -7a ) and weekends on [email or web address]    Patient seen and examined with the above-signed Advanced Practice Provider and/or Housestaff. I personally reviewed  laboratory data, imaging studies and relevant notes. I independently examined the patient and formulated the important aspects of the plan. I have edited the note to reflect any of my changes or salient points. I have personally discussed the plan with the patient and/or family.  Remains on milrinone. Diuresed well with torsemide and diamox. Weight down another 6 pounds. Creatinine improved some. Denies orthopnea or PND  General:  Sitting  in chair  No resp difficulty HEENT: normal Neck: supple.JVP 8-9. Carotids 2+ bilat; no bruits. No lymphadenopathy or thryomegaly appreciated. Cor: PMI nondisplaced. Regular rate & rhythm. No rubs, gallops or murmurs. Lungs: clear Abdomen: soft, nontender, nondistended. No hepatosplenomegaly. No bruits or masses. Good bowel sounds. Extremities: no cyanosis, clubbing, rash, tr-1+ edema Neuro: alert & orientedx3, cranial nerves grossly intact. moves all 4 extremities w/o difficulty. Affect pleasant  Much improved on current regimen. Will stop milrinone today. Cut torsemide to 60 daily. Continue diamox 250 daily.   Glori Bickers, MD  12:00 PM

## 2021-01-26 LAB — GLUCOSE, CAPILLARY
Glucose-Capillary: 145 mg/dL — ABNORMAL HIGH (ref 70–99)
Glucose-Capillary: 149 mg/dL — ABNORMAL HIGH (ref 70–99)
Glucose-Capillary: 206 mg/dL — ABNORMAL HIGH (ref 70–99)
Glucose-Capillary: 156 mg/dL — ABNORMAL HIGH (ref 70–99)

## 2021-01-26 LAB — BASIC METABOLIC PANEL
Anion gap: 7 (ref 5–15)
BUN: 65 mg/dL — ABNORMAL HIGH (ref 8–23)
CO2: 42 mmol/L — ABNORMAL HIGH (ref 22–32)
Calcium: 9.1 mg/dL (ref 8.9–10.3)
Chloride: 87 mmol/L — ABNORMAL LOW (ref 98–111)
Creatinine, Ser: 2.99 mg/dL — ABNORMAL HIGH (ref 0.44–1.00)
GFR, Estimated: 17 mL/min — ABNORMAL LOW (ref >60)
Glucose, Bld: 118 mg/dL — ABNORMAL HIGH (ref 70–99)
Potassium: 4.1 mmol/L (ref 3.5–5.1)
Sodium: 136 mmol/L (ref 135–145)

## 2021-01-26 NOTE — Progress Notes (Signed)
SATURATION QUALIFICATIONS: (This note is used to comply with regulatory documentation for home oxygen)  Patient Saturations on Room Air at Rest = 97%  Patient Saturations on Room Air while Ambulating = 84%  Patient Saturations on 1 Liter of oxygen while Ambulating  = 90%  Please briefly explain why patient needs home oxygen: Patient desaturates rapidly when ambulating.

## 2021-01-26 NOTE — Progress Notes (Signed)
  [facility] Progress Note     Assessment/ Plan:   1. AKI on CKD IV: 1.11 in 2019 with likely progressive CKD but definitive baseline hard to ascertain. Longstanding DM (A1c 11.6 in 2019), HTN, and known vascular disease have certainly taken their toll. UA, UP/C bland with 1.12 g proteinuria. SPEP w/ no m spike. Renal US 05/2020 with echogenicity.  -Creatinine slowly improving/stabilizing at 3; this may represent her new baseline -No acute indication for renal replacement therapy at this time -Continue diuresis with torsemide per heart failure; careful with acetazolamide would not dose greater than 250 mg daily due to possibility of CNS toxicity.  2. Acute systolic CHF exacerbation: per AHF. EF 20-25% on TTE 12/2020. On milrinone and torsemide  3. DM II: recent A1c under control in the 6s. Per primary  4. Dispo: admitted  Given the patient's stability in creatinine (likely new baseline) and overall improvement we will sign off at this time. She should follow up with [place] outpatient around 2 weeks after discharge. Please notify our team at time of discharge and we will arrange this. If further concerns arise please let us know.  Subjective:   Excellent UOP w/ 3.1L made. Crt stable at 3. No complaints today.   Objective:   BP (!) 147/75 (BP Location: Right Arm)   Pulse 84   Temp 98 F (36.7 C) (Oral)   Resp 17   Ht '5\' 4"'$  (1.626 m)   Wt 53.4 kg   SpO2 100%   BMI 20.20 kg/m   Intake/Output Summary (Last 24 hours) at 01/26/2021 [id number] Last data filed at 01/26/2021 0630 Gross per 24 hour  Intake 720 ml  Output 2150 ml  Net -1430 ml   Weight change:   Physical Exam: GEN: NAD, sitting in chair PULM bilateral chest rise with no increased work of breathing CV RRR ABD mildly bloated EXT tredema, UNNA boots NEURO AAO x 3 nonfocal  Imaging: No results found.  Labs: BMET Recent Labs  Lab 01/20/21 0116 01/21/21 0220 01/22/21 0412 01/23/21 0253  01/24/21 0329 01/25/21 0339 01/26/21 0116  NA 141 137 138 137 137 135 136  K 4.1 3.8 3.6 3.9 3.9 4.0 4.1  CL 94* 92* 89* 86* 84* 84* 87*  CO2 37* 38* 39* 40* 44* 43* 42*  GLUCOSE 187* 161* 128* 130* 140* 146* 118*  BUN 84* 83* 74* 76* 75* 69* 65*  CREATININE 3.45* 3.80* 3.39* 3.36* 3.21* 3.06* 2.99*  CALCIUM 8.8* 8.7* 9.0 9.1 9.1 9.1 9.1   CBC Recent Labs  Lab 01/20/21 0116 01/21/21 0220 01/22/21 0412 01/23/21 0253  WBC 5.2 6.3 5.3 6.0  NEUTROABS 4.0 4.8 3.9  --   HGB 9.1* 9.0* 9.2* 9.5*  HCT 28.4* 28.1* 28.5* 30.1*  MCV 99.6 98.6 99.0 97.7  PLT 142* 138* 125* 132*    Medications:    . aspirin EC  81 mg Oral Daily  . atorvastatin  40 mg Oral Daily  . enoxaparin (LOVENOX) injection  30 mg Subcutaneous Q24H  . insulin aspart  0-5 Units Subcutaneous QHS  . insulin aspart  0-9 Units Subcutaneous TID WC  . isosorbide-hydrALAZINE  1 tablet Oral TID  . polyethylene glycol  17 g Oral Daily  . sodium chloride flush  3 mL Intravenous Q12H  . sodium chloride flush  3 mL Intravenous Q12H  . torsemide  60 mg Oral Daily      Reesa Chew  01/26/2021, 9:28 AM

## 2021-01-26 NOTE — Progress Notes (Signed)
Patient ID: Beverly Martin, female   DOB: Jun 20, 1956, 65 y.o.   MRN: [id number]    Advanced Heart Failure Team Rounding Note   PCP:  Billie Ruddy, MD  PCP-Cardiology: Mertie Moores, MD     Reason for Admission: Acute on Chronic Combined Systolic and Diastolic CHF   HPI:    Milrinone stopped yesterday and switched to torsemide 40 daily. Weight down another pound. Now down 23 pounds total.   Creatinine  3.84 -> 3.45 -> 3.80 -> 3.39->3.36-> 3.21->3.06-> 2.99  Denies CP, SOB, orthopnea or PND.    Meds:     . aspirin EC  81 mg Oral Daily  . atorvastatin  40 mg Oral Daily  . enoxaparin (LOVENOX) injection  30 mg Subcutaneous Q24H  . insulin aspart  0-5 Units Subcutaneous QHS  . insulin aspart  0-9 Units Subcutaneous TID WC  . isosorbide-hydrALAZINE  1 tablet Oral TID  . polyethylene glycol  17 g Oral Daily  . sodium chloride flush  3 mL Intravenous Q12H  . sodium chloride flush  3 mL Intravenous Q12H  . torsemide  60 mg Oral Daily   . sodium chloride 8 mL/hr at 01/22/21 1607  . sodium chloride Stopped (01/21/21 2108)      Objective:    Vital Signs:   Temp:  [97.9 F (36.6 C)-98 F (36.7 C)] 98 F (36.7 C) (04/16 0420) Pulse Rate:  [84-90] 84 (04/16 0420) Resp:  [16-17] 17 (04/16 0420) BP: (132-147)/(70-75) 147/75 (04/16 0420) SpO2:  [94 %-100 %] 100 % (04/16 0420) Weight:  [53.4 kg] 53.4 kg (04/16 0420) Last BM Date: 01/24/21 Filed Weights   01/24/21 0500 01/25/21 0822 01/26/21 0420  Weight: 56.3 kg 53.9 kg 53.4 kg     Physical Exam     General:  Sitting in chair. No resp difficulty HEENT: normal Neck: supple. JVP 9 with cv waves Carotids 2+ bilat; no bruits. No lymphadenopathy or thryomegaly appreciated. Cor: PMI nondisplaced. Regular rate & rhythm. + s3 Lungs: clear Abdomen: soft, nontender, nondistended. No hepatosplenomegaly. No bruits or masses. Good bowel sounds. Extremities: no cyanosis, clubbing, rash, tr-1+ edema Neuro: alert & orientedx3,  cranial nerves grossly intact. moves all 4 extremities w/o difficulty. Affect pleasant   Telemetry   NSR 80-90 Personally reviewed  Labs     Basic Metabolic Panel: Recent Labs  Lab 01/22/21 0412 01/23/21 0253 01/24/21 0329 01/25/21 0339 01/26/21 0116  NA 138 137 137 135 136  K 3.6 3.9 3.9 4.0 4.1  CL 89* 86* 84* 84* 87*  CO2 39* 40* 44* 43* 42*  GLUCOSE 128* 130* 140* 146* 118*  BUN 74* 76* 75* 69* 65*  CREATININE 3.39* 3.36* 3.21* 3.06* 2.99*  CALCIUM 9.0 9.1 9.1 9.1 9.1  MG  --  2.3  --   --   --     Liver Function Tests: No results for input(s): AST, ALT, ALKPHOS, BILITOT, PROT, ALBUMIN in the last 168 hours. No results for input(s): LIPASE, AMYLASE in the last 168 hours. No results for input(s): AMMONIA in the last 168 hours.  CBC: Recent Labs  Lab 01/20/21 0116 01/21/21 0220 01/22/21 0412 01/23/21 0253  WBC 5.2 6.3 5.3 6.0  NEUTROABS 4.0 4.8 3.9  --   HGB 9.1* 9.0* 9.2* 9.5*  HCT 28.4* 28.1* 28.5* 30.1*  MCV 99.6 98.6 99.0 97.7  PLT 142* 138* 125* 132*    Cardiac Enzymes: No results for input(s): CKTOTAL, CKMB, CKMBINDEX, TROPONINI in the last 168 hours.  BNP: BNP (  last 3 results) Recent Labs    07/11/20 0321 01/03/21 1016 01/17/21 1006  BNP 1,575* >4,500.0* >4,500.0*    ProBNP (last 3 results) Recent Labs    12/06/20 0927  PROBNP >5000.0*     CBG: Recent Labs  Lab 01/25/21 0736 01/25/21 1136 01/25/21 1622 01/25/21 2127 01/26/21 0753  GLUCAP 129* 204* 180* 149* 145*    Coagulation Studies: No results for input(s): LABPROT, INR in the last 72 hours.  Imaging: No results found.    Assessment/Plan   1. Acute on Chronic Combined Systolic and Diastolic CHF: - Likely mixed ischemic and NICM, poorly controlled HTN, poor medication adherence - EF down from 45-50% in 2019 to now 20-25% - Markedly overloaded on admission.  - Weight now down 23 pounds. Off milrinone. On po torsemide. Fluid still up slightly - GDMT limited by  renal function, No ARN with low GFR, no bb with severely low EF and decompensation  - Continue Bidil 1 tab tid - GFR < 20 so no SGLT2i yet - continue torsemide 40 daily  2. T2DM: - was very poorly controlled until renal function worsened  - not a candidate for SGLT2i with renal fx and hx of urosepsis - SSI sensitive/renal  3. HTN: - Bidil cut back and systolic bp has been 99991111,  4. AKI on CKD4: - Renal function labile initially with diuresis and milrinone support but seems to have started to trend in the right direction  cr 3.1>3.6>3.7>3.87> 3.45>3.8> 3.39>3.36>3.21>3.06 -> 2.99 - likely a combination of hypertensive and diabetic nephropathy,now a component of cardiorenal syndrome - back to baseline. Renal has signed off    Glori Bickers, MD 01/26/2021, 11:27 AM  Advanced Heart Failure Team Pager [phone] (M-F; 7a - 5p)  Please contact [facility] for night-coverage after hours (4p -7a ) and weekends on [email or web address]

## 2021-01-26 NOTE — Progress Notes (Signed)
SATURATION QUALIFICATIONS: (This note is used to comply with regulatory documentation for home oxygen)  Patient Saturations on Room Air at Rest = 100%  Patient Saturations on Room Air while Ambulating = 81%  Patient Saturations on 1 Liters of oxygen while Ambulating = 95%  Please briefly explain why patient needs home oxygen:  Pt requires 1 L supplemental O2 via [place] to maintain SaO2 above 90%O2.  Montay Vanvoorhis B. Migdalia Dk PT, DPT Acute Rehabilitation Services Pager [phone] Office [phone]

## 2021-01-26 NOTE — TOC Transition Note (Addendum)
Transition of Care [facility]) - CM/SW Discharge Note   Patient Details  Name: Beverly Martin MRN: [id number] Date of Birth: 28-Jul-1956  Transition of [facility] CM/SW Contact:  Zenon Mayo, RN Phone Number: 01/26/2021, 12:09 PM   Clinical Narrative:    NCM spoke with patient at bedside, she will need home oxygen at dc.  She states she is ok with [employer] supplying her oxygen.  She states she has a scale to weigh herself,  She lives with her spouse, she states she does not have a bp cuff.  Her spouse transports her to her MD apts.  NCM made referral to [facility] for the home oxygen and notified MD to put order in for home oxygen.     Final next level of care: Home/Self Care Barriers to Discharge: Continued Medical Work up   Patient Goals and CMS Choice Patient states their goals for this hospitalization and ongoing recovery are:: to return home   Choice offered to / list presented to : NA  Discharge Placement                       Discharge Plan and Services In-house Referral: Ehtics Consult Discharge Planning Services: CM Consult            DME Arranged: Oxygen DME Agency: [employer] Date DME Agency Contacted: 01/26/21 Time DME Agency Contacted: 1209 Representative spoke with at DME Agency: [place]: NA          Social Determinants of Health ([place]) Interventions     Readmission Risk Interventions No flowsheet data found.

## 2021-01-26 NOTE — Progress Notes (Signed)
Physical Therapy Treatment Patient Details Name: Beverly Martin MRN: [id number] DOB: 08-02-1956 Today's Date: 01/26/2021    History of Present Illness 65 y.o. female presents to [facility] ED on 01/17/2021 with concern for continued volume overload, SOB. Pt underwent R heart cath on 4/8. Pt with tenuous renal function during admission. PMH includes hypertension, diabetes type 2, CAD, CABG, chronic bilateral pedal edema.    PT Comments    Pt sitting up in chair, agreeable to walking with therapy to determine supplemental O2 need for discharge. Pt continues to be independent for mobility and balance. Pt does require 1L supplemental O2 via [place] to maintain SaO2 >90%O2 with ambulation. Pt is looking forward to discharge home this afternoon.     Follow Up Recommendations  No PT follow up     Equipment Recommendations  None recommended by PT       Precautions / Restrictions Precautions Precautions: Other (comment) (monitor oxygen saturation) Restrictions Weight Bearing Restrictions: No    Mobility  Bed Mobility               General bed mobility comments: OOB in recliner    Transfers Overall transfer level: Independent Equipment used: None             General transfer comment: stands with no assist and good balance  Ambulation/Gait Ambulation/Gait assistance: Independent Gait Distance (Feet): 750 Feet Assistive device: None Gait Pattern/deviations: WFL(Within Functional Limits) Gait velocity: functional Gait velocity interpretation: >2.62 ft/sec, indicative of community ambulatory General Gait Details: Pt with no c/o SoB however with ambulation on RA SaO2 drops to 81%O2 on RA. Pt requires 1 L O2 via [place] to maintain SaO2 >95%O2 with ambulation         Balance Overall balance assessment: Independent                                          Cognition Arousal/Alertness: Awake/alert Behavior During Therapy: WFL for tasks assessed/performed Overall  Cognitive Status: Within Functional Limits for tasks assessed                                           General Comments General comments (skin integrity, edema, etc.): HR 72-96 bpm, requiring 1L O2 via [place] to maintain SaO2 >90%O2      Pertinent Vitals/Pain Pain Assessment: No/denies pain           PT Goals (current goals can now be found in the care plan section) Acute Rehab PT Goals Patient Stated Goal: To go home. PT Goal Formulation: With patient Time For Goal Achievement: 02/07/21 Potential to Achieve Goals: Good Progress towards PT goals: Progressing toward goals    Frequency    Min 3X/week      PT Plan Current plan remains appropriate       AM-PAC PT "6 Clicks" Mobility   Outcome Measure  Help needed turning from your back to your side while in a flat bed without using bedrails?: None Help needed moving from lying on your back to sitting on the side of a flat bed without using bedrails?: None Help needed moving to and from a bed to a chair (including a wheelchair)?: None Help needed standing up from a chair using your arms (e.g., wheelchair or bedside chair)?: None Help needed to  walk in hospital room?: None Help needed climbing 3-5 steps with a railing? : None 6 Click Score: 24    End of Session Equipment Utilized During Treatment: Gait belt;Oxygen Activity Tolerance: Patient tolerated treatment well Patient left: in chair;with call bell/phone within reach Nurse Communication: Mobility status PT Visit Diagnosis: Other (comment) (Cardiopulmonary deficitrs, oxygen deficits)     Time: [id number] PT Time Calculation (min) (ACUTE ONLY): 17 min  Charges:  $Therapeutic Exercise: 8-22 mins                     Beverly Martin B. Migdalia Dk PT, DPT Acute Rehabilitation Services Pager [phone] Office [phone]    [place] 01/26/2021, 10:04 AM

## 2021-01-27 LAB — GLUCOSE, CAPILLARY
Glucose-Capillary: 133 mg/dL — ABNORMAL HIGH (ref 70–99)
Glucose-Capillary: 331 mg/dL — ABNORMAL HIGH (ref 70–99)

## 2021-01-27 LAB — BASIC METABOLIC PANEL
Anion gap: 9 (ref 5–15)
BUN: 61 mg/dL — ABNORMAL HIGH (ref 8–23)
CO2: 39 mmol/L — ABNORMAL HIGH (ref 22–32)
Calcium: 9.3 mg/dL (ref 8.9–10.3)
Chloride: 88 mmol/L — ABNORMAL LOW (ref 98–111)
Creatinine, Ser: 2.91 mg/dL — ABNORMAL HIGH (ref 0.44–1.00)
GFR, Estimated: 17 mL/min — ABNORMAL LOW (ref >60)
Glucose, Bld: 113 mg/dL — ABNORMAL HIGH (ref 70–99)
Potassium: 4.1 mmol/L (ref 3.5–5.1)
Sodium: 136 mmol/L (ref 135–145)

## 2021-01-27 MED ORDER — ISOSORB DINITRATE-HYDRALAZINE 20-37.5 MG PO TABS: 1.0000 | ORAL_TABLET | Freq: Three times a day (TID) | ORAL | 3 refills | Status: DC

## 2021-01-27 MED ORDER — TORSEMIDE 40 MG PO TABS: 40.0000 mg | ORAL_TABLET | Freq: Every day | ORAL | 3 refills | Status: DC

## 2021-01-27 NOTE — Discharge Summary (Signed)
Advanced Heart Failure Team  Discharge Summary   Patient ID: Beverly Martin MRN: [id number], DOB/AGE: 1956/06/10 65 y.o. Admit date: 01/17/2021 D/C date:     01/27/2021   Primary Discharge Diagnoses:  1. Acute on chronic systolic heart failure 2. Acute on chronic renal failure stage IV 3. DM2 4. HTN 5. CAD s/p CABG   Hospital Course:   Beverly Martin a 65 y.o.femalewith a PMH significant for HFrEF, CAD s/p CABG, HTN, T2DM, HLD, CKD IV.  Admitted from [facility] on 01/17/21 with marked volume overload/ADHF.  Echo 01/03/21:  EF 20-25% with severe RV dysfunction (previous EF 40-45%). Felt to be mixed ischemic/NNICM but unable to perform coronary angio due to CKD.  Initially poor diuresis with IV lasix.   Underwent RHC as below which showed marked volume overload with biventricular failure and normal CO. Given lack of response to IV lasix. Lasix dose increased 1250 bid. Metolazone and low-dose milrinone 0.125 mcg/kg/min added and renal consulted.  Diuresis subsequently improved and patient diuresed from 140 -> 115 pounds. Creatinine improved 3.5 -> 2.9. IV lasix and milrinone stopped. Successfully transitioned to oral torsemide with good result.   On day of dc patient felt well. Denies SOB, orthopnea or PND  Discharge Weight Range: 115 lbs Discharge Vitals: Blood pressure 140/67, pulse 81, temperature 98.3 F (36.8 C), temperature source Oral, resp. rate 15, height _0  (1.626 m), weight 52.3 kg, SpO2 100 %.  General:  Sitting up in chair . No resp difficulty HEENT: normal Neck: supple. JVP 7 prominent v waves. Carotids 2+ bilat; no bruits. No lymphadenopathy or thryomegaly appreciated. Cor: PMI nondisplaced. Regular rate & rhythm. No rubs, gallops or murmurs. Lungs: clear Abdomen: soft, nontender, nondistended. No hepatosplenomegaly. No bruits or masses. Good bowel sounds. Extremities: no cyanosis, clubbing, rash, tr edema +UNNA boots Neuro: alert & orientedx3, cranial  nerves grossly intact. moves all 4 extremities w/o difficulty. Affect pleasant   Labs: Lab Results  Component Value Date   WBC 6.0 01/23/2021   HGB 9.5 (L) 01/23/2021   HCT 30.1 (L) 01/23/2021   MCV 97.7 01/23/2021   PLT 132 (L) 01/23/2021    Recent Labs  Lab 01/27/21 0211  NA 136  K 4.1  CL 88*  CO2 39*  BUN 61*  CREATININE 2.91*  CALCIUM 9.3  GLUCOSE 113*   Lab Results  Component Value Date   CHOL 245 (H) 06/10/2020   HDL 69 06/10/2020   LDLCALC 157 (H) 06/10/2020   TRIG 93 06/10/2020   BNP (last 3 results) Recent Labs    07/11/20 0321 01/03/21 1016 01/17/21 1006  BNP 1,575* >4,500.0* >4,500.0*    ProBNP (last 3 results) Recent Labs    12/06/20 0927  PROBNP >5000.0*     Diagnostic Studies/Procedures   No results found.  Discharge Medications   Allergies as of 01/27/2021   No Known Allergies     Medication List    STOP taking these medications   carvedilol 6.25 MG tablet Commonly known as: COREG     TAKE these medications   aspirin 81 MG EC tablet Take 1 tablet (81 mg total) by mouth daily. Swallow whole.   atorvastatin 40 MG tablet Commonly known as: LIPITOR TAKE 1 TABLET BY MOUTH EVERY DAY   blood glucose meter kit and supplies Kit Dispense based on patient and insurance preference. Use up to four times daily as directed. (FOR ICD-9 250.00, 250.01).   Blood Pressure Kit Use as directed for checking BP daily.  calcitRIOL 0.25 MCG capsule Commonly known as: ROCALTROL TAKE 1 CAPSULE BY MOUTH EVERY DAY What changed: how much to take   [employer] Lancets Micro Thin 33G Misc Use as directed for testing blood sugar twice daily, more frequently for hyper or hypoglycemia.   isosorbide-hydrALAZINE 20-37.5 MG tablet Commonly known as: BIDIL Take 1 tablet by mouth 3 (three) times daily. What changed: how much to take   Torsemide 40 MG Tabs Take 40 mg by mouth daily. What changed: See the new instructions.       Disposition   The  patient will be discharged in stable condition to home.   Follow-up Information    [facility] Follow up.   Why: home oxygen Contact information: [place] [id number] [phone]                 Duration of Discharge Encounter: Greater than 35 minutes   SignedGlori Bickers  MD 01/27/2021, 12:08 PM

## 2021-01-27 NOTE — TOC Transition Note (Signed)
Transition of Care [facility]) - CM/SW Discharge Note   Patient Details  Name: Beverly Martin MRN: [id number] Date of Birth: September 26, 1956  [facility] CM/SW Contact:  Maebelle Munroe, RN Phone Number: 01/27/2021, 5:00 PM   Clinical Narrative:   [facility] team for discharge planning. Spoke to patient regarding need for home O2. She agrees with discharge plan. Call to [employer]: Jerm[place] to deliver O2 to bedside and arrange home delivery.    Final next level of care: Home/Self Care Barriers to Discharge: No Barriers Identified   Patient Goals and CMS Choice Patient states their goals for this hospitalization and ongoing recovery are:: to return home   Choice offered to / list presented to : NA  Discharge Placement                       Discharge Plan and Services In-house Referral: Ehtics Consult Discharge Planning Services: CM Consult            DME Arranged: Oxygen DME Agency: Other - Comment ([employer]) Date DME Agency Contacted: 01/27/21 Time DME Agency Contacted: [id number] Representative spoke with at DME Agency: Brenton [phone] [facility] Arranged: NA          Social Determinants of Health ([place]) Interventions     Readmission Risk Interventions No flowsheet data found.

## 2021-01-27 NOTE — Progress Notes (Signed)
Reviewed need for home O2 with Dr. Sung Amabile and updated readings. He recommends 1L continuous with 2L with exertion. Order written. The order will not let you specify a range so I outlined instructions in the comments and relayed recommendation to nurse.

## 2021-01-27 NOTE — Progress Notes (Addendum)
SATURATION QUALIFICATIONS: (This note is used to comply with regulatory documentation for home oxygen)  Patient Saturations on Room Air at Rest = 85%  Patient Saturations on Room Air while Ambulating =83%  Patient Saturations on 1.0 Liters of oxygen while Ambulating = 91%  Please briefly explain why patient needs home oxygen: patient desats when ambulating without oxygen.

## 2021-01-27 NOTE — Discharge Instructions (Signed)
Heart Failure Action Plan A heart failure action plan helps you understand what to do when you have symptoms of heart failure. Your action plan is a color-coded plan that lists the symptoms to watch for and indicates what actions to take.  If you have symptoms in the red zone, you need medical care right away.  If you have symptoms in the yellow zone, you are having problems.  If you have symptoms in the green zone, you are doing well. Follow the plan that was created by you and your health care provider. Review your plan each time you visit your health care provider. Red zone These signs and symptoms mean you should get medical help right away:  You have trouble breathing when resting.  You have a dry cough that is getting worse.  You have swelling or pain in your legs or abdomen that is getting worse.  You suddenly gain more than 2-3 lb (0.9-1.4 kg) in 24 hours, or more than 5 lb (2.3 kg) in a week. This amount may be more or less depending on your condition.  You have trouble staying awake or you feel confused.  You have chest pain.  You do not have an appetite.  You pass out.  You have worsening sadness or depression. If you have any of these symptoms, call your local emergency services (911 in the [place] right away. Do not drive yourself to the hospital.   Yellow zone These signs and symptoms mean your condition may be getting worse and you should make some changes:  You have trouble breathing when you are active, or you need to sleep with your head raised on extra pillows to help you breathe.  You have swelling in your legs or abdomen.  You gain 2-3 lb (0.9-1.4 kg) in 24 hours, or 5 lb (2.3 kg) in a week. This amount may be more or less depending on your condition.  You get tired easily.  You have trouble sleeping.  You have a dry cough. If you have any of these symptoms:  Contact your health care provider within the next day.  Your health care provider may adjust  your medicines.   Green zone These signs mean you are doing well and can continue what you are doing:  You do not have shortness of breath.  You have very little swelling or no new swelling.  Your weight is stable (no gain or loss).  You have a normal activity level.  You do not have chest pain or any other new symptoms.   Follow these instructions at home:  Take over-the-counter and prescription medicines only as told by your health care provider.  Weigh yourself daily. Your target weight is __________ lb (__________ kg). ? Call your health care provider if you gain more than __________ lb (__________ kg) in 24 hours, or more than __________ lb (__________ kg) in a week. ? Health care provider name: _____________________________________________________ ? Health care provider phone number: _____________________________________________________  Eat a heart-healthy diet. Work with a diet and nutrition specialist (dietitian) to create an eating plan that is best for you.  Keep all follow-up visits. This is important. Where to find more information  American Heart Association: www.heart.org Summary  A heart failure action plan helps you understand what to do when you have symptoms of heart failure.  Follow the action plan that was created by you and your health care provider.  Get help right away if you have any symptoms in the red zone.  This information is not intended to replace advice given to you by your health care provider. Make sure you discuss any questions you have with your health care provider. Document Revised: 05/14/2020 Document Reviewed: 05/14/2020 Elsevier Patient Education  2021 [place].    Low-Sodium Eating Plan Sodium, which is an element that makes up salt, helps you maintain a healthy balance of fluids in your body. Too much sodium can increase your blood pressure and cause fluid and waste to be held in your body. Your health care provider or  dietitian may recommend following this plan if you have high blood pressure (hypertension), kidney disease, liver disease, or heart failure. Eating less sodium can help lower your blood pressure, reduce swelling, and protect your heart, liver, and kidneys. What are tips for following this plan? Reading food labels  The Nutrition Facts label lists the amount of sodium in one serving of the food. If you eat more than one serving, you must multiply the listed amount of sodium by the number of servings.  Choose foods with less than 140 mg of sodium per serving.  Avoid foods with 300 mg of sodium or more per serving. Shopping  Look for lower-sodium products, often labeled as "low-sodium" or "no salt added."  Always check the sodium content, even if foods are labeled as "unsalted" or "no salt added."  Buy fresh foods. ? Avoid canned foods and pre-made or frozen meals. ? Avoid canned, cured, or processed meats.  Buy breads that have less than 80 mg of sodium per slice.   Cooking  Eat more home-cooked food and less restaurant, buffet, and fast food.  Avoid adding salt when cooking. Use salt-free seasonings or herbs instead of table salt or sea salt. Check with your health care provider or pharmacist before using salt substitutes.  Cook with plant-based oils, such as canola, sunflower, or olive oil.   Meal planning  When eating at a restaurant, ask that your food be prepared with less salt or no salt, if possible. Avoid dishes labeled as brined, pickled, cured, smoked, or made with soy sauce, miso, or teriyaki sauce.  Avoid foods that contain MSG (monosodium glutamate). MSG is sometimes added to [place] food, bouillon, and some canned foods.  Make meals that can be grilled, baked, poached, roasted, or steamed. These are generally made with less sodium. General information Most people on this plan should limit their sodium intake to 1,500-2,000 mg (milligrams) of sodium each day. What foods  should I eat? Fruits Fresh, frozen, or canned fruit. Fruit juice. Vegetables Fresh or frozen vegetables. "No salt added" canned vegetables. "No salt added" tomato sauce and paste. Low-sodium or reduced-sodium tomato and vegetable juice. Grains Low-sodium cereals, including oats, puffed wheat and rice, and shredded wheat. Low-sodium crackers. Unsalted rice. Unsalted pasta. Low-sodium bread. Whole-grain breads and whole-grain pasta. Meats and other proteins Fresh or frozen (no salt added) meat, poultry, seafood, and fish. Low-sodium canned tuna and salmon. Unsalted nuts. Dried peas, beans, and lentils without added salt. Unsalted canned beans. Eggs. Unsalted nut butters. Dairy Milk. Soy milk. Cheese that is naturally low in sodium, such as ricotta cheese, fresh mozzarella, or Swiss cheese. Low-sodium or reduced-sodium cheese. Cream cheese. Yogurt. Seasonings and condiments Fresh and dried herbs and spices. Salt-free seasonings. Low-sodium mustard and ketchup. Sodium-free salad dressing. Sodium-free light mayonnaise. Fresh or refrigerated horseradish. Lemon juice. Vinegar. Other foods Homemade, reduced-sodium, or low-sodium soups. Unsalted popcorn and pretzels. Low-salt or salt-free chips. The items listed above may not be a  complete list of foods and beverages you can eat. Contact a dietitian for more information. What foods should I avoid? Vegetables Sauerkraut, pickled vegetables, and relishes. Olives. [place] fries. Onion rings. Regular canned vegetables (not low-sodium or reduced-sodium). Regular canned tomato sauce and paste (not low-sodium or reduced-sodium). Regular tomato and vegetable juice (not low-sodium or reduced-sodium). Frozen vegetables in sauces. Grains Instant hot cereals. Bread stuffing, pancake, and biscuit mixes. Croutons. Seasoned rice or pasta mixes. Noodle soup cups. Boxed or frozen macaroni and cheese. Regular salted crackers. Self-rising flour. Meats and other  proteins Meat or fish that is salted, canned, smoked, spiced, or pickled. Precooked or cured meat, such as sausages or meat loaves. Berniece Salines. Ham. Pepperoni. Hot dogs. Corned beef. Chipped beef. Salt pork. Jerky. Pickled herring. Anchovies and sardines. Regular canned tuna. Salted nuts. Dairy Processed cheese and cheese spreads. Hard cheeses. Cheese curds. Blue cheese. Feta cheese. String cheese. Regular cottage cheese. Buttermilk. Canned milk. Fats and oils Salted butter. Regular margarine. Ghee. Bacon fat. Seasonings and condiments Onion salt, garlic salt, seasoned salt, table salt, and sea salt. Canned and packaged gravies. Worcestershire sauce. Tartar sauce. Barbecue sauce. Teriyaki sauce. Soy sauce, including reduced-sodium. Steak sauce. Fish sauce. Oyster sauce. Cocktail sauce. Horseradish that you find on the shelf. Regular ketchup and mustard. Meat flavorings and tenderizers. Bouillon cubes. Hot sauce. Pre-made or packaged marinades. Pre-made or packaged taco seasonings. Relishes. Regular salad dressings. Salsa. Other foods Salted popcorn and pretzels. Corn chips and puffs. Potato and tortilla chips. Canned or dried soups. Pizza. Frozen entrees and pot pies. The items listed above may not be a complete list of foods and beverages you should avoid. Contact a dietitian for more information. Summary  Eating less sodium can help lower your blood pressure, reduce swelling, and protect your heart, liver, and kidneys.  Most people on this plan should limit their sodium intake to 1,500-2,000 mg (milligrams) of sodium each day.  Canned, boxed, and frozen foods are high in sodium. Restaurant foods, fast foods, and pizza are also very high in sodium. You also get sodium by adding salt to food.  Try to cook at home, eat more fresh fruits and vegetables, and eat less fast food and canned, processed, or prepared foods. This information is not intended to replace advice given to you by your health care  provider. Make sure you discuss any questions you have with your health care provider. Document Revised: 11/04/2019 Document Reviewed: 08/31/2019 Elsevier Patient Education  2021 [employer].

## 2021-01-29 ENCOUNTER — Telehealth (HOSPITAL_COMMUNITY): Payer: Self-pay | Admitting: Licensed Clinical Social Worker

## 2021-01-29 NOTE — Telephone Encounter (Signed)
CSW consulted by inpatient CSW to assist pt with follow up regarding disability   CSW attempted to call pt to discuss- left VM requesting return call  Jorge Ny, [facility] Desk#: [phone] Cell#: [phone]

## 2021-02-03 NOTE — Progress Notes (Signed)
Cardiology Office Note:    Date:  02/04/2021   ID:  LORELAI HUYSER, DOB [birth year], MRN [id number]  PCP:  Billie Ruddy, MD   [place]  Cardiologist:  Mertie Moores, MD  Advanced Practice Provider:  [place] Electrophysiologist:  None  :[phone]}   Referring MD: Billie Ruddy, MD   Chief Complaint  Patient presents with  . Congestive Heart Failure  . Chronic Kidney Disease     February 04, 2021   Beverly Martin is a 66 y.o. female with a hx of CAD, CABG, HTN, DM CHF  I met her during a hospitalization in Jan . 2019 She was recently hospitalized for CHF . Echo shows EF 45-50%. Was seen by Advanced CHF ( [facility] ) for the duration of the hospitalization Has severe RV dysfunction Diuresed from 140 to 115 lbs. Creatinine improved from 3.5 to 2.9  No CP  Has not seen nephrology   Has been eating bacon at home.  Thought it was ok  Advised her to avoid salty / processed foods   She will need to follow up with [facility]   Has a loud right carotid bruit on exam    Past Medical History:  Diagnosis Date  . CAD (coronary artery disease)   . Diabetes mellitus without complication ([place])   . Essential hypertension   . Hyperlipidemia     Past Surgical History:  Procedure Laterality Date  . CARDIAC SURGERY     CABG  . CORONARY ARTERY BYPASS GRAFT    . RIGHT HEART CATH N/A 01/18/2021   Procedure: RIGHT HEART CATH;  Surgeon: Jolaine Artist, MD;  Location: [facility];  Service: Cardiovascular;  Laterality: N/A;    Current Medications: Current Meds  Medication Sig  . aspirin EC 81 MG EC tablet Take 1 tablet (81 mg total) by mouth daily. Swallow whole.  Marland Kitchen atorvastatin (LIPITOR) 40 MG tablet TAKE 1 TABLET BY MOUTH EVERY DAY  . blood glucose meter kit and supplies KIT Dispense based on patient and insurance preference. Use up to four times daily as directed. (FOR ICD-9 250.00, 250.01).  . Blood Pressure KIT Use as directed for  checking BP daily.  . calcitRIOL (ROCALTROL) 0.25 MCG capsule TAKE 1 CAPSULE BY MOUTH EVERY DAY  . CVS Lancets Micro Thin 33G MISC Use as directed for testing blood sugar twice daily, more frequently for hyper or hypoglycemia.  Marland Kitchen isosorbide-hydrALAZINE (BIDIL) 20-37.5 MG tablet Take 1 tablet by mouth 3 (three) times daily.  . Torsemide 40 MG TABS Take 40 mg by mouth daily.     Allergies:   Patient has no known allergies.   Social History   Socioeconomic History  . Marital status: Married    Spouse name: Not on file  . Number of children: Not on file  . Years of education: Not on file  . Highest education level: Not on file  Occupational History  . Not on file  Tobacco Use  . Smoking status: Never Smoker  . Smokeless tobacco: Never Used  Vaping Use  . Vaping Use: Never used  Substance and Sexual Activity  . Alcohol use: No  . Drug use: No  . Sexual activity: Not Currently  Other Topics Concern  . Not on file  Social History Narrative  . Not on file   Social Determinants of Health   Financial Resource Strain: Low Risk   . Difficulty of Paying Living Expenses: Not hard at all  Food  Insecurity: No Food Insecurity  . Worried About Charity fundraiser in the Last Year: Never true  . Ran Out of Food in the Last Year: Never true  Transportation Needs: No Transportation Needs  . Lack of Transportation (Medical): No  . Lack of Transportation (Non-Medical): No  Physical Activity: Insufficiently Active  . Days of Exercise per Week: 2 days  . Minutes of Exercise per Session: 20 min  Stress: Not on file  Social Connections: Not on file     Family History: The patient's family history includes Diabetes Mellitus II in her mother; Esophageal cancer in her father.  ROS:   Please see the history of present illness.     All other systems reviewed and are negative.  EKGs/Labs/Other Studies Reviewed:    The following studies were reviewed today:   EKG:    Recent  Labs: 06/10/2020: TSH 2.053 12/06/2020: Pro B Natriuretic peptide (BNP) >5000.0 01/17/2021: B Natriuretic Peptide >4,500.0 01/18/2021: ALT 21 01/23/2021: Hemoglobin 9.5; Magnesium 2.3; Platelets 132 01/27/2021: BUN 61; Creatinine, Ser 2.91; Potassium 4.1; Sodium 136  Recent Lipid Panel    Component Value Date/Time   CHOL 245 (H) 06/10/2020 1556   TRIG 93 06/10/2020 1556   HDL 69 06/10/2020 1556   CHOLHDL 3.6 06/10/2020 1556   VLDL 19 06/10/2020 1556   LDLCALC 157 (H) 06/10/2020 1556     Risk Assessment/Calculations:       Physical Exam:    VS:  BP (!) 150/72   Pulse 83   Ht $R'5\' 4"'Os$  (1.626 m)   Wt 121 lb (54.9 kg)   SpO2 99%   BMI 20.77 kg/m     Wt Readings from Last 3 Encounters:  02/04/21 121 lb (54.9 kg)  01/27/21 115 lb 4.8 oz (52.3 kg)  01/17/21 144 lb (65.3 kg)     GEN:   Frail , middle age female,  HEENT: Normal NECK: No JVD;  Loud right carotid bruit  LYMPHATICS: No lymphadenopathy CARDIAC: RRR, no murmurs, rubs, gallops RESPIRATORY:   Left side,  Markedly reduced breath sound with rhonchi   ABDOMEN: Soft, non-tender, non-distended MUSCULOSKELETAL:  No edema; No deformity  SKIN: legs are wrapped with tight ace wrap .   NEUROLOGIC:  Alert and oriented x 3 PSYCHIATRIC:  Normal affect   ASSESSMENT:    1. Pleural effusion, left    PLAN:       1.    Severe biventricular CHF:  She has established with CHF team .   Cont current meds.  I suspect ( based on exam) that she has a large left pleural effusion  BP is mildly elevated today because she thought she could eat bacon ( the hospital gave her bacon while she was there so she thought it was ok to eat)   2.  Large left pleural effusion- by exam  - will get a PA and lateral CXR.  3.  CKD:  Will need to see nephrology         Medication Adjustments/Labs and Tests Ordered: Current medicines are reviewed at length with the patient today.  Concerns regarding medicines are outlined above.  Orders Placed This  Encounter  Procedures  . DG Chest 2 View   No orders of the defined types were placed in this encounter.   Patient Instructions  Medication Instructions:  Your physician recommends that you continue on your current medications as directed. Please refer to the Current Medication list given to you today.  *If you need a  refill on your cardiac medications before your next appointment, please call your pharmacy*   Lab Work: none If you have labs (blood work) drawn today and your tests are completely normal, you will receive your results only by: Marland Kitchen MyChart Message (if you have MyChart) OR . A paper copy in the mail If you have any lab test that is abnormal or we need to change your treatment, we will call you to review the results.   Testing/Procedures: Your physician recommends that you have a chest xray performed.   [address]    Follow-Up: At [facility], you and your health needs are our priority.  As part of our continuing mission to provide you with exceptional heart care, we have created designated Provider Care Teams.  These Care Teams include your primary Cardiologist (physician) and Advanced Practice Providers (APPs -  Physician Assistants and Nurse Practitioners) who all work together to provide you with the care you need, when you need it.  We recommend signing up for the patient portal called "MyChart".  Sign up information is provided on this After Visit Summary.  MyChart is used to connect with patients for Virtual Visits (Telemedicine).  Patients are able to view lab/test results, encounter notes, upcoming appointments, etc.  Non-urgent messages can be sent to your provider as well.   To learn more about what you can do with MyChart, go to [email or web address].    Your next appointment:   2-3 week(s)  The format for your next appointment:   In person  Provider:   Dr. Haroldine Laws  Other Instructions none     Signed, Mertie Moores, MD   02/04/2021 5:05 PM    [place]

## 2021-02-04 ENCOUNTER — Other Ambulatory Visit: Payer: Self-pay

## 2021-02-04 ENCOUNTER — Telehealth: Payer: Self-pay | Admitting: Cardiovascular Disease

## 2021-02-04 ENCOUNTER — Ambulatory Visit: Payer: BC Managed Care – PPO | Admitting: Cardiovascular Disease

## 2021-02-04 ENCOUNTER — Encounter: Payer: Self-pay | Admitting: Cardiovascular Disease

## 2021-02-04 VITALS — BP 150/72 | HR 83 | Ht 64.0 in | Wt 121.0 lb

## 2021-02-04 DIAGNOSIS — J90 Pleural effusion, not elsewhere classified: Secondary | ICD-10-CM | POA: Diagnosis not present

## 2021-02-04 DIAGNOSIS — I5043 Acute on chronic combined systolic (congestive) and diastolic (congestive) heart failure: Secondary | ICD-10-CM | POA: Diagnosis not present

## 2021-02-04 NOTE — Telephone Encounter (Signed)
Patient call to say the place they went to to get xray done the machine wasn't working. Patient need to know what to do now. Please advise

## 2021-02-04 NOTE — Patient Instructions (Signed)
Medication Instructions:  Your physician recommends that you continue on your current medications as directed. Please refer to the Current Medication list given to you today.  *If you need a refill on your cardiac medications before your next appointment, please call your pharmacy*   Lab Work: none If you have labs (blood work) drawn today and your tests are completely normal, you will receive your results only by: Marland Kitchen MyChart Message (if you have MyChart) OR . A paper copy in the mail If you have any lab test that is abnormal or we need to change your treatment, we will call you to review the results.   Testing/Procedures: Your physician recommends that you have a chest xray performed.   [address]    Follow-Up: At [facility], you and your health needs are our priority.  As part of our continuing mission to provide you with exceptional heart care, we have created designated Provider Care Teams.  These Care Teams include your primary Cardiologist (physician) and Advanced Practice Providers (APPs -  Physician Assistants and Nurse Practitioners) who all work together to provide you with the care you need, when you need it.  We recommend signing up for the patient portal called "MyChart".  Sign up information is provided on this After Visit Summary.  MyChart is used to connect with patients for Virtual Visits (Telemedicine).  Patients are able to view lab/test results, encounter notes, upcoming appointments, etc.  Non-urgent messages can be sent to your provider as well.   To learn more about what you can do with MyChart, go to [email or web address].    Your next appointment:   2-3 week(s)  The format for your next appointment:   In person  Provider:   Dr. Haroldine Laws  Other Instructions none

## 2021-02-04 NOTE — Telephone Encounter (Signed)
Left detailed message on cell phone voice mail requesting that she return as early as possible tomorrow morning for CXR and if their machine is still not working, that she ask them if there are other [place] locations where she could go. Advised if she is unable to get CXR tomorrow morning, to call our office.

## 2021-02-05 ENCOUNTER — Ambulatory Visit
Admission: RE | Admit: 2021-02-05 | Discharge: 2021-02-05 | Disposition: A | Discharge status: Home / Self Care | Payer: BC Managed Care – PPO | Source: Ambulatory Visit | Attending: Cardiovascular Disease | Admitting: Cardiovascular Disease

## 2021-02-05 DIAGNOSIS — J90 Pleural effusion, not elsewhere classified: Secondary | ICD-10-CM

## 2021-02-05 DIAGNOSIS — R0602 Shortness of breath: Secondary | ICD-10-CM | POA: Diagnosis not present

## 2021-02-07 ENCOUNTER — Other Ambulatory Visit (HOSPITAL_COMMUNITY): Payer: Self-pay | Admitting: Internal Medicine

## 2021-02-07 ENCOUNTER — Telehealth: Payer: Self-pay | Admitting: Family Medicine

## 2021-02-07 ENCOUNTER — Other Ambulatory Visit: Payer: Self-pay | Admitting: Family Medicine

## 2021-02-07 NOTE — Telephone Encounter (Signed)
The patient is requesting a refill for a 30 days supply with 3 refills : pt would like a call when this is done   calcitRIOL (ROCALTROL) 0.25 MCG capsule  isosorbide-hydrALAZINE (BIDIL) 20-37.5 MG tablet  Torsemide 40 MG TABS- pt states she takes three a day.  [id number] [place] [facility] [address] Phone:  [phone]:  [phone]

## 2021-02-08 MED ORDER — ISOSORB DINITRATE-HYDRALAZINE 20-37.5 MG PO TABS: 1.0000 | ORAL_TABLET | Freq: Three times a day (TID) | ORAL | 3 refills | Status: AC

## 2021-02-08 MED ORDER — TORSEMIDE 40 MG PO TABS: 40.0000 mg | ORAL_TABLET | Freq: Every day | ORAL | 3 refills | Status: DC

## 2021-02-08 NOTE — Telephone Encounter (Signed)
Refills sent to pharmacy. 

## 2021-02-15 ENCOUNTER — Other Ambulatory Visit (HOSPITAL_COMMUNITY): Payer: Self-pay

## 2021-02-22 ENCOUNTER — Telehealth: Payer: Self-pay | Admitting: Family Medicine

## 2021-02-22 NOTE — Telephone Encounter (Signed)
Torsemide 40 MG TABS  [id number] [place] [facility] [address] [place]:  [phone] Fax:  [phone]

## 2021-02-25 NOTE — Telephone Encounter (Signed)
Rx was filled & sent in by Cardiology at the end of April for a years worth.

## 2021-03-10 ENCOUNTER — Other Ambulatory Visit: Payer: Self-pay | Admitting: Family Medicine

## 2021-03-12 NOTE — Progress Notes (Signed)
PCP: Dr Volanda Napoleon  Primary Cardiologist: Dr Haroldine Laws   HPI: Beverly Martin a 65 y.o.femalewith a PMH significant for HFrEF, CAD s/p CABG, HTN, T2DM, HLD, CKD IV.  Echo 01/03/21:  EF 20-25% with severe RV dysfunction (previous EF 40-45%). Felt to be mixed ischemic/NNICM but unable to perform coronary angio due to CKD.  Admitted from [facility] on 01/17/21 with marked volume overload/ADHF.Underwent RHC as below which showed marked volume overload with biventricular failure and normal CO.  Milrinone added to augment diuresis. Once euvolemic milrinone stopped. Diuresed 25 pounds. Creatinine improved 3.5 -> 2.9. Discharged on bidil + torsemide 40 mg daily. Discharge weight 115 pounds.  She was called by Nephrology for an appointment but she has not set up the appointment.  Today she returns for HF follow up with her husband. Overall feeling ok. Says God will take her when he is ready.  Says she has good days and bad days. Not moving around much in the house. SOB when she is dressing. Using 1 liter [place] at rest and 2 liters with exertion.  Able to walk in the house. Denies PND/Orthopnea. Appetite poor. No fever or chills. Weight at home 117 pounds. Taking all medications. Currently not working and trying to get disability.   Cardiac Testing  RHC 01/2021  RA = 15 RV = 72/21 PA = 71/22 (40) PCW = 22 (v-wave 29) Fick cardiac output/index = 6.3/3.7 Thermo CO/CI = 4.3/2.6 PVR = 2.9 (Fick) Ao sat = 99% PA sat = 71%. 72% SVC sat = 66% Assessment: 1. Elevated biventricular pressures 2. Normal cardiac output  ECHO 12/2020  - Had ECHO  06/10/2020 LVEF has decreased from 45% to  20-25%, right ventricular systolic function is severely decreased. RV normal   ROS: All systems negative except as listed in HPI, PMH and Problem List.  SH:  Social History   Socioeconomic History  . Marital status: Married    Spouse name: Not on file  . Number of children: Not on file  . Years of education: Not  on file  . Highest education level: Not on file  Occupational History  . Not on file  Tobacco Use  . Smoking status: Never Smoker  . Smokeless tobacco: Never Used  Vaping Use  . Vaping Use: Never used  Substance and Sexual Activity  . Alcohol use: No  . Drug use: No  . Sexual activity: Not Currently  Other Topics Concern  . Not on file  Social History Narrative  . Not on file   Social Determinants of Health   Financial Resource Strain: Low Risk   . Difficulty of Paying Living Expenses: Not hard at all  Food Insecurity: No Food Insecurity  . Worried About Charity fundraiser in the Last Year: Never true  . Ran Out of Food in the Last Year: Never true  Transportation Needs: No Transportation Needs  . Lack of Transportation (Medical): No  . Lack of Transportation (Non-Medical): No  Physical Activity: Insufficiently Active  . Days of Exercise per Week: 2 days  . Minutes of Exercise per Session: 20 min  Stress: Not on file  Social Connections: Not on file  Intimate Partner Violence: Not on file    FH:  Family History  Problem Relation Age of Onset  . Diabetes Mellitus II Mother   . Esophageal cancer Father     Past Medical History:  Diagnosis Date  . CAD (coronary artery disease)   . Diabetes mellitus without complication ([place])   .  Essential hypertension   . Hyperlipidemia     Current Outpatient Medications  Medication Sig Dispense Refill  . aspirin EC 81 MG EC tablet Take 1 tablet (81 mg total) by mouth daily. Swallow whole. 360 tablet 0  . atorvastatin (LIPITOR) 40 MG tablet TAKE 1 TABLET BY MOUTH EVERY DAY 30 tablet 2  . Blood Pressure KIT Use as directed for checking BP daily. 1 kit 0  . calcitRIOL (ROCALTROL) 0.25 MCG capsule TAKE 1 CAPSULE BY MOUTH EVERY DAY 90 capsule 0  . isosorbide-hydrALAZINE (BIDIL) 20-37.5 MG tablet Take 1 tablet by mouth 3 (three) times daily.    . Torsemide 40 MG TABS Take 40 mg by mouth daily. 180 tablet 3   No current  facility-administered medications for this encounter.    Vitals:   03/13/21 1049  BP: (!) 146/72  Pulse: 94  SpO2: 100%  Weight: 53.3 kg (117 lb 9.6 oz)   Wt Readings from Last 3 Encounters:  03/13/21 53.3 kg  02/04/21 54.9 kg  01/27/21 52.3 kg    PHYSICAL EXAM: General:  Well appearing. No resp difficulty HEENT: normal Neck: supple. JVP flat. Carotids 2+ bilaterally; no bruits. No lymphadenopathy or thryomegaly appreciated. Cor: PMI normal. Regular rate & rhythm. No rubs, gallops or murmurs. Lungs: clear Abdomen: soft, nontender, nondistended. No hepatosplenomegaly. No bruits or masses. Good bowel sounds. Extremities: no cyanosis, clubbing, rash, edema Neuro: alert & orientedx3, cranial nerves grossly intact. Moves all 4 extremities w/o difficulty. Affect pleasant.   ECG: SR 94 pbm.    ASSESSMENT & PLAN: 1. Chronic Combined Systolic and Diastolic CHF: - Likely mixed ischemic and NICM, poorly controlled HTN, poor medication adherence - Echo 12/2020 EF down from 45-50% in 2019 to  20-25% RV normal. Not a candidate for LHC with  Creatinine >1.5.  -NYHA III. Volume status stable. Continue torsemide 40 mg daily. Check BMET and BNP.  - GDMT limited by renal function, No ARN with low GFR, no bb with severely low EF and decompensation  No SGLT2i with h/o urosepsis and renal funciton.  - Continue Bidil 1 tab tid  2. T2DM: -11/2020 Hgb A1C 6.2  - Has not been checking blood sugar.  - not a candidate for SGLT2i with renal fx and hx of urosepsis  3. HTN: -Stable.  -Bidil cut back and systolic bp has been >257,  4.  CKD Stage 4: - Creatinine baseline 2.8-3  - She was referred to Nephrology and was called for an appointment but she did not schedule. Asked her to call Nephrology today.  -Check BMET today.  - Discussed that she needs to avoid NSAIDs.    Follow up in 3-4 weeks. Check BMET/BNP today .   Amanii Snethen NP-C  11:55 AM

## 2021-03-13 ENCOUNTER — Other Ambulatory Visit: Payer: Self-pay

## 2021-03-13 ENCOUNTER — Ambulatory Visit (HOSPITAL_BASED_OUTPATIENT_CLINIC_OR_DEPARTMENT_OTHER)
Admission: RE | Admit: 2021-03-13 | Discharge: 2021-03-13 | Disposition: A | Discharge status: Home / Self Care | Payer: BC Managed Care – PPO | Source: Ambulatory Visit | Attending: Adult Health | Admitting: Adult Health

## 2021-03-13 ENCOUNTER — Encounter (HOSPITAL_COMMUNITY): Payer: Self-pay

## 2021-03-13 VITALS — BP 146/72 | HR 94 | Wt 117.6 lb

## 2021-03-13 DIAGNOSIS — Z833 Family history of diabetes mellitus: Secondary | ICD-10-CM | POA: Insufficient documentation

## 2021-03-13 DIAGNOSIS — N184 Chronic kidney disease, stage 4 (severe): Secondary | ICD-10-CM

## 2021-03-13 DIAGNOSIS — Z79899 Other long term (current) drug therapy: Secondary | ICD-10-CM | POA: Insufficient documentation

## 2021-03-13 DIAGNOSIS — Z681 Body mass index (BMI) 19 or less, adult: Secondary | ICD-10-CM | POA: Diagnosis not present

## 2021-03-13 DIAGNOSIS — Z20822 Contact with and (suspected) exposure to covid-19: Secondary | ICD-10-CM | POA: Diagnosis not present

## 2021-03-13 DIAGNOSIS — I5042 Chronic combined systolic (congestive) and diastolic (congestive) heart failure: Secondary | ICD-10-CM | POA: Insufficient documentation

## 2021-03-13 DIAGNOSIS — I517 Cardiomegaly: Secondary | ICD-10-CM | POA: Diagnosis not present

## 2021-03-13 DIAGNOSIS — N179 Acute kidney failure, unspecified: Secondary | ICD-10-CM | POA: Diagnosis not present

## 2021-03-13 DIAGNOSIS — E785 Hyperlipidemia, unspecified: Secondary | ICD-10-CM | POA: Diagnosis not present

## 2021-03-13 DIAGNOSIS — I5082 Biventricular heart failure: Secondary | ICD-10-CM | POA: Diagnosis not present

## 2021-03-13 DIAGNOSIS — E1122 Type 2 diabetes mellitus with diabetic chronic kidney disease: Secondary | ICD-10-CM | POA: Diagnosis not present

## 2021-03-13 DIAGNOSIS — I130 Hypertensive heart and chronic kidney disease with heart failure and stage 1 through stage 4 chronic kidney disease, or unspecified chronic kidney disease: Secondary | ICD-10-CM | POA: Diagnosis not present

## 2021-03-13 DIAGNOSIS — R630 Anorexia: Secondary | ICD-10-CM | POA: Diagnosis not present

## 2021-03-13 DIAGNOSIS — I5022 Chronic systolic (congestive) heart failure: Secondary | ICD-10-CM | POA: Diagnosis not present

## 2021-03-13 DIAGNOSIS — Z951 Presence of aortocoronary bypass graft: Secondary | ICD-10-CM | POA: Diagnosis not present

## 2021-03-13 DIAGNOSIS — I2510 Atherosclerotic heart disease of native coronary artery without angina pectoris: Secondary | ICD-10-CM | POA: Insufficient documentation

## 2021-03-13 DIAGNOSIS — I10 Essential (primary) hypertension: Secondary | ICD-10-CM | POA: Diagnosis not present

## 2021-03-13 DIAGNOSIS — E873 Alkalosis: Secondary | ICD-10-CM | POA: Diagnosis not present

## 2021-03-13 DIAGNOSIS — I5023 Acute on chronic systolic (congestive) heart failure: Secondary | ICD-10-CM | POA: Diagnosis not present

## 2021-03-13 DIAGNOSIS — R739 Hyperglycemia, unspecified: Secondary | ICD-10-CM | POA: Diagnosis not present

## 2021-03-13 DIAGNOSIS — Z7982 Long term (current) use of aspirin: Secondary | ICD-10-CM | POA: Insufficient documentation

## 2021-03-13 DIAGNOSIS — R251 Tremor, unspecified: Secondary | ICD-10-CM | POA: Diagnosis not present

## 2021-03-13 DIAGNOSIS — J90 Pleural effusion, not elsewhere classified: Secondary | ICD-10-CM | POA: Diagnosis not present

## 2021-03-13 DIAGNOSIS — E875 Hyperkalemia: Secondary | ICD-10-CM | POA: Diagnosis not present

## 2021-03-13 DIAGNOSIS — I509 Heart failure, unspecified: Secondary | ICD-10-CM | POA: Diagnosis not present

## 2021-03-13 DIAGNOSIS — W19XXXA Unspecified fall, initial encounter: Secondary | ICD-10-CM | POA: Diagnosis present

## 2021-03-13 DIAGNOSIS — R0602 Shortness of breath: Secondary | ICD-10-CM | POA: Diagnosis not present

## 2021-03-13 DIAGNOSIS — I491 Atrial premature depolarization: Secondary | ICD-10-CM | POA: Insufficient documentation

## 2021-03-13 DIAGNOSIS — I5043 Acute on chronic combined systolic (congestive) and diastolic (congestive) heart failure: Secondary | ICD-10-CM | POA: Diagnosis not present

## 2021-03-13 DIAGNOSIS — I110 Hypertensive heart disease with heart failure: Secondary | ICD-10-CM | POA: Diagnosis not present

## 2021-03-13 DIAGNOSIS — E1165 Type 2 diabetes mellitus with hyperglycemia: Secondary | ICD-10-CM | POA: Diagnosis not present

## 2021-03-13 DIAGNOSIS — R059 Cough, unspecified: Secondary | ICD-10-CM | POA: Diagnosis not present

## 2021-03-13 LAB — BASIC METABOLIC PANEL
Anion gap: 6 (ref 5–15)
BUN: 58 mg/dL — ABNORMAL HIGH (ref 8–23)
CO2: 43 mmol/L — ABNORMAL HIGH (ref 22–32)
Calcium: 9.5 mg/dL (ref 8.9–10.3)
Chloride: 87 mmol/L — ABNORMAL LOW (ref 98–111)
Creatinine, Ser: 2.9 mg/dL — ABNORMAL HIGH (ref 0.44–1.00)
GFR, Estimated: 17 mL/min — ABNORMAL LOW (ref >60)
Glucose, Bld: 373 mg/dL — ABNORMAL HIGH (ref 70–99)
Potassium: 5.7 mmol/L — ABNORMAL HIGH (ref 3.5–5.1)
Sodium: 136 mmol/L (ref 135–145)

## 2021-03-13 LAB — BRAIN NATRIURETIC PEPTIDE: B Natriuretic Peptide: >4500 pg/mL — ABNORMAL HIGH (ref 0.0–100.0)

## 2021-03-13 NOTE — Patient Instructions (Signed)
Labs today We will only contact you if something comes back abnormal or we need to make some changes. Otherwise no news is good news!  Please call your primary doctor and the [place] [facility] office to schedule an appointment.  It is very important that you are seen soon.  Your physician recommends that you schedule a follow-up appointment in: 4 weeks with the Nurse Practitioner/Physician Assistant and in 8 weeks with Dr Haroldine Laws.  Please call office at [phone] option 2 if you have any questions or concerns.   At the [facility], you and your health needs are our priority. As part of our continuing mission to provide you with exceptional heart care, we have created designated Provider Care Teams. These Care Teams include your primary Cardiologist (physician) and Advanced Practice Providers (APPs- Physician Assistants and Nurse Practitioners) who all work together to provide you with the care you need, when you need it.   You may see any of the following providers on your designated Care Team at your next follow up: Marland Kitchen Dr Glori Bickers . Dr Loralie Champagne . Dr Vickki Muff . Darrick Grinder, NP . Lyda Jester, [place] . Audry Riles, PharmD   Please be sure to bring in all your medications bottles to every appointment.

## 2021-03-14 ENCOUNTER — Inpatient Hospital Stay (HOSPITAL_COMMUNITY)
Admission: EM | Admit: 2021-03-14 | Discharge: 2021-03-15 | DRG: 291 | Disposition: A | Discharge status: Home / Self Care | Payer: BC Managed Care – PPO | Attending: Internal Medicine | Admitting: Internal Medicine

## 2021-03-14 ENCOUNTER — Emergency Department (HOSPITAL_COMMUNITY): Payer: BC Managed Care – PPO

## 2021-03-14 DIAGNOSIS — E875 Hyperkalemia: Secondary | ICD-10-CM | POA: Diagnosis present

## 2021-03-14 DIAGNOSIS — I509 Heart failure, unspecified: Secondary | ICD-10-CM

## 2021-03-14 DIAGNOSIS — N184 Chronic kidney disease, stage 4 (severe): Secondary | ICD-10-CM | POA: Diagnosis present

## 2021-03-14 DIAGNOSIS — I5082 Biventricular heart failure: Secondary | ICD-10-CM | POA: Diagnosis present

## 2021-03-14 DIAGNOSIS — Z951 Presence of aortocoronary bypass graft: Secondary | ICD-10-CM | POA: Diagnosis not present

## 2021-03-14 DIAGNOSIS — I5043 Acute on chronic combined systolic (congestive) and diastolic (congestive) heart failure: Secondary | ICD-10-CM | POA: Diagnosis present

## 2021-03-14 DIAGNOSIS — R251 Tremor, unspecified: Secondary | ICD-10-CM | POA: Diagnosis present

## 2021-03-14 DIAGNOSIS — N179 Acute kidney failure, unspecified: Secondary | ICD-10-CM | POA: Diagnosis present

## 2021-03-14 DIAGNOSIS — E873 Alkalosis: Secondary | ICD-10-CM | POA: Diagnosis present

## 2021-03-14 DIAGNOSIS — E1165 Type 2 diabetes mellitus with hyperglycemia: Secondary | ICD-10-CM | POA: Diagnosis present

## 2021-03-14 DIAGNOSIS — I130 Hypertensive heart and chronic kidney disease with heart failure and stage 1 through stage 4 chronic kidney disease, or unspecified chronic kidney disease: Secondary | ICD-10-CM | POA: Diagnosis present

## 2021-03-14 DIAGNOSIS — Z7982 Long term (current) use of aspirin: Secondary | ICD-10-CM | POA: Diagnosis not present

## 2021-03-14 DIAGNOSIS — I5023 Acute on chronic systolic (congestive) heart failure: Secondary | ICD-10-CM | POA: Diagnosis not present

## 2021-03-14 DIAGNOSIS — R739 Hyperglycemia, unspecified: Secondary | ICD-10-CM

## 2021-03-14 DIAGNOSIS — R630 Anorexia: Secondary | ICD-10-CM | POA: Diagnosis present

## 2021-03-14 DIAGNOSIS — W19XXXA Unspecified fall, initial encounter: Secondary | ICD-10-CM | POA: Diagnosis present

## 2021-03-14 DIAGNOSIS — Z833 Family history of diabetes mellitus: Secondary | ICD-10-CM

## 2021-03-14 DIAGNOSIS — Z79899 Other long term (current) drug therapy: Secondary | ICD-10-CM | POA: Diagnosis not present

## 2021-03-14 DIAGNOSIS — Z20822 Contact with and (suspected) exposure to covid-19: Secondary | ICD-10-CM | POA: Diagnosis present

## 2021-03-14 DIAGNOSIS — E785 Hyperlipidemia, unspecified: Secondary | ICD-10-CM | POA: Diagnosis present

## 2021-03-14 DIAGNOSIS — I2510 Atherosclerotic heart disease of native coronary artery without angina pectoris: Secondary | ICD-10-CM | POA: Diagnosis present

## 2021-03-14 DIAGNOSIS — E1122 Type 2 diabetes mellitus with diabetic chronic kidney disease: Secondary | ICD-10-CM | POA: Diagnosis present

## 2021-03-14 DIAGNOSIS — Z681 Body mass index (BMI) 19 or less, adult: Secondary | ICD-10-CM

## 2021-03-14 LAB — IRON AND TIBC
Iron: 39 ug/dL (ref 28–170)
Saturation Ratios: 18 % (ref 10.4–31.8)
TIBC: 214 ug/dL — ABNORMAL LOW (ref 250–450)
UIBC: 175 ug/dL

## 2021-03-14 LAB — RETICULOCYTES
Immature Retic Fract: 6.8 % (ref 2.3–15.9)
RBC.: 2.71 MIL/uL — ABNORMAL LOW (ref 3.87–5.11)
Retic Count, Absolute: 42.3 10*3/uL (ref 19.0–186.0)
Retic Ct Pct: 1.6 % (ref 0.4–3.1)

## 2021-03-14 LAB — RESP PANEL BY RT-PCR (FLU A&B, COVID) ARPGX2
Influenza A by PCR: NEGATIVE
Influenza B by PCR: NEGATIVE
SARS Coronavirus 2 by RT PCR: NEGATIVE

## 2021-03-14 LAB — CBC
HCT: 27.6 % — ABNORMAL LOW (ref 36.0–46.0)
Hemoglobin: 8.5 g/dL — ABNORMAL LOW (ref 12.0–15.0)
MCH: 30.4 pg (ref 26.0–34.0)
MCHC: 30.8 g/dL (ref 30.0–36.0)
MCV: 98.6 fL (ref 80.0–100.0)
Platelets: 186 10*3/uL (ref 150–400)
RBC: 2.8 MIL/uL — ABNORMAL LOW (ref 3.87–5.11)
RDW: 12.3 % (ref 11.5–15.5)
WBC: 7.7 10*3/uL (ref 4.0–10.5)
nRBC: 0 % (ref 0.0–0.2)

## 2021-03-14 LAB — CBG MONITORING, ED: Glucose-Capillary: 409 mg/dL — ABNORMAL HIGH (ref 70–99)

## 2021-03-14 LAB — COMPREHENSIVE METABOLIC PANEL
ALT: 11 U/L (ref 0–44)
AST: 12 U/L — ABNORMAL LOW (ref 15–41)
Albumin: 3.8 g/dL (ref 3.5–5.0)
Alkaline Phosphatase: 75 U/L (ref 38–126)
Anion gap: 8 (ref 5–15)
BUN: 59 mg/dL — ABNORMAL HIGH (ref 8–23)
CO2: 41 mmol/L — ABNORMAL HIGH (ref 22–32)
Calcium: 9.6 mg/dL (ref 8.9–10.3)
Chloride: 87 mmol/L — ABNORMAL LOW (ref 98–111)
Creatinine, Ser: 3.13 mg/dL — ABNORMAL HIGH (ref 0.44–1.00)
GFR, Estimated: 16 mL/min — ABNORMAL LOW (ref >60)
Glucose, Bld: 421 mg/dL — ABNORMAL HIGH (ref 70–99)
Potassium: 5.2 mmol/L — ABNORMAL HIGH (ref 3.5–5.1)
Sodium: 136 mmol/L (ref 135–145)
Total Bilirubin: 0.9 mg/dL (ref 0.3–1.2)
Total Protein: 6.8 g/dL (ref 6.5–8.1)

## 2021-03-14 LAB — FERRITIN: Ferritin: 161 ng/mL (ref 11–307)

## 2021-03-14 LAB — GLUCOSE, CAPILLARY
Glucose-Capillary: 294 mg/dL — ABNORMAL HIGH (ref 70–99)
Glucose-Capillary: 210 mg/dL — ABNORMAL HIGH (ref 70–99)

## 2021-03-14 LAB — BRAIN NATRIURETIC PEPTIDE: B Natriuretic Peptide: >4500 pg/mL — ABNORMAL HIGH (ref 0.0–100.0)

## 2021-03-14 MED ORDER — SODIUM CHLORIDE 0.9% FLUSH: 3.0000 mL | INTRAVENOUS | Status: DC | PRN

## 2021-03-14 MED ORDER — ATORVASTATIN CALCIUM 40 MG PO TABS
40.0000 mg | ORAL_TABLET | Freq: Every day | ORAL | Status: DC
  Administered 2021-03-15: 40 mg via ORAL
  Filled 2021-03-14: qty 1

## 2021-03-14 MED ORDER — FUROSEMIDE 10 MG/ML IJ SOLN
80.0000 mg | Freq: Two times a day (BID) | INTRAMUSCULAR | Status: DC
  Administered 2021-03-14 – 2021-03-15 (×2): 80 mg via INTRAVENOUS
  Filled 2021-03-14 (×2): qty 8

## 2021-03-14 MED ORDER — FUROSEMIDE 10 MG/ML IJ SOLN
40.0000 mg | Freq: Once | INTRAMUSCULAR | Status: AC
  Administered 2021-03-14: 40 mg via INTRAVENOUS
  Filled 2021-03-14: qty 4

## 2021-03-14 MED ORDER — SODIUM CHLORIDE 0.9 % IV BOLUS
500.0000 mL | Freq: Once | INTRAVENOUS | Status: AC
  Administered 2021-03-14: 500 mL via INTRAVENOUS

## 2021-03-14 MED ORDER — HEPARIN SODIUM (PORCINE) 5000 UNIT/ML IJ SOLN
5000.0000 [IU] | Freq: Three times a day (TID) | INTRAMUSCULAR | Status: DC
  Administered 2021-03-14 – 2021-03-15 (×3): 5000 [IU] via SUBCUTANEOUS
  Filled 2021-03-14 (×3): qty 1

## 2021-03-14 MED ORDER — FUROSEMIDE 10 MG/ML IJ SOLN: 40.0000 mg | Freq: Every day | INTRAMUSCULAR | Status: DC

## 2021-03-14 MED ORDER — INSULIN ASPART 100 UNIT/ML IJ SOLN
10.0000 [IU] | Freq: Once | INTRAMUSCULAR | Status: AC
  Administered 2021-03-14: 10 [IU] via INTRAVENOUS

## 2021-03-14 MED ORDER — ISOSORB DINITRATE-HYDRALAZINE 20-37.5 MG PO TABS
1.0000 | ORAL_TABLET | Freq: Three times a day (TID) | ORAL | Status: DC
  Administered 2021-03-14 – 2021-03-15 (×3): 1 via ORAL
  Filled 2021-03-14 (×3): qty 1

## 2021-03-14 MED ORDER — INSULIN ASPART 100 UNIT/ML IJ SOLN
0.0000 [IU] | Freq: Three times a day (TID) | INTRAMUSCULAR | Status: DC
  Administered 2021-03-14: 5 [IU] via SUBCUTANEOUS
  Administered 2021-03-15: 2 [IU] via SUBCUTANEOUS
  Administered 2021-03-15: 1 [IU] via SUBCUTANEOUS

## 2021-03-14 MED ORDER — ASPIRIN 81 MG PO CHEW
81.0000 mg | CHEWABLE_TABLET | Freq: Every day | ORAL | Status: DC
  Administered 2021-03-15: 81 mg via ORAL
  Filled 2021-03-14: qty 1

## 2021-03-14 MED ORDER — DARBEPOETIN ALFA 25 MCG/0.42ML IJ SOSY
25.0000 ug | PREFILLED_SYRINGE | Freq: Once | INTRAMUSCULAR | Status: AC
  Administered 2021-03-14: 25 ug via SUBCUTANEOUS
  Filled 2021-03-14: qty 0.42

## 2021-03-14 MED ORDER — SODIUM ZIRCONIUM CYCLOSILICATE 10 G PO PACK
10.0000 g | PACK | Freq: Once | ORAL | Status: AC
  Administered 2021-03-14: 10 g via ORAL
  Filled 2021-03-14: qty 1

## 2021-03-14 MED ORDER — SODIUM CHLORIDE 0.9% FLUSH
3.0000 mL | Freq: Two times a day (BID) | INTRAVENOUS | Status: DC
  Administered 2021-03-14: 3 mL via INTRAVENOUS

## 2021-03-14 MED ORDER — SODIUM CHLORIDE 0.9 % IV SOLN: 250.0000 mL | INTRAVENOUS | Status: DC | PRN

## 2021-03-14 MED ORDER — CARVEDILOL 3.125 MG PO TABS
3.1250 mg | ORAL_TABLET | Freq: Two times a day (BID) | ORAL | Status: DC
  Administered 2021-03-14 – 2021-03-15 (×2): 3.125 mg via ORAL
  Filled 2021-03-14 (×2): qty 1

## 2021-03-14 MED ORDER — SODIUM CHLORIDE 0.9 % IV SOLN
510.0000 mg | Freq: Once | INTRAVENOUS | Status: AC
  Administered 2021-03-14: 510 mg via INTRAVENOUS
  Filled 2021-03-14: qty 17

## 2021-03-14 MED ORDER — ONDANSETRON HCL 4 MG/2ML IJ SOLN: 4.0000 mg | Freq: Four times a day (QID) | INTRAMUSCULAR | Status: DC | PRN

## 2021-03-14 MED ORDER — CALCITRIOL 0.25 MCG PO CAPS
0.2500 ug | ORAL_CAPSULE | Freq: Every day | ORAL | Status: DC
  Administered 2021-03-14 – 2021-03-15 (×2): 0.25 ug via ORAL
  Filled 2021-03-14 (×2): qty 1

## 2021-03-14 MED ORDER — ACETAMINOPHEN 325 MG PO TABS: 650.0000 mg | ORAL_TABLET | ORAL | Status: DC | PRN

## 2021-03-14 MED ORDER — ACETAZOLAMIDE 250 MG PO TABS
250.0000 mg | ORAL_TABLET | Freq: Two times a day (BID) | ORAL | Status: DC
  Administered 2021-03-14 – 2021-03-15 (×2): 250 mg via ORAL
  Filled 2021-03-14 (×2): qty 1

## 2021-03-14 NOTE — ED Provider Notes (Addendum)
[facility] EMERGENCY DEPARTMENT Provider Note   CSN: [id number] Arrival date & time: 03/14/21  2703     History Chief Complaint  Patient presents with  . Hyperglycemia    Beverly Martin is a 65 y.o. female.  HPI 65 year old female history of coronary artery disease, diabetes, hypertension, hyperlipidemia with recent admission for CHF presents today with generalized weakness and dyspnea.  She states that she has not been in good health as prior to her hospitalization.  However yesterday she went to a doctor's appointment.  She states that she felt very tired afterwards.  This morning after she got up she felt very generalized weakness.  This caused her to fall to the ground.  She states she had some tremors when attempting to get back up.  Her husband was able to pick her up and put her on the bed and EMS was called.  She reports that EMS put her on the monitor.  She was given the option to come by private vehicle and her husband brought her in the car.  She is normally on 1 L of oxygen but transported without it.  She had increased dyspnea after arrival here.  She denies any pain, headache, neck pain, cough, fever, chills, change in urinary habits or bowel habits.  She states she has been taking less by mouth than she was previously and feels that she may have lost some weight.  She reports being taken off all of her diabetes medications and her blood sugar is elevated    Past Medical History:  Diagnosis Date  . CAD (coronary artery disease)   . Diabetes mellitus without complication ([place])   . Essential hypertension   . Hyperlipidemia     Patient Active Problem List   Diagnosis Date Noted  . Acute on chronic combined systolic and diastolic CHF, NYHA class 3 ([place]) 01/17/2021  . Chronic systolic heart failure ([place]) 01/16/2021  . CKD (chronic kidney disease) stage 4, GFR 15-29 ml/min ([facility]) 01/04/2021  . CHF (congestive heart failure) ([place]) 01/03/2021  . Acute systolic  heart failure ([place]) 06/29/2020  . Acute CHF (congestive heart failure) ([place]) 06/10/2020  . Anasarca 06/10/2020  . Dyslipidemia 06/10/2020  . Positive blood culture 10/21/2017  . UTI (urinary tract infection) 10/20/2017  . Sepsis ([place]) 10/20/2017  . AKI (acute kidney injury) ([place]) 10/20/2017  . Elevated troponin 10/20/2017  . Essential hypertension   . Type 2 diabetes mellitus with hyperlipidemia ([place])   . CAD (coronary artery disease)     Past Surgical History:  Procedure Laterality Date  . CARDIAC SURGERY     CABG  . CORONARY ARTERY BYPASS GRAFT    . RIGHT HEART CATH N/A 01/18/2021   Procedure: RIGHT HEART CATH;  Surgeon: Jolaine Artist, MD;  Location: [facility];  Service: Cardiovascular;  Laterality: N/A;     OB History   No obstetric history on file.     Family History  Problem Relation Age of Onset  . Diabetes Mellitus II Mother   . Esophageal cancer Father     Social History   Tobacco Use  . Smoking status: Never Smoker  . Smokeless tobacco: Never Used  Vaping Use  . Vaping Use: Never used  Substance Use Topics  . Alcohol use: No  . Drug use: No    Home Medications Prior to Admission medications   Medication Sig Start Date End Date Taking? Authorizing Provider  aspirin EC 81 MG EC tablet Take 1  tablet (81 mg total) by mouth daily. Swallow whole. 06/19/20 06/14/21  Kayleen Memos, DO  atorvastatin (LIPITOR) 40 MG tablet TAKE 1 TABLET BY MOUTH EVERY DAY 11/16/20   Billie Ruddy, MD  Blood Pressure KIT Use as directed for checking BP daily. 07/11/20   Billie Ruddy, MD  calcitRIOL (ROCALTROL) 0.25 MCG capsule TAKE 1 CAPSULE BY MOUTH EVERY DAY 02/08/21   Billie Ruddy, MD  isosorbide-hydrALAZINE (BIDIL) 20-37.5 MG tablet Take 1 tablet by mouth 3 (three) times daily.    [provider]  Torsemide 40 MG TABS Take 40 mg by mouth daily. 02/08/21   Bensimhon, Shaune Pascal, MD    Allergies    Patient has no known allergies.  Review of Systems    Review of Systems  All other systems reviewed and are negative.   Physical Exam Updated Vital Signs BP (!) 150/88   Pulse (!) 106   Temp 98.5 F (36.9 C) (Oral)   Resp 16   Ht 1.651 m ($Remove'5\' 5"'AGSkQZX$ )   Wt 53.1 kg   SpO2 100%   BMI 19.47 kg/m   Physical Exam Vitals and nursing note reviewed.  Constitutional:      Appearance: Normal appearance.  HENT:     Head: Normocephalic.     Right Ear: External ear normal.     Left Ear: External ear normal.     Nose: Nose normal.     Mouth/Throat:     Mouth: Mucous membranes are dry.     Pharynx: Oropharynx is clear.  Eyes:     Extraocular Movements: Extraocular movements intact.     Pupils: Pupils are equal, round, and reactive to light.  Cardiovascular:     Rate and Rhythm: Normal rate and regular rhythm.     Pulses: Normal pulses.  Pulmonary:     Effort: Pulmonary effort is normal.     Breath sounds: Rhonchi present.  Abdominal:     General: Bowel sounds are normal.     Palpations: Abdomen is soft.  Musculoskeletal:        General: Normal range of motion.     Cervical back: Normal range of motion.     Right lower leg: No edema.     Left lower leg: No edema.  Skin:    General: Skin is warm.     Capillary Refill: Capillary refill takes less than 2 seconds.  Neurological:     General: No focal deficit present.     Mental Status: She is alert.  Psychiatric:        Mood and Affect: Mood normal.     ED Results / Procedures / Treatments   Labs (all labs ordered are listed, but only abnormal results are displayed) Labs Reviewed  CBG MONITORING, ED - Abnormal; Notable for the following components:      Result Value   Glucose-Capillary 409 (*)    All other components within normal limits    EKG EKG Interpretation  Date/Time:  Thursday March 14 2021 09:19:54 EDT Ventricular Rate:  98 PR Interval:  146 QRS Duration: 107 QT Interval:  340 QTC Calculation: 435 R Axis:   65 Text Interpretation: Sinus or ectopic atrial  tachycardia Atrial premature complex Probable anterior infarct, age indeterminate No significant change since last tracing of June 1,2022 Confirmed by Pattricia Boss [phone]) on 03/14/2021 10:07:02 AM   Radiology DG Chest Port 1 View  Result Date: 03/14/2021 CLINICAL DATA:  Cough and shortness of breath EXAM: PORTABLE CHEST 1  VIEW COMPARISON:  02/05/2021 FINDINGS: Similar cardiomegaly and remote coronary bypass changes. Persistent streaky bibasilar opacities and similar small pleural effusions. No superimposed acute CHF or significant edema pattern. Negative for pneumothorax. Trachea midline. IMPRESSION: Stable cardiomegaly and central vascular congestion Persistent streaky bibasilar atelectasis versus mild airspace disease or pneumonia Similar small pleural effusions. Electronically Signed   By: Jerilynn Mages.  Shick M.D.   On: 03/14/2021 09:59    Procedures .Critical Care Performed by: Pattricia Boss, MD Authorized by: Pattricia Boss, MD   Critical care provider statement:    Critical care time (minutes):  45   Critical care end time:  03/14/2021 12:24 PM   Critical care was necessary to treat or prevent imminent or life-threatening deterioration of the following conditions:  Respiratory failure and renal failure   Critical care was time spent personally by me on the following activities:  Discussions with consultants, evaluation of patient's response to treatment, examination of patient, ordering and performing treatments and interventions, ordering and review of laboratory studies, ordering and review of radiographic studies, pulse oximetry, re-evaluation of patient's condition, obtaining history from patient or surrogate and review of old charts     Medications Ordered in ED Medications - No data to display  ED Course  I have reviewed the triage vital signs and the nursing notes.  Pertinent labs & imaging results that were available during my care of the patient were reviewed by me and considered in my  medical decision making (see chart for details).  Clinical Course as of 03/14/21 1156  Thu Mar 14, 2021  1154 Potassium(!): 5.2 [DR]  1154 Chloride(!): 87 [DR]  1154 CO2(!): 41 [DR]  1154 Glucose(!): 421 [DR]  1154 BUN(!): 59 Hyperglycemia, no evidence of dka, hyperkalemia, hypochloremia, aki with baseline creatinine about 2.9 Anemia with some downward trend  [DR]    Clinical Course User Index [DR] Pattricia Boss, MD   MDM Rules/Calculators/A&P                          65 yo female with near syncope with collapse who has had recent admission for acute on chronic systolic heart failure, esrd, dmt, cad s/p cabg Today with increased creatinine and ongoing elevated bnp and increased cardiac markings c.w. chf.   Last echo 12/06/20 wd 20-25% with severe rv dysfuncion Patient currently on oral torsemide. Elevated bs with patient not taking insulin as prior and no evidence of dka. Insulin given in ED  1- CHF 2- aki on ckd 3-Hyperglycemia 4-multiple electrolyte abnormalities.  Discussed with Dr. Roosevelt Locks who will see for admission Cardiology paged-Discussed with Wannetta Sender, cardiology will see in consult  Final Clinical Impression(s) / ED Diagnoses Final diagnoses:  Congestive heart failure, unspecified HF chronicity, unspecified heart failure type ([place])  Hyperglycemia    Rx / DC Orders ED Discharge Orders    None       Pattricia Boss, MD 03/14/21 1244    Pattricia Boss, MD 03/14/21 1244

## 2021-03-14 NOTE — Consult Note (Addendum)
Advanced Heart Failure Team Consult Note   Primary Physician: Billie Ruddy, MD PCP-Cardiologist:  Mertie Moores, MD AHF: Dr. Haroldine Laws   Reason for Consultation: Acute on chronic systolic heart failure   HPI:    Beverly Martin is seen today for evaluation of acute on chronic systolic heart failure at the request of Dr. Roosevelt Locks, Internal Medicine.   Beverly Bothun Negreteis a 65 y.o.femalewith a PMH significant for HFrEF, CAD s/p CABG, HTN, T2DM, HLD, CKD IV.  Echo 01/03/21: EF 20-25% with severe RV dysfunction (previous EF 40-45%). Mild-mod MR. Felt to be mixed ischemic/NICM but unable to perform coronary angio due to CKD.  Admitted from [facility] on 01/17/21 with marked volume overload/ADHF. Underwent RHC as below which showed marked volume overload with biventricular failure and normal CO.  Milrinone added to augment diuresis. Once euvolemic milrinone stopped. Diuresed 25 pounds. Creatinine improved 3.5 ->2.9. Discharged on bidil + torsemide 40 mg daily. Discharge weight 115 pounds. Advised to f/u w/ nephrology as outpatient.   She presents to the ED today w/ complaint of "shakiness/ tremors" and weak LEs and near fall. Her husband caught her. Denies dizziness. No syncope/ near syncope. I asked her multiple times about dyspnea and she reports Stable NYHA Class II-early III symptoms. She denies any acute change today. Her wt has been stable at home. No LEE but she does report poor appetite. Has been compliant w/ meds. She also reports vague mild chest discomfort, feels like a "band across my chest" but non exertional.   In the ED, was hypertensive and in acute CHF. SBP 150s. BNP >4,500. CXR w/ cardiomegaly and central vascular congestion + small pleural effusions.  Labs: Hgb 8.5, Na 136, K 5.2, CO2 41, Glucose 421, BUN 59, Scr 3.13 (baseline 2.9). COVID negative. She recieved IV Lasix 40 mg x in ED. Reports good Urinary response thus far. Appears comfortable currently. No respiratory  difficulty.    Cardiac Testing  RHC 01/2021  RA = 15 RV = 72/21 PA = 71/22 (40) PCW = 22 (v-wave 29) Fick cardiac output/index = 6.3/3.7 Thermo CO/CI = 4.3/2.6 PVR = 2.9 (Fick) Ao sat = 99% PA sat = 71%. 72% SVC sat = 66% Assessment: 1. Elevated biventricular pressures 2. Normal cardiac output  ECHO 12/2020  - Had ECHO  06/10/2020 LVEF has decreased from 45% to  20-25%, right ventricular systolic function is severely decreased. RV normal      Review of Systems: [y] = yes, [ ]  = no   . General: Weight gain [ ] ; Weight loss [ ] ; Anorexia [ Y]; Fatigue [ ] ; Fever [ ] ; Chills [ ] ; Weakness [Y ]  . Cardiac: Chest pain/pressure [ ] ; Resting SOB [ ] ; Exertional SOB [ Y]; Orthopnea [ ] ; Pedal Edema [ ] ; Palpitations [ ] ; Syncope [ ] ; Presyncope [ ] ; Paroxysmal nocturnal dyspnea[ ]   . Pulmonary: Cough [ ] ; Wheezing[ ] ; Hemoptysis[ ] ; Sputum [ ] ; Snoring [ ]   . GI: Vomiting[ ] ; Dysphagia[ ] ; Melena[ ] ; Hematochezia [ ] ; Heartburn[ ] ; Abdominal pain [ ] ; Constipation [ ] ; Diarrhea [ ] ; BRBPR [ ]   . GU: Hematuria[ ] ; Dysuria [ ] ; Nocturia[ ]   . Vascular: Pain in legs with walking [ ] ; Pain in feet with lying flat [ ] ; Non-healing sores [ ] ; Stroke [ ] ; TIA [ ] ; Slurred speech [ ] ;  . Neuro: Headaches[ ] ; Vertigo[ ] ; Seizures[ ] ; Paresthesias[ ] ;Blurred vision [ ] ; Diplopia [ ] ; Vision changes [ ]   .  Ortho/Skin: Arthritis '[ ]'$ ; Joint pain '[ ]'$ ; Muscle pain '[ ]'$ ; Joint swelling '[ ]'$ ; Back Pain '[ ]'$ ; Rash '[ ]'$   . Psych: Depression'[ ]'$ ; Anxiety'[ ]'$   . Heme: Bleeding problems '[ ]'$ ; Clotting disorders '[ ]'$ ; Anemia [ Y]  . Endocrine: Diabetes [Y ]; Thyroid dysfunction'[ ]'$   Home Medications Prior to Admission medications   Medication Sig Start Date End Date Taking? Authorizing Provider  aspirin EC 81 MG EC tablet Take 1 tablet (81 mg total) by mouth daily. Swallow whole. 06/19/20 06/14/21 Yes Hall, Carole N, DO  atorvastatin (LIPITOR) 40 MG tablet TAKE 1 TABLET BY MOUTH EVERY DAY Patient taking differently:  Take 40 mg by mouth daily. 03/14/21  Yes Billie Ruddy, MD  Blood Pressure KIT Use as directed for checking BP daily. Patient taking differently: 1 each by Other route See admin instructions. Use as directed for checking BP daily. 07/11/20  Yes Billie Ruddy, MD  calcitRIOL (ROCALTROL) 0.25 MCG capsule TAKE 1 CAPSULE BY MOUTH EVERY DAY Patient taking differently: Take 0.25 mcg by mouth daily. 02/08/21  Yes Billie Ruddy, MD  isosorbide-hydrALAZINE (BIDIL) 20-37.5 MG tablet Take 1 tablet by mouth 3 (three) times daily.   Yes [provider]  Torsemide 40 MG TABS Take 40 mg by mouth daily. Patient taking differently: Take 20 mg by mouth in the morning and at bedtime. 02/08/21  Yes Bensimhon, Shaune Pascal, MD    Past Medical History: Past Medical History:  Diagnosis Date  . CAD (coronary artery disease)   . Diabetes mellitus without complication ([place])   . Essential hypertension   . Hyperlipidemia     Past Surgical History: Past Surgical History:  Procedure Laterality Date  . CARDIAC SURGERY     CABG  . CORONARY ARTERY BYPASS GRAFT    . RIGHT HEART CATH N/A 01/18/2021   Procedure: RIGHT HEART CATH;  Surgeon: Jolaine Artist, MD;  Location: [place] [facility];  Service: Cardiovascular;  Laterality: N/A;    Family History: Family History  Problem Relation Age of Onset  . Diabetes Mellitus II Mother   . Esophageal cancer Father     Social History: Social History   Socioeconomic History  . Marital status: Married    Spouse name: Not on file  . Number of children: Not on file  . Years of education: Not on file  . Highest education level: Not on file  Occupational History  . Not on file  Tobacco Use  . Smoking status: Never Smoker  . Smokeless tobacco: Never Used  Vaping Use  . Vaping Use: Never used  Substance and Sexual Activity  . Alcohol use: No  . Drug use: No  . Sexual activity: Not Currently  Other Topics Concern  . Not on file  Social History  Narrative  . Not on file   Social Determinants of Health   Financial Resource Strain: Low Risk   . Difficulty of Paying Living Expenses: Not hard at all  Food Insecurity: No Food Insecurity  . Worried About Charity fundraiser in the Last Year: Never true  . Ran Out of Food in the Last Year: Never true  Transportation Needs: No Transportation Needs  . Lack of Transportation (Medical): No  . Lack of Transportation (Non-Medical): No  Physical Activity: Insufficiently Active  . Days of Exercise per Week: 2 days  . Minutes of Exercise per Session: 20 min  Stress: Not on file  Social Connections: Not on file  Allergies:  No Known Allergies  Objective:    Vital Signs:   Temp:  [98.5 F (36.9 C)] 98.5 F (36.9 C) (06/02 0918) Pulse Rate:  [95-106] 103 (06/02 1230) Resp:  [13-19] 17 (06/02 1230) BP: (148-154)/(76-88) 154/85 (06/02 1230) SpO2:  [100 %] 100 % (06/02 1230) Weight:  [53.1 kg] 53.1 kg (06/02 0914)    Weight change: Filed Weights   03/14/21 0914  Weight: 53.1 kg    Intake/Output:  No intake or output data in the 24 hours ending 03/14/21 1345    Physical Exam    General:  Thin appearing female. No resp difficulty HEENT: normal Neck: supple. JVP elevated to jaw . Carotids 2+ bilat; no bruits. No lymphadenopathy or thyromegaly appreciated. Cor: PMI nondisplaced. Regular rate & rhythm. 2/6 MR murmur  Lungs: decreased BS bilaterally w/ expiratory wheezing/rhonchi  Abdomen: soft, nontender, nondistended. No hepatosplenomegaly. No bruits or masses. Good bowel sounds. Extremities: no cyanosis, clubbing, rash, edema Neuro: alert & orientedx3, cranial nerves grossly intact. moves all 4 extremities w/o difficulty. Affect pleasant   Telemetry   SR w/ PACs 70s   EKG    SR w/ PACs. No significant change from prior study  Labs   Basic Metabolic Panel: Recent Labs  Lab 03/13/21 1143 03/14/21 0934  NA 136 136  K 5.7* 5.2*  CL 87* 87*  CO2 43* 41*   GLUCOSE 373* 421*  BUN 58* 59*  CREATININE 2.90* 3.13*  CALCIUM 9.5 9.6    Liver Function Tests: Recent Labs  Lab 03/14/21 0934  AST 12*  ALT 11  ALKPHOS 75  BILITOT 0.9  PROT 6.8  ALBUMIN 3.8   No results for input(s): LIPASE, AMYLASE in the last 168 hours. No results for input(s): AMMONIA in the last 168 hours.  CBC: Recent Labs  Lab 03/14/21 0934  WBC 7.7  HGB 8.5*  HCT 27.6*  MCV 98.6  PLT 186    Cardiac Enzymes: No results for input(s): CKTOTAL, CKMB, CKMBINDEX, TROPONINI in the last 168 hours.  BNP: BNP (last 3 results) Recent Labs    01/17/21 1006 03/13/21 1143 03/14/21 0934  BNP >4,500.0* >4,500.0* >4,500.0*    ProBNP (last 3 results) Recent Labs    12/06/20 0927  PROBNP >5000.0*     CBG: Recent Labs  Lab 03/14/21 0913  GLUCAP 409*    Coagulation Studies: No results for input(s): LABPROT, INR in the last 72 hours.   Imaging   DG Chest Port 1 View  Result Date: 03/14/2021 CLINICAL DATA:  Cough and shortness of breath EXAM: PORTABLE CHEST 1 VIEW COMPARISON:  02/05/2021 FINDINGS: Similar cardiomegaly and remote coronary bypass changes. Persistent streaky bibasilar opacities and similar small pleural effusions. No superimposed acute CHF or significant edema pattern. Negative for pneumothorax. Trachea midline. IMPRESSION: Stable cardiomegaly and central vascular congestion Persistent streaky bibasilar atelectasis versus mild airspace disease or pneumonia Similar small pleural effusions. Electronically Signed   By: Jerilynn Mages.  Shick M.D.   On: 03/14/2021 09:59      Medications:     Current Medications: . [START ON 03/15/2021] aspirin  81 mg Oral Daily  . [START ON 03/15/2021] atorvastatin  40 mg Oral Daily  . calcitRIOL  0.25 mcg Oral Daily  . carvedilol  3.125 mg Oral BID WC  . [START ON 03/15/2021] furosemide  40 mg Intravenous Daily  . heparin  5,000 Units Subcutaneous Q8H  . insulin aspart  0-9 Units Subcutaneous TID WC  .  isosorbide-hydrALAZINE  1 tablet Oral TID  .  sodium chloride flush  3 mL Intravenous Q12H  . sodium zirconium cyclosilicate  10 g Oral Once     Infusions: . sodium chloride         Assessment/Plan   1. Acute on Chronic Biventricular Heart Failure - Echo 01/03/21: EF 20-25% with severe RV dysfunction (previous EF 40-45%). Mild-mod MR. Felt to be mixed ischemic/NICM but unable to perform coronary angio due to CKD. - 01/17/21 with marked volume overload/ADHF. Underwent RHC which showed marked volume overload with biventricular failure and normal CO.Milrinone added to augment diuresis. - Now readmitted w/ recurrent acute CHF. BNP >4,500 c/w prior baselines. Fluid overloaded on exam. SCr up from baseline, 2.9>>3.1  - IV Lasix, increase to 80 mg bid - can add diamox 250 bid w/ elevated CO2 (41) - monitor urinary response. May need milrinone again to help w/ diuresis  - GDMT limited by CKD IV (no ARNi, sprio/ dig). GFR too low for SGLT2i  - Continue Bidil + Coreg  - consider CardioMEMS   2. HTN - Elevated 099I systolic + fluid overload  - diurese and titrate Bidil  - no  blocker titration given acute CHF decompensation. Can continue Coreg 3.125 bid for now    3. T2DM  - poorly controlled - insulin per IM   4. AKI on Stage IV CKD  - baseline SCr ~2.9  - up to 3.13 today - continue to diurese w/ IV Lasix. May need milrinone to augment diuresis if poor urinary response - follow BMP  - will need to establish care w/ nephrology. Was advised to f/u post hospital 4/22 but no appt yet  5. Hyperkalemia - in setting of AKI on CKD - K 5.7>>Lokelma>>5.2  - IV Lasix + additional Lokelma PRN  - follow BMP   Length of Stay: 0  Brittainy Simmons, PA-C  03/14/2021, 1:45 PM  Advanced Heart Failure Team Pager [phone] (M-F; 7a - 5p)  Please contact [facility] for night-coverage after hours (4p -7a ) and weekends on [email or web address]  Patient seen with PA, agree with the above note.   She  was just seen in clinic yesterday and seemed to be doing well.  Per notes, not very compliant with meds.    She reports "shakiness" and weak legs today, so came to ER.  Blood glucose > 400 and BNP > 4500.  Creatinine near baseline at 3.1 and CXR with vascular congestion.    Patient was admitted for CHF exacerbation.   General: NAD Neck: JVP 12 cm, no thyromegaly or thyroid nodule.  Lungs: Clear to auscultation bilaterally with normal respiratory effort. CV: Nondisplaced PMI.  Heart regular S1/S2, no S3/S4, no murmur.  1+ ankle edema.  No carotid bruit.  Normal pedal pulses.  Abdomen: Soft, nontender, no hepatosplenomegaly, no distention.  Skin: Intact without lesions or rashes.  Neurologic: Alert and oriented x 3.  Psych: Normal affect. Extremities: No clubbing or cyanosis.  HEENT: Normal.   Acute on chronic systolic CHF with baseline severe biventricular failure in the setting of CKD stage IV.  She looks volume overloaded but not markedly so.  Suspect she could use a day or two of diuresis.  - Lasix 80 mg IV bid.  - Agree with Diamox with elevated HCO3.  - Continue home Coreg and Bidil.   - Other GDMT limited by CKD.  - Follow creatinine closely.   Loralie Champagne 03/14/2021 6:45 PM

## 2021-03-14 NOTE — H&P (Addendum)
History and Physical    Beverly Martin FYB:[id number] DOB: 03-07-1956 DOA: 03/14/2021  PCP: Billie Ruddy, MD (Confirm with patient/family/NH records and if not entered, this has to be entered at [facility] point of entry) Patient coming from: Home  I have personally briefly reviewed patient's old medical records in [place]  Chief Complaint: SOB  HPI: Beverly Martin is a 65 y.o. female with medical history significant of chronic diastolic CHF with LVEF 52% March 2022, CKD stage IV, IDDM, HTN, HLD, presented with increasing shortness of breath.  Patient reported that recently for the 2 to 3 months she developed exertional dyspnea, her PCP started her on home O2, on as needed basis 2 L for exercise.  For the last 3 days, she started to have increasing shortness of breath no improvement while on oxygen, she also feels "tightness in the chest", denies any cough, no fever chills no peripheral swelling.  She could not sleep well last night because of shortness of breath, and her appetite has significantly decreased, but denied any nauseous vomiting or abdominal pain.  She was seen by her cardiologist 4 days ago, no medication adjustment on her visit. ED Course: Patient was found to have significant elevation of blood pressure, fluid overload on physical exam, labs showed worsening of CKD.  Chest x-ray showed cardiomegaly and pulmonary congestion.  Review of Systems: As per HPI otherwise 14 point review of systems negative.    Past Medical History:  Diagnosis Date  . CAD (coronary artery disease)   . Diabetes mellitus without complication ([place])   . Essential hypertension   . Hyperlipidemia     Past Surgical History:  Procedure Laterality Date  . CARDIAC SURGERY     CABG  . CORONARY ARTERY BYPASS GRAFT    . RIGHT HEART CATH N/A 01/18/2021   Procedure: RIGHT HEART CATH;  Surgeon: Jolaine Artist, MD;  Location: [place] [facility];  Service: Cardiovascular;  Laterality: N/A;      reports that she has never smoked. She has never used smokeless tobacco. She reports that she does not drink alcohol and does not use drugs.  No Known Allergies  Family History  Problem Relation Age of Onset  . Diabetes Mellitus II Mother   . Esophageal cancer Father      Prior to Admission medications   Medication Sig Start Date End Date Taking? Authorizing Provider  aspirin EC 81 MG EC tablet Take 1 tablet (81 mg total) by mouth daily. Swallow whole. 06/19/20 06/14/21 Yes Hall, Carole N, DO  atorvastatin (LIPITOR) 40 MG tablet TAKE 1 TABLET BY MOUTH EVERY DAY Patient taking differently: Take 40 mg by mouth daily. 03/14/21  Yes Billie Ruddy, MD  Blood Pressure KIT Use as directed for checking BP daily. Patient taking differently: 1 each by Other route See admin instructions. Use as directed for checking BP daily. 07/11/20  Yes Billie Ruddy, MD  calcitRIOL (ROCALTROL) 0.25 MCG capsule TAKE 1 CAPSULE BY MOUTH EVERY DAY Patient taking differently: Take 0.25 mcg by mouth daily. 02/08/21  Yes Billie Ruddy, MD  isosorbide-hydrALAZINE (BIDIL) 20-37.5 MG tablet Take 1 tablet by mouth 3 (three) times daily.   Yes [provider]  Torsemide 40 MG TABS Take 40 mg by mouth daily. Patient taking differently: Take 20 mg by mouth in the morning and at bedtime. 02/08/21  Yes Bensimhon, Shaune Pascal, MD    Physical Exam: Vitals:   03/14/21 0945 03/14/21 1015 03/14/21 1115 03/14/21  1230  BP: (!) 148/76 (!) 151/86 (!) 152/82 (!) 154/85  Pulse: 95 99 (!) 103 (!) 103  Resp: _0 Temp:      TempSrc:      SpO2: 100% 100% 100% 100%  Weight:      Height:        Constitutional: NAD, calm, comfortable Vitals:   03/14/21 0945 03/14/21 1015 03/14/21 1115 03/14/21 1230  BP: (!) 148/76 (!) 151/86 (!) 152/82 (!) 154/85  Pulse: 95 99 (!) 103 (!) 103  Resp: _1 Temp:      TempSrc:      SpO2: 100% 100% 100% 100%  Weight:      Height:       Eyes: PERRL, lids and conjunctivae  normal ENMT: Mucous membranes are moist. Posterior pharynx clear of any exudate or lesions.Normal dentition.  Neck: normal, supple, no masses, no thyromegaly Respiratory: clear to auscultation bilaterally, no wheezing, bilateral fine crackles to the mid level lung fields.  Increasing respiratory effort, talking in broken sentences. No accessory muscle use.  Cardiovascular: Regular rate and rhythm, no murmurs / rubs / gallops. No extremity edema. 2+ pedal pulses. No carotid bruits.  Abdomen: no tenderness, no masses palpated. No hepatosplenomegaly. Bowel sounds positive.  Musculoskeletal: no clubbing / cyanosis. No joint deformity upper and lower extremities. Good ROM, no contractures. Normal muscle tone.  Skin: no rashes, lesions, ulcers. No induration Neurologic: CN 2-12 grossly intact. Sensation intact, DTR normal. Strength 5/5 in all 4.  Psychiatric: Normal judgment and insight. Alert and oriented x 3. Normal mood.     Labs on Admission: I have personally reviewed following labs and imaging studies  CBC: Recent Labs  Lab 03/14/21 0934  WBC 7.7  HGB 8.5*  HCT 27.6*  MCV 98.6  PLT 426   Basic Metabolic Panel: Recent Labs  Lab 03/13/21 1143 03/14/21 0934  NA 136 136  K 5.7* 5.2*  CL 87* 87*  CO2 43* 41*  GLUCOSE 373* 421*  BUN 58* 59*  CREATININE 2.90* 3.13*  CALCIUM 9.5 9.6   GFR: Estimated Creatinine Clearance: 15 mL/min (A) (by C-G formula based on SCr of 3.13 mg/dL (H)). Liver Function Tests: Recent Labs  Lab 03/14/21 0934  AST 12*  ALT 11  ALKPHOS 75  BILITOT 0.9  PROT 6.8  ALBUMIN 3.8   No results for input(s): LIPASE, AMYLASE in the last 168 hours. No results for input(s): AMMONIA in the last 168 hours. Coagulation Profile: No results for input(s): INR, PROTIME in the last 168 hours. Cardiac Enzymes: No results for input(s): CKTOTAL, CKMB, CKMBINDEX, TROPONINI in the last 168 hours. BNP (last 3 results) Recent Labs    12/06/20 0927  PROBNP >5000.0*    HbA1C: No results for input(s): HGBA1C in the last 72 hours. CBG: Recent Labs  Lab 03/14/21 0913  GLUCAP 409*   Lipid Profile: No results for input(s): CHOL, HDL, LDLCALC, TRIG, CHOLHDL, LDLDIRECT in the last 72 hours. Thyroid Function Tests: No results for input(s): TSH, T4TOTAL, FREET4, T3FREE, THYROIDAB in the last 72 hours. Anemia Panel: No results for input(s): VITAMINB12, FOLATE, FERRITIN, TIBC, IRON, RETICCTPCT in the last 72 hours. Urine analysis:    Component Value Date/Time   COLORURINE YELLOW 01/18/2021 2000   APPEARANCEUR CLEAR 01/18/2021 2000   LABSPEC 1.009 01/18/2021 2000   PHURINE 6.0 01/18/2021 2000   GLUCOSEU NEGATIVE 01/18/2021 2000   HGBUR NEGATIVE 01/18/2021 2000   BILIRUBINUR NEGATIVE 01/18/2021 2000   KETONESUR NEGATIVE  01/18/2021 2000   PROTEINUR 30 (A) 01/18/2021 2000   UROBILINOGEN 0.2 10/20/2017 1115   NITRITE NEGATIVE 01/18/2021 2000   LEUKOCYTESUR NEGATIVE 01/18/2021 2000    Radiological Exams on Admission: DG Chest Port 1 View  Result Date: 03/14/2021 CLINICAL DATA:  Cough and shortness of breath EXAM: PORTABLE CHEST 1 VIEW COMPARISON:  02/05/2021 FINDINGS: Similar cardiomegaly and remote coronary bypass changes. Persistent streaky bibasilar opacities and similar small pleural effusions. No superimposed acute CHF or significant edema pattern. Negative for pneumothorax. Trachea midline. IMPRESSION: Stable cardiomegaly and central vascular congestion Persistent streaky bibasilar atelectasis versus mild airspace disease or pneumonia Similar small pleural effusions. Electronically Signed   By: Jerilynn Mages.  Shick M.D.   On: 03/14/2021 09:59    EKG: Independently reviewed.  Sinus, similar ST-T changes as before  Assessment/Plan Active Problems:   CHF (congestive heart failure) ([place])  (please populate well all problems here in Problem List. (For example, if patient is on BP meds at home and you resume or decide to hold them, it is a problem that needs to be  her. Same for CAD, COPD, HLD and so on)  Acute on chronic systolic CHF decompensation -Blood pressure significantly elevated, blood pressure reading from yesterday's outpatient record 146/72, probably too high for LVEF 25%.  Continue hydralazine Imdur regimen, add Coreg. -Fluid overload, switch p.o. Lasix to IV Lasix 40 mg daily, monitor kidney function.  AKI on CKD stage IV -Fluid overload, suspect cardiorenal syndrome -IV diuresis for now, monitor kidney function.  Hyperkalemia -secondary to AKI, no EKG changes, one dose of LoKelma given and repeat BMP in AM.  IIDM with Hyperglycemia -Patient claimed that her HbA1c around 8 recently, -Start sliding scale  Metabolic alkalosis -Chronic, suspect concurrent respiratory acidosis, check VBG. -If kidney function stable, will consider Diamox.  Chronic normocytic anemia -Denied any abdominal pain or dark-colored stool, check iron study and reticulocyte count.  If negative for iron deficiency, likely will need periodic EPO infusion.  Uncontrolled hypertension -As above  DVT prophylaxis: Heparin subcu code Status: Full code Family Communication: Husband over the phone Disposition Plan: Expect more than 2 midnight hospital stay for IV diuresis and blood glucose control Consults called: Cardiology Admission status: PCU  Beverly Halt MD [facility] Pager [phone]  03/14/2021, 12:55 PM

## 2021-03-14 NOTE — Progress Notes (Signed)
Iron study normal, reticulocyte count low likely from CKD.  1 dose of EPO ordered.  Recommend outpatient follow-up with nephrologist for EPO infusion in the future.

## 2021-03-14 NOTE — ED Triage Notes (Signed)
Pt to ED c/o high blood sugar. Reports blood sugar of 458 this morning. Reports she was taken off her insulin 3 weeks ago and has felt bad since. Reports has not been checking sugars at home since taken off insulin, but does take PO as prescribed. Denies pain .

## 2021-03-14 NOTE — Progress Notes (Signed)
Inpatient Diabetes Program Recommendations  AACE/ADA: New Consensus Statement on Inpatient Glycemic Control (2015)  Target Ranges:  Prepandial:   less than 140 mg/dL      Peak postprandial:   less than 180 mg/dL (1-2 hours)      Critically ill patients:  140 - 180 mg/dL   Lab Results  Component Value Date   GLUCAP 409 (H) 03/14/2021   HGBA1C 6.4 12/06/2020    Review of Glycemic Control Results for AKHIA, CARRAS (MRN [id number]) as of 03/14/2021 12:12  Ref. Range 01/27/2021 14:31 03/14/2021 09:13  Glucose-Capillary Latest Ref Range: 70 - 99 mg/dL 331 (H) 409 (H)   Diabetes history: Type 2 DM Outpatient Diabetes medications: none Current orders for Inpatient glycemic control: Novolog 10 units x 1  Inpatient Diabetes Program Recommendations:    Per RN triage, patient reports being taken off insulin three weeks ago. However, per chart review and previous hospitalizations (12/14/2020 & 01/17/2021), no note of insulin on med reconciliation. Of note, patient was experiencing lows from Amaryl and since was discontinued.   Consider Novolog 0-6 units Q4H.  Thanks, Bronson Curb, MSN, RNC-OB Diabetes Coordinator [phone] (8a-5p)

## 2021-03-15 ENCOUNTER — Encounter (HOSPITAL_COMMUNITY): Payer: Self-pay | Admitting: *Deleted

## 2021-03-15 ENCOUNTER — Other Ambulatory Visit (HOSPITAL_COMMUNITY): Payer: Self-pay

## 2021-03-15 DIAGNOSIS — I5023 Acute on chronic systolic (congestive) heart failure: Secondary | ICD-10-CM

## 2021-03-15 LAB — BASIC METABOLIC PANEL
Anion gap: 6 (ref 5–15)
BUN: 58 mg/dL — ABNORMAL HIGH (ref 8–23)
CO2: 41 mmol/L — ABNORMAL HIGH (ref 22–32)
Calcium: 9.2 mg/dL (ref 8.9–10.3)
Chloride: 93 mmol/L — ABNORMAL LOW (ref 98–111)
Creatinine, Ser: 2.94 mg/dL — ABNORMAL HIGH (ref 0.44–1.00)
GFR, Estimated: 17 mL/min — ABNORMAL LOW (ref >60)
Glucose, Bld: 143 mg/dL — ABNORMAL HIGH (ref 70–99)
Potassium: 4.6 mmol/L (ref 3.5–5.1)
Sodium: 140 mmol/L (ref 135–145)

## 2021-03-15 LAB — HEMOGLOBIN A1C
Hgb A1c MFr Bld: 9.1 % — ABNORMAL HIGH (ref 4.8–5.6)
Mean Plasma Glucose: 214 mg/dL

## 2021-03-15 LAB — GLUCOSE, CAPILLARY
Glucose-Capillary: 144 mg/dL — ABNORMAL HIGH (ref 70–99)
Glucose-Capillary: 164 mg/dL — ABNORMAL HIGH (ref 70–99)
Glucose-Capillary: 153 mg/dL — ABNORMAL HIGH (ref 70–99)

## 2021-03-15 MED ORDER — ISOSORB DINITRATE-HYDRALAZINE 20-37.5 MG PO TABS
2.0000 | ORAL_TABLET | Freq: Three times a day (TID) | ORAL | 0 refills | Status: DC
  Filled 2021-03-15: qty 180, 30d supply, fill #0

## 2021-03-15 MED ORDER — ISOSORB DINITRATE-HYDRALAZINE 20-37.5 MG PO TABS: 2.0000 | ORAL_TABLET | Freq: Three times a day (TID) | ORAL | Status: DC

## 2021-03-15 MED ORDER — TORSEMIDE 20 MG PO TABS: 40.0000 mg | ORAL_TABLET | Freq: Every day | ORAL | Status: DC

## 2021-03-15 MED ORDER — HYDRALAZINE HCL 25 MG PO TABS
25.0000 mg | ORAL_TABLET | Freq: Three times a day (TID) | ORAL | 1 refills | Status: DC
  Filled 2021-03-15: qty 90, 30d supply, fill #0

## 2021-03-15 MED ORDER — ATORVASTATIN CALCIUM 40 MG PO TABS
40.0000 mg | ORAL_TABLET | Freq: Every day | ORAL | 0 refills | Status: DC
  Filled 2021-03-15: qty 30, 30d supply, fill #0

## 2021-03-15 MED ORDER — ISOSORBIDE MONONITRATE ER 60 MG PO TB24
60.0000 mg | ORAL_TABLET | Freq: Every day | ORAL | 1 refills | Status: DC
  Filled 2021-03-15: qty 30, 30d supply, fill #0

## 2021-03-15 NOTE — Discharge Summary (Addendum)
Physician Discharge Summary  Beverly FERRAIOLO CWC:[id number] DOB: [birth year] DOA: 03/14/2021  PCP: Billie Ruddy, MD  Admit date: 03/14/2021 Discharge date: 03/15/2021  Admitted From: home Discharge disposition: home   Recommendations for Outpatient Follow-Up:   1. Bring log of blood sugars to PCP for medication adjustment 2. Outpatient palliative care referral 3. Declined home health 4. will need to establish care w/ nephrology. Was advised to f/u post hospital 4/22 but no appt yet   Discharge Diagnosis:   Active Problems:   CHF (congestive heart failure) ([place])    Discharge Condition: Improved.  Diet recommendation: Low sodium, heart healthy  Wound care: None.  Code status: Full.   History of Present Illness:   Beverly Martin is a 65 y.o. female with medical history significant of chronic diastolic CHF with LVEF 76% March 2022, CKD stage IV, IDDM, HTN, HLD, presented with increasing shortness of breath.  Patient reported that recently for the 2 to 3 months she developed exertional dyspnea, her PCP started her on home O2, on as needed basis 2 L for exercise.  For the last 3 days, she started to have increasing shortness of breath no improvement while on oxygen, she also feels "tightness in the chest", denies any cough, no fever chills no peripheral swelling.  She could not sleep well last night because of shortness of breath, and her appetite has significantly decreased, but denied any nauseous vomiting or abdominal pain.  She was seen by her cardiologist 4 days ago, no medication adjustment on her visit.   Hospital Course by Problem:   Acute on Chronic Biventricular Heart Failure -Echo 01/03/21: EF 20-25% with severe RV dysfunction (previous EF 40-45%).Mild-mod MR.Felt to be mixed ischemic/NICM but unable to perform coronary angio due to CKD. -01/17/21 with marked volume overload/ADHF. Underwent RHC which showed marked volume overload with biventricular failure  and normal CO.Milrinone added to augment diuresis. -concern about low output HF stop carvedilol - Home HF regimen: Bidil 2 tabs tid (increased dose)-- split the components Torsemide 40 daily No ACE/ARB/spiro with CKD IV and hyperkalemia -improved faster than expected   HTN - Increasing Bidil (due to cost split the components)  T2DM  - poorly controlled - outpatient follow up with PCP for adjustments  AKI on Stage IV CKD  - baseline SCr ~2.9  - up to 3.13 on amdit - will need to establish care w/ nephrology. Was advised to f/u post hospital 4/22 but no appt yet  Hyperkalemia - in setting of AKI on CKD -s/p lokelma - resolved    Medical Consultants:   chf team   Discharge Exam:   Vitals:   03/15/21 0500 03/15/21 0700  BP: 132/70 139/74  Pulse: 75 82  Resp: 20 20  Temp: 98 F (36.7 C) 98.2 F (36.8 C)  SpO2: 100% 95%   Vitals:   03/15/21 0300 03/15/21 0500 03/15/21 0632 03/15/21 0700  BP: 109/66 132/70  139/74  Pulse: 76 75  82  Resp: _0 Temp: 98 F (36.7 C) 98 F (36.7 C)  98.2 F (36.8 C)  TempSrc: Oral Oral  Oral  SpO2: 100% 100%  95%  Weight:   52.1 kg   Height:        General exam: Appears calm and comfortable.    The results of significant diagnostics from this hospitalization (including imaging, microbiology, ancillary and laboratory) are listed below for reference.     Procedures and Diagnostic Studies:  DG Chest Port 1 View  Result Date: 03/14/2021 CLINICAL DATA:  Cough and shortness of breath EXAM: PORTABLE CHEST 1 VIEW COMPARISON:  02/05/2021 FINDINGS: Similar cardiomegaly and remote coronary bypass changes. Persistent streaky bibasilar opacities and similar small pleural effusions. No superimposed acute CHF or significant edema pattern. Negative for pneumothorax. Trachea midline. IMPRESSION: Stable cardiomegaly and central vascular congestion Persistent streaky bibasilar atelectasis versus mild airspace disease or pneumonia  Similar small pleural effusions. Electronically Signed   By: Jerilynn Mages.  Shick M.D.   On: 03/14/2021 09:59     Labs:   Basic Metabolic Panel: Recent Labs  Lab 03/13/21 1143 03/14/21 0934 03/15/21 0155  NA 136 136 140  K 5.7* 5.2* 4.6  CL 87* 87* 93*  CO2 43* 41* 41*  GLUCOSE 373* 421* 143*  BUN 58* 59* 58*  CREATININE 2.90* 3.13* 2.94*  CALCIUM 9.5 9.6 9.2   GFR Estimated Creatinine Clearance: 15.7 mL/min (A) (by C-G formula based on SCr of 2.94 mg/dL (H)). Liver Function Tests: Recent Labs  Lab 03/14/21 0934  AST 12*  ALT 11  ALKPHOS 75  BILITOT 0.9  PROT 6.8  ALBUMIN 3.8   No results for input(s): LIPASE, AMYLASE in the last 168 hours. No results for input(s): AMMONIA in the last 168 hours. Coagulation profile No results for input(s): INR, PROTIME in the last 168 hours.  CBC: Recent Labs  Lab 03/14/21 0934  WBC 7.7  HGB 8.5*  HCT 27.6*  MCV 98.6  PLT 186   Cardiac Enzymes: No results for input(s): CKTOTAL, CKMB, CKMBINDEX, TROPONINI in the last 168 hours. BNP: Invalid input(s): POCBNP CBG: Recent Labs  Lab 03/14/21 1800 03/14/21 2108 03/15/21 0608 03/15/21 0904 03/15/21 1116  GLUCAP 294* 210* 144* 164* 153*   D-Dimer No results for input(s): DDIMER in the last 72 hours. Hgb A1c Recent Labs    03/14/21 1249  HGBA1C 9.1*   Lipid Profile No results for input(s): CHOL, HDL, LDLCALC, TRIG, CHOLHDL, LDLDIRECT in the last 72 hours. Thyroid function studies No results for input(s): TSH, T4TOTAL, T3FREE, THYROIDAB in the last 72 hours.  Invalid input(s): FREET3 Anemia work up Recent Labs    03/14/21 1249  FERRITIN 161  TIBC 214*  IRON 39  RETICCTPCT 1.6   Microbiology Recent Results (from the past 240 hour(s))  Resp Panel by RT-PCR (Flu A&B, Covid) Nasopharyngeal Swab     Status: None   Collection Time: 03/14/21  9:50 AM   Specimen: Nasopharyngeal Swab; Nasopharyngeal(NP) swabs in vial transport medium  Result Value Ref Range Status   SARS  Coronavirus 2 by RT PCR NEGATIVE NEGATIVE Final    Comment: (NOTE) SARS-CoV-2 target nucleic acids are NOT DETECTED.  The SARS-CoV-2 RNA is generally detectable in upper respiratory specimens during the acute phase of infection. The lowest concentration of SARS-CoV-2 viral copies this assay can detect is 138 copies/mL. A negative result does not preclude SARS-Cov-2 infection and should not be used as the sole basis for treatment or other patient management decisions. A negative result may occur with  improper specimen collection/handling, submission of specimen other than nasopharyngeal swab, presence of viral mutation(s) within the areas targeted by this assay, and inadequate number of viral copies(<138 copies/mL). A negative result must be combined with clinical observations, patient history, and epidemiological information. The expected result is Negative.  Fact Sheet for Patients:  EntrepreneurPulse.com.au  Fact Sheet for Healthcare Providers:  IncredibleEmployment.be  This test is no t yet approved or cleared by the [place] and  has been authorized for detection and/or diagnosis of SARS-CoV-2 by FDA under an Emergency Use Authorization (EUA). This EUA will remain  in effect (meaning this test can be used) for the duration of the COVID-19 declaration under Section 564(b)(1) of the Act, 21 U.S.C.section 360bbb-3(b)(1), unless the authorization is terminated  or revoked sooner.       Influenza A by PCR NEGATIVE NEGATIVE Final   Influenza B by PCR NEGATIVE NEGATIVE Final    Comment: (NOTE) The Xpert Xpress SARS-CoV-2/FLU/RSV plus assay is intended as an aid in the diagnosis of influenza from Nasopharyngeal swab specimens and should not be used as a sole basis for treatment. Nasal washings and aspirates are unacceptable for Xpert Xpress SARS-CoV-2/FLU/RSV testing.  Fact Sheet for  Patients: EntrepreneurPulse.com.au  Fact Sheet for Healthcare Providers: IncredibleEmployment.be  This test is not yet approved or cleared by the [place] FDA and has been authorized for detection and/or diagnosis of SARS-CoV-2 by FDA under an Emergency Use Authorization (EUA). This EUA will remain in effect (meaning this test can be used) for the duration of the COVID-19 declaration under Section 564(b)(1) of the Act, 21 U.S.C. section 360bbb-3(b)(1), unless the authorization is terminated or revoked.  Performed at [facility], [place] [address] [place], [place] [address]      Discharge Instructions:   Discharge Instructions    (HEART FAILURE PATIENTS) Call MD:  Anytime you have any of the following symptoms: 1) 3 pound weight gain in 24 hours or 5 pounds in 1 week 2) shortness of breath, with or without a dry hacking cough 3) swelling in the hands, feet or stomach 4) if you have to sleep on extra pillows at night in order to breathe.   Complete by: As directed    Diet - low sodium heart healthy   Complete by: As directed    Heart Failure patients record your daily weight using the same scale at the same time of day   Complete by: As directed    Increase activity slowly   Complete by: As directed      Allergies as of 03/15/2021   No Known Allergies     Medication List    STOP taking these medications   isosorbide-hydrALAZINE 20-37.5 MG tablet Commonly known as: BIDIL     TAKE these medications   aspirin 81 MG EC tablet Take 1 tablet (81 mg total) by mouth daily. Swallow whole.   atorvastatin 40 MG tablet Commonly known as: LIPITOR Take 1 tablet (40 mg total) by mouth daily.   Blood Pressure Kit Use as directed for checking BP daily. What changed:   how much to take  how to take this  when to take this   calcitRIOL 0.25 MCG capsule Commonly known as: ROCALTROL TAKE 1 CAPSULE BY MOUTH EVERY DAY What changed:  how much to take   hydrALAZINE 25 MG tablet Commonly known as: APRESOLINE Take 1 tablet (25 mg total) by mouth 3 (three) times daily.   isosorbide mononitrate 60 MG 24 hr tablet Commonly known as: IMDUR Take 1 tablet (60 mg total) by mouth daily.   Torsemide 40 MG Tabs Take 40 mg by mouth daily. What changed:   how much to take  when to take this            Durable Medical Equipment  (From admission, onward)         Start     Ordered   03/15/21 1231  For home use only  DME Walker rolling  Once       Question Answer Comment  Walker: With 5 Inch Wheels   Patient needs a walker to treat with the following condition Weakness      03/15/21 1231          Follow-up Information    [facility] Follow up on 03/27/2021.   Specialty: Cardiology Why: at 3:30 Contact information: [address] [phone] [place] [phone]               Time coordinating discharge: 35 min  Signed:  Geradine Girt DO  [facility] 03/15/2021, 3:59 PM

## 2021-03-15 NOTE — TOC Transition Note (Signed)
Transition of Care [facility]) - CM/SW Discharge Note   Patient Details  Name: Beverly Martin MRN: [id number] Date of Birth: August 04, 1956  Transition of [facility]) CM/SW Contact:  Ella Bodo, RN Phone Number: 03/15/2021, 12:38 PM   Clinical Narrative:   Pt admitted on 03/14/21 with acute on chronic systolic CHF decompensation.  PTA, pt fairly independent with ADLS; lives with husband, who provides 24h assistance.  PT evaluating presently; recommending [place] follow up and DME for home. Pt declines 3 in 1 and HHPT, but is agreeable to RW.  Referral to [place] for RW, to be delivered to bedside prior to dc.  Patient on home oxygen prior to admission, provided by [employer].  Husband states she has concentrator and portable tanks at home; he has brought a portable tank for her dc today.                    Final next level of care: Home/Self Care Barriers to Discharge: Barriers Resolved   Patient Goals and CMS Choice Patient states their goals for this hospitalization and ongoing recovery are:: to go home                            Discharge Plan and Services   Discharge Planning Services: CM Consult,Other - See comment                                 Social Determinants of Health (SDOH) Interventions     Readmission Risk Interventions Readmission Risk Prevention Plan 03/15/2021 03/15/2021  Transportation Screening Complete Complete  PCP or Specialist Appt within 5-7 Days Complete Complete  Home Care Screening Complete Complete  Medication Review (RN CM) Complete Complete  Some recent data might be hidden   Reinaldo Raddle, RN, BSN  Trauma/Neuro ICU Case Manager [phone]

## 2021-03-15 NOTE — Progress Notes (Signed)
Advanced Heart Failure Rounding Note   Subjective:    Feels weak but denies SOB, orthopnea or PND.   Mild diuresis with IV lasix   Objective:   Weight Range:  Vital Signs:   Temp:  [98 F (36.7 C)-98.5 F (36.9 C)] 98 F (36.7 C) (06/03 0300) Pulse Rate:  [76-106] 76 (06/03 0300) Resp:  [13-22] 17 (06/03 0300) BP: (109-155)/(63-88) 109/66 (06/03 0300) SpO2:  [99 %-100 %] 100 % (06/03 0300) Weight:  [53.1 kg] 53.1 kg (06/02 0914)    Weight change: Filed Weights   03/14/21 0914  Weight: 53.1 kg    Intake/Output:   Intake/Output Summary (Last 24 hours) at 03/15/2021 0508 Last data filed at 03/14/2021 2300 Gross per 24 hour  Intake 100 ml  Output 400 ml  Net -300 ml     Physical Exam: General: Lying flat in bed No resp difficulty HEENT: normal Neck: supple. JVP 7 . Carotids 2+ bilat; no bruits. No lymphadenopathy or thryomegaly appreciated. Cor: PMI nondisplaced. Regular rate & rhythm. No rubs, gallops or murmurs. Lungs: clear Abdomen: soft, nontender, nondistended. No hepatosplenomegaly. No bruits or masses. Good bowel sounds. Extremities: no cyanosis, clubbing, rash, edema Neuro: alert & orientedx3, cranial nerves grossly intact. moves all 4 extremities w/o difficulty. Affect pleasant  Telemetry: NSR 70s   Labs: Basic Metabolic Panel: Recent Labs  Lab 03/13/21 1143 03/14/21 0934 03/15/21 0155  NA 136 136 140  K 5.7* 5.2* 4.6  CL 87* 87* 93*  CO2 43* 41* 41*  GLUCOSE 373* 421* 143*  BUN 58* 59* 58*  CREATININE 2.90* 3.13* 2.94*  CALCIUM 9.5 9.6 9.2    Liver Function Tests: Recent Labs  Lab 03/14/21 0934  AST 12*  ALT 11  ALKPHOS 75  BILITOT 0.9  PROT 6.8  ALBUMIN 3.8   No results for input(s): LIPASE, AMYLASE in the last 168 hours. No results for input(s): AMMONIA in the last 168 hours.  CBC: Recent Labs  Lab 03/14/21 0934  WBC 7.7  HGB 8.5*  HCT 27.6*  MCV 98.6  PLT 186    Cardiac Enzymes: No results for input(s):  CKTOTAL, CKMB, CKMBINDEX, TROPONINI in the last 168 hours.  BNP: BNP (last 3 results) Recent Labs    01/17/21 1006 03/13/21 1143 03/14/21 0934  BNP >4,500.0* >4,500.0* >4,500.0*    ProBNP (last 3 results) Recent Labs    12/06/20 0927  PROBNP >5000.0*      Other results:  Imaging: DG Chest Port 1 View  Result Date: 03/14/2021 CLINICAL DATA:  Cough and shortness of breath EXAM: PORTABLE CHEST 1 VIEW COMPARISON:  02/05/2021 FINDINGS: Similar cardiomegaly and remote coronary bypass changes. Persistent streaky bibasilar opacities and similar small pleural effusions. No superimposed acute CHF or significant edema pattern. Negative for pneumothorax. Trachea midline. IMPRESSION: Stable cardiomegaly and central vascular congestion Persistent streaky bibasilar atelectasis versus mild airspace disease or pneumonia Similar small pleural effusions. Electronically Signed   By: Jerilynn Mages.  Shick M.D.   On: 03/14/2021 09:59      Medications:     Scheduled Medications: . acetaZOLAMIDE  250 mg Oral BID  . aspirin  81 mg Oral Daily  . atorvastatin  40 mg Oral Daily  . calcitRIOL  0.25 mcg Oral Daily  . carvedilol  3.125 mg Oral BID WC  . furosemide  80 mg Intravenous Q12H  . heparin  5,000 Units Subcutaneous Q8H  . insulin aspart  0-9 Units Subcutaneous TID WC  . isosorbide-hydrALAZINE  1 tablet Oral TID  .  sodium chloride flush  3 mL Intravenous Q12H     Infusions: . sodium chloride       PRN Medications:  sodium chloride, acetaminophen, ondansetron (ZOFRAN) IV, sodium chloride flush   Assessment/Plan:   1. Acute on Chronic Biventricular Heart Failure - Echo 01/03/21: EF 20-25% with severe RV dysfunction (previous EF 40-45%).Mild-mod MR.Felt to be mixed ischemic/NICM but unable to perform coronary angio due to CKD. - 01/17/21 with marked volume overload/ADHF. Underwent RHC which showed marked volume overload with biventricular failure and normal CO.Milrinone added to augment  diuresis. - Now readmitted w/ recurrent acute CHF. BNP >4,500 c/w prior baselines. - Volume status looks good to me exam - Can stop IV lasix  - I worry about low output HF stop carvedilol - Likely can be d/c'd home today from HF regimen. Will check REDS prior to d/c. - Home HF regimen:  Bidil 2 tabs tid (increased dose) Torsemide 40 daily  No ACE/ARB/spiro with CKD IV and hyperkalemia  2. HTN - Elevated [id number] systolic + fluid overload  - Increasing Bidil  3. T2DM  - poorly controlled - per IM  4. AKI on Stage IV CKD  - baseline SCr ~2.9  - up to 3.13 on amdit - creatinine 2.9 now - will need to establish care w/ nephrology. Was advised to f/u post hospital 4/22 but no appt yet  5. Hyperkalemia - in setting of AKI on CKD - resolved   Plan:  Will check REDS if < 40% home today from HF standpoint on   Bidil 2 tabs tid (increased dose) Torsemide 40 daily    Stop carvedilol  Need to consider Palliative Consult for advanced HF    Length of Stay: 1   Glori Bickers MD 03/15/2021, 5:08 AM  Advanced Heart Failure Team Pager [phone] (M-F; 7a - 4p)  Please contact [facility] for night-coverage after hours (4p -7a ) and weekends on [email or web address]

## 2021-03-15 NOTE — Progress Notes (Signed)
Pt's FMLA forms completed, signed by Dr Haroldine Laws, and faxed to [facility] at [phone]. Pt aware and given copies for her records

## 2021-03-15 NOTE — Progress Notes (Signed)
ReDS Vest / Clip - 03/15/21 0900      ReDS Vest / Clip   Station Marker A    Ruler Value 26    ReDS Value Range Moderate volume overload    ReDS Actual Value 37

## 2021-03-15 NOTE — Progress Notes (Signed)
   Reds CLip 37%. [place] for d/c today.    Heart failure team will sign off as of 03/15/21  HF Team Medication Recommendations for Home:  Bidil 2 tabs tid (increased dose) Torsemide 40 daily    Keep off  carvedilol   We will set up f/u.   Deundra Furber NP-C  9:23 AM

## 2021-03-15 NOTE — Plan of Care (Signed)

## 2021-03-15 NOTE — Progress Notes (Signed)
Inpatient Diabetes Program Recommendations  AACE/ADA: New Consensus Statement on Inpatient Glycemic Control (2015)  Target Ranges:  Prepandial:   less than 140 mg/dL      Peak postprandial:   less than 180 mg/dL (1-2 hours)      Critically ill patients:  140 - 180 mg/dL   Lab Results  Component Value Date   GLUCAP 153 (H) 03/15/2021   HGBA1C 9.1 (H) 03/14/2021    Review of Glycemic Control Results for Beverly Martin, Beverly Martin (MRN [id number]) as of 03/15/2021 13:31  Ref. Range 03/14/2021 18:00 03/14/2021 21:08 03/15/2021 06:08 03/15/2021 09:04 03/15/2021 11:16  Glucose-Capillary Latest Ref Range: 70 - 99 mg/dL 294 (H) 210 (H) 144 (H) 164 (H) 153 (H)  Diabetes history: Type 2 DM Outpatient Diabetes medications: none Current orders for Inpatient glycemic control: Novolog sensitive tid with meals  Inpatient Diabetes Program Recommendations:    Spoke briefly with patient regarding DM management.  She states that MD stopped Amaryl recently.  Encouraged patient to check her blood sugars daily and to let MD know if >200 mg/dL.  Patient and son verbalized understanding.   Thanks,  Adah Perl, RN, BC-ADM Inpatient Diabetes Coordinator Pager [phone] (8a-5p)

## 2021-03-15 NOTE — Evaluation (Signed)
Physical Therapy Evaluation Patient Details Name: Beverly Martin MRN: [id number] DOB: Aug 20, 1956 Today's Date: 03/15/2021   History of Present Illness  Pt is a 65 y/o female admitted 6/2 secondary to SOB and elevated blood sugars. Thought to be secondary to CHF. PMH includes chronic diastolic CHF, CKD stage IV, DM, HTN.  Clinical Impression  Pt admitted secondary to problem above with deficits below. Required min guard A for safety for transfers and gait using RW. Pt reports feeling weak, but tolerated mobility well. Educated about using RW at home to increase safety with mobility. Discussed HHPT, however, pt reports she does not feel like she needs at this point. Pt reports husband will be able to assist if needed at d/c. Will continue to follow acutely.     Follow Up Recommendations No PT follow up (pt refusing HHPT)    Equipment Recommendations  Rolling walker with 5" wheels    Recommendations for Other Services       Precautions / Restrictions Precautions Precautions: Fall Restrictions Weight Bearing Restrictions: No      Mobility  Bed Mobility Overal bed mobility: Needs Assistance Bed Mobility: Supine to Sit;Sit to Supine     Supine to sit: Supervision Sit to supine: Supervision   General bed mobility comments: Supervision for safety.    Transfers Overall transfer level: Needs assistance Equipment used: Rolling walker (2 wheeled) Transfers: Sit to/from Stand Sit to Stand: Min guard         General transfer comment: Min guard for safety. Cues for hand placement.  Ambulation/Gait Ambulation/Gait assistance: Min guard Gait Distance (Feet): 100 Feet Assistive device: Rolling walker (2 wheeled) Gait Pattern/deviations: Step-through pattern;Decreased stride length Gait velocity: Decreased   General Gait Details: Slow, cautious gait. Pt reporting legs felt weak, but overall tolerated mobility well. No overt LOB noted. Educated about using RW for increase  support  Financial trader Rankin (Stroke Patients Only)       Balance Overall balance assessment: Needs assistance Sitting-balance support: No upper extremity supported;Feet supported Sitting balance-Leahy Scale: Good     Standing balance support: No upper extremity supported;Bilateral upper extremity supported Standing balance-Leahy Scale: Fair Standing balance comment: Able to maintain static standing without UE support                             Pertinent Vitals/Pain Pain Assessment: Faces Faces Pain Scale: Hurts a little bit Pain Location: Bottom Pain Descriptors / Indicators: Sore Pain Intervention(s): Monitored during session;Limited activity within patient's tolerance;Repositioned    Home Living Family/patient expects to be discharged to:: Private residence Living Arrangements: Spouse/significant other Available Help at Discharge: Family;Available 24 hours/day Type of Home: House Home Access: Level entry     Home Layout: One level Home Equipment: None      Prior Function Level of Independence: Independent               Hand Dominance        Extremity/Trunk Assessment   Upper Extremity Assessment Upper Extremity Assessment: Generalized weakness    Lower Extremity Assessment Lower Extremity Assessment: Generalized weakness    Cervical / Trunk Assessment Cervical / Trunk Assessment: Normal  Communication   Communication: No difficulties  Cognition Arousal/Alertness: Awake/alert Behavior During Therapy: Anxious Overall Cognitive Status: Impaired/Different from baseline Area of Impairment: Memory;Problem solving  Memory: Decreased short-term memory;Decreased recall of precautions       Problem Solving: Slow processing General Comments: Pt likely close to baseline. Very anxious about mobility initially. Fixated on getting paperwork, even after case manager  instructed her that MD office had sent it.      General Comments      Exercises     Assessment/Plan    PT Assessment Patient needs continued PT services  PT Problem List Decreased strength;Decreased balance;Decreased activity tolerance;Decreased mobility;Decreased knowledge of use of DME;Decreased knowledge of precautions       PT Treatment Interventions DME instruction;Gait training;Functional mobility training;Therapeutic exercise;Therapeutic activities;Balance training;Patient/family education    PT Goals (Current goals can be found in the Care Plan section)  Acute Rehab PT Goals Patient Stated Goal: to get stronger PT Goal Formulation: With patient Time For Goal Achievement: 03/29/21 Potential to Achieve Goals: Good    Frequency Min 3X/week   Barriers to discharge        Co-evaluation               AM-PAC PT "6 Clicks" Mobility  Outcome Measure Help needed turning from your back to your side while in a flat bed without using bedrails?: A Little Help needed moving from lying on your back to sitting on the side of a flat bed without using bedrails?: A Little Help needed moving to and from a bed to a chair (including a wheelchair)?: A Little Help needed standing up from a chair using your arms (e.g., wheelchair or bedside chair)?: A Little Help needed to walk in hospital room?: A Little Help needed climbing 3-5 steps with a railing? : A Little 6 Click Score: 18    End of Session Equipment Utilized During Treatment: Gait belt;Oxygen Activity Tolerance: Patient tolerated treatment well Patient left: in bed;with call bell/phone within reach;with family/visitor present Nurse Communication: Mobility status PT Visit Diagnosis: Muscle weakness (generalized) (M62.81)    Time: [id number] PT Time Calculation (min) (ACUTE ONLY): 29 min   Charges:   PT Evaluation $PT Eval Low Complexity: 1 Low PT Treatments $Gait Training: 8-22 mins        Lou Miner, DPT   Acute Rehabilitation Services  Pager: [phone] Office: [phone]   Rudean Hitt 03/15/2021, 1:09 PM

## 2021-03-18 ENCOUNTER — Telehealth: Payer: Self-pay

## 2021-03-18 NOTE — Telephone Encounter (Cosign Needed)
Transition Care Management Unsuccessful Follow-up Telephone Call  Date of discharge and from where:  [facility] 03/15/21   Attempts:  1st Attempt  Reason for unsuccessful TCM follow-up call:  Left voice message

## 2021-03-20 ENCOUNTER — Other Ambulatory Visit: Payer: Self-pay

## 2021-03-20 ENCOUNTER — Encounter: Payer: Self-pay | Admitting: Family Medicine

## 2021-03-20 ENCOUNTER — Ambulatory Visit (INDEPENDENT_AMBULATORY_CARE_PROVIDER_SITE_OTHER): Payer: BC Managed Care – PPO | Admitting: Family Medicine

## 2021-03-20 VITALS — BP 110/76 | HR 99 | Temp 97.6°F

## 2021-03-20 DIAGNOSIS — N184 Chronic kidney disease, stage 4 (severe): Secondary | ICD-10-CM | POA: Diagnosis not present

## 2021-03-20 DIAGNOSIS — E1165 Type 2 diabetes mellitus with hyperglycemia: Secondary | ICD-10-CM

## 2021-03-20 DIAGNOSIS — I2510 Atherosclerotic heart disease of native coronary artery without angina pectoris: Secondary | ICD-10-CM

## 2021-03-20 DIAGNOSIS — I5023 Acute on chronic systolic (congestive) heart failure: Secondary | ICD-10-CM | POA: Diagnosis not present

## 2021-03-20 MED ORDER — INSULIN GLARGINE 100 UNITS/ML SOLOSTAR PEN: 4.0000 [IU] | PEN_INJECTOR | Freq: Every day | SUBCUTANEOUS | 11 refills | Status: DC

## 2021-03-20 MED ORDER — INSULIN PEN NEEDLE 32G X 4 MM MISC
4.0000 [IU] | Freq: Every day | 3 refills | Status: DC
Start: 2021-03-20 — End: 2021-04-30

## 2021-03-20 NOTE — Progress Notes (Signed)
Subjective:    Patient ID: Beverly Martin, female    DOB: [birth year], 65 y.o.   MRN: [id number]  No chief complaint on file. Pt's husband present this visit.  HPI Patient was seen today for HFU.  Pt hospitalized 6/2-03/15/21 for CHF exacerbation and hyperglycemia.  BiDil increased and component split secondary to cost, torsemide 40 mg daily added.  AKI noted.  1 L O2 continuous started after hospitalization in February 2022 by cardiologist, Dr. Haroldine Laws with 2 L for exertion.  Pt states she was unable to get a portable O2 compressor for use when out of the house.  Pt states she is working on diet.  Weighing herself daily.  Denies SOB, CP.  Pt having her sister help her check her bs.  Pt states she was placed on continuous leave with disability starting 01/17/21 and will be out x 6 mo. Pt inquires about her kidney function and what can be done.  Past Medical History:  Diagnosis Date   CAD (coronary artery disease)    Diabetes mellitus without complication ([place])    Essential hypertension    Hyperlipidemia     No Known Allergies  ROS General: Denies fever, chills, night sweats, changes in weight, changes in appetite HEENT: Denies headaches, ear pain, changes in vision, rhinorrhea, sore throat +dryness in nares CV: Denies CP, palpitations, SOB, orthopnea Pulm: Denies SOB, cough, wheezing GI: Denies abdominal pain, nausea, vomiting, diarrhea, constipation GU: Denies dysuria, hematuria, frequency, vaginal discharge Msk: Denies muscle cramps, joint pains Neuro: Denies weakness, numbness, tingling Skin: Denies rashes, bruising Psych: Denies depression, anxiety, hallucinations    Objective:    Blood pressure 110/76, pulse 99, temperature 97.6 F (36.4 C), temperature source Temporal, SpO2 99 %. On 2 L O2 via [place]., weight 117 lbs.  Gen. Pleasant, well-nourished, in no distress, normal affect   HEENT: [place]/AT, face symmetric, conjunctiva clear, no scleral icterus, PERRLA, EOMI, nares patent  without drainage, [place] in place on 2L O2. Pharynx without erythema or exudate. Lungs: no accessory muscle use, CTAB, no wheezes or rales Cardiovascular: RRR, no m/r/g, no peripheral edema Musculoskeletal: No deformities, no cyanosis or clubbing, normal tone Neuro:  A&Ox3, CN II-XII intact, normal gait Skin:  Warm, no lesions/ rash  Wt Readings from Last 3 Encounters:  03/15/21 114 lb 13.8 oz (52.1 kg)  03/13/21 117 lb 9.6 oz (53.3 kg)  02/04/21 121 lb (54.9 kg)    Lab Results  Component Value Date   WBC 7.7 03/14/2021   HGB 8.5 (L) 03/14/2021   HCT 27.6 (L) 03/14/2021   PLT 186 03/14/2021   GLUCOSE 143 (H) 03/15/2021   CHOL 245 (H) 06/10/2020   TRIG 93 06/10/2020   HDL 69 06/10/2020   LDLCALC 157 (H) 06/10/2020   ALT 11 03/14/2021   AST 12 (L) 03/14/2021   NA 140 03/15/2021   K 4.6 03/15/2021   CL 93 (L) 03/15/2021   CREATININE 2.94 (H) 03/15/2021   BUN 58 (H) 03/15/2021   CO2 41 (H) 03/15/2021   TSH 2.053 06/10/2020   INR 1.1 01/18/2021   HGBA1C 9.1 (H) 03/14/2021   MICROALBUR 33.3 (H) 12/06/2020    Assessment/Plan:  Pt seen for HFU, TCM phone call attempted, but unable to reach pt.  CKD (chronic kidney disease) stage 4, GFR 15-29 ml/min (HCC)  -Discussed the importance of scheduling appointment with nephrologist.  Several previous referrals have been placed but patient has not scheduled appointment. -avoid nephrotoxic meds -renally dose meds - Plan: Ambulatory referral to  Nephrology   Acute on chronic systolic heart failure (HCC) -stable -ECHO 01/03/21 with EF 20-25%, severe RV dysfunction, mild to moderate MR.  mixed ischemia/NICM however unable to do coronary angio 2/2 renal function. -discussed the importance of reducing sodium intake and other lifestyle modifications -daily wts -continue current meds: imdur 60 mg daily, hydralazine 25 mg TID, torsemide 40 mg daily.  (Imdur split into separate rxs 2/2 cost) -unable to use ACE I, ARB, or aldactone 2/2 CKD  IV. -continue f/u with Cards.   Uncontrolled type 2 diabetes mellitus with hyperglycemia (HCC)  -hgb A1C 9.1% on 03/14/21.  Was 6.4% on 12/06/20 -advised limited in po med options 2/2 renal function. -discussed starting lantus 4 units qhs.  Educated on use. -advised to check fsbs daily and bring meter/log to clinic visits. -lifestyle modifications strongly encouraged. -continue lipitor 40 mg daily -unable to use ACE I or ARB - Plan: insulin glargine (LANTUS) 100 unit/mL SOPN, Insulin Pen Needle 32G X 4 MM MISC  F/u in 1 month, sooner if needed.    Grier Mitts, MD

## 2021-03-20 NOTE — Patient Instructions (Signed)
Food Basics for Chronic Kidney Disease Chronic kidney disease (CKD) occurs when the kidneys are permanently damaged over a long period of time. When your kidneys are not working well, they cannot remove waste, fluids, and other substances from your blood as well as they did before. The substances can build up, which can worsen kidney damage and affect how your body functions. Certain foods lead to a buildup of these substances. By changing your diet, you can help prevent more kidney damage and delay or prevent the need for dialysis. What are tips for following this plan? Reading food labels  Check the amount of salt (sodium) in foods. Choose foods that have less than 300 milligrams (mg) per serving.  Check the ingredient list for phosphorus or potassium-based additives or preservatives.  Check the amount of saturated fat and trans fat. Limit or avoid these fats as told by your dietitian. Shopping  Avoid buying foods that are: ? Processed or prepackaged. ? Calcium-enriched or that have calcium added to them (are fortified).  Do not buy foods that have salt or sodium listed among the first five ingredients.  Buy canned vegetables and beans that say "no salt added" or "low sodium" and rinse them before eating. Cooking  Soak vegetables, such as potatoes, before cooking to reduce potassium. To do this: 1. Peel and cut the vegetables into small pieces. 2. Soak the vegetables in warm water for at least 2 hours. For every 1 cup of vegetables, use 10 cups of water. 3. Drain and rinse the vegetables with warm water. 4. Boil the vegetables for at least 5 minutes. Meal planning  Limit the amount of protein you eat from plant and animal sources each day.  Do not add salt to food when cooking or before eating.  Eat meals and snacks at around the same time each day. General information  Talk with your health care provider about whether you should take a vitamin and mineral supplement.  Use  standard measuring cups and spoons to measure servings of foods. Use a kitchen scale to measure portions of protein foods.  If told by your health care provider, avoid drinking too much fluid. Measure and count all liquids, including water, ice, soups, flavored gelatin, and frozen desserts such as ice pops or ice cream. If you have diabetes:  If you have diabetes (diabetes mellitus) and CKD, it is important to keep your blood sugar (glucose) in the target range recommended by your health care provider. Follow your diabetes management plan. This may include: ? Checking your blood glucose regularly. ? Taking medicines by mouth, taking insulin, or taking both. ? Exercising for at least 30 minutes on 5 or more days each week, or as told by your health care provider. ? Tracking how many servings of carbohydrates you eat at each meal.  You may be given specific guidelines on how much of certain foods and nutrients you may eat, depending on your stage of kidney disease and whether you have high blood pressure (hypertension). Follow your meal plan as told by your dietitian. What nutrients should I limit? Work with your health care provider and dietitian to develop a meal plan that is right for you. Foods you can eat and foods you should limit or avoid will depend on the stage of your kidney disease and any other health conditions you have. The items listed below are not a complete list. Talk with your dietitian about what dietary choices are best for you. Potassium Potassium affects how steadily   your heart beats. If too much potassium builds up in your blood, the potassium can cause an irregular heartbeat or even a heart attack. You may need to limit or avoid foods that are high in potassium, such as:  Milk and soy milk.  Fruits, such as bananas, apricots, nectarines, melon, prunes, raisins, kiwi, and oranges.  Vegetables, such as potatoes, sweet potatoes, yams, tomatoes, leafy greens, beets, avocado,  pumpkin, and winter squash.  White and lima beans.  Whole-wheat breads and pastas.  Beans and nuts. Phosphorus Phosphorus is a mineral found in your bones. A balance between calcium and phosphorus is needed to build and maintain healthy bones. Too much phosphorus pulls calcium from your bones. This can make your bones weak and more likely to break. Too much phosphorus can also make your skin itch. You may need to limit or avoid foods that are high in phosphorus, such as:  Milk and dairy products.  Dried beans and peas.  Tofu, soy milk, and other soy-based meat replacements.  Dark-colored sodas.  Nuts and peanut butter.  Meat, poultry, and fish.  Bran cereals and oatmeal. Protein Protein helps you make and keep muscle. It also helps to repair your body's cells and tissues. One of the natural breakdown products of protein is a waste product called urea. When your kidneys are not working properly, they cannot remove wastes, such as urea. Reducing how much protein you eat can help prevent a buildup of urea in your blood. Depending on your stage of kidney disease, you may need to limit foods that are high in protein. Sources of animal protein include:  Meat (all types).  Fish and seafood.  Poultry.  Eggs.  Dairy. Other protein foods include:  Beans and legumes.  Nuts and nut butter.  Soy and tofu.   Sodium Sodium helps to maintain a healthy balance of fluids in your body. Too much sodium can increase your blood pressure and have a negative effect on your heart and lungs. Too much sodium can also cause your body to retain too much fluid, making your kidneys work harder. Most people should have less than 2,300 mg of sodium each day. If you have hypertension, you may need to limit your sodium to 1,500 mg each day. You may need to limit or avoid foods that are high in sodium, such as:  Salt seasonings.  Soy sauce.  Cured and processed meats.  Salted crackers and snack  foods.  Fast food.  Canned soups and most canned foods.  Pickled foods.  Vegetable juice.  Boxed mixes or ready-to-eat boxed meals and side dishes.  Bottled dressings, sauces, and marinades. Talk with your dietitian about how much potassium, phosphorus, protein, and sodium you may have each day. Summary  Chronic kidney disease (CKD) can lead to a buildup of waste and extra substances in the body. Certain foods lead to a buildup of these substances. By changing your diet as told, you can help prevent more kidney damage and delay or prevent the need for dialysis.  Food intake changes are different for each person with CKD. Work with a dietitian to set up nutrient goals and a meal plan that is right for you.  If you have diabetes and CKD, it is important to keep your blood sugar in the target range recommended by your health care provider. This information is not intended to replace advice given to you by your health care provider. Make sure you discuss any questions you have with your health care   provider. Document Revised: 01/23/2020 Document Reviewed: 01/23/2020 Elsevier Patient Education  2021 [place].  Chronic Kidney Disease, Adult Chronic kidney disease is when lasting damage happens to the kidneys slowly over a long time. The kidneys help to:  Make pee (urine).  Make hormones.  Keep the right amount of fluids and chemicals in the body. Most often, this disease does not go away. You must take steps to help keep the kidney damage from getting worse. If steps are not taken, the kidneys might stop working forever. What are the causes?  Diabetes.  High blood pressure.  Diseases that affect the heart and blood vessels.  Other kidney diseases.  Diseases of the body's disease-fighting system.  A problem with the flow of pee.  Infections of the organs that make pee, store it, and take it out of the body.  Swelling or irritation of your blood vessels. What increases  the risk?  Getting older.  Having someone in your family who has kidney disease or kidney failure.  Having a disease caused by genes.  Taking medicines often that harm the kidneys.  Being near or having contact with harmful substances.  Being very overweight.  Using tobacco now or in the past. What are the signs or symptoms?  Feeling very tired.  Having a swollen face, legs, ankles, or feet.  Feeling like you may vomit or vomiting.  Not feeling hungry.  Being confused or not able to focus.  Twitches and cramps in the leg muscles or other muscles.  Dry, itchy skin.  A taste of metal in your mouth.  Making less pee, or making more pee.  Shortness of breath.  Trouble sleeping. You may also become anemic or get weak bones. Anemic means there is not enough red blood cells or hemoglobin in your blood. You may get symptoms slowly. You may not notice them until the kidney damage gets very bad. How is this treated? Often, there is no cure for this disease. Treatment can help with symptoms and help keep the disease from getting worse. You may need to:  Avoid alcohol.  Avoid foods that are high in salt, potassium, phosphorous, and protein.  Take medicines for symptoms and to help control other conditions.  Have dialysis. This treatment gets harmful waste out of your body.  Treat other problems that cause your kidney disease or make it worse. Follow these instructions at home: Medicines  Take over-the-counter and prescription medicines only as told by your doctor.  Do not take any new medicines, vitamins, or supplements unless your doctor says it is okay. Lifestyle  Do not smoke or use any products that contain nicotine or tobacco. If you need help quitting, ask your doctor.  If you drink alcohol: ? Limit how much you use to:  0-1 drink a day for women who are not pregnant.  0-2 drinks a day for men. ? Know how much alcohol is in your drink. In the [place] one  drink equals one 12 oz bottle of beer (355 mL), one 5 oz glass of wine (148 mL), or one 1 oz glass of hard liquor (44 mL).  Stay at a healthy weight. If you need help losing weight, ask your doctor.   General instructions  Follow instructions from your doctor about what you cannot eat or drink.  Track your blood pressure at home. Tell your doctor about any changes.  If you have diabetes, track your blood sugar.  Exercise at least 30 minutes a day, 5 days  a week.  Keep your shots (vaccinations) up to date.  Keep all follow-up visits.   Where to find more information  American Association of Kidney Patients: BombTimer.gl  National Kidney Foundation: www.kidney.[place]: https://mathis.com/  Life Options: www.lifeoptions.org  Kidney School: www.kidneyschool.org Contact a doctor if:  Your symptoms get worse.  You get new symptoms. Get help right away if:  You get symptoms of end-stage kidney disease. These include: ? Headaches. ? Losing feeling in your hands or feet. ? Easy bruising. ? Having hiccups often. ? Chest pain. ? Shortness of breath. ? Lack of menstrual periods, in women.  You have a fever.  You make less pee than normal.  You have pain or you bleed when you pee or poop. These symptoms may be an emergency. Get help right away. Call your local emergency services (911 in the [place]  Do not wait to see if the symptoms will go away.  Do not drive yourself to the hospital. Summary  Chronic kidney disease is when lasting damage happens to the kidneys slowly over a long time.  Causes of this disease include diabetes and high blood pressure.  Often, there is no cure for this disease. Treatment can help symptoms and help keep the disease from getting worse.  Treatment may involve lifestyle changes, medicines, and dialysis. This information is not intended to replace advice given to you by your health care provider. Make sure you discuss any  questions you have with your health care provider. Document Revised: 01/04/2020 Document Reviewed: 01/04/2020 Elsevier Patient Education  2021 [place] With Heart Failure Heart failure is a long-term (chronic) condition in which the heart cannot pump enough blood through the body. When this happens, parts of the body do not get the blood and oxygen they need. There is no cure for heart failure at this time, so it is important for you to take good care of yourself and follow the treatment plan you set with your health care provider. If you are living with heart failure, there are ways to help you manage the disease. How to manage lifestyle changes Living with heart failure requires you to make changes in your life. Your health care team will teach you about the changes you need to make in order to relieve your symptoms and lower your risk of going to the hospital. Work with your health care provider to develop a treatment plan that works for you. Activity  Ask your health care provider about attending cardiac rehabilitation. These programs include aerobic physical activity, which provides many benefits for your heart.  If no cardiac rehabilitation program is available, ask your health care provider what aerobic exercises are safe for you to do.  Return to your normal activities as told by your health care provider. Ask your health care provider what activities are safe for you.  Pace your daily activities and allow time for rest as needed. Managing stress It is normal to have many emotions about your diagnosis, such as fear, sadness, anger, and loss. If you feel any of these emotions and need help coping, contact your health care provider. Here are some ways to help yourself manage these emotions:  Talk to friends and family members about your condition. They can give you support and guidance. Explain your symptoms to them and, if comfortable, invite them to attend appointments or  rehabilitation with you.  Join a support group for people with chronic heart failure. Talking with other people who  have the same symptoms may give you new ways of coping with your disease and your emotions.  Accept help from others. Do not be ashamed if you need help with certain tasks.  Use stress management techniques, such as meditation, breathing exercises, or listening to relaxing music. Conditions such as depression and anxiety are common in persons with heart failure. Pay attention to changes in your mood, emotions, and stress levels. Tell your health care provider if you have any of the following symptoms:  Trouble sleeping or a change in your sleeping patterns.  Feeling sad, down, or depressed more often than not, every day for more than 2 weeks.  Losing interest in activities you normally enjoy.  Feeling irritable or crying for no reason.  Finding yourself worrying about the future often. Work You may need to develop a plan with your health care provider if heart failure interferes with your ability to work. This may include:  Reducing your work hours.  Finding functions that are less active or require less effort.  Planning rest periods during your work hours. Travel  Talk with your health care provider if you plan to travel. There may be circumstances in which your health care provider recommends that you do not travel or that you delay travel until your condition is under control.  When you travel, bring your medicine and a list of your medicines. If you are traveling by air, keep your medicines with you in a carry-on bag.  Consider finding a medical facility in the area you will be traveling to and determine what your health insurance will cover.  If you will be traveling by public transportation (airplane, train, bus), contact the company prior to traveling if you have special needs. This may include needs related to diet, oxygen, a wheelchair, a seating request, or  help with luggage.  If you use oxygen, make sure to bring enough oxygen with you.  If you have a battery-powered device, bring a fully charged extra battery with you.  If you have a device, bring a note from your health care provider and inform all security screening personnel that you have the device. You may need to go through special screening for safety. Sexual activity  Ask your health care provider when it is safe for you to resume sexual activity.  You may need to start slowly and gradually increase intimacy. You can increase intimacy by doing such things as caressing, touching, and holding each other.  Get regular exercise as told by your health care provider. This can benefit your sex life by building strength and endurance.   Sleep If your condition interferes with your sleep, find ways to improve your sleep quality, such as:  Sleep lying on your side, or sleep with your head elevated by raising the head of your bed or using multiple pillows.  Ask your health care provider about screening for sleep apnea.  Try to go to sleep and wake up at the same times every day.  Sleep in a dark, cool room.  Do not do any physical activity or eating for a few hours before bedtime.  Plan rest periods during the day, but do not take long naps during the day.   Where to find support  Consider talking with: ? Family members. ? Close friends. ? A mental health professional or therapist. ? A member of your church, faith, or community group.  Other sources of support include: ? Local support groups. Ask your health care provider about  groups near you. ? Online support groups, such as those found through the American Heart Association: supportnetwork.heart.org ? Local home care agencies, community agencies, or social agencies. ? A palliative care specialist. Palliative care can help you manage symptoms, promote comfort, improve quality of life, and maintain dignity. Where to find more  information  American Heart Association: heart.org  National Heart, Lung, and Blood Institute: https://www.hartman-hill.biz/  Centers for Disease Control and Prevention: https://www.reeves.com/  [place]: SolutionApps.it Contact a health care provider if:  You have a rapid weight gain.  You have increasing shortness of breath that is unusual for you.  You are unable to participate in your usual physical activities.  You tire easily.  You have difficulty sleeping, such as: ? You wake up feeling short of breath. ? You have to use more pillows to raise your head in order to sleep.  You cough more than normal, especially with physical activity.  You have any swelling or more swelling in areas such as your hands, feet, ankles, or abdomen.  You become dizzy or light-headed when you stand up.  You have changes to your appetite.  You have symptoms of depression or anxiety. Get help right away if:  You have difficulty breathing.  You notice or your family notices a change in your awareness, such as having trouble staying awake or having difficulty with concentration.  You have pain or discomfort in your chest.  You have an episode of fainting (syncope).  You feel like your heart is beating quickly (palpitations).  You have extreme feelings of sadness or loss of hope, or you have thoughts about hurting yourself or others. These symptoms may represent a serious problem that is an emergency. Do not wait to see if the symptoms will go away. Get medical help right away. Call your local emergency services (911 in the [place] Do not drive yourself to the hospital. Summary  There is no cure for heart failure, so it is important for you to take good care of yourself and follow the treatment plan set by your health care provider.  Ask your health care provider about attending cardiac rehabilitation. These programs include aerobic physical activity, which provides many benefits for  your heart.  It is normal to have many emotions about your diagnosis, such as fear, sadness, anger, and loss. If you feel any of these emotions and need help coping, contact your health care provider.  You may need to develop a plan with your health care provider if heart failure interferes with your ability to work. This information is not intended to replace advice given to you by your health care provider. Make sure you discuss any questions you have with your health care provider. Document Revised: 05/14/2020 Document Reviewed: 05/14/2020 Elsevier Patient Education  [place].  Diabetes Mellitus Basics  Diabetes mellitus, or diabetes, is a long-term (chronic) disease. It occurs when the body does not properly use sugar (glucose) that is released from food after you eat. Diabetes mellitus may be caused by one or both of these problems:  Your pancreas does not make enough of a hormone called insulin.  Your body does not react in a normal way to the insulin that it makes. Insulin lets glucose enter cells in your body. This gives you energy. If you have diabetes, glucose cannot get into cells. This causes high blood glucose (hyperglycemia). How to treat and manage diabetes You may need to take insulin or other diabetes medicines daily to  keep your glucose in balance. If you are prescribed insulin, you will learn how to give yourself insulin by injection. You may need to adjust the amount of insulin you take based on the foods that you eat. You will need to check your blood glucose levels using a glucose monitor as told by your health care provider. The readings can help determine if you have low or high blood glucose. Generally, you should have these blood glucose levels:  Before meals (preprandial): 80-130 mg/dL (4.4-7.2 mmol/L).  After meals (postprandial): below 180 mg/dL (10 mmol/L).  Hemoglobin A1c (HbA1c) level: less than 7%. Your health care provider will set treatment  goals for you. Keep all follow-up visits. This is important. Follow these instructions at home: Diabetes medicines Take your diabetes medicines every day as told by your health care provider. List your diabetes medicines here:  Name of medicine: ______________________________ ? Amount (dose): _______________ Time (a.m./p.m.): _______________ Notes: ___________________________________  Name of medicine: ______________________________ ? Amount (dose): _______________ Time (a.m./p.m.): _______________ Notes: ___________________________________  Name of medicine: ______________________________ ? Amount (dose): _______________ Time (a.m./p.m.): _______________ Notes: ___________________________________ Insulin If you use insulin, list the types of insulin you use here:  Insulin type: ______________________________ ? Amount (dose): _______________ Time (a.m./p.m.): _______________Notes: ___________________________________  Insulin type: ______________________________ ? Amount (dose): _______________ Time (a.m./p.m.): _______________ Notes: ___________________________________  Insulin type: ______________________________ ? Amount (dose): _______________ Time (a.m./p.m.): _______________ Notes: ___________________________________  Insulin type: ______________________________ ? Amount (dose): _______________ Time (a.m./p.m.): _______________ Notes: ___________________________________  Insulin type: ______________________________ ? Amount (dose): _______________ Time (a.m./p.m.): _______________ Notes: ___________________________________ Managing blood glucose Check your blood glucose levels using a glucose monitor as told by your health care provider. Write down the times that you check your glucose levels here:  Time: _______________ Notes: ___________________________________  Time: _______________ Notes: ___________________________________  Time: _______________ Notes:  ___________________________________  Time: _______________ Notes: ___________________________________  Time: _______________ Notes: ___________________________________  Time: _______________ Notes: ___________________________________   Low blood glucose Low blood glucose (hypoglycemia) is when glucose is at or below 70 mg/dL (3.9 mmol/L). Symptoms may include:  Feeling: ? Hungry. ? Sweaty and clammy. ? Irritable or easily upset. ? Dizzy. ? Sleepy.  Having: ? A fast heartbeat. ? A headache. ? A change in your vision. ? Numbness around the mouth, lips, or tongue.  Having trouble with: ? Moving (coordination). ? Sleeping. Treating low blood glucose To treat low blood glucose, eat or drink something containing sugar right away. If you can think clearly and swallow safely, follow the 15:15 rule:  Take 15 grams of a fast-acting carb (carbohydrate), as told by your health care provider.  Some fast-acting carbs are: ? Glucose tablets: take 3-4 tablets. ? Hard candy: eat 3-5 pieces. ? Fruit juice: drink 4 oz (120 mL). ? Regular (not diet) soda: drink 4-6 oz (120-180 mL). ? Honey or sugar: eat 1 Tbsp (15 mL).  Check your blood glucose levels 15 minutes after you take the carb.  If your glucose is still at or below 70 mg/dL (3.9 mmol/L), take 15 grams of a carb again.  If your glucose does not go above 70 mg/dL (3.9 mmol/L) after 3 tries, get help right away.  After your glucose goes back to normal, eat a meal or a snack within 1 hour. Treating very low blood glucose If your glucose is at or below 54 mg/dL (3 mmol/L), you have very low blood glucose (severe hypoglycemia). This is an emergency. Do not wait to see if the symptoms will go away. Get medical help right  away. Call your local emergency services (911 in the [place] Do not drive yourself to the hospital. Questions to ask your health care provider  Should I talk with a diabetes educator?  What equipment will I need to  care for myself at home?  What diabetes medicines do I need? When should I take them?  How often do I need to check my blood glucose levels?  What number can I call if I have questions?  When is my follow-up visit?  Where can I find a support group for people with diabetes? Where to find more information  American Diabetes Association: www.diabetes.org  Association of Diabetes Care and Education Specialists: www.diabeteseducator.org Contact a health care provider if:  Your blood glucose is at or above 240 mg/dL (13.3 mmol/L) for 2 days in a row.  You have been sick or have had a fever for 2 days or more, and you are not getting better.  You have any of these problems for more than 6 hours: ? You cannot eat or drink. ? You feel nauseous. ? You vomit. ? You have diarrhea. Get help right away if:  Your blood glucose is lower than 54 mg/dL (3 mmol/L).  You get confused.  You have trouble thinking clearly.  You have trouble breathing. These symptoms may represent a serious problem that is an emergency. Do not wait to see if the symptoms will go away. Get medical help right away. Call your local emergency services (911 in the [place] Do not drive yourself to the hospital. Summary  Diabetes mellitus is a chronic disease that occurs when the body does not properly use sugar (glucose) that is released from food after you eat.  Take insulin and diabetes medicines as told.  Check your blood glucose every day, as often as told.  Keep all follow-up visits. This is important. This information is not intended to replace advice given to you by your health care provider. Make sure you discuss any questions you have with your health care provider. Document Revised: 01/31/2020 Document Reviewed: 01/31/2020 Elsevier Patient Education  2021 [place].  Blood Glucose Monitoring, Adult Monitoring your blood sugar (glucose) is an important part of managing your diabetes. Blood glucose  monitoring involves checking your blood glucose as often as directed and keeping a log or record of your results over time. Checking your blood glucose regularly and keeping a blood glucose log can:  Help you and your health care provider adjust your diabetes management plan as needed, including your medicines or insulin.  Help you understand how food, exercise, illnesses, and medicines affect your blood glucose.  Let you know what your blood glucose is at any time. You can quickly find out if you have low blood glucose (hypoglycemia) or high blood glucose (hyperglycemia). Your health care provider will set individualized treatment goals for you. Your goals will be based on your age, other medical conditions you have, and how you respond to diabetes treatment. Generally, the goal of treatment is to maintain the following blood glucose levels:  Before meals (preprandial): 80-130 mg/dL (4.4-7.2 mmol/L).  After meals (postprandial): below 180 mg/dL (10 mmol/L).  A1C level: less than 7%. Supplies needed:  Blood glucose meter.  Test strips for your meter. Each meter has its own strips. You must use the strips that came with your meter.  A needle to prick your finger (lancet). Do not use a lancet more than one time.  A device that holds the lancet (lancing device).  A  journal or log book to write down your results. How to check your blood glucose Checking your blood glucose 1. Wash your hands for at least 20 seconds with soap and water. 2. Prick the side of your finger (not the tip) with the lancet. Do not use the same finger consecutively. 3. Gently rub the finger until a small drop of blood appears. 4. Follow instructions that come with your meter for inserting the test strip, applying blood to the strip, and using your blood glucose meter. 5. Write down your result and any notes in your log.   Using alternative sites Some meters allow you to use areas of your body other than your  finger (alternative sites) to test your blood. The most common alternative sites are the forearm, the thigh, and the palm of your hand. Alternative sites may not be as accurate as the fingers because blood flow is slower in those areas. This means that the result you get may be delayed, and it may be different from the result that you would get from your finger. Use the finger only, and do not use alternative sites, if:  You think you have hypoglycemia.  You sometimes do not know that your blood glucose is getting low (hypoglycemia unawareness). General tips and recommendations Blood glucose log  Every time you check your blood glucose, write down your result. Also write down any notes about things that may be affecting your blood glucose, such as your diet and exercise for the day. This information can help you and your health care provider: ? Look for patterns in your blood glucose over time. ? Adjust your diabetes management plan as needed.  Check if your meter allows you to download your records to a computer or if there is an app for the meter. Most glucose meters store a record of glucose readings in the meter.   If you have type 1 diabetes:  Check your blood glucose 4 or more times a day if you are on intensive insulin therapy with multiple daily injections (MDI) or if you are using an insulin pump. Check your blood glucose: ? Before every meal and snack. ? Before bedtime.  Also check your blood glucose: ? If you have symptoms of hypoglycemia. ? After treating low blood glucose. ? Before doing activities that create a risk for injury, like driving or using machinery. ? Before and after exercise. ? Two hours after a meal. ? Occasionally between 2:00 a.m. and 3:00 a.m., as directed.  You may need to check your blood glucose more often, 6-10 times per day, if: ? You have diabetes that is not well controlled. ? You are ill. ? You have a history of severe hypoglycemia. ? You have  hypoglycemia unawareness. If you have type 2 diabetes:  Check your blood glucose 2 or more times a day if you take insulin or other diabetes medicines.  Check your blood glucose 4 or more times a day if you are on intensive insulin therapy. Occasionally, you may also need to check your glucose between 2:00 a.m. and 3:00 a.m., as directed.  Also check your blood glucose: ? Before and after exercise. ? Before doing activities that create a risk for injury, like driving or using machinery.  You may need to check your blood glucose more often if: ? Your medicine is being adjusted. ? Your diabetes is not well controlled. ? You are ill. General tips  Make sure you always have your supplies with you.  After  you use a few boxes of test strips, adjust (calibrate) your blood glucose meter by following instructions that came with your meter.  If you have questions or need help, all blood glucose meters have a 24-hour hotline phone number available that you can call. Also contact your health care provider with questions or concerns you may have. Where to find more information  The American Diabetes Association: www.diabetes.org  The Association of Diabetes Care & Education Specialists: www.diabeteseducator.org Contact a health care provider if:  Your blood glucose is at or above 240 mg/dL (13.3 mmol/L) for 2 days in a row.  You have been sick or have had a fever for 2 days or longer, and you are not getting better.  You have any of the following problems for more than 6 hours: ? You cannot eat or drink. ? You have nausea or vomiting. ? You have diarrhea. Get help right away if:  Your blood glucose is lower than 54 mg/dL (3 mmol/L).  You become confused, or you have trouble thinking clearly.  You have difficulty breathing.  You have moderate or large ketone levels in your urine. These symptoms may represent a serious problem that is an emergency. Do not wait to see if the symptoms  will go away. Get medical help right away. Call your local emergency services (911 in the [place] Do not drive yourself to the hospital. Summary  Monitoring your blood glucose is an important part of managing your diabetes.  Blood glucose monitoring involves checking your blood glucose as often as directed and keeping a log or record of your results over time.  Your health care provider will set individualized treatment goals for you. Your goals will be based on your age, other medical conditions you have, and how you respond to diabetes treatment.  Every time you check your blood glucose, write down your result. Also, write down any notes about things that may be affecting your blood glucose, such as your diet and exercise for the day. This information is not intended to replace advice given to you by your health care provider. Make sure you discuss any questions you have with your health care provider. Document Revised: 06/27/2020 Document Reviewed: 06/27/2020 Elsevier Patient Education  2021 [place].  Insulin Treatment for Diabetes Mellitus Diabetes, also known as diabetes mellitus, is a long-term (chronic) disease. It occurs when the body does not properly use sugar (glucose) that is released from food after digestion. Glucose levels are controlled by a hormone called insulin. Insulin is made in the pancreas, which is an organ behind the stomach.  If you have type 1 diabetes, you must take insulin because your pancreas does not make any.  If you have type 2 diabetes, you might need to take insulin along with other medicines. With type 2 diabetes, you will have one or both of these problems: ? Your pancreas does not make enough insulin. ? Cells in your body do not respond properly to insulin that your body makes (insulin resistance). You must use insulin correctly to control your diabetes. You must have some insulin in your body at all times. Insulin treatment varies depending on your  type of diabetes, your treatment goals, and your medical history. Ask questions to understand your insulin treatment plan so you can be an active partner in managing your diabetes. How is insulin given? Insulin can be given only through a shot (injection). It is injected using a syringe and needle, an insulin pen, a pump, or a  jet injector. Your health care provider will:  Prescribe the type and amount of insulin that you need.  Tell you when you should inject your insulin. Where on the body should insulin be injected? Insulin is injected into a layer of fatty tissue under your skin. Good places to inject insulin include:  Abdomen. Generally, the abdomen is the best place to inject insulin. However, you should avoid any area that is less than 2 inches (5 cm) from your belly button.  Front of thigh.  Upper, outer side of thigh.  Upper, outer side of arm.  Upper, outer part of buttock. It is important to:  Give your injection in a slightly different place each time. This helps to prevent irritation and improve absorption.  Avoid injecting into areas that have scar tissue. Usually, you will give yourself insulin injections. Other people can also be taught how to give you injections. You will use a special type of syringe that is made only for insulin. Some people may have an insulin pump that delivers insulin steadily through a tube (cannula) that is placed under the skin.   What are the different types of insulin? The different types of insulin are described below. Specifics vary depending on the insulin product that your health care provider prescribes. Rapid-acting insulin:  Starts working quickly, within 15 minutes.  Can last for 4-5 hours.  Works well when taken right before a meal to quickly lower your blood glucose. Short-acting insulin:  Starts working in about 30 minutes.  Can last for 3-6 hours.  Should be taken about 30 minutes before you start eating a  meal. Intermediate-acting insulin:  Starts working in 2-4 hours.  Lasts for about 12-18 hours.  Lowers your blood glucose for a longer period of time. However, it does not work as well for lowering blood glucose right after a meal. Long-acting insulin:  Mimics the small amount of insulin that your pancreas usually makes throughout the day.  Should be used one or two times a day.  Is usually used in combination with other types of insulin or other medicines. Concentrated insulin, or U-500 insulin:  Contains a higher dose of insulin than most rapid-acting insulins. U-500 insulin has 5 times the amount of insulin per 1 mL.  Should be used only with the special U-500 syringe or U-500 insulin pen. Do not use another type of syringe with this insulin. The wrong type of syringe can cause serious problems. What are the side effects of insulin? Possible side effects of insulin treatment include:  Low blood glucose (hypoglycemia).  Weight gain.  Bruising or irritation at the injection site. Some of these side effects can be caused by incorrect insulin doses and improper injection technique. Be sure to learn how to inject insulin properly. What are common terms associated with insulin treatment? Some terms that you might hear include: Basal insulin, or basal rate  This is the constant amount of insulin that you need to have in your body to keep your blood glucose levels stable. People who have type 1 diabetes need basal insulin in a nonstop (continuous) or steady dose 24 hours a day.  Usually, intermediate-acting or long-acting insulin is used one or two times a day to manage glucose levels. Prandial or nutrition insulin  This refers to meal-related insulin.  Blood glucose rises quickly after a meal (postprandial). Rapid-acting or short-acting insulin can be used right before a meal (preprandial) to quickly lower your blood glucose.  You may be told to  adjust the amount of prandial  insulin that you take based on how much carbohydrate is in your meal. Correction insulin This may also be called a correction dose or supplemental dose. This is a small amount of rapid-acting or short-acting insulin that can be used to lower your blood glucose if it is too high. You may be told to check your blood glucose at certain times of the day and use correction insulin as needed. Tight control, or intensive therapy This means keeping your blood glucose as close to your target as possible, and preventing your blood glucose from getting too high after meals. People who have tight control of their diabetes have fewer long-term problems caused by diabetes. Follow these instructions at home: Talk with your health care provider or pharmacist about the type of insulin you should take and when you should take it. You should know when your insulin goes up the most (peaks) and when it wears off. You need this information so you can plan your meals and exercise. Eating and drinking  Follow instructions from your health care provider about a healthy meal plan. Do not skip meals.  Drink enough fluid to keep your urine pale yellow.  Follow your sick day plan whenever you cannot eat or drink normally. Make this plan in advance with your health care provider. Lifestyle  Work with your health care provider to manage your weight, blood pressure, cholesterol, and stress.  Exercise regularly.  Avoid drinking alcohol.  Avoid using any products that contain nicotine or tobacco, such as cigarettes, e-cigarettes, and chewing tobacco. If you need help quitting, ask your health care provider. General instructions  Check your blood glucose as told. Your health care provider will tell you how often and when you should check your blood glucose.  Make sure to check your blood glucose before and after you exercise. If you exercise longer or in a different way than usual, check your blood glucose more  often.  Make sure you know the symptoms of high and low blood sugar and how to treat these.  Take over-the-counter and prescription medicines only as told by your health care provider.  Carry a medical alert card or wear medical alert jewelry.  Keep all follow-up visits as told by your health care provider. This is important.   Summary  Diabetes is a long-term disease. It occurs when the body does not properly use sugar (glucose) that is released from food after digestion. Glucose levels are controlled by insulin.  You must use insulin correctly to control your diabetes. You must have some insulin in your body at all times.  Insulin treatment varies depending on your type of diabetes, your treatment goals, and your medical history.  Talk with your health care provider or pharmacist about the type of insulin you should take and when you should take it.  Check your blood glucose as told by your health care provider. Your health care provider will tell you how often and when you should check your blood glucose. This information is not intended to replace advice given to you by your health care provider. Make sure you discuss any questions you have with your health care provider. Document Revised: 09/13/2019 Document Reviewed: 09/15/2019 Elsevier Patient Education  [place].

## 2021-03-22 ENCOUNTER — Telehealth: Payer: Self-pay | Admitting: Family Medicine

## 2021-03-22 NOTE — Telephone Encounter (Signed)
Patient is calling and requesting back in regards to how to use her insulin pen needle, please advise. CB is [phone]

## 2021-03-25 NOTE — Telephone Encounter (Signed)
ATC patient, no answer, left vm that it would be best to either make an appointment, or they can go to local pharmacy to get help with insulin pen.

## 2021-03-27 ENCOUNTER — Encounter (HOSPITAL_COMMUNITY): Payer: BC Managed Care – PPO

## 2021-03-29 ENCOUNTER — Telehealth: Payer: Self-pay | Admitting: Family Medicine

## 2021-03-29 DIAGNOSIS — I5023 Acute on chronic systolic (congestive) heart failure: Secondary | ICD-10-CM | POA: Diagnosis not present

## 2021-03-29 NOTE — Telephone Encounter (Signed)
Patient called asking if she could get a referral to see a kidney specialist. She would need a morning appointment with the specialist if possible.  Patient also says that she lost her tester and the pharmacy told her that she could get a prescription for that.   Patient would like a call at [phone].  Please advise.

## 2021-04-01 NOTE — Telephone Encounter (Signed)
Spoke with patient, informed her a referral has been placed, also informed her she could try and get the pen from [facility]. Pt stated she will go by there the next time she goes out.

## 2021-04-03 ENCOUNTER — Other Ambulatory Visit: Payer: Self-pay | Admitting: Family Medicine

## 2021-04-09 ENCOUNTER — Telehealth: Payer: Self-pay | Admitting: Family Medicine

## 2021-04-09 MED ORDER — ATORVASTATIN CALCIUM 40 MG PO TABS: 40.0000 mg | ORAL_TABLET | Freq: Every day | ORAL | 2 refills | Status: AC

## 2021-04-09 MED ORDER — ISOSORBIDE MONONITRATE ER 60 MG PO TB24: 60.0000 mg | ORAL_TABLET | Freq: Every day | ORAL | 2 refills | Status: DC

## 2021-04-09 MED ORDER — HYDRALAZINE HCL 25 MG PO TABS: 25.0000 mg | ORAL_TABLET | Freq: Three times a day (TID) | ORAL | 2 refills | Status: DC

## 2021-04-09 NOTE — Telephone Encounter (Signed)
Pt call and stated she need refills on  isosorbide mononitrate (IMDUR) 60 MG 24 hr tablet / calcitRIOL (ROCALTROL) 0.25 MCG capsule/  hydrALAZINE (APRESOLINE) 25 MG tablet /  atorvastatin (LIPITOR) 40 MG tablet /[facility]  [id number] [place] [address]  [phone] Fax:  [phone]

## 2021-04-09 NOTE — Telephone Encounter (Signed)
Refills sent in for isosorbide, hydralazine, and atorvastatin. Refill was recently sent in on 04/03/21 for calcitriol for 90 days.

## 2021-04-09 NOTE — Progress Notes (Addendum)
ADVANCED HEART FAILURE CLINIC NOTE PCP: Dr Volanda Napoleon  HF Cardiologist: Dr Haroldine Laws   HPI: Beverly Martin is a 65 y.o.  emale  with a PMH significant for HFrEF, CAD s/p CABG, HTN, T2DM, HLD, CKD IV.   Echo 01/03/21:  EF 20-25% with severe RV dysfunction (previous EF 40-45%). Felt to be mixed ischemic/NNICM but unable to perform coronary angio due to CKD.  Admitted from HF [facility] on 01/17/21 with marked volume overload/ADHF. RHC showed marked volume overload with biventricular failure and normal CO.  Milrinone added to augment diuresis. Once euvolemic milrinone stopped. Diuresed 25 pounds. Creatinine improved 3.5 -> 2.9. Discharged on bidil + torsemide 40 mg daily. Discharge weight 115 pounds.  She was called by Nephrology for an appointment but she has not set up the appointment.  She was seen 03/13/21 for HF follow up with her husband. Stable NYHA III symptoms, GDMT limited by renal function. Had not seen nephrology yet.  Admitted 03/14/21 w/ complaints of weakness, near syncope and dyspnea, found to be in acute CHF, BNP>4500. Her blood sugar was >400. She received IV lasix 40 mg. She declined home health and was referred to Palliative Care outpatient.  Today she returns for HF follow up with her husband. Overall feeling fine. Using 1 liter [place] at rest and 2 liters with exertion.  Able to walk in the house some, uses a walker. Denies increasing SOB, CP, dizziness, edema, or PND/Orthopnea. Appetite poor. No fever or chills. Weight at home 118-120 pounds. Currently not working, has temporary medical leave for 6 months. She can retire at the end of this month. Eating a lot of ice, drinking >2L/day.  Cardiac Testing  RHC 01/2021  RA = 15 RV = 72/21 PA = 71/22 (40) PCW = 22 (v-wave 29) Fick cardiac output/index = 6.3/3.7 Thermo CO/CI = 4.3/2.6 PVR = 2.9 (Fick) Ao sat = 99% PA sat = 71%. 72% SVC sat = 66%   Assessment: 1. Elevated biventricular pressures 2. Normal cardiac output  - Echo  (3/22):  LVEF has decreased from 45% (8/21) to 20-25%, right ventricular systolic function is severely decreased. RV normal   ROS: All systems negative except as listed in HPI, PMH and Problem List.  SH:  Social History   Socioeconomic History   Marital status: Married    Spouse name: Not on file   Number of children: Not on file   Years of education: Not on file   Highest education level: Not on file  Occupational History   Not on file  Tobacco Use   Smoking status: Never   Smokeless tobacco: Never  Vaping Use   Vaping Use: Never used  Substance and Sexual Activity   Alcohol use: No   Drug use: No   Sexual activity: Not Currently  Other Topics Concern   Not on file  Social History Narrative   Not on file   Social Determinants of Health   Financial Resource Strain: Low Risk    Difficulty of Paying Living Expenses: Not hard at all  Food Insecurity: No Food Insecurity   Worried About Charity fundraiser in the Last Year: Never true   Ran Out of Food in the Last Year: Never true  Transportation Needs: No Transportation Needs   Lack of Transportation (Medical): No   Lack of Transportation (Non-Medical): No  Physical Activity: Insufficiently Active   Days of Exercise per Week: 2 days   Minutes of Exercise per Session: 20 min  Stress: Not  on file  Social Connections: Not on file  Intimate Partner Violence: Not on file   FH:  Family History  Problem Relation Age of Onset   Diabetes Mellitus II Mother    Esophageal cancer Father    Past Medical History:  Diagnosis Date   CAD (coronary artery disease)    Diabetes mellitus without complication ([place])    Essential hypertension    Hyperlipidemia    Current Outpatient Medications  Medication Sig Dispense Refill   aspirin EC 81 MG EC tablet Take 1 tablet (81 mg total) by mouth daily. Swallow whole. 360 tablet 0   atorvastatin (LIPITOR) 40 MG tablet Take 1 tablet (40 mg total) by mouth daily. (Patient taking differently:  Take 40 mg by mouth daily. She has been taking 80 mg daily.) 30 tablet 2   hydrALAZINE (APRESOLINE) 25 MG tablet Take 1 tablet (25 mg total) by mouth 3 (three) times daily. (Patient taking differently: Take 25 mg by mouth 3 (three) times daily. She has been taking 50 mg bid) 90 tablet 2   insulin glargine (LANTUS) 100 unit/mL SOPN Inject 4 Units into the skin at bedtime. 15 mL 11   Insulin Pen Needle 32G X 4 MM MISC 4 Units by Does not apply route at bedtime. 90 each 3   isosorbide mononitrate (IMDUR) 60 MG 24 hr tablet Take 1 tablet (60 mg total) by mouth daily. 30 tablet 2   torsemide (DEMADEX) 20 MG tablet Take 20 mg by mouth 2 (two) times daily.     calcitRIOL (ROCALTROL) 0.25 MCG capsule TAKE 1 CAPSULE BY MOUTH EVERY DAY (Patient not taking: Reported on 04/10/2021) 90 capsule 0   No current facility-administered medications for this encounter.  BP (!) 150/78   Pulse 89   Wt 53.9 kg   SpO2 97%   BMI 19.77 kg/m   Wt Readings from Last 3 Encounters:  04/10/21 53.9 kg  03/15/21 52.1 kg  03/13/21 53.3 kg   PHYSICAL EXAM: General:  NAD. No resp difficulty, appears older than stated age, arrived in [facility], on oxygen HEENT: Normal Neck: Supple. JVP 7-8. Carotids 2+ bilat; no bruits. No lymphadenopathy or thryomegaly appreciated. Cor: PMI nondisplaced. Regular rate & rhythm. No rubs, gallops or murmurs. Lungs: Clear, diminished in bases Abdomen: Soft, nontender, nondistended. No hepatosplenomegaly. No bruits or masses. Good bowel sounds. Extremities: No cyanosis, clubbing, rash, trace LE edema, tinea to RLE Neuro: alert & oriented x 3, cranial nerves grossly intact. Moves all 4 extremities w/o difficulty. Affect pleasant.  Reds: 54%  ECG: SR w/ PACs 97 bpm (personally reviewed).  ASSESSMENT & PLAN: 1. Chronic Combined Systolic and Diastolic CHF: - Likely mixed ischemic and NICM, poorly controlled HTN, poor medication adherence - Echo 12/2020 EF down from 45-50% in 2019 to 20-25% RV  normal.  - Not a candidate for LHC with Creatinine >1.5.  - NYHA III-IIIb. Volume status elevated, weight up 4 lbs from discharge, Reds 54%. - Increase torsemide to 60 mg bid x 3 days, then down to 40 mg bid thereafter. - GDMT limited by renal function. (No ARNi with low GFR, no bb with severely low EF and decompensation, No SGLT2i with h/o urosepsis and renal function).  - Continue Bidil 2 tab tid. Stressed importance of taking this three times a day. - BMET and BNP today.   2. T2DM: - Hgb A1C 9.1 (6/22).  - Not a candidate for SGLT2i (GFR and hx of urosepsis).  3. HTN: - Elevated, likely from volume overload. -  Continue hydralazine 50 mg tid + Imdur 60 mg daily. - Stressed importance to take three times a day.   4.  CKD Stage 4: - SCr baseline 2.8-3.  - She was referred to Nephrology and was called for an appointment but she did not schedule. Asked her to call Nephrology today.  - Check BMET/BNP today.  - Discussed that she needs to avoid NSAIDs.   - Follow up next week to assess volume status. She is at high risk for re-admit. - She needs to be seen by Nephrology soon  [facility], FNP-BC 04/10/21

## 2021-04-10 ENCOUNTER — Encounter (HOSPITAL_COMMUNITY): Payer: Self-pay

## 2021-04-10 ENCOUNTER — Other Ambulatory Visit: Payer: Self-pay

## 2021-04-10 ENCOUNTER — Ambulatory Visit (HOSPITAL_COMMUNITY)
Admission: RE | Admit: 2021-04-10 | Discharge: 2021-04-10 | Disposition: A | Discharge status: Home / Self Care | Payer: BC Managed Care – PPO | Source: Ambulatory Visit | Attending: Family Medicine | Admitting: Family Medicine

## 2021-04-10 ENCOUNTER — Telehealth (HOSPITAL_COMMUNITY): Payer: Self-pay | Admitting: Family Medicine

## 2021-04-10 VITALS — BP 150/78 | HR 89 | Wt 118.8 lb

## 2021-04-10 DIAGNOSIS — E785 Hyperlipidemia, unspecified: Secondary | ICD-10-CM | POA: Diagnosis not present

## 2021-04-10 DIAGNOSIS — I2510 Atherosclerotic heart disease of native coronary artery without angina pectoris: Secondary | ICD-10-CM | POA: Diagnosis not present

## 2021-04-10 DIAGNOSIS — I5042 Chronic combined systolic (congestive) and diastolic (congestive) heart failure: Secondary | ICD-10-CM | POA: Insufficient documentation

## 2021-04-10 DIAGNOSIS — Z794 Long term (current) use of insulin: Secondary | ICD-10-CM | POA: Insufficient documentation

## 2021-04-10 DIAGNOSIS — Z7982 Long term (current) use of aspirin: Secondary | ICD-10-CM | POA: Diagnosis not present

## 2021-04-10 DIAGNOSIS — Z951 Presence of aortocoronary bypass graft: Secondary | ICD-10-CM | POA: Insufficient documentation

## 2021-04-10 DIAGNOSIS — Z79899 Other long term (current) drug therapy: Secondary | ICD-10-CM | POA: Insufficient documentation

## 2021-04-10 DIAGNOSIS — I5082 Biventricular heart failure: Secondary | ICD-10-CM | POA: Diagnosis not present

## 2021-04-10 DIAGNOSIS — N184 Chronic kidney disease, stage 4 (severe): Secondary | ICD-10-CM | POA: Diagnosis not present

## 2021-04-10 DIAGNOSIS — I10 Essential (primary) hypertension: Secondary | ICD-10-CM

## 2021-04-10 DIAGNOSIS — I130 Hypertensive heart and chronic kidney disease with heart failure and stage 1 through stage 4 chronic kidney disease, or unspecified chronic kidney disease: Secondary | ICD-10-CM | POA: Diagnosis not present

## 2021-04-10 DIAGNOSIS — E1122 Type 2 diabetes mellitus with diabetic chronic kidney disease: Secondary | ICD-10-CM | POA: Insufficient documentation

## 2021-04-10 DIAGNOSIS — I5022 Chronic systolic (congestive) heart failure: Secondary | ICD-10-CM

## 2021-04-10 DIAGNOSIS — E1169 Type 2 diabetes mellitus with other specified complication: Secondary | ICD-10-CM

## 2021-04-10 LAB — BASIC METABOLIC PANEL
Anion gap: 10 (ref 5–15)
BUN: 52 mg/dL — ABNORMAL HIGH (ref 8–23)
CO2: 39 mmol/L — ABNORMAL HIGH (ref 22–32)
Calcium: 9.6 mg/dL (ref 8.9–10.3)
Chloride: 89 mmol/L — ABNORMAL LOW (ref 98–111)
Creatinine, Ser: 2.93 mg/dL — ABNORMAL HIGH (ref 0.44–1.00)
GFR, Estimated: 17 mL/min — ABNORMAL LOW (ref >60)
Glucose, Bld: 229 mg/dL — ABNORMAL HIGH (ref 70–99)
Potassium: 5.4 mmol/L — ABNORMAL HIGH (ref 3.5–5.1)
Sodium: 138 mmol/L (ref 135–145)

## 2021-04-10 LAB — BRAIN NATRIURETIC PEPTIDE: B Natriuretic Peptide: >4500 pg/mL — ABNORMAL HIGH (ref 0.0–100.0)

## 2021-04-10 MED ORDER — LOKELMA 10 G PO PACK: 10.0000 g | PACK | Freq: Once | ORAL | 0 refills | Status: AC

## 2021-04-10 NOTE — Addendum Note (Signed)
Encounter addended by: Rafael Bihari, FNP on: 04/10/2021 1:23 PM  Actions taken: Clinical Note Signed

## 2021-04-10 NOTE — Telephone Encounter (Signed)
Spoke with patient and informed her of her elevated potassium and need for [facility]. Her renal function is stable, BNP elevated. She voiced understanding. Rx for [facility] sent to patient's pharmacy. Will repeat labs in a week.  Allena Katz, FNP-BC

## 2021-04-10 NOTE — Addendum Note (Signed)
Addended by: Rafael Bihari on: 04/10/2021 03:24 PM   Modules accepted: Orders

## 2021-04-10 NOTE — Patient Instructions (Signed)
INCREASE Torsemide to 60 mg twice a day for 3 days then 40 mg twice a day therafter -take 3 tabs tonight 04/10/2021 -take 3 tabs in the morning and 3 tabs in the afternoon on Thursday 04/11/2021 -take 3 tabs in the morning and 3 tabs in the afternoon on Friday 04/12/2021 -take 3 tabs in the morning and 3 tabs in the afternoon on Saturday 04/13/2021 -take 2 tabs in the morning and 2 tabs in the afternoon starting Sunday 04/14/2021. Continue with this dose.   Your physician recommends that you schedule a follow-up appointment in: 2 weeks nurse visit and in 3-4 weeks  in the Advanced Practitioners (PA/NP) Clinic    Do the following things EVERYDAY: Weigh yourself in the morning before breakfast. Write it down and keep it in a log. Take your medicines as prescribed Eat low salt foods--Limit salt (sodium) to 2000 mg per day.  Stay as active as you can everyday Limit all fluids for the day to less than 2 liters  At the [facility], you and your health needs are our priority. As part of our continuing mission to provide you with exceptional heart care, we have created designated Provider Care Teams. These Care Teams include your primary Cardiologist (physician) and Advanced Practice Providers (APPs- Physician Assistants and Nurse Practitioners) who all work together to provide you with the care you need, when you need it.   You may see any of the following providers on your designated Care Team at your next follow up: Dr Glori Bickers Dr Loralie Champagne Dr Patrice Paradise, NP Lyda Jester, [place] Ginnie Smart Audry Riles, PharmD   Please be sure to bring in all your medications bottles to every appointment.

## 2021-04-23 ENCOUNTER — Other Ambulatory Visit: Payer: Self-pay

## 2021-04-23 ENCOUNTER — Inpatient Hospital Stay (HOSPITAL_COMMUNITY)
Admission: EM | Admit: 2021-04-23 | Discharge: 2021-04-30 | DRG: 291 | Disposition: A | Discharge status: Home Health | Payer: BC Managed Care – PPO | Attending: Internal Medicine | Admitting: Internal Medicine

## 2021-04-23 ENCOUNTER — Emergency Department (HOSPITAL_COMMUNITY): Payer: BC Managed Care – PPO

## 2021-04-23 DIAGNOSIS — Z951 Presence of aortocoronary bypass graft: Secondary | ICD-10-CM

## 2021-04-23 DIAGNOSIS — B961 Klebsiella pneumoniae [K. pneumoniae] as the cause of diseases classified elsewhere: Secondary | ICD-10-CM | POA: Diagnosis present

## 2021-04-23 DIAGNOSIS — I272 Pulmonary hypertension, unspecified: Secondary | ICD-10-CM | POA: Diagnosis not present

## 2021-04-23 DIAGNOSIS — N184 Chronic kidney disease, stage 4 (severe): Secondary | ICD-10-CM | POA: Diagnosis not present

## 2021-04-23 DIAGNOSIS — J9811 Atelectasis: Secondary | ICD-10-CM | POA: Diagnosis not present

## 2021-04-23 DIAGNOSIS — N390 Urinary tract infection, site not specified: Secondary | ICD-10-CM | POA: Diagnosis present

## 2021-04-23 DIAGNOSIS — R739 Hyperglycemia, unspecified: Secondary | ICD-10-CM

## 2021-04-23 DIAGNOSIS — I132 Hypertensive heart and chronic kidney disease with heart failure and with stage 5 chronic kidney disease, or end stage renal disease: Principal | ICD-10-CM | POA: Diagnosis present

## 2021-04-23 DIAGNOSIS — J9601 Acute respiratory failure with hypoxia: Secondary | ICD-10-CM | POA: Diagnosis present

## 2021-04-23 DIAGNOSIS — I517 Cardiomegaly: Secondary | ICD-10-CM | POA: Diagnosis not present

## 2021-04-23 DIAGNOSIS — I5082 Biventricular heart failure: Secondary | ICD-10-CM | POA: Diagnosis present

## 2021-04-23 DIAGNOSIS — E1165 Type 2 diabetes mellitus with hyperglycemia: Secondary | ICD-10-CM | POA: Diagnosis not present

## 2021-04-23 DIAGNOSIS — B373 Candidiasis of vulva and vagina: Secondary | ICD-10-CM | POA: Diagnosis not present

## 2021-04-23 DIAGNOSIS — R569 Unspecified convulsions: Secondary | ICD-10-CM | POA: Diagnosis not present

## 2021-04-23 DIAGNOSIS — N289 Disorder of kidney and ureter, unspecified: Secondary | ICD-10-CM | POA: Diagnosis not present

## 2021-04-23 DIAGNOSIS — D539 Nutritional anemia, unspecified: Secondary | ICD-10-CM | POA: Diagnosis not present

## 2021-04-23 DIAGNOSIS — I739 Peripheral vascular disease, unspecified: Secondary | ICD-10-CM | POA: Diagnosis not present

## 2021-04-23 DIAGNOSIS — J90 Pleural effusion, not elsewhere classified: Secondary | ICD-10-CM | POA: Diagnosis not present

## 2021-04-23 DIAGNOSIS — R778 Other specified abnormalities of plasma proteins: Secondary | ICD-10-CM | POA: Diagnosis not present

## 2021-04-23 DIAGNOSIS — R251 Tremor, unspecified: Secondary | ICD-10-CM | POA: Diagnosis present

## 2021-04-23 DIAGNOSIS — Z833 Family history of diabetes mellitus: Secondary | ICD-10-CM

## 2021-04-23 DIAGNOSIS — Z9889 Other specified postprocedural states: Secondary | ICD-10-CM | POA: Diagnosis not present

## 2021-04-23 DIAGNOSIS — Z7982 Long term (current) use of aspirin: Secondary | ICD-10-CM

## 2021-04-23 DIAGNOSIS — D631 Anemia in chronic kidney disease: Secondary | ICD-10-CM | POA: Diagnosis present

## 2021-04-23 DIAGNOSIS — E874 Mixed disorder of acid-base balance: Secondary | ICD-10-CM | POA: Diagnosis present

## 2021-04-23 DIAGNOSIS — I2510 Atherosclerotic heart disease of native coronary artery without angina pectoris: Secondary | ICD-10-CM | POA: Diagnosis present

## 2021-04-23 DIAGNOSIS — E1122 Type 2 diabetes mellitus with diabetic chronic kidney disease: Secondary | ICD-10-CM | POA: Diagnosis present

## 2021-04-23 DIAGNOSIS — R400 Somnolence: Secondary | ICD-10-CM | POA: Diagnosis present

## 2021-04-23 DIAGNOSIS — R64 Cachexia: Secondary | ICD-10-CM | POA: Diagnosis present

## 2021-04-23 DIAGNOSIS — Z79899 Other long term (current) drug therapy: Secondary | ICD-10-CM

## 2021-04-23 DIAGNOSIS — I2781 Cor pulmonale (chronic): Secondary | ICD-10-CM | POA: Diagnosis present

## 2021-04-23 DIAGNOSIS — Z800 Family history of malignant neoplasm of digestive organs: Secondary | ICD-10-CM

## 2021-04-23 DIAGNOSIS — I428 Other cardiomyopathies: Secondary | ICD-10-CM | POA: Diagnosis present

## 2021-04-23 DIAGNOSIS — N179 Acute kidney failure, unspecified: Secondary | ICD-10-CM | POA: Diagnosis present

## 2021-04-23 DIAGNOSIS — E1169 Type 2 diabetes mellitus with other specified complication: Secondary | ICD-10-CM | POA: Diagnosis not present

## 2021-04-23 DIAGNOSIS — D696 Thrombocytopenia, unspecified: Secondary | ICD-10-CM | POA: Diagnosis not present

## 2021-04-23 DIAGNOSIS — I10 Essential (primary) hypertension: Secondary | ICD-10-CM | POA: Diagnosis present

## 2021-04-23 DIAGNOSIS — I5043 Acute on chronic combined systolic (congestive) and diastolic (congestive) heart failure: Secondary | ICD-10-CM | POA: Diagnosis present

## 2021-04-23 DIAGNOSIS — Z743 Need for continuous supervision: Secondary | ICD-10-CM | POA: Diagnosis not present

## 2021-04-23 DIAGNOSIS — R7989 Other specified abnormal findings of blood chemistry: Secondary | ICD-10-CM | POA: Diagnosis present

## 2021-04-23 DIAGNOSIS — R188 Other ascites: Secondary | ICD-10-CM | POA: Diagnosis not present

## 2021-04-23 DIAGNOSIS — E785 Hyperlipidemia, unspecified: Secondary | ICD-10-CM | POA: Diagnosis present

## 2021-04-23 DIAGNOSIS — N189 Chronic kidney disease, unspecified: Secondary | ICD-10-CM | POA: Diagnosis present

## 2021-04-23 DIAGNOSIS — Z20822 Contact with and (suspected) exposure to covid-19: Secondary | ICD-10-CM | POA: Diagnosis not present

## 2021-04-23 DIAGNOSIS — I110 Hypertensive heart disease with heart failure: Secondary | ICD-10-CM | POA: Diagnosis not present

## 2021-04-23 DIAGNOSIS — J9621 Acute and chronic respiratory failure with hypoxia: Secondary | ICD-10-CM | POA: Diagnosis not present

## 2021-04-23 DIAGNOSIS — E875 Hyperkalemia: Secondary | ICD-10-CM | POA: Diagnosis not present

## 2021-04-23 DIAGNOSIS — I5021 Acute systolic (congestive) heart failure: Secondary | ICD-10-CM | POA: Diagnosis not present

## 2021-04-23 DIAGNOSIS — Z794 Long term (current) use of insulin: Secondary | ICD-10-CM

## 2021-04-23 DIAGNOSIS — I499 Cardiac arrhythmia, unspecified: Secondary | ICD-10-CM | POA: Diagnosis not present

## 2021-04-23 DIAGNOSIS — R079 Chest pain, unspecified: Secondary | ICD-10-CM | POA: Diagnosis not present

## 2021-04-23 DIAGNOSIS — I5023 Acute on chronic systolic (congestive) heart failure: Secondary | ICD-10-CM | POA: Diagnosis not present

## 2021-04-23 DIAGNOSIS — Z9114 Patient's other noncompliance with medication regimen: Secondary | ICD-10-CM

## 2021-04-23 DIAGNOSIS — I509 Heart failure, unspecified: Secondary | ICD-10-CM

## 2021-04-23 DIAGNOSIS — I130 Hypertensive heart and chronic kidney disease with heart failure and stage 1 through stage 4 chronic kidney disease, or unspecified chronic kidney disease: Secondary | ICD-10-CM | POA: Diagnosis not present

## 2021-04-23 DIAGNOSIS — Z6820 Body mass index (BMI) 20.0-20.9, adult: Secondary | ICD-10-CM

## 2021-04-23 HISTORY — DX: Unspecified combined systolic (congestive) and diastolic (congestive) heart failure: I50.40

## 2021-04-23 LAB — CBC
HCT: 28.6 % — ABNORMAL LOW (ref 36.0–46.0)
Hemoglobin: 8.6 g/dL — ABNORMAL LOW (ref 12.0–15.0)
MCH: 31.7 pg (ref 26.0–34.0)
MCHC: 30.1 g/dL (ref 30.0–36.0)
MCV: 105.5 fL — ABNORMAL HIGH (ref 80.0–100.0)
Platelets: 131 10*3/uL — ABNORMAL LOW (ref 150–400)
RBC: 2.71 MIL/uL — ABNORMAL LOW (ref 3.87–5.11)
RDW: 13.8 % (ref 11.5–15.5)
WBC: 7.9 10*3/uL (ref 4.0–10.5)
nRBC: 0 % (ref 0.0–0.2)

## 2021-04-23 NOTE — ED Triage Notes (Signed)
GEMS report: Pr from home c/o SOB, chest pain, and generalize malaise. At home pt hasnt had her home insulin in past few days and has not monitored her CBG. Pt on 2 L oxygen, hypoxia 89%. Now 99% 4 L. CP rating 1/10. CBG 427, 243 ASA, NTG 0.4x2.

## 2021-04-23 NOTE — ED Provider Notes (Signed)
Emergency Medicine Provider Triage Evaluation Note  Beverly Martin , a 65 y.o. female  was evaluated in triage.  Pt complains of chest pain.  Onset 6pm.  Intermittent in nature.  Describes as burning.  Has heart hx.  Wears home O2 as of late.  Review of Systems  Positive: CP, SOB Negative: Fever, cough  Physical Exam  BP (!) 152/73 (BP Location: Left Arm)   Pulse 96   Temp 98.3 F (36.8 C) (Oral)   Resp 18   SpO2 100%  Gen:   Awake, no distress   Resp:  Normal effort  MSK:   Moves extremities without difficulty  Other:    Medical Decision Making  Medically screening exam initiated at 10:51 PM.  Appropriate orders placed.  RONIQUA MARRUFFO was informed that the remainder of the evaluation will be completed by another provider, this initial triage assessment does not replace that evaluation, and the importance of remaining in the ED until their evaluation is complete.  CP   Montine Circle, PA-C 04/23/21 [place], [place], DO 04/23/21 2320

## 2021-04-23 NOTE — ED Triage Notes (Signed)
Pt c/o chest pain onset while at rest today. Pt states improvement of chest pain on ED arrival 2/10.States she has not been able to take her insulin past several weeks because her insulin pen broke. Pt was 100% on 2 L of oxygen (baseline o2).

## 2021-04-24 ENCOUNTER — Inpatient Hospital Stay (HOSPITAL_COMMUNITY): Payer: BC Managed Care – PPO

## 2021-04-24 ENCOUNTER — Encounter (HOSPITAL_COMMUNITY): Payer: Self-pay | Admitting: Internal Medicine

## 2021-04-24 ENCOUNTER — Encounter (HOSPITAL_COMMUNITY): Payer: BC Managed Care – PPO

## 2021-04-24 DIAGNOSIS — I5021 Acute systolic (congestive) heart failure: Secondary | ICD-10-CM | POA: Diagnosis not present

## 2021-04-24 DIAGNOSIS — E785 Hyperlipidemia, unspecified: Secondary | ICD-10-CM | POA: Diagnosis present

## 2021-04-24 DIAGNOSIS — I10 Essential (primary) hypertension: Secondary | ICD-10-CM | POA: Diagnosis not present

## 2021-04-24 DIAGNOSIS — Z9889 Other specified postprocedural states: Secondary | ICD-10-CM | POA: Diagnosis not present

## 2021-04-24 DIAGNOSIS — I2510 Atherosclerotic heart disease of native coronary artery without angina pectoris: Secondary | ICD-10-CM | POA: Diagnosis present

## 2021-04-24 DIAGNOSIS — R778 Other specified abnormalities of plasma proteins: Secondary | ICD-10-CM | POA: Diagnosis not present

## 2021-04-24 DIAGNOSIS — Z20822 Contact with and (suspected) exposure to covid-19: Secondary | ICD-10-CM | POA: Diagnosis present

## 2021-04-24 DIAGNOSIS — E875 Hyperkalemia: Secondary | ICD-10-CM | POA: Diagnosis present

## 2021-04-24 DIAGNOSIS — N390 Urinary tract infection, site not specified: Secondary | ICD-10-CM | POA: Diagnosis present

## 2021-04-24 DIAGNOSIS — D696 Thrombocytopenia, unspecified: Secondary | ICD-10-CM | POA: Diagnosis present

## 2021-04-24 DIAGNOSIS — R64 Cachexia: Secondary | ICD-10-CM | POA: Diagnosis present

## 2021-04-24 DIAGNOSIS — J9601 Acute respiratory failure with hypoxia: Secondary | ICD-10-CM | POA: Diagnosis present

## 2021-04-24 DIAGNOSIS — I5043 Acute on chronic combined systolic (congestive) and diastolic (congestive) heart failure: Secondary | ICD-10-CM | POA: Diagnosis present

## 2021-04-24 DIAGNOSIS — I428 Other cardiomyopathies: Secondary | ICD-10-CM | POA: Diagnosis present

## 2021-04-24 DIAGNOSIS — I2781 Cor pulmonale (chronic): Secondary | ICD-10-CM | POA: Diagnosis present

## 2021-04-24 DIAGNOSIS — E874 Mixed disorder of acid-base balance: Secondary | ICD-10-CM | POA: Diagnosis present

## 2021-04-24 DIAGNOSIS — D631 Anemia in chronic kidney disease: Secondary | ICD-10-CM | POA: Diagnosis present

## 2021-04-24 DIAGNOSIS — N184 Chronic kidney disease, stage 4 (severe): Secondary | ICD-10-CM | POA: Diagnosis present

## 2021-04-24 DIAGNOSIS — J9621 Acute and chronic respiratory failure with hypoxia: Secondary | ICD-10-CM

## 2021-04-24 DIAGNOSIS — N179 Acute kidney failure, unspecified: Secondary | ICD-10-CM | POA: Diagnosis present

## 2021-04-24 DIAGNOSIS — B373 Candidiasis of vulva and vagina: Secondary | ICD-10-CM | POA: Diagnosis not present

## 2021-04-24 DIAGNOSIS — E1122 Type 2 diabetes mellitus with diabetic chronic kidney disease: Secondary | ICD-10-CM | POA: Diagnosis present

## 2021-04-24 DIAGNOSIS — I5082 Biventricular heart failure: Secondary | ICD-10-CM | POA: Diagnosis present

## 2021-04-24 DIAGNOSIS — I132 Hypertensive heart and chronic kidney disease with heart failure and with stage 5 chronic kidney disease, or end stage renal disease: Secondary | ICD-10-CM | POA: Diagnosis present

## 2021-04-24 DIAGNOSIS — B961 Klebsiella pneumoniae [K. pneumoniae] as the cause of diseases classified elsewhere: Secondary | ICD-10-CM | POA: Diagnosis present

## 2021-04-24 DIAGNOSIS — I272 Pulmonary hypertension, unspecified: Secondary | ICD-10-CM | POA: Diagnosis present

## 2021-04-24 DIAGNOSIS — E1169 Type 2 diabetes mellitus with other specified complication: Secondary | ICD-10-CM | POA: Diagnosis present

## 2021-04-24 DIAGNOSIS — I739 Peripheral vascular disease, unspecified: Secondary | ICD-10-CM | POA: Diagnosis not present

## 2021-04-24 DIAGNOSIS — D539 Nutritional anemia, unspecified: Secondary | ICD-10-CM | POA: Diagnosis present

## 2021-04-24 DIAGNOSIS — E1165 Type 2 diabetes mellitus with hyperglycemia: Secondary | ICD-10-CM | POA: Diagnosis present

## 2021-04-24 DIAGNOSIS — N189 Chronic kidney disease, unspecified: Secondary | ICD-10-CM

## 2021-04-24 LAB — BASIC METABOLIC PANEL
Anion gap: 11 (ref 5–15)
BUN: 73 mg/dL — ABNORMAL HIGH (ref 8–23)
CO2: 37 mmol/L — ABNORMAL HIGH (ref 22–32)
Calcium: 9.2 mg/dL (ref 8.9–10.3)
Chloride: 89 mmol/L — ABNORMAL LOW (ref 98–111)
Creatinine, Ser: 3.65 mg/dL — ABNORMAL HIGH (ref 0.44–1.00)
GFR, Estimated: 13 mL/min — ABNORMAL LOW (ref >60)
Glucose, Bld: 384 mg/dL — ABNORMAL HIGH (ref 70–99)
Potassium: 6 mmol/L — ABNORMAL HIGH (ref 3.5–5.1)
Sodium: 137 mmol/L (ref 135–145)

## 2021-04-24 LAB — BLOOD GAS, ARTERIAL
Acid-Base Excess: 13.9 mmol/L — ABNORMAL HIGH (ref 0.0–2.0)
Bicarbonate: 42 mmol/L — ABNORMAL HIGH (ref 20.0–28.0)
Drawn by: 548791
FIO2: 32
O2 Saturation: 99.5 %
Patient temperature: 37
pCO2 arterial: 109 mmHg (ref 32.0–48.0)
pH, Arterial: 7.211 — ABNORMAL LOW (ref 7.350–7.450)
pO2, Arterial: 252 mmHg — ABNORMAL HIGH (ref 83.0–108.0)

## 2021-04-24 LAB — I-STAT VENOUS BLOOD GAS, ED
Acid-Base Excess: 17 mmol/L — ABNORMAL HIGH (ref 0.0–2.0)
Bicarbonate: 46.3 mmol/L — ABNORMAL HIGH (ref 20.0–28.0)
Calcium, Ion: 1.16 mmol/L (ref 1.15–1.40)
HCT: 29 % — ABNORMAL LOW (ref 36.0–46.0)
Hemoglobin: 9.9 g/dL — ABNORMAL LOW (ref 12.0–15.0)
O2 Saturation: 61 %
Potassium: 5.6 mmol/L — ABNORMAL HIGH (ref 3.5–5.1)
Sodium: 137 mmol/L (ref 135–145)
TCO2: 49 mmol/L — ABNORMAL HIGH (ref 22–32)
pCO2, Ven: 92.2 mmHg (ref 44.0–60.0)
pH, Ven: 7.309 (ref 7.250–7.430)
pO2, Ven: 37 mmHg (ref 32.0–45.0)

## 2021-04-24 LAB — GLUCOSE, CAPILLARY
Glucose-Capillary: 191 mg/dL — ABNORMAL HIGH (ref 70–99)
Glucose-Capillary: 82 mg/dL (ref 70–99)

## 2021-04-24 LAB — POTASSIUM: Potassium: 5.7 mmol/L — ABNORMAL HIGH (ref 3.5–5.1)

## 2021-04-24 LAB — RESP PANEL BY RT-PCR (FLU A&B, COVID) ARPGX2
Influenza A by PCR: NEGATIVE
Influenza B by PCR: NEGATIVE
SARS Coronavirus 2 by RT PCR: NEGATIVE

## 2021-04-24 LAB — TROPONIN I (HIGH SENSITIVITY)
Troponin I (High Sensitivity): 29 ng/L — ABNORMAL HIGH (ref <18)
Troponin I (High Sensitivity): 58 ng/L — ABNORMAL HIGH (ref <18)
Troponin I (High Sensitivity): 222 ng/L (ref <18)

## 2021-04-24 LAB — BRAIN NATRIURETIC PEPTIDE: B Natriuretic Peptide: >4500 pg/mL — ABNORMAL HIGH (ref 0.0–100.0)

## 2021-04-24 LAB — TSH: TSH: 1.901 u[IU]/mL (ref 0.350–4.500)

## 2021-04-24 LAB — CBG MONITORING, ED
Glucose-Capillary: 264 mg/dL — ABNORMAL HIGH (ref 70–99)
Glucose-Capillary: 191 mg/dL — ABNORMAL HIGH (ref 70–99)

## 2021-04-24 MED ORDER — FUROSEMIDE 10 MG/ML IJ SOLN
120.0000 mg | Freq: Two times a day (BID) | INTRAVENOUS | Status: DC
  Filled 2021-04-24 (×4): qty 12

## 2021-04-24 MED ORDER — SODIUM ZIRCONIUM CYCLOSILICATE 5 G PO PACK
5.0000 g | PACK | Freq: Once | ORAL | Status: AC
  Administered 2021-04-24: 5 g via ORAL
  Filled 2021-04-24: qty 1

## 2021-04-24 MED ORDER — HEPARIN SODIUM (PORCINE) 5000 UNIT/ML IJ SOLN
5000.0000 [IU] | Freq: Three times a day (TID) | INTRAMUSCULAR | Status: DC
  Administered 2021-04-24 – 2021-04-30 (×19): 5000 [IU] via SUBCUTANEOUS
  Filled 2021-04-24 (×19): qty 1

## 2021-04-24 MED ORDER — SODIUM CHLORIDE 0.9% FLUSH
3.0000 mL | Freq: Two times a day (BID) | INTRAVENOUS | Status: DC
  Administered 2021-04-24 – 2021-04-30 (×12): 3 mL via INTRAVENOUS

## 2021-04-24 MED ORDER — ACETAMINOPHEN 325 MG PO TABS: 650.0000 mg | ORAL_TABLET | ORAL | Status: DC | PRN

## 2021-04-24 MED ORDER — INSULIN ASPART 100 UNIT/ML IJ SOLN
0.0000 [IU] | Freq: Three times a day (TID) | INTRAMUSCULAR | Status: DC
  Administered 2021-04-24 (×2): 1 [IU] via SUBCUTANEOUS
  Administered 2021-04-24 – 2021-04-25 (×3): 3 [IU] via SUBCUTANEOUS
  Administered 2021-04-26 (×2): 1 [IU] via SUBCUTANEOUS
  Administered 2021-04-27: 3 [IU] via SUBCUTANEOUS
  Administered 2021-04-27: 2 [IU] via SUBCUTANEOUS
  Administered 2021-04-27 – 2021-04-28 (×2): 1 [IU] via SUBCUTANEOUS
  Administered 2021-04-28: 3 [IU] via SUBCUTANEOUS
  Administered 2021-04-29: 2 [IU] via SUBCUTANEOUS
  Administered 2021-04-29: 3 [IU] via SUBCUTANEOUS
  Administered 2021-04-30 (×2): 1 [IU] via SUBCUTANEOUS

## 2021-04-24 MED ORDER — FUROSEMIDE 10 MG/ML IJ SOLN
60.0000 mg | Freq: Two times a day (BID) | INTRAMUSCULAR | Status: DC
  Administered 2021-04-24: 60 mg via INTRAVENOUS
  Filled 2021-04-24: qty 6

## 2021-04-24 MED ORDER — FUROSEMIDE 10 MG/ML IJ SOLN
60.0000 mg | Freq: Once | INTRAMUSCULAR | Status: AC
  Administered 2021-04-24: 60 mg via INTRAVENOUS
  Filled 2021-04-24: qty 6

## 2021-04-24 MED ORDER — INSULIN GLARGINE 100 UNIT/ML ~~LOC~~ SOLN
5.0000 [IU] | Freq: Every day | SUBCUTANEOUS | Status: DC
  Administered 2021-04-24 – 2021-04-30 (×7): 5 [IU] via SUBCUTANEOUS
  Filled 2021-04-24 (×7): qty 0.05

## 2021-04-24 MED ORDER — ONDANSETRON HCL 4 MG/2ML IJ SOLN: 4.0000 mg | Freq: Four times a day (QID) | INTRAMUSCULAR | Status: DC | PRN

## 2021-04-24 MED ORDER — ISOSORB DINITRATE-HYDRALAZINE 20-37.5 MG PO TABS
0.5000 | ORAL_TABLET | Freq: Three times a day (TID) | ORAL | Status: DC
  Administered 2021-04-24 (×2): 0.5 via ORAL
  Filled 2021-04-24 (×2): qty 0.5
  Filled 2021-04-24 (×3): qty 1

## 2021-04-24 MED ORDER — SODIUM CHLORIDE 0.9 % IV SOLN: 250.0000 mL | INTRAVENOUS | Status: DC | PRN

## 2021-04-24 MED ORDER — SODIUM CHLORIDE 0.9% FLUSH: 3.0000 mL | INTRAVENOUS | Status: DC | PRN

## 2021-04-24 NOTE — Progress Notes (Signed)
Lab called this nurse with results of pCO2 from ABG, which = 109.  MD on floor and aware of results.  Respiratory called to place patient on Bipap.

## 2021-04-24 NOTE — ED Notes (Signed)
Awaiting Lokelma from pharmacy

## 2021-04-24 NOTE — Progress Notes (Signed)
Inpatient Diabetes Program Recommendations  AACE/ADA: New Consensus Statement on Inpatient Glycemic Control (2015)  Target Ranges:  Prepandial:   less than 140 mg/dL      Peak postprandial:   less than 180 mg/dL (1-2 hours)      Critically ill patients:  140 - 180 mg/dL   Lab Results  Component Value Date   GLUCAP 191 (H) 04/24/2021   HGBA1C 9.1 (H) 03/14/2021    Review of Glycemic Control Results for Beverly Martin, Beverly Martin (MRN [id number]) as of 04/24/2021 14:48  Ref. Range 04/24/2021 08:54 04/24/2021 14:28  Glucose-Capillary Latest Ref Range: 70 - 99 mg/dL 264 (H) 191 (H)   Diabetes history: DM 2 Outpatient Diabetes medications:  Lantus 4 units daily (started on  Current orders for Inpatient glycemic control:  Novolog 0-6 units tid with meals  Inpatient Diabetes Program Recommendations:    Referral received.  RN states patient slightly confused and getting ready to be moved to floor.  Per note- "States she has not been able to take her insulin because the insulin pen broke and she states she was not taught how to use it".  Diabetes coordinator will follow up with patient on 7/14 and demonstrate insulin pen use to her and family.    -Consider adding Lantus 5 units daily while in the hospital to see how patient responds to basal insulin.    Thanks  Adah Perl, RN, BC-ADM Inpatient Diabetes Coordinator Pager [phone] (8a-5p)

## 2021-04-24 NOTE — ED Provider Notes (Signed)
[facility] EMERGENCY DEPARTMENT Provider Note   CSN: [id number] Arrival date & time: 04/23/21  2245     History Chief Complaint  Patient presents with   Chest Pain    Beverly Martin is a 65 y.o. female.   Chest Pain Associated symptoms: shortness of breath and weakness   Associated symptoms: no abdominal pain   Patient presents with chest pain and shortness of breath.  Began last night.  History of heart failure.  States her sugars are high also.  States she has not been able to take her insulin because the insulin pen broke and she states she was not taught how to use it.  Chest pain is dull at times.  Has had some mild shortness of breath.  On chronic oxygen but upon arrival had sats in 80s and had to go from 2 L to 4 L.  Unsure if her weight is up or down.  Unsure if she feels as if she is carrying more fluid or less fluid.  Reviewing records appears that her weight is up around 3 pounds however.  Does have some edema on her legs.    Past Medical History:  Diagnosis Date   CAD (coronary artery disease)    Diabetes mellitus without complication ([place])    Essential hypertension    Hyperlipidemia     Patient Active Problem List   Diagnosis Date Noted   Hyperglycemia    Acute on chronic combined systolic and diastolic CHF, NYHA class 3 ([place]) [id number]   Chronic systolic heart failure ([place]) 01/16/2021   CKD (chronic kidney disease) stage 4, GFR 15-29 ml/min ([place]) 01/04/2021   CHF (congestive heart failure) ([place]) [id number]   Acute systolic heart failure ([place]) 06/29/2020   Acute CHF (congestive heart failure) ([place]) 06/10/2020   Anasarca 06/10/2020   Dyslipidemia 06/10/2020   Positive blood culture 10/21/2017   UTI (urinary tract infection) 10/20/2017   Sepsis ([place]) 10/20/2017   AKI (acute kidney injury) ([place]) 10/20/2017   Elevated troponin 10/20/2017   Essential hypertension    Type 2 diabetes mellitus with hyperlipidemia (HCC)    CAD (coronary artery  disease)     Past Surgical History:  Procedure Laterality Date   CARDIAC SURGERY     CABG   CORONARY ARTERY BYPASS GRAFT     RIGHT HEART CATH N/A 01/18/2021   Procedure: RIGHT HEART CATH;  Surgeon: Jolaine Artist, MD;  Location: [facility];  Service: Cardiovascular;  Laterality: N/A;     OB History   No obstetric history on file.     Family History  Problem Relation Age of Onset   Diabetes Mellitus II Mother    Esophageal cancer Father     Social History   Tobacco Use   Smoking status: Never   Smokeless tobacco: Never  Vaping Use   Vaping Use: Never used  Substance Use Topics   Alcohol use: No   Drug use: No    Home Medications Prior to Admission medications   Medication Sig Start Date End Date Taking? Authorizing Provider  aspirin EC 81 MG EC tablet Take 1 tablet (81 mg total) by mouth daily. Swallow whole. 06/19/20 06/14/21  Kayleen Memos, DO  atorvastatin (LIPITOR) 40 MG tablet Take 1 tablet (40 mg total) by mouth daily. Patient taking differently: Take 40 mg by mouth daily. She has been taking 80 mg daily. 04/09/21   Billie Ruddy, MD  calcitRIOL (ROCALTROL) 0.25 MCG capsule TAKE 1 CAPSULE  BY MOUTH EVERY DAY Patient not taking: Reported on 04/10/2021 04/03/21   Billie Ruddy, MD  hydrALAZINE (APRESOLINE) 25 MG tablet Take 1 tablet (25 mg total) by mouth 3 (three) times daily. Patient taking differently: Take 25 mg by mouth 3 (three) times daily. She has been taking 50 mg bid 04/09/21 04/09/22  Billie Ruddy, MD  insulin glargine (LANTUS) 100 unit/mL SOPN Inject 4 Units into the skin at bedtime. 03/20/21   Billie Ruddy, MD  Insulin Pen Needle 32G X 4 MM MISC 4 Units by Does not apply route at bedtime. 03/20/21   Billie Ruddy, MD  isosorbide mononitrate (IMDUR) 60 MG 24 hr tablet Take 1 tablet (60 mg total) by mouth daily. 04/09/21 04/09/22  Billie Ruddy, MD  torsemide (DEMADEX) 20 MG tablet Take 20 mg by mouth 2 (two) times daily.    [provider]    Allergies    Patient has no known allergies.  Review of Systems   Review of Systems  Constitutional:  Negative for appetite change.  HENT:  Negative for congestion.   Respiratory:  Positive for shortness of breath.   Cardiovascular:  Positive for chest pain and leg swelling.  Gastrointestinal:  Negative for abdominal pain.  Endocrine: Negative for polyuria.  Musculoskeletal:  Negative for gait problem.  Skin:  Negative for rash.  Neurological:  Positive for weakness.  Psychiatric/Behavioral:  The patient is not hyperactive.    Physical Exam Updated Vital Signs BP 135/69   Pulse 82   Temp 98.2 F (36.8 C) (Oral)   Resp 17   Ht '5\' 4"'$  (1.626 m)   Wt 53.5 kg   SpO2 100%   BMI 20.25 kg/m   Physical Exam Vitals and nursing note reviewed.  Eyes:     Pupils: Pupils are equal, round, and reactive to light.  Neck:     Vascular: JVD present.  Cardiovascular:     Rate and Rhythm: Normal rate and regular rhythm.  Pulmonary:     Breath sounds: Rales present.     Comments: Harsh breath sounds at the bases. Chest:     Chest wall: No tenderness.  Abdominal:     Tenderness: There is no abdominal tenderness.  Musculoskeletal:     Right lower leg: Edema present.     Left lower leg: Edema present.     Comments: Some pitting edema bilateral lower extremities.  Skin:    Capillary Refill: Capillary refill takes less than 2 seconds.  Neurological:     Mental Status: She is alert and oriented to person, place, and time.    ED Results / Procedures / Treatments   Labs (all labs ordered are listed, but only abnormal results are displayed) Labs Reviewed  BASIC METABOLIC PANEL - Abnormal; Notable for the following components:      Result Value   Potassium 6.0 (*)    Chloride 89 (*)    CO2 37 (*)    Glucose, Bld 384 (*)    BUN 73 (*)    Creatinine, Ser 3.65 (*)    GFR, Estimated 13 (*)    All other components within normal limits  CBC - Abnormal; Notable for  the following components:   RBC 2.71 (*)    Hemoglobin 8.6 (*)    HCT 28.6 (*)    MCV 105.5 (*)    Platelets 131 (*)    All other components within normal limits  TROPONIN I (HIGH SENSITIVITY) - Abnormal; Notable for  the following components:   Troponin I (High Sensitivity) 29 (*)    All other components within normal limits  TROPONIN I (HIGH SENSITIVITY) - Abnormal; Notable for the following components:   Troponin I (High Sensitivity) 58 (*)    All other components within normal limits    EKG EKG Interpretation  Date/Time:  Tuesday April 23 2021 22:46:01 EDT Ventricular Rate:  99 PR Interval:  136 QRS Duration: 104 QT Interval:  366 QTC Calculation: 469 R Axis:   102 Text Interpretation: Normal sinus rhythm Rightward axis Left ventricular hypertrophy with repolarization abnormality ( Cornell product ) Cannot rule out Septal infarct , age undetermined Abnormal ECG No significant change since last tracing Confirmed by Davonna Belling [phone]) on 04/23/2021 10:57:48 PM  Radiology DG Chest 2 View  Result Date: 04/23/2021 CLINICAL DATA:  Chest pain EXAM: CHEST - 2 VIEW COMPARISON:  Choose 12/02/2018 FINDINGS: Prior median sternotomy and CABG. Similar cardiomegaly. Aortic atherosclerosis. Similar size of the small bilateral pleural effusions with adjacent atelectasis. Mild diffuse interstitial opacities. The visualized skeletal structures is unchanged. IMPRESSION: Cardiomegaly with interstitial edema and stable small bilateral pleural effusions. Electronically Signed   By: Dahlia Bailiff MD   On: 04/23/2021 23:32    Procedures Procedures   Medications Ordered in ED Medications - No data to display  ED Course  I have reviewed the triage vital signs and the nursing notes.  Pertinent labs & imaging results that were available during my care of the patient were reviewed by me and considered in my medical decision making (see chart for details).    MDM Rules/Calculators/A&P                           Patient presents for few different complaints.  Mild shortness of breath and chest pain.  Appears to be volume overloaded since her weight is up around 3 pounds.  Creatinine increased however.  As is her potassium.  Required milrinone with recent admission for diuresis.  Increased oxygen requirement. Also hyperglycemia.  Sugars above 300 but not in DKA.  Had not been taking the insulin due to issues with her insulin pen.  With hyperglycemia acute kidney injury hyperkalemia and congestive heart failure I feels the patient benefit from mission to the hospital.  Will discuss with hospitalist. Final Clinical Impression(s) / ED Diagnoses Final diagnoses:  Acute on chronic congestive heart failure, unspecified heart failure type ([place])  AKI (acute kidney injury) ([place])  Hyperglycemia  Hyperkalemia    Rx / DC Orders ED Discharge Orders     None        Davonna Belling, MD 04/24/21 [phone]

## 2021-04-24 NOTE — ED Notes (Signed)
Pt reports increased BGL at home stating she has a new insulin pen and was unable to figure out how to administer her scheduled insulin.  States she has not been dosed in several days

## 2021-04-24 NOTE — Consult Note (Signed)
[facility]  INPATIENT CONSULTATION  Reason for Consultation: AKI on CKDV Requesting Provider: Dr Fuller Plan   HPI: Beverly Martin is an 65 y.o. female with PMHx of CAD s/p CABG in [id number], combined systolic and diastolic heart failure (EF 20-25%), type II diabetes mellitus, hypertension, CKD IV (baseline sCr 2-3), and hyperlipidemia with multiple recent admissions for acute on chronic systolic HF and following in the [facility] HF clinic presented with concerns of hyperglycemia and somnolence admitted for acute on chronic systolic and diastolic heart failure exacerbation.  Of note, patient has had multiple admissions over the past several months. Nephrology was consulted during her admission in April 2022 for AKI on CKDIV that improved with diuresis. Patient was recommended to follow up in two weeks after discharge; however was unable to do so.  On initial evaluation, patient is somnolent and only oriented to self. No acute complaints and denies any pain. On re-evaluation, patient with improved mentation. She reports she came to the hospital due to trouble with her sugars and getting her insulin pen to work. She also endorses some weakness in her legs on standing and lower extremity shaking on prolonged periods of standing. She denies any urinary symptoms at this time.     PMH: Past Medical History:  Diagnosis Date   CAD (coronary artery disease)    Combined systolic and diastolic ACC/AHA stage C congestive heart failure ([place])    Diabetes mellitus without complication ([place])    Essential hypertension    Hyperlipidemia    PSH: Past Surgical History:  Procedure Laterality Date   CARDIAC SURGERY     CABG   CORONARY ARTERY BYPASS GRAFT     RIGHT HEART CATH N/A 01/18/2021   Procedure: RIGHT HEART CATH;  Surgeon: Jolaine Artist, MD;  Location: [facility];  Service: Cardiovascular;  Laterality: N/A;    Past Medical History:  Diagnosis Date   CAD (coronary artery disease)     Combined systolic and diastolic ACC/AHA stage C congestive heart failure (HCC)    Diabetes mellitus without complication ([place])    Essential hypertension    Hyperlipidemia     Medications:  I have reviewed the patient's current medications.  Medications Prior to Admission  Medication Sig Dispense Refill   aspirin EC 81 MG EC tablet Take 1 tablet (81 mg total) by mouth daily. Swallow whole. 360 tablet 0   atorvastatin (LIPITOR) 40 MG tablet Take 1 tablet (40 mg total) by mouth daily. 30 tablet 2   calcitRIOL (ROCALTROL) 0.25 MCG capsule TAKE 1 CAPSULE BY MOUTH EVERY DAY (Patient taking differently: Take 0.25 mcg by mouth daily.) 90 capsule 0   hydrALAZINE (APRESOLINE) 25 MG tablet Take 1 tablet (25 mg total) by mouth 3 (three) times daily. 90 tablet 2   isosorbide mononitrate (IMDUR) 60 MG 24 hr tablet Take 1 tablet (60 mg total) by mouth daily. 30 tablet 2   torsemide (DEMADEX) 20 MG tablet Take 20 mg by mouth 2 (two) times daily.     insulin glargine (LANTUS) 100 unit/mL SOPN Inject 4 Units into the skin at bedtime. (Patient not taking: No sig reported) 15 mL 11   Insulin Pen Needle 32G X 4 MM MISC 4 Units by Does not apply route at bedtime. 47 each 3    ALLERGIES:  No Known Allergies  FAM HX: Family History  Problem Relation Age of Onset   Diabetes Mellitus II Mother    Esophageal cancer Father     Social  History:   reports that she has never smoked. She has never used smokeless tobacco. She reports that she does not drink alcohol and does not use drugs.  ROS: Negative except as stated in HPI.  Blood pressure 118/83, pulse 80, temperature 98.2 F (36.8 C), temperature source Oral, resp. rate 16, height '5\' 4"'$  (1.626 m), weight 50.5 kg, SpO2 100 %. PHYSICAL EXAM: Gen: chronically ill appearing elderly thin female, no acute distress  Eyes: anicteric, EOMI Neck: supple, no thyromegaly appreciated; did not appreciate significant JVD CV: RRR, S1 and s2 present, no m/r/g Abd:  soft, nondistended, nontender, +BS Lungs: rales present to mid lung fields bilaterally, on 2L oxygen Extr: 1+ bilateral lower extremity edema, distal pulses present  Neuro: alert/oriented x3, no apparent focal deficit noted Skin: warm and dry, no rashes or lesions noted.    Results for orders placed or performed during the hospital encounter of 04/23/21 (from the past 48 hour(s))  Basic metabolic panel     Status: Abnormal   Collection Time: 04/23/21 10:59 PM  Result Value Ref Range   Sodium 137 135 - 145 mmol/L   Potassium 6.0 (H) 3.5 - 5.1 mmol/L   Chloride 89 (L) 98 - 111 mmol/L   CO2 37 (H) 22 - 32 mmol/L   Glucose, Bld 384 (H) 70 - 99 mg/dL    Comment: Glucose reference range applies only to samples taken after fasting for at least 8 hours.   BUN 73 (H) 8 - 23 mg/dL   Creatinine, Ser 3.65 (H) 0.44 - 1.00 mg/dL   Calcium 9.2 8.9 - 10.3 mg/dL   GFR, Estimated 13 (L) >60 mL/min    Comment: (NOTE) Calculated using the CKD-EPI Creatinine Equation (2021)    Anion gap 11 5 - 15    Comment: Performed at [facility] [address] [place], [place] [address]  CBC     Status: Abnormal   Collection Time: 04/23/21 10:59 PM  Result Value Ref Range   WBC 7.9 4.0 - 10.5 K/uL   RBC 2.71 (L) 3.87 - 5.11 MIL/uL   Hemoglobin 8.6 (L) 12.0 - 15.0 g/dL   HCT 28.6 (L) 36.0 - 46.0 %   MCV 105.5 (H) 80.0 - 100.0 fL   MCH 31.7 26.0 - 34.0 pg   MCHC 30.1 30.0 - 36.0 g/dL   RDW 13.8 11.5 - 15.5 %   Platelets 131 (L) 150 - 400 K/uL   nRBC 0.0 0.0 - 0.2 %    Comment: Performed at [facility] [address], [place], [place] [address]  Troponin I (High Sensitivity)     Status: Abnormal   Collection Time: 04/23/21 10:59 PM  Result Value Ref Range   Troponin I (High Sensitivity) 29 (H) <18 ng/L    Comment: (NOTE) Elevated high sensitivity troponin I (hsTnI) values and significant  changes across serial measurements may suggest ACS but many other  chronic and acute conditions are  known to elevate hsTnI results.  Refer to the "Links" section for chest pain algorithms and additional  guidance. Performed at [facility], [place] [address], [place], [place] [address]   Troponin I (High Sensitivity)     Status: Abnormal   Collection Time: 04/24/21  2:31 AM  Result Value Ref Range   Troponin I (High Sensitivity) 58 (H) <18 ng/L    Comment: (NOTE) Elevated high sensitivity troponin I (hsTnI) values and significant  changes across serial measurements may suggest ACS but many other  chronic and acute conditions are known to elevate hsTnI results.  Refer to the Links section for chest pain algorithms and additional  guidance. Performed at [facility], [place] [address] [place], [place] [address]   CBG monitoring, ED     Status: Abnormal   Collection Time: 04/24/21  8:54 AM  Result Value Ref Range   Glucose-Capillary 264 (H) 70 - 99 mg/dL    Comment: Glucose reference range applies only to samples taken after fasting for at least 8 hours.  TSH     Status: None   Collection Time: 04/24/21 12:23 PM  Result Value Ref Range   TSH 1.901 0.350 - 4.500 uIU/mL    Comment: Performed by a 3rd Generation assay with a functional sensitivity of <=0.01 uIU/mL. Performed at [facility], [place] [address] [place], [place] [address]   Brain natriuretic peptide     Status: Abnormal   Collection Time: 04/24/21 12:23 PM  Result Value Ref Range   B Natriuretic Peptide >4,500.0 (H) 0.0 - 100.0 pg/mL    Comment: Performed at [facility] [address], [place], [place] [address]  Potassium     Status: Abnormal   Collection Time: 04/24/21 12:23 PM  Result Value Ref Range   Potassium 5.7 (H) 3.5 - 5.1 mmol/L    Comment: Performed at [facility], [place] [address], [place], [place] [address]  Troponin I (High Sensitivity)     Status: Abnormal   Collection Time: 04/24/21 12:23 PM  Result Value Ref Range   Troponin I (High Sensitivity) 222 (HH) <18 ng/L     Comment: CRITICAL RESULT CALLED TO, READ BACK BY AND VERIFIED WITH: B.MICHAELSON,RN [id number] 04/24/21 CLARK,S (NOTE) Elevated high sensitivity troponin I (hsTnI) values and significant  changes across serial measurements may suggest ACS but many other  chronic and acute conditions are known to elevate hsTnI results.  Refer to the Links section for chest pain algorithms and additional  guidance. Performed at [facility], [place] [address] [place], [place] [address]   I-Stat venous blood gas, ED     Status: Abnormal   Collection Time: 04/24/21 12:41 PM  Result Value Ref Range   pH, Ven 7.309 7.250 - 7.430   pCO2, Ven 92.2 (HH) 44.0 - 60.0 mmHg   pO2, Ven 37.0 32.0 - 45.0 mmHg   Bicarbonate 46.3 (H) 20.0 - 28.0 mmol/L   TCO2 49 (H) 22 - 32 mmol/L   O2 Saturation 61.0 %   Acid-Base Excess 17.0 (H) 0.0 - 2.0 mmol/L   Sodium 137 135 - 145 mmol/L   Potassium 5.6 (H) 3.5 - 5.1 mmol/L   Calcium, Ion 1.16 1.15 - 1.40 mmol/L   HCT 29.0 (L) 36.0 - 46.0 %   Hemoglobin 9.9 (L) 12.0 - 15.0 g/dL   Sample type VENOUS    Comment NOTIFIED PHYSICIAN   CBG monitoring, ED     Status: Abnormal   Collection Time: 04/24/21  2:28 PM  Result Value Ref Range   Glucose-Capillary 191 (H) 70 - 99 mg/dL    Comment: Glucose reference range applies only to samples taken after fasting for at least 8 hours.  Resp Panel by RT-PCR (Flu A&B, Covid) Nasopharyngeal Swab     Status: None   Collection Time: 04/24/21  2:31 PM   Specimen: Nasopharyngeal Swab; Nasopharyngeal(NP) swabs in vial transport medium  Result Value Ref Range   SARS Coronavirus 2 by RT PCR NEGATIVE NEGATIVE  Comment: (NOTE) SARS-CoV-2 target nucleic acids are NOT DETECTED.  The SARS-CoV-2 RNA is generally detectable in upper respiratory specimens during the acute phase of infection. The lowest concentration of SARS-CoV-2 viral copies this assay can detect is 138 copies/mL. A negative result does not preclude SARS-Cov-2 infection and should  not be used as the sole basis for treatment or other patient management decisions. A negative result may occur with  improper specimen collection/handling, submission of specimen other than nasopharyngeal swab, presence of viral mutation(s) within the areas targeted by this assay, and inadequate number of viral copies(<138 copies/mL). A negative result must be combined with clinical observations, patient history, and epidemiological information. The expected result is Negative.  Fact Sheet for Patients:  EntrepreneurPulse.com.au  Fact Sheet for Healthcare Providers:  IncredibleEmployment.be  This test is no t yet approved or cleared by the [place] FDA and  has been authorized for detection and/or diagnosis of SARS-CoV-2 by FDA under an Emergency Use Authorization (EUA). This EUA will remain  in effect (meaning this test can be used) for the duration of the COVID-19 declaration under Section 564(b)(1) of the Act, 21 U.S.C.section 360bbb-3(b)(1), unless the authorization is terminated  or revoked sooner.       Influenza A by PCR NEGATIVE NEGATIVE   Influenza B by PCR NEGATIVE NEGATIVE    Comment: (NOTE) The Xpert Xpress SARS-CoV-2/FLU/RSV plus assay is intended as an aid in the diagnosis of influenza from Nasopharyngeal swab specimens and should not be used as a sole basis for treatment. Nasal washings and aspirates are unacceptable for Xpert Xpress SARS-CoV-2/FLU/RSV testing.  Fact Sheet for Patients: EntrepreneurPulse.com.au  Fact Sheet for Healthcare Providers: IncredibleEmployment.be  This test is not yet approved or cleared by the [place] FDA and has been authorized for detection and/or diagnosis of SARS-CoV-2 by FDA under an Emergency Use Authorization (EUA). This EUA will remain in effect (meaning this test can be used) for the duration of the COVID-19 declaration under Section 564(b)(1)  of the Act, 21 U.S.C. section 360bbb-3(b)(1), unless the authorization is terminated or revoked.  Performed at [facility], [place] [address] [place], [place] [address]   Glucose, capillary     Status: Abnormal   Collection Time: 04/24/21  3:14 PM  Result Value Ref Range   Glucose-Capillary 191 (H) 70 - 99 mg/dL    Comment: Glucose reference range applies only to samples taken after fasting for at least 8 hours.    DG Chest 2 View  Result Date: 04/23/2021 CLINICAL DATA:  Chest pain EXAM: CHEST - 2 VIEW COMPARISON:  Choose 12/02/2018 FINDINGS: Prior median sternotomy and CABG. Similar cardiomegaly. Aortic atherosclerosis. Similar size of the small bilateral pleural effusions with adjacent atelectasis. Mild diffuse interstitial opacities. The visualized skeletal structures is unchanged. IMPRESSION: Cardiomegaly with interstitial edema and stable small bilateral pleural effusions. Electronically Signed   By: Dahlia Bailiff MD   On: 04/23/2021 23:32    Assessment/Plan Acute on chronic combined systolic and diastolic heart failure: suspected to be mixed ischemic and NICM in setting of medication nonadherence. BNP>4500. Bilateral crackles on exam with dilated IVC on POCUS exam by HF team. Given IV Lasix '60mg'$  in ED. Diuresis increased to Lasix '120mg'$  bid. Strict I&O; has historically needed inotropic support to augment diuresis. Will continue to monitor. BiDil restarted at half dose tid.  AKI ON CKD IV: sCr elevated to 3.65 on admission (baseline 2-3). Likely pre-renal in setting of CHF exacerbation as above. She denies any difficulty with urinating.  Previously thought to be multifactorial in setting of longstanding poorly controlled diabetes mellitus and hypertension. Will continue to monitor renal function. F/u renal US, urinalysis and urine lytes. Closely monitor BP on BiDil, would like to prevent hypotension to avoid ATN. Continue to hold all nephrotoxic medications and renally dose meds.   Hyperkalemia: K 6.0 on admission for which given one dose of lokelma with improvement to 5.7. No significant EKG changes noted. Likely 2/2 AKI on CKDIV as above. Continue lokelma. Trend potassium.  Metabolic alkalosis: This seems to be chronic. Possibly in setting of diuresis.  Not a good candidate for diamox due to compromised renal function. She does not need urgent CRRT at this time. Will continue to trend.  Urinary retention: Concerns for urinary retention on bedside POCUS. Only had 150cc dark urine output. Has Purewick, encouraged to use this.  Will monitor PVR's; if continues to retain will likely need foley catheter.  Macrocytic anemia w/hx of normocytic anemia: prior iron studies wnl. F/u vitamin B12 and folate Type II DM: insulin dosing per primary team    Harvie Heck IMTS PGY-3 04/24/2021, 4:51 PM Pager #: [phone]

## 2021-04-24 NOTE — Plan of Care (Signed)

## 2021-04-24 NOTE — Consult Note (Addendum)
Advanced Heart Failure Team Consult Note   Primary Physician:Dr. Banks Primary Cardiologist:  Dr. Haroldine Laws  Reason for Consultation: Acute on chronic Combined Systolic and Diastolic CHF  HPI:    Beverly Martin is seen today for evaluation of Acute on chronic Combined Systolic and Diastolic CHF at the request of Tamala Julian, [place].   She has a PMH significant for HFrEF, CAD s/p CABG, HTN, T2DM, HLD, CKD IV.   Diagnosed with diabetes 2005, and had 3 vessel cardiac bypass surgery in 2006 at [facility] possibly with Dr. Nils Pyle.  Said she didn't have good follow up afterwards.  She did continue her aspirin.     Urosepsis 2019 with ATN cr peaked at 2.0 but improved before d/c to 1.1   Admitted 01/03/21 for acute on chronic systolic CHF diuresed poorly on IV lasix with worsening cr ultimately discharged heavier than when she arrived.  Her Echo 01/03/21:  EF 20-25% with severe RV dysfunction (previous EF 40-45%). Felt to be mixed ischemic/NNICM but unable to perform coronary angio due to CKD.   Seen in [facility] and failed outpatient diuresis so was admitted on 01/17/21 with marked volume overload/ADHF. RHC showed marked volume overload with biventricular failure and normal CO.  Milrinone added to augment diuresis. Once euvolemic milrinone stopped. Diuresed 25 pounds. Creatinine improved 3.5 -> 2.9. Discharged on bidil + torsemide 40 mg daily. Discharge weight 115 pounds.   Never followed up with nephrology on discharge   Admitted 03/14/21 w/ complaints of weakness, near syncope and dyspnea, found to be in acute CHF, BNP>4500. Her blood sugar was >400. She received IV lasix 40 mg. She declined home health and was referred to Palliative Care outpatient.   Last seen in APP AHF clinic 04/10/21 drinking a lot of fluid and eating ice, weight increased, REDS 54%, Torsemide increased to 60 mg bid x 3 days then back to 40 BID.    Saw patient in the ED today where she presented last night with she  describes as hyperglycemia, somnolence, tremor and poor understanding of how to use insulin pen she was prescribed by her PCP.  She denies chest pain or dyspnea. Attempted to call the husband to get further history but no answer at both numbers. Her workup was significant for  troponin 29>58, ecg with NSR rate 97, LVH.  Chest x-ray with bilateral interstitial edema and small bilateral pleural effusions   Cardiac Testing RHC 01/2021 RA = 15 RV = 72/21 PA = 71/22 (40) PCW = 22 (v-wave 29) Fick cardiac output/index = 6.3/3.7 Thermo CO/CI = 4.3/2.6 PVR = 2.9 (Fick) Ao sat = 99% PA sat = 71%. 72% SVC sat = 66%    Assessment: 1. Elevated biventricular pressures 2. Normal cardiac output   - Echo (3/22):  LVEF has decreased from 45% (8/21) to 20-25%, right ventricular systolic function is severely decreased. RV normal  Review of Systems: [y] = yes, '[ ]'$  = no   General: Weight gain '[ ]'$ ; Weight loss '[ ]'$ ; Anorexia '[ ]'$ ; Fatigue [ y]; Fever '[ ]'$ ; Chills '[ ]'$ ; Weakness '[ ]'$   Cardiac: Chest pain/pressure '[ ]'$ ; Resting SOB '[ ]'$ ; Exertional SOB [ y]; Orthopnea '[ ]'$ ; Pedal Edema Blue.Reese ]; Palpitations '[ ]'$ ; Syncope '[ ]'$ ; Presyncope '[ ]'$ ; Paroxysmal nocturnal dyspnea'[ ]'$   Pulmonary: Cough '[ ]'$ ; Wheezing'[ ]'$ ; Hemoptysis'[ ]'$ ; Sputum '[ ]'$ ; Snoring '[ ]'$   GI: Vomiting'[ ]'$ ; Dysphagia'[ ]'$ ; Melena'[ ]'$ ; Hematochezia '[ ]'$ ; Heartburn'[ ]'$ ; Abdominal pain '[ ]'$ ;  Constipation '[ ]'$ ; Diarrhea '[ ]'$ ; BRBPR '[ ]'$   GU: Hematuria'[ ]'$ ; Dysuria '[ ]'$ ; Nocturia'[ ]'$   Vascular: Pain in legs with walking '[ ]'$ ; Pain in feet with lying flat '[ ]'$ ; Non-healing sores '[ ]'$ ; Stroke '[ ]'$ ; TIA '[ ]'$ ; Slurred speech '[ ]'$ ;  Neuro: Headaches'[ ]'$ ; Vertigo'[ ]'$ ; Seizures'[ ]'$ ; Paresthesias'[ ]'$ ;Blurred vision '[ ]'$ ; Diplopia '[ ]'$ ; Vision changes '[ ]'$   Ortho/Skin: Arthritis '[ ]'$ ; Joint pain [ y]; Muscle pain '[ ]'$ ; Joint swelling '[ ]'$ ; Back Pain '[ ]'$ ; Rash '[ ]'$   Psych: Depression'[ ]'$ ; Anxiety'[ ]'$   Heme: Bleeding problems '[ ]'$ ; Clotting disorders '[ ]'$ ; Anemia '[ ]'$   Endocrine: Diabetes '[ ]'$ ; Thyroid  dysfunction'[ ]'$   Home Medications Prior to Admission medications   Medication Sig Start Date End Date Taking? Authorizing Provider  aspirin EC 81 MG EC tablet Take 1 tablet (81 mg total) by mouth daily. Swallow whole. 06/19/20 06/14/21  Kayleen Memos, DO  atorvastatin (LIPITOR) 40 MG tablet Take 1 tablet (40 mg total) by mouth daily. Patient taking differently: Take 40 mg by mouth daily. She has been taking 80 mg daily. 04/09/21   Billie Ruddy, MD  calcitRIOL (ROCALTROL) 0.25 MCG capsule TAKE 1 CAPSULE BY MOUTH EVERY DAY Patient not taking: Reported on 04/10/2021 04/03/21   Billie Ruddy, MD  hydrALAZINE (APRESOLINE) 25 MG tablet Take 1 tablet (25 mg total) by mouth 3 (three) times daily. Patient taking differently: Take 25 mg by mouth 3 (three) times daily. She has been taking 50 mg bid 04/09/21 04/09/22  Billie Ruddy, MD  insulin glargine (LANTUS) 100 unit/mL SOPN Inject 4 Units into the skin at bedtime. 03/20/21   Billie Ruddy, MD  Insulin Pen Needle 32G X 4 MM MISC 4 Units by Does not apply route at bedtime. 03/20/21   Billie Ruddy, MD  isosorbide mononitrate (IMDUR) 60 MG 24 hr tablet Take 1 tablet (60 mg total) by mouth daily. 04/09/21 04/09/22  Billie Ruddy, MD  torsemide (DEMADEX) 20 MG tablet Take 20 mg by mouth 2 (two) times daily.    [provider]    Past Medical History: Past Medical History:  Diagnosis Date   CAD (coronary artery disease)    Combined systolic and diastolic ACC/AHA stage C congestive heart failure ([place])    Diabetes mellitus without complication ([place])    Essential hypertension    Hyperlipidemia     Past Surgical History: Past Surgical History:  Procedure Laterality Date   CARDIAC SURGERY     CABG   CORONARY ARTERY BYPASS GRAFT     RIGHT HEART CATH N/A 01/18/2021   Procedure: RIGHT HEART CATH;  Surgeon: Jolaine Artist, MD;  Location: [facility];  Service: Cardiovascular;  Laterality: N/A;    Family History: Family History   Problem Relation Age of Onset   Diabetes Mellitus II Mother    Esophageal cancer Father     Social History: Social History   Socioeconomic History   Marital status: Married    Spouse name: Not on file   Number of children: Not on file   Years of education: Not on file   Highest education level: Not on file  Occupational History   Not on file  Tobacco Use   Smoking status: Never   Smokeless tobacco: Never  Vaping Use   Vaping Use: Never used  Substance and Sexual Activity   Alcohol use: No   Drug use: No   Sexual activity:  Not Currently  Other Topics Concern   Not on file  Social History Narrative   Not on file   Social Determinants of Health   Financial Resource Strain: Low Risk    Difficulty of Paying Living Expenses: Not hard at all  Food Insecurity: No Food Insecurity   Worried About [place] in the Last Year: Never true   [place] in the Last Year: Never true  Transportation Needs: No Transportation Needs   Lack of Transportation (Medical): No   Lack of Transportation (Non-Medical): No  Physical Activity: Insufficiently Active   Days of Exercise per Week: 2 days   Minutes of Exercise per Session: 20 min  Stress: Not on file  Social Connections: Not on file    Allergies:  No Known Allergies  Objective:    Vital Signs:   Temp:  [98.2 F (36.8 C)-98.3 F (36.8 C)] 98.2 F (36.8 C) (07/13 0356) Pulse Rate:  [81-96] 81 (07/13 0730) Resp:  [16-19] 17 (07/13 0730) BP: (132-152)/(69-77) 132/70 (07/13 0730) SpO2:  [100 %] 100 % (07/13 0730) Weight:  [53.5 kg] 53.5 kg (07/12 2253)    Weight change: Filed Weights   04/23/21 2253  Weight: 53.5 kg    Intake/Output:  No intake or output data in the 24 hours ending 04/24/21 0958    Physical Exam    General:  Well appearing. No resp difficulty HEENT: normal Neck: supple. Carotids 2+ bilat; no bruits. No lymphadenopathy or thyromegaly appreciated. Cor: PMI nondisplaced. Regular  rate & rhythm. No rubs, gallops or murmurs. Lungs: bibasilar rales, shallow respirations Abdomen: soft, nontender, nondistended. No hepatosplenomegaly. No bruits or masses. Good bowel sounds. Extremities: no cyanosis, clubbing, rash, 1+ LE edema bilaterally Neuro: oriented to self only, cranial nerves grossly intact. moves all 4 extremities w/o difficulty. Affect pleasant  POCUS Bladder dilated and full IVC dilated 2.0cm with less than 50% respiratory variability   Telemetry   Nsr rate 80's-90s  EKG    NSR rate 99, LVH  Labs   Basic Metabolic Panel: Recent Labs  Lab 04/23/21 2259  NA 137  K 6.0*  CL 89*  CO2 37*  GLUCOSE 384*  BUN 73*  CREATININE 3.65*  CALCIUM 9.2    Liver Function Tests: No results for input(s): AST, ALT, ALKPHOS, BILITOT, PROT, ALBUMIN in the last 168 hours. No results for input(s): LIPASE, AMYLASE in the last 168 hours. No results for input(s): AMMONIA in the last 168 hours.  CBC: Recent Labs  Lab 04/23/21 2259  WBC 7.9  HGB 8.6*  HCT 28.6*  MCV 105.5*  PLT 131*    Cardiac Enzymes: No results for input(s): CKTOTAL, CKMB, CKMBINDEX, TROPONINI in the last 168 hours.  BNP: BNP (last 3 results) Recent Labs    03/13/21 1143 03/14/21 0934 04/10/21 1129  BNP >4,500.0* >4,500.0* >4,500.0*    ProBNP (last 3 results) Recent Labs    12/06/20 0927  PROBNP >5000.0*     CBG: Recent Labs  Lab 04/24/21 0854  GLUCAP 264*    Coagulation Studies: No results for input(s): LABPROT, INR in the last 72 hours.   Imaging   DG Chest 2 View  Result Date: 04/23/2021 CLINICAL DATA:  Chest pain EXAM: CHEST - 2 VIEW COMPARISON:  Choose 12/02/2018 FINDINGS: Prior median sternotomy and CABG. Similar cardiomegaly. Aortic atherosclerosis. Similar size of the small bilateral pleural effusions with adjacent atelectasis. Mild diffuse interstitial opacities. The visualized skeletal structures is unchanged. IMPRESSION: Cardiomegaly with interstitial  edema  and stable small bilateral pleural effusions. Electronically Signed   By: Dahlia Bailiff MD   On: 04/23/2021 23:32     Medications:     Current Medications:  furosemide  60 mg Intravenous BID   heparin  5,000 Units Subcutaneous Q8H   insulin aspart  0-6 Units Subcutaneous TID WC   sodium chloride flush  3 mL Intravenous Q12H   sodium zirconium cyclosilicate  5 g Oral Once    Infusions:  sodium chloride      Assessment/Plan   1. Acute on Chronic Combined Systolic and Diastolic CHF: - Likely mixed ischemic and NICM, poorly controlled HTN, poor medication adherence - Echo 12/2020 EF down from 45-50% in 2019 to 20-25% RV normal. - NYHA II-III. Subjectively always says breathing is doing well, Volume status elevated mainly pulm edema less LE edema this time, does have dilated IVC with <50% variation on [place] - GDMT limited by renal function. (No ARNi with low GFR, no bb with severely low EF and decompensation, No SGLT2i with h/o urosepsis and renal function). - restart bidil but at 1/2tab - switch lasix to '120mg'$  BID hopefully she will be able to mobilize fluid without support   2. T2DM: - Hgb A1C 9.1 (6/22). - Not a candidate for SGLT2i (GFR and hx of urosepsis).   3. HTN: - BP okay restarting bidil but at lower dose   4. AKI on CKD Stage 4: - SCr baseline 2.8-3.2 - 3.7 on admission - nephrology on board, watch closely with diuresis   5. Tremor, confusion, somnolence -she does have some confusion at baseline however usually no tremor or somnolence -ordered VBG returned with PCO2 92 but with normal pH -she has very shallow respirations and pleural effusions,  has a chronic respiratory acidosis but baseline PCO2 around 60, this may be worsened by volume overload -Continue diuresis and may benefit from bipap in the short term -ideally could use diamox with diuretic however there is risk with worsened renal fx  6. Urinary retention: -bladder is full on [place], had difficulty  coaching her to use purwick, discussed with nurse at bedside, she will try bedside commode but ultimately may need I/O cath vs foley.    7. Hyperkalemia: -on lasix, lokelma -improve respiratory acidosis as above  Length of Stay: 0  Katherine Roan, MD  04/24/2021, 9:58 AM  Advanced Heart Failure Team Pager [phone] (M-F; 7a - 4p)  Please contact [facility] for night-coverage after hours (4p -7a ) and weekends on [email or web address]    Patient seen and examined with the above-signed Advanced Practice Provider and/or Housestaff. I personally reviewed laboratory data, imaging studies and relevant notes. I independently examined the patient and formulated the important aspects of the plan. I have edited the note to reflect any of my changes or salient points. I have personally discussed the plan with the patient and/or family.  65 y/o woman with DM2, CKD4 and chronic systolic HF with EF  0000000  Multiple recent admissions for HF  Presents with recurrent volume overload Scr up from 3.0 -> 3.7  General:  Elderly appearing on bipap  HEENT: normal Neck: supple. JVP to ear . Carotids 2+ bilat; no bruits. No lymphadenopathy or thryomegaly appreciated. Cor: PMI nondisplaced. Regular rate & rhythm. No rubs, gallops or murmurs. Lungs: + crackles  Abdomen: soft, nontender, nondistended. No hepatosplenomegaly. No bruits or masses. Good bowel sounds. Extremities: no cyanosis, clubbing, rash, 2+ edema Neuro: alert & orientedx3, cranial nerves grossly intact. moves all 4 extremities  w/o difficulty. Affect pleasant  She has recurrent volume overload in setting of advanced renal disease. Will attempt IV diuresis with high-dose lasix. Nephrology has also been consulted.  She seems to be heading toward HD but may not be suitable candidate.   Will repeat echo.   Suspect she may have ischemic CM but have been unable to cath due to CKD IV. Can consider lexiscan myoview.   Glori Bickers, MD  8:00  PM

## 2021-04-24 NOTE — Progress Notes (Signed)
Patient received to 3E29 via stretcher accompanied by nurse tech.  Introduced to unit routine and staff.  Pt confused and disoriented / not easily oriented.  Poor memory re-call noted.  Husband in attendance.  Call light placed within reach and bed in low position with safety alarm on.  Pt states voided in emergency room in [place] prior to transfer to unit.  Bladder scan pending.

## 2021-04-24 NOTE — ED Notes (Signed)
Dr Tamala Julian notified of elevating trop of 222.

## 2021-04-24 NOTE — H&P (Addendum)
History and Physical    Beverly Martin [id number] DOB: 09-02-1956 DOA: 04/23/2021  Referring MD/NP/PA: Francia Greaves, DO PCP: Billie Ruddy, MD  Patient coming from: Home via EMS  Chief Complaint: "Sugar"  I have personally briefly reviewed patient's old medical records in [place]   HPI: Beverly Martin is a 65 y.o. female with medical history significant of HTN, HLD, combined systolic and diastolic CHF last EF 0000000 in 12/2020, CAD s/p CABG, CKD stage IV, on nasal cannula oxygen of 2 L as needed, diabetes mellitus type 2, and anemia presents with complaints of something going on with her sugar.  History was difficult to obtained from the patient as well as from her husband over the phone. She had just recently hospitalized from 6/2-6/3 for combined systolic and diastolic CHF with acute on chronic kidney disease.  Patient had been diuresed and discharged home with plans for follow-up with nephrology in outpatient setting.  Patient reports that she was never called for follow-up appointment with nephrology.  Furthermore, when she was discharged from hospital  she was given prescription for a insulin pen that her and her husband did not know how to use it.  Patient reports that they had tried to use it 2 or 3 times.  Her blood sugars at home had been reading high.  Yesterday, patient reports that she was getting her days mixed up.  Also complained some chest pain felt like a sting across her chest that she rated a 1 out of 10 on the pain scale.  Her husband states that she seemed out of it and barely could talk, but told him to call 911.  Denies having any vomiting, fever, cough, dysuria, or blood in her stool.  She has been having some nausea, epigastric abdominal pain, leg swelling, and leg soreness.  Patient reports that her weight has fluctuated up and down and that she has been sleeping in a chair for some unknown period in time.  She had been using all of the other medications as  prescribed and had followed up with cardiology on 6/29 for BNP at that time was greater than 4500.  In route with EMS patient was noted to be 89% on 2 L nasal cannula oxygen with improvement up to 99% on 4 L.  Patient had been given aspirin 243 mg and nitroglycerin 0.4 mg x2.  ED Course: Upon admission into the emergency department patient seen afebrile with blood pressures 135/69-152/73, and O2 saturations currently maintained on 2.5 L after being weaned down from 4 L of nasal cannula oxygen.  Labs significant for hemoglobin 8.6, platelets 131, potassium 6, BUN 73, creatinine 3.65, glucose 384, high-sensitivity troponin 29-> 58.  Chest x-ray revealed cardiomegaly with interstitial edema and stable small bilateral pleural effusions.  Review of Systems  Constitutional:  Positive for malaise/fatigue. Negative for fever.  HENT:  Negative for congestion.   Eyes:  Negative for photophobia and pain.  Respiratory:  Positive for shortness of breath. Negative for cough.   Cardiovascular:  Positive for chest pain and leg swelling.  Gastrointestinal:  Positive for abdominal pain and nausea. Negative for blood in stool, diarrhea and vomiting.  Genitourinary:  Negative for dysuria and hematuria.  Musculoskeletal:  Positive for myalgias. Negative for falls.  Skin:  Negative for rash.  Neurological:  Negative for focal weakness and loss of consciousness.  Psychiatric/Behavioral:  Negative for substance abuse.    Past Medical History:  Diagnosis Date   CAD (coronary artery disease)  Diabetes mellitus without complication ([place])    Essential hypertension    Hyperlipidemia     Past Surgical History:  Procedure Laterality Date   CARDIAC SURGERY     CABG   CORONARY ARTERY BYPASS GRAFT     RIGHT HEART CATH N/A 01/18/2021   Procedure: RIGHT HEART CATH;  Surgeon: Jolaine Artist, MD;  Location: [facility];  Service: Cardiovascular;  Laterality: N/A;     reports that she has never smoked. She  has never used smokeless tobacco. She reports that she does not drink alcohol and does not use drugs.  No Known Allergies  Family History  Problem Relation Age of Onset   Diabetes Mellitus II Mother    Esophageal cancer Father     Prior to Admission medications   Medication Sig Start Date End Date Taking? Authorizing Provider  aspirin EC 81 MG EC tablet Take 1 tablet (81 mg total) by mouth daily. Swallow whole. 06/19/20 06/14/21  Kayleen Memos, DO  atorvastatin (LIPITOR) 40 MG tablet Take 1 tablet (40 mg total) by mouth daily. Patient taking differently: Take 40 mg by mouth daily. She has been taking 80 mg daily. 04/09/21   Billie Ruddy, MD  calcitRIOL (ROCALTROL) 0.25 MCG capsule TAKE 1 CAPSULE BY MOUTH EVERY DAY Patient not taking: Reported on 04/10/2021 04/03/21   Billie Ruddy, MD  hydrALAZINE (APRESOLINE) 25 MG tablet Take 1 tablet (25 mg total) by mouth 3 (three) times daily. Patient taking differently: Take 25 mg by mouth 3 (three) times daily. She has been taking 50 mg bid 04/09/21 04/09/22  Billie Ruddy, MD  insulin glargine (LANTUS) 100 unit/mL SOPN Inject 4 Units into the skin at bedtime. 03/20/21   Billie Ruddy, MD  Insulin Pen Needle 32G X 4 MM MISC 4 Units by Does not apply route at bedtime. 03/20/21   Billie Ruddy, MD  isosorbide mononitrate (IMDUR) 60 MG 24 hr tablet Take 1 tablet (60 mg total) by mouth daily. 04/09/21 04/09/22  Billie Ruddy, MD  torsemide (DEMADEX) 20 MG tablet Take 20 mg by mouth 2 (two) times daily.    [provider]    Physical Exam:  Constitutional: Elderly female who appears to be in no acute distress at this time Vitals:   04/24/21 0530 04/24/21 0600 04/24/21 0630 04/24/21 0700  BP: 139/73 137/71 138/77 (!) 142/73  Pulse: 83 83 81 81  Resp: '16 16 19 19  '$ Temp:      TempSrc:      SpO2: 100% 100% 100% 100%  Weight:      Height:       Eyes: PERRL, lids and conjunctivae normal ENMT: Mucous membranes are moist. Posterior  pharynx clear of any exudate or lesions.  Neck: normal, supple, no masses, no thyromegaly. JVD appreciated Respiratory: Decreased overall aeration with positive crackles appreciated in both lung fields.  Currently on 2.5 liters of nasal cannula oxygen with O2 saturations maintained. Cardiovascular: Regular rate and rhythm with positive systolic murmur.  At least +1 pitting lower extremity edema. Abdomen: no tenderness, no masses palpated. No hepatosplenomegaly. Bowel sounds positive.  Musculoskeletal: No joint deformity upper and lower extremities. Good ROM, no contractures. Normal muscle tone.  Skin: no rashes, lesions, ulcers. No induration Neurologic: CN 2-12 grossly intact.  Patient able to move all extremities. Psychiatric: Alert and oriented x3.   Labs on Admission: I have personally reviewed following labs and imaging studies  CBC: Recent Labs  Lab 04/23/21  2259  WBC 7.9  HGB 8.6*  HCT 28.6*  MCV 105.5*  PLT [id number]*   Basic Metabolic Panel: Recent Labs  Lab 04/23/21 2259  NA 137  K 6.0*  CL 89*  CO2 37*  GLUCOSE 384*  BUN 73*  CREATININE 3.65*  CALCIUM 9.2   GFR: Estimated Creatinine Clearance: 12.8 mL/min (A) (by C-G formula based on SCr of 3.65 mg/dL (H)). Liver Function Tests: No results for input(s): AST, ALT, ALKPHOS, BILITOT, PROT, ALBUMIN in the last 168 hours. No results for input(s): LIPASE, AMYLASE in the last 168 hours. No results for input(s): AMMONIA in the last 168 hours. Coagulation Profile: No results for input(s): INR, PROTIME in the last 168 hours. Cardiac Enzymes: No results for input(s): CKTOTAL, CKMB, CKMBINDEX, TROPONINI in the last 168 hours. BNP (last 3 results) Recent Labs    12/06/20 0927  PROBNP >5000.0*   HbA1C: No results for input(s): HGBA1C in the last 72 hours. CBG: No results for input(s): GLUCAP in the last 168 hours. Lipid Profile: No results for input(s): CHOL, HDL, LDLCALC, TRIG, CHOLHDL, LDLDIRECT in the last 72  hours. Thyroid Function Tests: No results for input(s): TSH, T4TOTAL, FREET4, T3FREE, THYROIDAB in the last 72 hours. Anemia Panel: No results for input(s): VITAMINB12, FOLATE, FERRITIN, TIBC, IRON, RETICCTPCT in the last 72 hours. Urine analysis:    Component Value Date/Time   COLORURINE YELLOW 01/18/2021 2000   APPEARANCEUR CLEAR 01/18/2021 2000   LABSPEC 1.009 01/18/2021 2000   PHURINE 6.0 01/18/2021 2000   GLUCOSEU NEGATIVE 01/18/2021 2000   HGBUR NEGATIVE 01/18/2021 2000   BILIRUBINUR NEGATIVE 01/18/2021 2000   KETONESUR NEGATIVE 01/18/2021 2000   PROTEINUR 30 (A) 01/18/2021 2000   UROBILINOGEN 0.2 10/20/2017 1115   NITRITE NEGATIVE 01/18/2021 2000   LEUKOCYTESUR NEGATIVE 01/18/2021 2000   Sepsis Labs: No results found for this or any previous visit (from the past 240 hour(s)).   Radiological Exams on Admission: DG Chest 2 View  Result Date: 04/23/2021 CLINICAL DATA:  Chest pain EXAM: CHEST - 2 VIEW COMPARISON:  Choose 12/02/2018 FINDINGS: Prior median sternotomy and CABG. Similar cardiomegaly. Aortic atherosclerosis. Similar size of the small bilateral pleural effusions with adjacent atelectasis. Mild diffuse interstitial opacities. The visualized skeletal structures is unchanged. IMPRESSION: Cardiomegaly with interstitial edema and stable small bilateral pleural effusions. Electronically Signed   By: Dahlia Bailiff MD   On: 04/23/2021 23:32    EKG: Independently reviewed.  Sinus rhythm at 99 bpm with right axis deviation and LVH similar to previous tracings  Assessment/Plan Respiratory failure with hypoxia secondary to combined systolic and diastolic congestive heart failure exacerbation: Acute on chronic patient presents with complaints of chest pain and shortness of breath.  O2 saturations were reported to be around 89% on home 2 L of nasal cannula oxygen.  On physical exam patient with at least +1 pitting edema, crackles on lung exam, and JVD.  Chest x-ray noting  cardiomegaly with interstitial edema and small bilateral pleural effusions.  Just recently hospitalized last month for the same. -Admit to a telemetry bed -Heart failure orders set  initiated  -Continuous pulse oximetry with nasal cannula oxygen as needed to keep O2 saturations >92% -Strict I&Os and daily weights -Elevate lower extremities -Check BNP and TSH -Lasix 60 mg IV x1 dose, and switched to 120 mg IV twice daily per cardiology -Reassess in a.m. and adjust diuresis as needed -No ACE/ARB/spironolactone secondary to CKD and hyperkalemia -PT to eval and treat in a.m. -Cardiology consulted, we will follow-up  for further recommendation  Hyperkalemia: Acute.  On admission potassium elevated up to 6.  Patient was given Lokelma 5 g p.o. x1 dose. -Given additional Lokelma 5 g p.o. x1 dose. -Recheck potassium at 12  Elevated troponin CAD s/p CABG: Acute on chronic.  High-sensitivity troponin 29-> 58, but elevated previously in the past..  EKG similar to prior tracings.  Suspect secondary to demand -Continue to trend  Acute kidney injury superimposed on chronic kidney disease stage IV: Patient presents with creatinine elevated up to 3.65 with BUN 73.  Baseline creatinine previously have been around 2.9.  Suspect acute worsening of kidney function secondary to hypoperfusion related with CHF exacerbation -Continue to monitor kidney function with diuresis -Nephrology formally consulted, will follow-up for any further recommendation  Metabolic alkalosis: Patient had metabolic alkalosis on admission.  Previous venous blood gases did not note CO2 retention. -Check venous pH  Diabetes mellitus type 2 uncontrolled: On admission patient's glucose elevated up to 384.  Anion gap was noted to be within normal limits.  Last hemoglobin A1c was 9.1 on 03/14/2021.  Patient reports being out of medication -Hypoglycemic protocol -CBGs before every meal with very sensitive SSI -Diabetic education  consult  Macrocytic anemia: Acute.  Hemoglobin 8.6 with MCV 105.5.  Hemoglobin appears relatively stable when compared to previous on 6/2, but MCV is acutely elevated. -Check vitamin B12 and folate in a.m. -Recheck CBC in a.m.  Essential hypertension: Home blood pressure medications include hydralazine 25 mg 3 times daily and isosorbide mononitrate 60 mg daily. -Continue home blood pressure medication as tolerated  Thrombocytopenia: Chronic.  Platelet count 131 which appears similar to previous checks intermittently in the past.  No reports of bleeding. -Continue to monitor  DVT prophylaxis: Heparin Code Status: Full Family Communication: Husband updated over the phone Disposition Plan: Likely discharge home once medically stable Consults called: Cardiology, nephrology Admission status: Inpatient, require more than 2 midnight stay  Norval Morton MD [facility]   If 7PM-7AM, please contact night-coverage   04/24/2021, 7:09 AM

## 2021-04-25 ENCOUNTER — Inpatient Hospital Stay (HOSPITAL_COMMUNITY): Payer: BC Managed Care – PPO

## 2021-04-25 DIAGNOSIS — I5043 Acute on chronic combined systolic (congestive) and diastolic (congestive) heart failure: Secondary | ICD-10-CM

## 2021-04-25 DIAGNOSIS — I5021 Acute systolic (congestive) heart failure: Secondary | ICD-10-CM | POA: Diagnosis not present

## 2021-04-25 DIAGNOSIS — I2510 Atherosclerotic heart disease of native coronary artery without angina pectoris: Secondary | ICD-10-CM

## 2021-04-25 DIAGNOSIS — E875 Hyperkalemia: Secondary | ICD-10-CM

## 2021-04-25 DIAGNOSIS — I10 Essential (primary) hypertension: Secondary | ICD-10-CM

## 2021-04-25 LAB — URINALYSIS, MICROSCOPIC (REFLEX): WBC, UA: >50 WBC/hpf (ref 0–5)

## 2021-04-25 LAB — CBC WITH DIFFERENTIAL/PLATELET
Abs Immature Granulocytes: 0.02 10*3/uL (ref 0.00–0.07)
Basophils Absolute: 0 10*3/uL (ref 0.0–0.1)
Basophils Relative: 0 %
Eosinophils Absolute: 0 10*3/uL (ref 0.0–0.5)
Eosinophils Relative: 0 %
HCT: 26.2 % — ABNORMAL LOW (ref 36.0–46.0)
Hemoglobin: 7.9 g/dL — ABNORMAL LOW (ref 12.0–15.0)
Immature Granulocytes: 0 %
Lymphocytes Relative: 6 %
Lymphs Abs: 0.4 10*3/uL — ABNORMAL LOW (ref 0.7–4.0)
MCH: 31 pg (ref 26.0–34.0)
MCHC: 30.2 g/dL (ref 30.0–36.0)
MCV: 102.7 fL — ABNORMAL HIGH (ref 80.0–100.0)
Monocytes Absolute: 0.4 10*3/uL (ref 0.1–1.0)
Monocytes Relative: 7 %
Neutro Abs: 4.8 10*3/uL (ref 1.7–7.7)
Neutrophils Relative %: 87 %
Platelets: 113 10*3/uL — ABNORMAL LOW (ref 150–400)
RBC: 2.55 MIL/uL — ABNORMAL LOW (ref 3.87–5.11)
RDW: 14 % (ref 11.5–15.5)
WBC: 5.6 10*3/uL (ref 4.0–10.5)
nRBC: 0 % (ref 0.0–0.2)

## 2021-04-25 LAB — BASIC METABOLIC PANEL
Anion gap: 9 (ref 5–15)
Anion gap: 10 (ref 5–15)
BUN: 81 mg/dL — ABNORMAL HIGH (ref 8–23)
BUN: 81 mg/dL — ABNORMAL HIGH (ref 8–23)
CO2: 42 mmol/L — ABNORMAL HIGH (ref 22–32)
CO2: 42 mmol/L — ABNORMAL HIGH (ref 22–32)
Calcium: 9.3 mg/dL (ref 8.9–10.3)
Calcium: 8.8 mg/dL — ABNORMAL LOW (ref 8.9–10.3)
Chloride: 90 mmol/L — ABNORMAL LOW (ref 98–111)
Chloride: 88 mmol/L — ABNORMAL LOW (ref 98–111)
Creatinine, Ser: 3.72 mg/dL — ABNORMAL HIGH (ref 0.44–1.00)
Creatinine, Ser: 3.69 mg/dL — ABNORMAL HIGH (ref 0.44–1.00)
GFR, Estimated: 13 mL/min — ABNORMAL LOW (ref >60)
GFR, Estimated: 13 mL/min — ABNORMAL LOW (ref >60)
Glucose, Bld: 176 mg/dL — ABNORMAL HIGH (ref 70–99)
Glucose, Bld: 290 mg/dL — ABNORMAL HIGH (ref 70–99)
Potassium: 6 mmol/L — ABNORMAL HIGH (ref 3.5–5.1)
Potassium: 5 mmol/L (ref 3.5–5.1)
Sodium: 141 mmol/L (ref 135–145)
Sodium: 140 mmol/L (ref 135–145)

## 2021-04-25 LAB — ECHOCARDIOGRAM COMPLETE
Area-P 1/2: 3.99 cm2
Calc EF: 34.2 %
Height: 64 in
S' Lateral: 3.9 cm
Single Plane A2C EF: 34.2 %
Single Plane A4C EF: 31.2 %
Weight: 1889.6 oz

## 2021-04-25 LAB — URINALYSIS, ROUTINE W REFLEX MICROSCOPIC
Bilirubin Urine: NEGATIVE
Glucose, UA: 100 mg/dL — AB
Ketones, ur: NEGATIVE mg/dL
Nitrite: NEGATIVE
Protein, ur: 30 mg/dL — AB
Specific Gravity, Urine: 1.015 (ref 1.005–1.030)
WBC, UA: >50 WBC/hpf — ABNORMAL HIGH (ref 0–5)
pH: 5.5 (ref 5.0–8.0)

## 2021-04-25 LAB — GLUCOSE, CAPILLARY
Glucose-Capillary: 134 mg/dL — ABNORMAL HIGH (ref 70–99)
Glucose-Capillary: 285 mg/dL — ABNORMAL HIGH (ref 70–99)
Glucose-Capillary: 260 mg/dL — ABNORMAL HIGH (ref 70–99)
Glucose-Capillary: 103 mg/dL — ABNORMAL HIGH (ref 70–99)

## 2021-04-25 LAB — SODIUM, URINE, RANDOM: Sodium, Ur: 62 mmol/L

## 2021-04-25 LAB — CREATININE, URINE, RANDOM: Creatinine, Urine: 65.24 mg/dL

## 2021-04-25 LAB — FOLATE: Folate: 12.6 ng/mL (ref >5.9)

## 2021-04-25 LAB — VITAMIN B12: Vitamin B-12: 424 pg/mL (ref 180–914)

## 2021-04-25 LAB — CHLORIDE, URINE, RANDOM: Chloride Urine: 63 mmol/L

## 2021-04-25 MED ORDER — DARBEPOETIN ALFA 60 MCG/0.3ML IJ SOSY
60.0000 ug | PREFILLED_SYRINGE | INTRAMUSCULAR | Status: DC
  Administered 2021-04-25: 60 ug via SUBCUTANEOUS
  Filled 2021-04-25: qty 0.3

## 2021-04-25 MED ORDER — SODIUM ZIRCONIUM CYCLOSILICATE 10 G PO PACK
10.0000 g | PACK | Freq: Once | ORAL | Status: AC
  Administered 2021-04-25: 10 g via ORAL
  Filled 2021-04-25: qty 1

## 2021-04-25 MED ORDER — ISOSORB DINITRATE-HYDRALAZINE 20-37.5 MG PO TABS
1.0000 | ORAL_TABLET | Freq: Three times a day (TID) | ORAL | Status: DC
  Administered 2021-04-25 – 2021-04-30 (×17): 1 via ORAL
  Filled 2021-04-25 (×16): qty 1

## 2021-04-25 MED ORDER — FUROSEMIDE 10 MG/ML IJ SOLN
120.0000 mg | Freq: Two times a day (BID) | INTRAVENOUS | Status: DC
  Administered 2021-04-25 – 2021-04-29 (×9): 120 mg via INTRAVENOUS
  Filled 2021-04-25: qty 12
  Filled 2021-04-25: qty 10
  Filled 2021-04-25: qty 12
  Filled 2021-04-25: qty 10
  Filled 2021-04-25: qty 12
  Filled 2021-04-25 (×3): qty 10
  Filled 2021-04-25 (×2): qty 12

## 2021-04-25 NOTE — Progress Notes (Signed)
[facility] Resident Progress Note   Subjective:   HD 1 Beverly Martin is sitting up in bed, no acute distress. Reports her breathing is better.   Objective Vitals:   04/25/21 0018 04/25/21 0446 04/25/21 0754 04/25/21 0756  BP: 139/68 135/71 140/73   Pulse: 87 82 83 83  Resp: '18 18 19 18  '$ Temp: (!) 97.5 F (36.4 C) (!) 97.5 F (36.4 C) 98.1 F (36.7 C)   TempSrc: Oral Oral Oral   SpO2: 100% 100% 100% 100%  Weight:  53.6 kg    Height:       Physical Exam General: chronically ill appearing elderly thin female, no acute distress Heart: RRR, S1 and S2 present, no mr/g Lungs: bibasilar crackles, on 2L O2 Abdomen: nondistended, soft, nontender, +BS Extremities:trace bilat lower extremity edema; distal pulses present  Dialysis Access: None   Additional Objective Labs: Basic Metabolic Panel: Recent Labs  Lab 04/23/21 2259 04/24/21 1223 04/24/21 1241 04/25/21 0339  NA 137  --  137 141  K 6.0* 5.7* 5.6* 6.0*  CL 89*  --   --  90*  CO2 37*  --   --  42*  GLUCOSE 384*  --   --  176*  BUN 73*  --   --  81*  CREATININE 3.65*  --   --  3.72*  CALCIUM 9.2  --   --  9.3   CBC: Recent Labs  Lab 04/23/21 2259 04/24/21 1241 04/25/21 0339  WBC 7.9  --  5.6  NEUTROABS  --   --  4.8  HGB 8.6* 9.9* 7.9*  HCT 28.6* 29.0* 26.2*  MCV 105.5*  --  102.7*  PLT 131*  --  113*   Blood Culture    Component Value Date/Time   SDES BLOOD LEFT ANTECUBITAL 10/20/2017 2003   SPECREQUEST  10/20/2017 2003    BOTTLES DRAWN AEROBIC AND ANAEROBIC Blood Culture adequate volume   CULT (A) 10/20/2017 2003    KLEBSIELLA PNEUMONIAE SUSCEPTIBILITIES PERFORMED ON PREVIOUS CULTURE WITHIN THE LAST 5 DAYS.    REPTSTATUS 10/23/2017 FINAL 10/20/2017 2003    Cardiac Enzymes: No results for input(s): CKTOTAL, CKMB, CKMBINDEX, TROPONINI in the last 168 hours. CBG: Recent Labs  Lab 04/24/21 0854 04/24/21 1428 04/24/21 1514 04/24/21 2059 04/25/21 0602  GLUCAP 264* 191* 191* 82 134*    Studies/Results: DG Chest 2 View  Result Date: 04/23/2021 CLINICAL DATA:  Chest pain EXAM: CHEST - 2 VIEW COMPARISON:  Choose 12/02/2018 FINDINGS: Prior median sternotomy and CABG. Similar cardiomegaly. Aortic atherosclerosis. Similar size of the small bilateral pleural effusions with adjacent atelectasis. Mild diffuse interstitial opacities. The visualized skeletal structures is unchanged. IMPRESSION: Cardiomegaly with interstitial edema and stable small bilateral pleural effusions. Electronically Signed   By: Dahlia Bailiff MD   On: 04/23/2021 23:32   US RENAL  Result Date: 04/24/2021 CLINICAL DATA:  65 year old female with acute renal insufficiency. EXAM: RENAL / URINARY TRACT ULTRASOUND COMPLETE COMPARISON:  None. FINDINGS: Right Kidney: Renal measurements: 8.8 x 3.7 x 3.8 cm = volume: 64 mL. There is diffuse increased renal parenchymal echogenicity. No hydronephrosis or shadowing stone. Left Kidney: Renal measurements: 9.3 x 4.6 x 3.6 cm = volume: 81 mL. Diffuse increased renal parenchymal echogenicity. No hydronephrosis or shadowing stone. Bladder: Appears normal for degree of bladder distention. Other: Small ascites. IMPRESSION: 1. Echogenic kidneys may represent medical renal disease. Clinical correlation is recommended. No hydronephrosis or shadowing stone. 2. Small ascites. Electronically Signed   By: Laren Everts.D.  On: 04/24/2021 20:41   Medications:  sodium chloride      heparin  5,000 Units Subcutaneous Q8H   insulin aspart  0-6 Units Subcutaneous TID WC   insulin glargine  5 Units Subcutaneous Daily   isosorbide-hydrALAZINE  0.5 tablet Oral TID   sodium chloride flush  3 mL Intravenous Q12H   sodium zirconium cyclosilicate  10 g Oral Once   Assessment/Plan: 1.Acute on chronic combined systolic and diastolic heart failure: suspected to be mixed ischemic and NICM in setting of medication nonadherence. BNP>4500. 1.1L UOP over past 24hrs with IV Lasix '120mg'$  bid. Continues to  be hypervolemic on exam. Has historically needed inotropic support to augment diuresis. Will continue to monitor. Cont diuresis and BiDil per HF team.  AKI ON CKD IV: Baseline sCr 2-3; on admission 3.65>3.72 this AM; GFR stable at 13. Likely pre-renal in setting of CHF exacerbation as above. Previously thought to be multifactorial in setting of longstanding poorly controlled diabetes mellitus and hypertension. Renal US with evidence of chronic renal disease without hydronephrosis or shadowing stone.Will continue to monitor renal function. Will f/u urine studies. Closely monitor BP on BiDil, would like to prevent hypotension to avoid ATN. No indication for CRRT at this time. Continue to hold all nephrotoxic medications and renally dose meds. Hyperkalemia: K remains elevated to 6.0 this AM despite lokelma dosing; likely in setting of AKI. Lokelma '10mg'$  ordered. Repeat EKG.  Metabolic alkalosis: This seems to be chronic. HCO3 increased 37>42 in setting of diuresis.  Not a good candidate for diamox due to compromised renal function. She does not need urgent CRRT at this time. Will continue to trend. Macrocytic anemia w/hx of normocytic anemia: prior iron studies wnl. Vitamin B12 and folate wnl. Started Aranesp weekly.  Type II DM: insulin dosing per primary team   Harvie Heck, MD Internal Medicine, PGY-3 04/25/21 8:11 AM Pager # [phone]

## 2021-04-25 NOTE — Progress Notes (Addendum)
Advanced Heart Failure Team Rounding Note   Primary Physician:Dr. Volanda Napoleon Primary Cardiologist:  Dr. Haroldine Laws  Reason for Consultation: Acute on chronic Combined Systolic and Diastolic CHF  HPI:    Mental status back to baseline today after bipap.  Urine output marginal, a bit better when the '120mg'$  dose was given at once instead of the split 60 and 60 from the morning.  Denies shortness of breath, chest pain.  Cr 3.65>3.72   Objective:    Vital Signs:   Temp:  [97.5 F (36.4 C)-98.2 F (36.8 C)] 98.1 F (36.7 C) (07/14 0754) Pulse Rate:  [29-93] 83 (07/14 0756) Resp:  [4-37] 18 (07/14 0756) BP: (118-140)/(64-103) 140/73 (07/14 0754) SpO2:  [91 %-100 %] 100 % (07/14 0756) Weight:  [50.5 kg-53.6 kg] 53.6 kg (07/14 0446) Last BM Date: 04/23/21  Weight change: Filed Weights   04/23/21 2253 04/24/21 1512 04/25/21 0446  Weight: 53.5 kg 50.5 kg 53.6 kg    Intake/Output:   Intake/Output Summary (Last 24 hours) at 04/25/2021 0804 Last data filed at 04/25/2021 0456 Gross per 24 hour  Intake 360 ml  Output 1100 ml  Net -740 ml      Physical Exam    General:  Well appearing. No resp difficulty HEENT: normal Neck: supple. Carotids 2+ bilat; no bruits. No lymphadenopathy or thyromegaly appreciated. Cor: PMI nondisplaced. Regular rate & rhythm. No rubs, gallops or murmurs. Lungs: bibasilar rales, shallow respirations Abdomen: soft, nontender, nondistended. No hepatosplenomegaly. No bruits or masses. Good bowel sounds. Extremities: no cyanosis, clubbing, rash, no LE edema bilaterally Neuro: alert/orientedx3, cranial nerves grossly intact. moves all 4 extremities w/o difficulty. Affect pleasant  Telemetry   Nsr rate 80's-90s  EKG    NSR rate 99, LVH  Labs   Basic Metabolic Panel: Recent Labs  Lab 04/23/21 2259 04/24/21 1223 04/24/21 1241 04/25/21 0339  NA 137  --  137 141  K 6.0* 5.7* 5.6* 6.0*  CL 89*  --   --  90*  CO2 37*  --   --  42*  GLUCOSE 384*   --   --  176*  BUN 73*  --   --  81*  CREATININE 3.65*  --   --  3.72*  CALCIUM 9.2  --   --  9.3    Liver Function Tests: No results for input(s): AST, ALT, ALKPHOS, BILITOT, PROT, ALBUMIN in the last 168 hours. No results for input(s): LIPASE, AMYLASE in the last 168 hours. No results for input(s): AMMONIA in the last 168 hours.  CBC: Recent Labs  Lab 04/23/21 2259 04/24/21 1241 04/25/21 0339  WBC 7.9  --  5.6  NEUTROABS  --   --  4.8  HGB 8.6* 9.9* 7.9*  HCT 28.6* 29.0* 26.2*  MCV 105.5*  --  102.7*  PLT 131*  --  113*    Cardiac Enzymes: No results for input(s): CKTOTAL, CKMB, CKMBINDEX, TROPONINI in the last 168 hours.  BNP: BNP (last 3 results) Recent Labs    03/14/21 0934 04/10/21 1129 04/24/21 1223  BNP >4,500.0* >4,500.0* >4,500.0*    ProBNP (last 3 results) Recent Labs    12/06/20 0927  PROBNP >5000.0*     CBG: Recent Labs  Lab 04/24/21 0854 04/24/21 1428 04/24/21 1514 04/24/21 2059 04/25/21 0602  GLUCAP 264* 191* 191* 82 134*    Coagulation Studies: No results for input(s): LABPROT, INR in the last 72 hours.   Imaging   US RENAL  Result Date: 04/24/2021 CLINICAL  DATA:  65 year old female with acute renal insufficiency. EXAM: RENAL / URINARY TRACT ULTRASOUND COMPLETE COMPARISON:  None. FINDINGS: Right Kidney: Renal measurements: 8.8 x 3.7 x 3.8 cm = volume: 64 mL. There is diffuse increased renal parenchymal echogenicity. No hydronephrosis or shadowing stone. Left Kidney: Renal measurements: 9.3 x 4.6 x 3.6 cm = volume: 81 mL. Diffuse increased renal parenchymal echogenicity. No hydronephrosis or shadowing stone. Bladder: Appears normal for degree of bladder distention. Other: Small ascites. IMPRESSION: 1. Echogenic kidneys may represent medical renal disease. Clinical correlation is recommended. No hydronephrosis or shadowing stone. 2. Small ascites. Electronically Signed   By: Anner Crete M.D.   On: 04/24/2021 20:41     Medications:      Current Medications:  heparin  5,000 Units Subcutaneous Q8H   insulin aspart  0-6 Units Subcutaneous TID WC   insulin glargine  5 Units Subcutaneous Daily   isosorbide-hydrALAZINE  0.5 tablet Oral TID   sodium chloride flush  3 mL Intravenous Q12H    Infusions:  sodium chloride      Assessment/Plan   1. Acute on Chronic Combined Systolic and Diastolic CHF: - Likely mixed ischemic and NICM, poorly controlled HTN, poor medication adherence - Echo 12/2020 EF down from 45-50% in 2019 to 20-25% RV normal. - NYHA II-III. Subjectively always says breathing is doing well, Volume status elevated mainly pulm edema less LE edema this time, urine output marginal - GDMT limited by renal function. (No ARNi with low GFR, no bb with severely low EF and decompensation, No SGLT2i with h/o urosepsis and renal function). - increase bidil to 1 tab TID - Continue lasix 120 BID, renal to manage further diuresis  - repeat ECHO   2. T2DM: - Hgb A1C 9.1 (6/22). - Not a candidate for SGLT2i (GFR and hx of urosepsis).   3. HTN: - Bidil as above   4. AKI on CKD Stage 4: - SCr baseline 2.8-3.2 - 3.65>3.72 - nephrology on board, watch closely with diuresis   5. Tremor, confusion, somnolence -2/2 hypercarbia, resolved with bipap therapy -continue diuresis, bipap prn, monitor for recurrence.    6. Hyperkalemia: -on lasix, lokelma -improve respiratory acidosis as above  Length of Stay: 1  Katherine Roan, MD  04/25/2021, 8:04 AM  Advanced Heart Failure Team Pager [phone] (M-F; 7a - 4p)  Please contact [facility] for night-coverage after hours (4p -7a ) and weekends on [email or web address]   Patient seen and examined with the above-signed Advanced Practice Provider and/or Housestaff. I personally reviewed laboratory data, imaging studies and relevant notes. I independently examined the patient and formulated the important aspects of the plan. I have edited the note to reflect any of my changes or  salient points. I have personally discussed the plan with the patient and/or family.  Less confused today. Denies SOB, orthopnea or PND. Mild diuresis. Creatinine stable at 3.7. K 6.0  General:  Lying in bed No resp difficulty HEENT: normal Neck: supple. JVP to ear Carotids 2+ bilat; no bruits. No lymphadenopathy or thryomegaly appreciated. Cor: PMI nondisplaced. Regular rate & rhythm. No rubs, gallops or murmurs. Lungs: clear Abdomen: soft, nontender, nondistended. Liver edge down. No bruits or masses. Good bowel sounds. Extremities: no cyanosis, clubbing, rash, 1+ edema Neuro: alert & orientedx3, cranial nerves grossly intact. moves all 4 extremities w/o difficulty. Affect pleasant  She remains tenuous. Still with significant volume overload but diuresis limited by renal dysfunction. Will continue lasix 120 IV bid. Can add metolazone. Would avoid using inotropes  if possible. Doubt she would tolerate HD well if needed - await renal input on this.   Has not had formal ischemic eval due to CKD 4. No angina. Can consider Myoview but doubt it would change course.   Trajectory is quite concerning. She does not realize how sick she is.   Glori Bickers, MD  11:52 AM

## 2021-04-25 NOTE — Evaluation (Signed)
Occupational Therapy Evaluation Patient Details Name: Beverly Martin MRN: [id number] DOB: 12/05/1955 Today's Date: 04/25/2021    History of Present Illness Pt is a 65 yr old female who presented due to SOB and chest pain. Pt found to have Acute on chronic combined systolic and diastolic heart failure, AKI on CKD. PMH but not limited to: CHF, chronic systolic heart faluire, UTIs, sepsis, DM2 CAD, HF, CABG   Clinical Impression   Pt admitted with above. Pt reports at PLOF they live with their husband who can assist as needed. Pt reported they were going to get a transport chair for long assistance. As they are using wheelchair when they go to the [facility]. Pt at this time reports they also sleep upright in a chair. Pt was able to complete sit to standing with supervision to min guard. However, pt reports with prolonged standing started have decrease in stability in BLE. Pt currently with functional limitations due to the deficits listed below (see OT Problem List). Pt will benefit from skilled OT to increase their safety and independence with ADL and functional mobility for ADL to facilitate discharge to venue listed below.      Follow Up Recommendations  Home health OT;Supervision/Assistance - 24 hour    Equipment Recommendations  Toilet riser    Recommendations for Other Services       Precautions / Restrictions Precautions Precautions: None Restrictions Weight Bearing Restrictions: No      Mobility Bed Mobility Overal bed mobility: Modified Independent (increase in time)             General bed mobility comments: pt reports they normally sit up in a chair    Transfers Overall transfer level: Needs assistance Equipment used: Rolling walker (2 wheeled) Transfers: Sit to/from Stand Sit to Stand: Supervision              Balance Overall balance assessment: Needs assistance Sitting-balance support: Feet supported Sitting balance-Leahy Scale: Good     Standing balance  support: Bilateral upper extremity supported Standing balance-Leahy Scale: Good                             ADL either performed or assessed with clinical judgement   ADL Overall ADL's : Needs assistance/impaired Eating/Feeding: Independent;Sitting   Grooming: Wash/dry hands;Wash/dry face;Set up;Sitting   Upper Body Bathing: Set up;Sitting   Lower Body Bathing: Moderate assistance;Sit to/from stand;Cueing for safety;Cueing for sequencing   Upper Body Dressing : Set up;Cueing for safety;Cueing for sequencing;Sitting   Lower Body Dressing: Moderate assistance;Sit to/from stand   Toilet Transfer: Min guard;Cueing for safety;Cueing for sequencing;RW   Toileting- Water quality scientist and Hygiene: Minimal assistance;Cueing for safety;Cueing for sequencing;Sit to/from stand       Functional mobility during ADLs: Min guard;Cueing for safety;Cueing for sequencing;Rolling walker General ADL Comments: Pt reports often her BLE will get weak and she will need to sit down     Vision Baseline Vision/History: Wears glasses Wears Glasses: Reading only Patient Visual Report: No change from baseline       Perception Perception Perception Tested?: No   Praxis      Pertinent Vitals/Pain Pain Assessment: No/denies pain     Hand Dominance Right   Extremity/Trunk Assessment Upper Extremity Assessment Upper Extremity Assessment: Generalized weakness   Lower Extremity Assessment Lower Extremity Assessment: Generalized weakness       Communication Communication Communication: No difficulties   Cognition Arousal/Alertness: Awake/alert Behavior During Therapy: [facility] for  tasks assessed/performed Overall Cognitive Status: Within Functional Limits for tasks assessed                                     General Comments       Exercises     Shoulder Instructions      Home Living Family/patient expects to be discharged to:: Private residence Living  Arrangements: Spouse/significant other Available Help at Discharge: Family;Available 24 hours/day Type of Home: House Home Access: Level entry     Home Layout: One level     Bathroom Shower/Tub: Walk-in shower;Tub/shower unit   Bathroom Toilet: Handicapped height Bathroom Accessibility: Yes   Home Equipment: Clinical cytogeneticist - 2 wheels (transfer bench)          Prior Functioning/Environment Level of Independence: Independent with assistive device(s)        Comments: Per pt reported sometimes husbands assist         OT Problem List: Decreased strength;Decreased activity tolerance;Impaired balance (sitting and/or standing);Decreased safety awareness;Decreased knowledge of use of DME or AE      OT Treatment/Interventions:      OT Goals(Current goals can be found in the care plan section) Acute Rehab OT Goals Patient Stated Goal: to be able to eat lunch soon OT Goal Formulation: With patient Time For Goal Achievement: 04/19/2021 Potential to Achieve Goals: Good ADL Goals Pt Will Perform Upper Body Bathing: sitting;with set-up Pt Will Perform Lower Body Bathing: with supervision;sit to/from stand Pt Will Transfer to Toilet: with supervision;ambulating;regular height toilet;grab bars Pt Will Perform Tub/Shower Transfer: Shower transfer;with supervision;shower seat;rolling walker  OT Frequency: Min 2X/week   Barriers to D/C:            Co-evaluation              AM-PAC OT "6 Clicks" Daily Activity     Outcome Measure Help from another person eating meals?: None Help from another person taking care of personal grooming?: A Little Help from another person toileting, which includes using toliet, bedpan, or urinal?: A Little Help from another person bathing (including washing, rinsing, drying)?: A Lot Help from another person to put on and taking off regular upper body clothing?: A Little Help from another person to put on and taking off regular lower body  clothing?: A Lot 6 Click Score: 17   End of Session Equipment Utilized During Treatment: Gait belt;Rolling walker Nurse Communication: Mobility status  Activity Tolerance: Patient tolerated treatment well Patient left: in chair;with call bell/phone within reach;with chair alarm set  OT Visit Diagnosis: Unsteadiness on feet (R26.81);Other abnormalities of gait and mobility (R26.89);Muscle weakness (generalized) (M62.81)                Time: [id number] OT Time Calculation (min): 42 min Charges:  OT General Charges $OT Visit: 1 Visit OT Evaluation $OT Eval Low Complexity: 1 Low OT Treatments $Self Care/Home Management : 23-37 mins  Joeseph Amor OTR/L  Acute Rehab Services  [phone] office number [phone] pager number   Joeseph Amor 04/25/2021, 11:08 AM

## 2021-04-25 NOTE — Progress Notes (Signed)
   04/25/21 1150  Mobility  Activity Refused mobility (Pt declined for unspecified reasons)

## 2021-04-25 NOTE — Progress Notes (Signed)
Pt voided approx 323m total after IV lasix given. Bladder scan obtained and showed approx 1545murine. Pt has no c/o fullness or need to void at this time. Will continue to monitor.

## 2021-04-25 NOTE — Plan of Care (Signed)

## 2021-04-25 NOTE — Progress Notes (Signed)
PROGRESS NOTE    Beverly Martin  [id number] DOB: 11-Jun-1956 DOA: 04/23/2021 PCP: Billie Ruddy, MD    Brief Narrative:  Mrs. Beverly Martin was admitted to the hospital with the working diagnosis of acute decompensation of chronic systolic heart failure.  65 year old female past medical history for hypertension, dyslipidemia, heart failure, coronary artery disease status post bypass grafting, type 2 diabetes mellitus, anemia and chronic kidney disease stage IV.  She was asked to be brought to the hospital because concerns of her blood sugars.  Apparently patient had been noted to have lower extremity swelling and orthopnea.  On her initial physical examination blood pressure 139/73, heart rate 83, respiratory rate 16, oxygen saturation 100% on supplemental oxygen, her lungs had decreased breath sounds bilaterally with positive rales, heart S1-S2, present, rhythmic, abdomen soft nontender, positive lower extremity edema.  Sodium 137, potassium 6.0, chloride 89, bicarb 37, glucose 384, BUN 73, creatinine 3.65, BNP >4500, high sensitive troponin 29-58-222, white count 7.9, hemoglobin 8.6, hematocrit 28.6, platelets 131. SARS COVID-19 negative.  Urinalysis specific gravity 1.015, >50 white cells.  Chest radiograph with cardiomegaly, hilar vascular congestion, fluid in the right fissure, small pleural effusions bilaterally.  EKG 99 bpm, rightward axis, intervals, sinus rhythm, Q-wave V1-V2, ST depressions, T wave inversion on lead II, lead III, aVF, V5-V6  Assessment & Plan:   Principal Problem:   Acute on chronic combined systolic (congestive) and diastolic (congestive) heart failure (HCC) Active Problems:   Essential hypertension   Type 2 diabetes mellitus with hyperlipidemia (HCC)   CAD (coronary artery disease)   Acute kidney injury superimposed on chronic kidney disease (HCC)   Elevated troponin   Hyperkalemia   Macrocytic anemia   Thrombocytopenia (HCC)   Acute on chronic  systolic heart failure decompensation. Patient with persistent dyspnea and peripheral edema, urine output over last 24 hrs 1,100 ml.  Renal function with serum cr at 3,69.  Plan to continue diuresis with high dose furosemide 120 mg IV bid.  Continue with Bidil for afterload reduction.   2. AKI on Ckd stage IV, hyperkalemia. Renal function with serum cr at 3,69 with K at 5,0 and serum bicarbonate at 42. BUN is 81.  Continue aggressive diuresis with loop diuretics. Continue sodium zirconium for hyperkalemia. Close follow up of renal function and electrolytes. Today with no indication for renal replacement therapy.   3. T2DM. Fasting glucose is 290, uncontrolled hyperglycemia. Continue with insulin sliding scale for glucose cover and monitoring.  Basal insulin with 5 units glargine.   4. Chronic anemia and thrombocytopenia. Hgb is 7,0 with Plt at 113. Continue close follow up, for now will hold on PRBC transfusion to prevent worsening hypervolemia.     Patient continue to be at high risk for worsening heart failure   Status is: Inpatient    Dispo: The patient is from: Home              Anticipated d/c is to: Home              Patient currently is not medically stable to d/c.   Difficult to place patient No   DVT prophylaxis: Heparin   Code Status:   full  Family Communication:  No family at the bedside     Consultants:  Cardiology  Nephrology      Subjective: Patient is feeling better but not yet back to baseline, continue to have dyspnea and edema.,   Objective: Vitals:   04/25/21 0754 04/25/21 0756 04/25/21 1207 04/25/21 1513  BP: 140/73  140/70 (!) 142/69  Pulse: 83 83 93 92  Resp: '19 18 18 18  '$ Temp: 98.1 F (36.7 C)  (!) 97.5 F (36.4 C) 98.2 F (36.8 C)  TempSrc: Oral  Oral Oral  SpO2: 100% 100% 100% 100%  Weight:      Height:        Intake/Output Summary (Last 24 hours) at 04/25/2021 1636 Last data filed at 04/25/2021 1522 Gross per 24 hour  Intake 960  ml  Output 1700 ml  Net -740 ml   Filed Weights   04/23/21 2253 04/24/21 1512 04/25/21 0446  Weight: 53.5 kg 50.5 kg 53.6 kg    Examination:   General: Not in pain or dyspnea.  Neurology: Awake and alert, non focal  E ENT: no pallor, no icterus, oral mucosa moist Cardiovascular: No JVD. S1-S2 present, rhythmic, no gallops, rubs, positive systolic murmur at the apex. No lower extremity edema. Pulmonary: positive breath sounds bilaterally, , no wheezing,or  rhonchi, positive rales. Gastrointestinal. Abdomen soft and non tender Skin. No rashes Musculoskeletal: no joint deformities     Data Reviewed: I have personally reviewed following labs and imaging studies  CBC: Recent Labs  Lab 04/23/21 2259 04/24/21 1241 04/25/21 0339  WBC 7.9  --  5.6  NEUTROABS  --   --  4.8  HGB 8.6* 9.9* 7.9*  HCT 28.6* 29.0* 26.2*  MCV 105.5*  --  102.7*  PLT 131*  --  123456*   Basic Metabolic Panel: Recent Labs  Lab 04/23/21 2259 04/24/21 1223 04/24/21 1241 04/25/21 0339 04/25/21 1154  NA 137  --  137 141 140  K 6.0* 5.7* 5.6* 6.0* 5.0  CL 89*  --   --  90* 88*  CO2 37*  --   --  42* 42*  GLUCOSE 384*  --   --  176* 290*  BUN 73*  --   --  81* 81*  CREATININE 3.65*  --   --  3.72* 3.69*  CALCIUM 9.2  --   --  9.3 8.8*   GFR: Estimated Creatinine Clearance: 12.7 mL/min (A) (by C-G formula based on SCr of 3.69 mg/dL (H)). Liver Function Tests: No results for input(s): AST, ALT, ALKPHOS, BILITOT, PROT, ALBUMIN in the last 168 hours. No results for input(s): LIPASE, AMYLASE in the last 168 hours. No results for input(s): AMMONIA in the last 168 hours. Coagulation Profile: No results for input(s): INR, PROTIME in the last 168 hours. Cardiac Enzymes: No results for input(s): CKTOTAL, CKMB, CKMBINDEX, TROPONINI in the last 168 hours. BNP (last 3 results) Recent Labs    12/06/20 0927  PROBNP >5000.0*   HbA1C: No results for input(s): HGBA1C in the last 72 hours. CBG: Recent  Labs  Lab 04/24/21 1514 04/24/21 2059 04/25/21 0602 04/25/21 1200 04/25/21 1520  GLUCAP 191* 82 134* 285* 260*   Lipid Profile: No results for input(s): CHOL, HDL, LDLCALC, TRIG, CHOLHDL, LDLDIRECT in the last 72 hours. Thyroid Function Tests: Recent Labs    04/24/21 1223  TSH 1.901   Anemia Panel: Recent Labs    04/25/21 0339  VITAMINB12 424  FOLATE 12.6      Radiology Studies: I have reviewed all of the imaging during this hospital visit personally     Scheduled Meds:  darbepoetin (ARANESP) injection - NON-DIALYSIS  60 mcg Subcutaneous Q Thu-1800   heparin  5,000 Units Subcutaneous Q8H   insulin aspart  0-6 Units Subcutaneous TID WC   insulin glargine  5 Units  Subcutaneous Daily   isosorbide-hydrALAZINE  1 tablet Oral TID   sodium chloride flush  3 mL Intravenous Q12H   Continuous Infusions:  sodium chloride     furosemide 120 mg (04/25/21 1144)     LOS: 1 day        Rianna Lukes Gerome Apley, MD

## 2021-04-25 NOTE — Progress Notes (Signed)
PT Cancellation Note  Patient Details Name: Beverly Martin MRN: [id number] DOB: 05-Apr-1956   Cancelled Treatment:    Reason Eval/Treat Not Completed: Other (comment).  Pt is still eating lunch, retry as time and pt allow.   Ramond Dial 04/25/2021, 12:34 PM  Mee Hives, PT MS Acute Rehab Dept. Number: [place]

## 2021-04-25 NOTE — Progress Notes (Signed)
  Echocardiogram 2D Echocardiogram has been performed.  Fidel Levy 04/25/2021, 2:12 PM

## 2021-04-25 NOTE — Progress Notes (Signed)
Inpatient Diabetes Program Recommendations  AACE/ADA: New Consensus Statement on Inpatient Glycemic Control (2015)  Target Ranges:  Prepandial:   less than 140 mg/dL      Peak postprandial:   less than 180 mg/dL (1-2 hours)      Critically ill patients:  140 - 180 mg/dL   Lab Results  Component Value Date   GLUCAP 285 (H) 04/25/2021   HGBA1C 9.1 (H) 03/14/2021   Spoke with patient briefly regarding diabetes. Patient had difficulty with insulin pens and husband had been trying to inject, with little success. Patient needed review of insulin pen use. Reviewed survival skills, interventions, and when to call MD. Educated patient on insulin pen use at home. Reviewed contents of insulin flexpen starter kit. Reviewed all steps if insulin pen including attachment of needle, 2-unit air shot, dialing up dose, giving injection, removing needle, disposal of sharps, storage of unused insulin, disposal of insulin etc. Patient able to provide successful return demonstration. Also reviewed troubleshooting with insulin pen. MD to give patient Rxs for insulin pens and insulin pen needles.  Thanks, Bronson Curb, MSN, RNC-OB Diabetes Coordinator [phone] (8a-5p)

## 2021-04-25 NOTE — Progress Notes (Signed)
PT Cancellation Note  Patient Details Name: Beverly Martin MRN: [id number] DOB: 20-Apr-1956   Cancelled Treatment:    Reason Eval/Treat Not Completed: Other (comment).  Pt reports she has already done therapy work, and declines PT.  States she is limiting herself each day, cannot do more.   Ramond Dial 04/25/2021, 1:26 PM  Mee Hives, PT MS Acute Rehab Dept. Number: [place]

## 2021-04-26 LAB — UREA NITROGEN, URINE: Urea Nitrogen, Ur: 462 mg/dL

## 2021-04-26 LAB — RENAL FUNCTION PANEL
Albumin: 3.6 g/dL (ref 3.5–5.0)
Anion gap: 10 (ref 5–15)
BUN: 80 mg/dL — ABNORMAL HIGH (ref 8–23)
CO2: 41 mmol/L — ABNORMAL HIGH (ref 22–32)
Calcium: 9 mg/dL (ref 8.9–10.3)
Chloride: 90 mmol/L — ABNORMAL LOW (ref 98–111)
Creatinine, Ser: 3.49 mg/dL — ABNORMAL HIGH (ref 0.44–1.00)
GFR, Estimated: 14 mL/min — ABNORMAL LOW (ref >60)
Glucose, Bld: 106 mg/dL — ABNORMAL HIGH (ref 70–99)
Phosphorus: 4.5 mg/dL (ref 2.5–4.6)
Potassium: 4.5 mmol/L (ref 3.5–5.1)
Sodium: 141 mmol/L (ref 135–145)

## 2021-04-26 LAB — GLUCOSE, CAPILLARY
Glucose-Capillary: 112 mg/dL — ABNORMAL HIGH (ref 70–99)
Glucose-Capillary: 170 mg/dL — ABNORMAL HIGH (ref 70–99)
Glucose-Capillary: 173 mg/dL — ABNORMAL HIGH (ref 70–99)
Glucose-Capillary: 130 mg/dL — ABNORMAL HIGH (ref 70–99)

## 2021-04-26 MED ORDER — CEPHALEXIN 250 MG PO CAPS
250.0000 mg | ORAL_CAPSULE | Freq: Two times a day (BID) | ORAL | Status: DC
  Administered 2021-04-26 – 2021-04-27 (×3): 250 mg via ORAL
  Filled 2021-04-26 (×3): qty 1

## 2021-04-26 MED ORDER — ATORVASTATIN CALCIUM 40 MG PO TABS: 40.0000 mg | ORAL_TABLET | Freq: Every day | ORAL | Status: DC

## 2021-04-26 MED ORDER — METOLAZONE 5 MG PO TABS
5.0000 mg | ORAL_TABLET | Freq: Once | ORAL | Status: AC
  Administered 2021-04-26: 5 mg via ORAL
  Filled 2021-04-26: qty 1

## 2021-04-26 MED ORDER — ATORVASTATIN CALCIUM 40 MG PO TABS
40.0000 mg | ORAL_TABLET | Freq: Every day | ORAL | Status: DC
  Administered 2021-04-26 – 2021-04-30 (×5): 40 mg via ORAL
  Filled 2021-04-26 (×5): qty 1

## 2021-04-26 MED ORDER — FLUCONAZOLE 150 MG PO TABS
150.0000 mg | ORAL_TABLET | Freq: Once | ORAL | Status: AC
  Administered 2021-04-26: 150 mg via ORAL
  Filled 2021-04-26: qty 1

## 2021-04-26 MED ORDER — ASPIRIN EC 81 MG PO TBEC
81.0000 mg | DELAYED_RELEASE_TABLET | Freq: Every day | ORAL | Status: DC
  Administered 2021-04-26 – 2021-04-30 (×5): 81 mg via ORAL
  Filled 2021-04-26 (×5): qty 1

## 2021-04-26 NOTE — Evaluation (Signed)
Physical Therapy Evaluation Patient Details Name: Beverly Martin MRN: [id number] DOB: [birth year] Today's Date: 04/26/2021   History of Present Illness  65 y.o. female presents to [facility] ED on 7/12 with complaints of chest pain and something going on with her sugar. Of note, pt recently hospitalized from 6/2-6.3 for CHF with acute> chronic kidney disease. PMH: HTN, HLD, combined systolic and diastolic CHF last EF 0000000 in 12/2020, CAD s/p CABG, CKD stage IV, on nasal cannula oxygen of 2 L as needed, diabetes mellitus type 2, and anemia.  Clinical Impression  Pt presents with deficits in strength, activity tolerance, balance, and gait. Pt tolerates bed mobility and transfers without assistance, requiring cueing for hand placement and device management with transfers. Pt reports recent episodes of "convulsions" during mobility, requiring husband's assistance for safety. Pt tolerates ambulation of household distances with RW, with occasional collisions into obstacles in hallway with distractions. Pt will benefit from continued acute PT to improve independence and safety. SPT recommends HHPT to improve activity tolerance and aid in return to prior level.    Follow Up Recommendations Home health PT;Supervision for mobility/OOB    Equipment Recommendations  None recommended by PT    Recommendations for Other Services       Precautions / Restrictions Precautions Precautions: Fall Restrictions Weight Bearing Restrictions: No      Mobility  Bed Mobility Overal bed mobility:  (Pt received sitting up in recliner.)             General bed mobility comments: HOB elevated    Transfers Overall transfer level: Needs assistance Equipment used: Rolling walker (2 wheeled) Transfers: Sit to/from [facility] Sit to Stand: Supervision Stand pivot transfers: Min guard;Supervision       General transfer comment: cues for hand placement  Ambulation/Gait Ambulation/Gait assistance:  Min guard Gait Distance (Feet): 140 Feet Assistive device: Rolling walker (2 wheeled) Gait Pattern/deviations: Step-through pattern;Decreased stride length Gait velocity: reduced Gait velocity interpretation: 1.31 - 2.62 ft/sec, indicative of limited community ambulator General Gait Details: Pt requires cues for proximity to device during gait and reports recent occasions of convulsions during ambulation. min G for safety. When distracted pt collides into two obstacles in hallway.  Stairs            Wheelchair Mobility    Modified Rankin (Stroke Patients Only)       Balance Overall balance assessment: Needs assistance Sitting-balance support: Feet supported Sitting balance-Leahy Scale: Good     Standing balance support: Single extremity supported;During functional activity (Standing at sink/bathing standing.) Standing balance-Leahy Scale: Fair Standing balance comment:  (Pt was noted to keep 1 hand on sink during ADL's to assist with balance)                             Pertinent Vitals/Pain Pain Assessment: No/denies pain    Home Living Family/patient expects to be discharged to:: Private residence Living Arrangements: Spouse/significant other Available Help at Discharge: Family;Available PRN/intermittently Type of Home: House Home Access: Level entry     Home Layout: One level Home Equipment: Clinical cytogeneticist - 2 wheels;Transport chair      Prior Function Level of Independence: Needs assistance   Gait / Transfers Assistance Needed: Pt ambulates with RW at home when needed, but primarily ambulates without device in the home. Pt reports not leaving the home often and uses transport chair in community when she does.  ADL's / Homemaking Assistance Needed: Pt receives  assistance for dressing and bathing from sisters  Comments: Pt reports husband assists as needed but is available PRN     Hand Dominance        Extremity/Trunk Assessment   Upper  Extremity Assessment Upper Extremity Assessment: Overall WFL for tasks assessed;Generalized weakness    Lower Extremity Assessment Lower Extremity Assessment: Generalized weakness    Cervical / Trunk Assessment Cervical / Trunk Assessment: Normal  Communication   Communication: No difficulties  Cognition Arousal/Alertness: Awake/alert Behavior During Therapy: WFL for tasks assessed/performed Overall Cognitive Status: Within Functional Limits for tasks assessed                                        General Comments General comments (skin integrity, edema, etc.): Pt reports "When I'm at home, my hands and legs get shaky at times and I feel like I might fall" Pt relies on husband to assist with set-up for ADL's    Exercises     Assessment/Plan    PT Assessment Patient needs continued PT services  PT Problem List Decreased strength;Decreased activity tolerance;Decreased balance;Decreased mobility;Decreased knowledge of use of DME;Decreased safety awareness;Decreased knowledge of precautions       PT Treatment Interventions DME instruction;Gait training;Functional mobility training;Therapeutic activities;Therapeutic exercise;Balance training;Patient/family education    PT Goals (Current goals can be found in the Care Plan section)  Acute Rehab PT Goals Patient Stated Goal:  (Get stronger and go home) PT Goal Formulation: With patient Time For Goal Achievement: 05/05/2021 Potential to Achieve Goals: Good    Frequency Min 3X/week   Barriers to discharge        Co-evaluation               AM-PAC PT "6 Clicks" Mobility  Outcome Measure Help needed turning from your back to your side while in a flat bed without using bedrails?: None Help needed moving from lying on your back to sitting on the side of a flat bed without using bedrails?: None Help needed moving to and from a bed to a chair (including a wheelchair)?: A Little Help needed standing up from a  chair using your arms (e.g., wheelchair or bedside chair)?: A Little Help needed to walk in hospital room?: A Little Help needed climbing 3-5 steps with a railing? : A Lot 6 Click Score: 19    End of Session Equipment Utilized During Treatment: Gait belt Activity Tolerance: Patient limited by fatigue Patient left: in chair;with call bell/phone within reach;with chair alarm set Nurse Communication: Mobility status PT Visit Diagnosis: Unsteadiness on feet (R26.81);Other abnormalities of gait and mobility (R26.89);Muscle weakness (generalized) (M62.81)    Time: [id number] PT Time Calculation (min) (ACUTE ONLY): 21 min   Charges:   PT Evaluation $PT Eval Low Complexity: 1 Low          Acute Rehab  Pager: [phone]   Garwin Brothers, SPT  04/26/2021, 12:01 PM

## 2021-04-26 NOTE — Plan of Care (Signed)

## 2021-04-26 NOTE — Plan of Care (Signed)

## 2021-04-26 NOTE — Progress Notes (Signed)
PROGRESS NOTE    Beverly Martin  [id number] DOB: [birth year] DOA: 04/23/2021 PCP: Billie Ruddy, MD    Brief Narrative:  Beverly Martin was admitted to the hospital with the working diagnosis of acute decompensation of chronic systolic heart failure.   65 year old female past medical history for hypertension, dyslipidemia, heart failure, coronary artery disease status post bypass grafting, type 2 diabetes mellitus, anemia and chronic kidney disease stage IV.  She was asked to be brought to the hospital because concerns of her blood sugars.  Apparently patient had been noted to have lower extremity swelling and orthopnea.  On her initial physical examination blood pressure 139/73, heart rate 83, respiratory rate 16, oxygen saturation 100% on supplemental oxygen, her lungs had decreased breath sounds bilaterally with positive rales, heart S1-S2, present, rhythmic, abdomen soft nontender, positive lower extremity edema.   Sodium 137, potassium 6.0, chloride 89, bicarb 37, glucose 384, BUN 73, creatinine 3.65, BNP >4500, high sensitive troponin 29-58-222, white count 7.9, hemoglobin 8.6, hematocrit 28.6, platelets 131. SARS COVID-19 negative.   Urinalysis specific gravity 1.015, >50 white cells.   Chest radiograph with cardiomegaly, hilar vascular congestion, fluid in the right fissure, small pleural effusions bilaterally.   EKG 99 bpm, rightward axis, intervals, sinus rhythm, Q-wave V1-V2, ST depressions, T wave inversion on lead II, lead III, aVF, V5-V6  Patient placed on aggressive diuresis for volume overload.    Assessment & Plan:   Principal Problem:   Acute on chronic combined systolic (congestive) and diastolic (congestive) heart failure (HCC) Active Problems:   Essential hypertension   Type 2 diabetes mellitus with hyperlipidemia (HCC)   CAD (coronary artery disease)   Acute kidney injury superimposed on chronic kidney disease (HCC)   Elevated troponin   Hyperkalemia    Macrocytic anemia   Thrombocytopenia (HCC)    Acute on chronic systolic heart failure decompensation. Today her edema has improved. Documented urine out put over last 24 hrs is 1,900 ml.  Systolic blood pressure is 137 mmHg. Warm lower extremities and stable renal function.    Continue diuresis with IV furosemide. Heart failure management with Bidil. Holding on RAAS blockade due to low GFR.    2. AKI on Ckd stage IV, hyperkalemia. Metabolic alkalosis Stable renal function with serum cr at 3,49 with AK at 4,5 and serum bicarbonate at 41   Continue diuresis with furosemide, plus one dose of metolazone per nephrology recommendations. Follow up on renal function in am.    3. T2DM. Fasting glucose is 106 mg/dl. On insulin sliding scale for glucose cover and monitoring. Continue with basal insulin with 5 units glargine.   4. Chronic anemia and thrombocytopenia. Follow up Hgb is 7,9 with Hct at 26,2 Plt 113. Continue close follow up of cell count, no current indication for transfusion.   5. UTI. Positive UA but no dysuria, plan for short course of antibiotic therapy with cephalexin Added one dose for vaginal yeast   Patient continue to be at high risk for worsening heart failure.   Status is: Inpatient  Remains inpatient appropriate because:Inpatient level of care appropriate due to severity of illness  Dispo: The patient is from: Home              Anticipated d/c is to: Home              Patient currently is not medically stable to d/c.   Difficult to place patient No   DVT prophylaxis: Heparin   Code Status:   full  Family Communication:  No family at the bedside      Consultants:  Cardiology     Subjective: Patient is feeling better, dyspnea and lower extremity edema continue to improve, today she ambulated in the hallway with PT   Objective: Vitals:   04/25/21 1930 04/26/21 0035 04/26/21 0331 04/26/21 0500  BP: 135/70  137/71   Pulse: 87  90   Resp: 19  19    Temp: 98 F (36.7 C)  98.5 F (36.9 C)   TempSrc: Oral  Oral   SpO2: 100%  98%   Weight:  53 kg  53 kg  Height:        Intake/Output Summary (Last 24 hours) at 04/26/2021 1449 Last data filed at 04/26/2021 1321 Gross per 24 hour  Intake 836.6 ml  Output 1900 ml  Net -1063.4 ml   Filed Weights   04/25/21 0446 04/26/21 0035 04/26/21 0500  Weight: 53.6 kg 53 kg 53 kg    Examination:   General: Not in pain or dyspnea, deconditioned  Neurology: Awake and alert, non focal  E ENT: no pallor, no icterus, oral mucosa moist Cardiovascular: No JVD. S1-S2 present, rhythmic, no gallops, rubs, or murmurs.Trace` lower extremity edema. Pulmonary: positive breath sounds bilaterally, no wheezing, or rhonchi mild rales at bases. Gastrointestinal. Abdomen soft and non tender Skin. No rashes Musculoskeletal: no joint deformities     Data Reviewed: I have personally reviewed following labs and imaging studies  CBC: Recent Labs  Lab 04/23/21 2259 04/24/21 1241 04/25/21 0339  WBC 7.9  --  5.6  NEUTROABS  --   --  4.8  HGB 8.6* 9.9* 7.9*  HCT 28.6* 29.0* 26.2*  MCV 105.5*  --  102.7*  PLT 131*  --  123456*   Basic Metabolic Panel: Recent Labs  Lab 04/23/21 2259 04/24/21 1223 04/24/21 1241 04/25/21 0339 04/25/21 1154 04/26/21 0346  NA 137  --  137 141 140 141  K 6.0* 5.7* 5.6* 6.0* 5.0 4.5  CL 89*  --   --  90* 88* 90*  CO2 37*  --   --  42* 42* 41*  GLUCOSE 384*  --   --  176* 290* 106*  BUN 73*  --   --  81* 81* 80*  CREATININE 3.65*  --   --  3.72* 3.69* 3.49*  CALCIUM 9.2  --   --  9.3 8.8* 9.0  PHOS  --   --   --   --   --  4.5   GFR: Estimated Creatinine Clearance: 13.3 mL/min (A) (by C-G formula based on SCr of 3.49 mg/dL (H)). Liver Function Tests: Recent Labs  Lab 04/26/21 0346  ALBUMIN 3.6   No results for input(s): LIPASE, AMYLASE in the last 168 hours. No results for input(s): AMMONIA in the last 168 hours. Coagulation Profile: No results for input(s):  INR, PROTIME in the last 168 hours. Cardiac Enzymes: No results for input(s): CKTOTAL, CKMB, CKMBINDEX, TROPONINI in the last 168 hours. BNP (last 3 results) Recent Labs    12/06/20 0927  PROBNP >5000.0*   HbA1C: No results for input(s): HGBA1C in the last 72 hours. CBG: Recent Labs  Lab 04/25/21 1200 04/25/21 1520 04/25/21 2059 04/26/21 0559 04/26/21 1139  GLUCAP 285* 260* 103* 112* 170*   Lipid Profile: No results for input(s): CHOL, HDL, LDLCALC, TRIG, CHOLHDL, LDLDIRECT in the last 72 hours. Thyroid Function Tests: Recent Labs    04/24/21 1223  TSH 1.901   Anemia Panel: Recent  Labs    04/25/21 0339  VITAMINB12 424  FOLATE 12.6      Radiology Studies: I have reviewed all of the imaging during this hospital visit personally     Scheduled Meds:  aspirin EC  81 mg Oral Daily   atorvastatin  40 mg Oral Daily   cephALEXin  250 mg Oral Q12H   darbepoetin (ARANESP) injection - NON-DIALYSIS  60 mcg Subcutaneous Q Thu-1800   heparin  5,000 Units Subcutaneous Q8H   insulin aspart  0-6 Units Subcutaneous TID WC   insulin glargine  5 Units Subcutaneous Daily   isosorbide-hydrALAZINE  1 tablet Oral TID   sodium chloride flush  3 mL Intravenous Q12H   Continuous Infusions:  sodium chloride     furosemide 120 mg (04/26/21 1009)     LOS: 2 days        Becky Berberian Gerome Apley, MD

## 2021-04-26 NOTE — Therapy (Signed)
Occupational Therapy Treatment Patient Details Name: Beverly Martin MRN: [id number] DOB: 1956-06-08 Today's Date: 04/26/2021    History of present illness 65 y.o. female presents to [facility] ED on 7/12 with complaints of chest pain and something going on with her sugar. Of note, pt recently hospitalized from 6/2-6.3 for CHF with acute> chronic kidney disease. PMH: HTN, HLD, combined systolic and diastolic CHF last EF 0000000 in 12/2020, CAD s/p CABG, CKD stage IV, on nasal cannula oxygen of 2 L as needed, diabetes mellitus type 2, and anemia.   OT comments  Pt seen for OT ADL retraining session today with focus on functional mobility/transfers, activity tolerance and increasing endurance  while bath/dressing standing at sink, toileting etc. Pt tolerated treatment well today requiring less physical assistance during ADL's and transfers. Cont. towards plan of goals for acute OT.   Follow Up Recommendations  Home health OT;Supervision/Assistance - 24 hour    Equipment Recommendations  Toilet riser    Recommendations for Other Services      Precautions / Restrictions Precautions Precautions: Fall Restrictions Weight Bearing Restrictions: No       Mobility Bed Mobility Overal bed mobility:  (Pt received sitting up in recliner.)             General bed mobility comments: HOB elevated    Transfers Overall transfer level: Needs assistance Equipment used: Rolling walker (2 wheeled) Transfers: Sit to/from [facility] Sit to Stand: Supervision Stand pivot transfers: Min guard;Supervision       General transfer comment: cues for hand placement    Balance Overall balance assessment: Needs assistance Sitting-balance support: Feet supported Sitting balance-Leahy Scale: Good     Standing balance support: Single extremity supported;During functional activity (Standing at sink/bathing standing.) Standing balance-Leahy Scale: Fair Standing balance comment:  (Pt was  noted to keep 1 hand on sink during ADL's to assist with balance)                           ADL either performed or assessed with clinical judgement   ADL Overall ADL's : Needs assistance/impaired     Grooming: Wash/dry hands;Wash/dry face;Oral care;Applying deodorant;Supervision/safety;Standing (Pt stood at sink today for grooming/ADL's w/ focus on increasing activity tolerance and endurance.)   Upper Body Bathing: Set up;Supervision/ safety;Standing (VC's for energy conservation techniques including sitting for tasks PRN.)   Lower Body Bathing: Set up;Supervison/ safety;Sitting/lateral leans;Sit to/from stand (Pt sitting and sit to stand from 3:1 at sink overall close supervision and increased time for LB bathe and dressing noted.)   Upper Body Dressing : Set up;Sitting   Lower Body Dressing: Min guard;Set up;Cueing for safety;Sit to/from stand (Pt sonned and doffed/changed underwear and removed pJ bottoms with Min guard assist after bathing today. VC for EC tech. Rest break x1 in sitting on 3:1 at sink.)   Toilet Transfer: Min guard;Supervision/safety;Ambulation;BSC;RW;Cueing for sequencing (Pt ambulated from recliner to 3:1 at sink w/ overall min guard-close supervision.)   Toileting- Clothing Manipulation and Hygiene: Supervision/safety;Sitting/lateral lean;Sit to/from stand       Functional mobility during ADLs: Min guard;Supervision/safety;Cueing for safety;Cueing for sequencing;Rolling walker General ADL Comments: Pt was seen for OT ADL retraining session today w/ focus on endurance/activity tolerance during bathe/dressing standing at sink. Pt demonstrated improved endurance this date.     Cognition Arousal/Alertness: Awake/alert Behavior During Therapy: WFL for tasks assessed/performed Overall Cognitive Status: Within Functional Limits for tasks assessed  General Comments Pt reports "When I'm at  home, my hands and legs get shaky at times and I feel like I might fall" Pt relies on husband to assist with set-up for ADL's    Pertinent Vitals/ Pain       Pain Assessment: No/denies pain  Home Living Family/patient expects to be discharged to:: Private residence Living Arrangements: Spouse/significant other Available Help at Discharge: Family;Available PRN/intermittently Type of Home: House Home Access: Level entry     Home Layout: One level     Bathroom Shower/Tub: Walk-in shower;Tub/shower unit   Bathroom Toilet: Handicapped height     Home Equipment: Clinical cytogeneticist - 2 wheels;Transport chair          Prior Functioning/Environment Level of Independence: Needs assistance  Gait / Transfers Assistance Needed: Pt ambulates with RW at home when needed, but primarily ambulates without device in the home. Pt reports not leaving the home often and uses transport chair in community when she does. ADL's / Homemaking Assistance Needed: Pt receives assistance for dressing and bathing from sisters   Comments: Pt reports husband assists as needed but is available PRN   Frequency  Min 2X/week        Progress Toward Goals  OT Goals(current goals can now be found in the care plan section)  Progress towards OT goals: Progressing toward goals  Acute Rehab OT Goals Patient Stated Goal:  (Get stronger and go home)  Plan Discharge plan remains appropriate;Frequency remains appropriate                  AM-PAC OT "6 Clicks" Daily Activity     Outcome Measure   Help from another person eating meals?: None Help from another person taking care of personal grooming?: A Little Help from another person toileting, which includes using toliet, bedpan, or urinal?: A Little Help from another person bathing (including washing, rinsing, drying)?: A Lot Help from another person to put on and taking off regular upper body clothing?: A Little Help from another person to put on and  taking off regular lower body clothing?: A Little 6 Click Score: 18    End of Session Equipment Utilized During Treatment: Gait belt;Rolling walker;Oxygen  OT Visit Diagnosis: Unsteadiness on feet (R26.81);Other abnormalities of gait and mobility (R26.89);Muscle weakness (generalized) (M62.81)   Activity Tolerance Patient tolerated treatment well   Patient Left in chair;with call bell/phone within reach;with chair alarm set   Nurse Communication Mobility status;Other (comment) (IV appeared to be leaking, RN assessed in room)        Time: [id number] OT Time Calculation (min): 43 min  Charges: OT General Charges $OT Visit: 1 Visit OT Treatments $Self Care/Home Management : 23-37 mins $Therapeutic Activity: 8-22 mins    Hayward Rylander Beth Dixon, OTR/L 04/26/2021, 11:46 AM

## 2021-04-26 NOTE — Progress Notes (Signed)
[facility] Resident Progress Note   Subjective:   HD 2 Beverly Martin is sitting up in bed, no acute distress. She believes she has some irritation with urinating due to a yeast infection. Reports feeling weak this morning.  Breathing improved and no chest pain at this time.    Objective Vitals:   04/25/21 1930 04/26/21 0035 04/26/21 0331 04/26/21 0500  BP: 135/70  137/71   Pulse: 87  90   Resp: 19  19   Temp: 98 F (36.7 C)  98.5 F (36.9 C)   TempSrc: Oral  Oral   SpO2: 100%  98%   Weight:  53 kg  53 kg  Height:       Physical Exam General: chronically ill appearing elderly thin female, no acute distress Heart: RRR, S1 and S2 present, no mr/g Lungs: persistent bibasilar crackles, on 2L O2 Abdomen: nondistended, soft, nontender, +BS Extremities:no edema noted; distal pulses present  Dialysis Access: None   Additional Objective Labs: Basic Metabolic Panel: Recent Labs  Lab 04/25/21 0339 04/25/21 1154 04/26/21 0346  NA 141 140 141  K 6.0* 5.0 4.5  CL 90* 88* 90*  CO2 42* 42* 41*  GLUCOSE 176* 290* 106*  BUN 81* 81* 80*  CREATININE 3.72* 3.69* 3.49*  CALCIUM 9.3 8.8* 9.0  PHOS  --   --  4.5   CBC: Recent Labs  Lab 04/23/21 2259 04/24/21 1241 04/25/21 0339  WBC 7.9  --  5.6  NEUTROABS  --   --  4.8  HGB 8.6* 9.9* 7.9*  HCT 28.6* 29.0* 26.2*  MCV 105.5*  --  102.7*  PLT 131*  --  113*   Blood Culture    Component Value Date/Time   SDES BLOOD LEFT ANTECUBITAL 10/20/2017 2003   SPECREQUEST  10/20/2017 2003    BOTTLES DRAWN AEROBIC AND ANAEROBIC Blood Culture adequate volume   CULT (A) 10/20/2017 2003    KLEBSIELLA PNEUMONIAE SUSCEPTIBILITIES PERFORMED ON PREVIOUS CULTURE WITHIN THE LAST 5 DAYS.    REPTSTATUS 10/23/2017 FINAL 10/20/2017 2003    Cardiac Enzymes: No results for input(s): CKTOTAL, CKMB, CKMBINDEX, TROPONINI in the last 168 hours. CBG: Recent Labs  Lab 04/25/21 0602 04/25/21 1200 04/25/21 1520 04/25/21 2059  04/26/21 0559  GLUCAP 134* 285* 260* 103* 112*   Studies/Results: US RENAL  Result Date: 04/24/2021 CLINICAL DATA:  65 year old female with acute renal insufficiency. EXAM: RENAL / URINARY TRACT ULTRASOUND COMPLETE COMPARISON:  None. FINDINGS: Right Kidney: Renal measurements: 8.8 x 3.7 x 3.8 cm = volume: 64 mL. There is diffuse increased renal parenchymal echogenicity. No hydronephrosis or shadowing stone. Left Kidney: Renal measurements: 9.3 x 4.6 x 3.6 cm = volume: 81 mL. Diffuse increased renal parenchymal echogenicity. No hydronephrosis or shadowing stone. Bladder: Appears normal for degree of bladder distention. Other: Small ascites. IMPRESSION: 1. Echogenic kidneys may represent medical renal disease. Clinical correlation is recommended. No hydronephrosis or shadowing stone. 2. Small ascites. Electronically Signed   By: Anner Crete M.D.   On: 04/24/2021 20:41   ECHOCARDIOGRAM COMPLETE  Result Date: 04/25/2021    ECHOCARDIOGRAM REPORT   Patient Name:   Beverly Martin Date of Exam: 04/25/2021 Medical Rec #:  [id number]      Height:       64.0 in Accession #:    [id number]     Weight:       118.1 lb Date of Birth:  [birth year]      BSA:  1.564 m Patient Age:    21 years       BP:           140/73 mmHg Patient Gender: F              HR:           98 bpm. Exam Location:  Inpatient Procedure: 2D Echo, Cardiac Doppler and Color Doppler Indications:    CHF-Acute Systolic [id number]  History:        Patient has prior history of Echocardiogram examinations, most                 recent 01/03/2021. CHF, CAD; Risk Factors:Diabetes, Hypertension                 and Dyslipidemia.  Sonographer:    Bernadene Person RDCS Referring Phys: [id number] [place]  1. Left ventricular ejection fraction, by estimation, is 30 to 35%. The left ventricle has moderately decreased function. The left ventricle demonstrates global hypokinesis. There is mild left ventricular hypertrophy. Left ventricular  diastolic parameters are consistent with Grade II diastolic dysfunction (pseudonormalization).  2. Right ventricular systolic function is moderately reduced. The right ventricular size is mildly enlarged. There is moderately elevated pulmonary artery systolic pressure. The estimated right ventricular systolic pressure is 0000000 mmHg.  3. Left atrial size was moderately dilated.  4. The mitral valve is normal in structure. Trivial mitral valve regurgitation. No evidence of mitral stenosis.  5. The aortic valve is tricuspid. Aortic valve regurgitation is not visualized. No aortic stenosis is present.  6. The inferior vena cava is normal in size with <50% respiratory variability, suggesting right atrial pressure of 8 mmHg. FINDINGS  Left Ventricle: Left ventricular ejection fraction, by estimation, is 30 to 35%. The left ventricle has moderately decreased function. The left ventricle demonstrates global hypokinesis. The left ventricular internal cavity size was normal in size. There is mild left ventricular hypertrophy. Left ventricular diastolic parameters are consistent with Grade II diastolic dysfunction (pseudonormalization). Right Ventricle: The right ventricular size is mildly enlarged. No increase in right ventricular wall thickness. Right ventricular systolic function is moderately reduced. There is moderately elevated pulmonary artery systolic pressure. The tricuspid regurgitant velocity is 3.39 m/s, and with an assumed right atrial pressure of 8 mmHg, the estimated right ventricular systolic pressure is 0000000 mmHg. Left Atrium: Left atrial size was moderately dilated. Right Atrium: Right atrial size was normal in size. Pericardium: There is no evidence of pericardial effusion. Mitral Valve: The mitral valve is normal in structure. Trivial mitral valve regurgitation. No evidence of mitral valve stenosis. Tricuspid Valve: The tricuspid valve is normal in structure. Tricuspid valve regurgitation is mild. Aortic  Valve: The aortic valve is tricuspid. Aortic valve regurgitation is not visualized. No aortic stenosis is present. Pulmonic Valve: The pulmonic valve was normal in structure. Pulmonic valve regurgitation is mild. Aorta: The aortic root is normal in size and structure. Venous: The inferior vena cava is normal in size with less than 50% respiratory variability, suggesting right atrial pressure of 8 mmHg. IAS/Shunts: No atrial level shunt detected by color flow Doppler.  LEFT VENTRICLE PLAX 2D LVIDd:         4.80 cm      Diastology LVIDs:         3.90 cm      LV e' medial:    3.26 cm/s LV PW:         1.30 cm      LV  E/e' medial:  31.0 LV IVS:        0.90 cm      LV e' lateral:   4.39 cm/s LVOT diam:     1.70 cm      LV E/e' lateral: 23.0 LV SV:         43 LV SV Index:   27 LVOT Area:     2.27 cm  LV Volumes (MOD) LV vol d, MOD A2C: 106.0 ml LV vol d, MOD A4C: 97.9 ml LV vol s, MOD A2C: 69.7 ml LV vol s, MOD A4C: 67.4 ml LV SV MOD A2C:     36.3 ml LV SV MOD A4C:     97.9 ml LV SV MOD BP:      35.7 ml RIGHT VENTRICLE RV S prime:     6.47 cm/s TAPSE (M-mode): 1.2 cm LEFT ATRIUM             Index       RIGHT ATRIUM           Index LA diam:        3.70 cm 2.37 cm/m  RA Area:     12.80 cm LA Vol (A2C):   68.1 ml 43.54 ml/m RA Volume:   30.00 ml  19.18 ml/m LA Vol (A4C):   68.4 ml 43.74 ml/m LA Biplane Vol: 69.9 ml 44.69 ml/m  AORTIC VALVE LVOT Vmax:   96.80 cm/s LVOT Vmean:  70.800 cm/s LVOT VTI:    0.188 m  AORTA Ao Root diam: 2.60 cm Ao Asc diam:  2.80 cm MITRAL VALVE                TRICUSPID VALVE MV Area (PHT): 3.99 cm     TR Peak grad:   46.0 mmHg MV Decel Time: 190 msec     TR Vmax:        339.00 cm/s MV E velocity: 101.00 cm/s MV A velocity: 92.60 cm/s   SHUNTS MV E/A ratio:  1.09         Systemic VTI:  0.19 m                             Systemic Diam: 1.70 cm Loralie Champagne MD Electronically signed by Loralie Champagne MD Signature Date/Time: 04/25/2021/5:10:32 PM    Final    Medications:  sodium chloride      furosemide 120 mg (04/25/21 2019)    darbepoetin (ARANESP) injection - NON-DIALYSIS  60 mcg Subcutaneous Q Thu-1800   heparin  5,000 Units Subcutaneous Q8H   insulin aspart  0-6 Units Subcutaneous TID WC   insulin glargine  5 Units Subcutaneous Daily   isosorbide-hydrALAZINE  1 tablet Oral TID   sodium chloride flush  3 mL Intravenous Q12H   Assessment/Plan: 1.Acute on chronic combined systolic and diastolic heart failure: suspected to be mixed ischemic and NICM in setting of medication nonadherence. BNP>4500. 1.9L UOP over past 24hrs with IV Lasix '120mg'$  bid. Continues to be hypervolemic on exam. Has historically needed inotropic support to augment diuresis. Added dose of metolazone this morning for continued diuresis. Cont diuresis and BiDil per HF team.  AKI ON CKD IV: Baseline sCr 2-3; on admission 3.65>3.72>3.49 this AM; GFR 13>14. Likely pre-renal in setting of CHF exacerbation as above. Previously thought to be multifactorial in setting of longstanding poorly controlled diabetes mellitus and hypertension. Renal US with evidence of chronic renal disease without hydronephrosis or shadowing stone.Will continue  to monitor renal function. UA with bacteruria and leukocytes. Also noted to have some proteinuria, glucosuria and trace hemoglobinuria. FeNa 3.5% although did receive Lasix dosing prior to this. Will f/u urea. Closely monitor BP on BiDil, would like to prevent hypotension to avoid ATN. No indication for CRRT at this time. Continue to hold all nephrotoxic medications and renally dose meds. Hyperkalemia: Improved to 4.5 this AM with IV Lasix and Lokelma.  Metabolic alkalosis: This seems to be chronic. HCO3 37>42>41 in setting of diuresis.  Not a good candidate for diamox due to compromised renal function. She does not need urgent CRRT at this time. Will continue to trend. Macrocytic anemia w/hx of normocytic anemia: prior iron studies wnl. Vitamin B12 and folate wnl. Aranesp weekly.  Type II  DM: insulin dosing per primary team   Harvie Heck, MD Internal Medicine, PGY-3 04/26/21 8:12 AM Pager # [phone]

## 2021-04-26 NOTE — Progress Notes (Addendum)
Advanced Heart Failure Team Rounding Note   Primary Physician:Dr. Volanda Napoleon Primary Cardiologist:  Dr. Haroldine Laws  Reason for Consultation: Acute on chronic Combined Systolic and Diastolic CHF  HPI:    0000000 output yesterday, Cr 3.65>3.72>3.49.  Wt down about one pound.  Denies shortness of breath, chest pain. ECHO yesterday with EF improvement from 20-25% to 30-35%, G2DD, Moderately reduced RV function, IVC 1.8cm with <50% respiratory variability.     Objective:    Vital Signs:   Temp:  [97.5 F (36.4 C)-98.5 F (36.9 C)] 98.5 F (36.9 C) (07/15 0331) Pulse Rate:  [83-93] 90 (07/15 0331) Resp:  [18-19] 19 (07/15 0331) BP: (135-142)/(69-71) 137/71 (07/15 0331) SpO2:  [98 %-100 %] 98 % (07/15 0331) Weight:  [53 kg] 53 kg (07/15 0500) Last BM Date: 04/23/21  Weight change: Filed Weights   04/25/21 0446 04/26/21 0035 04/26/21 0500  Weight: 53.6 kg 53 kg 53 kg    Intake/Output:   Intake/Output Summary (Last 24 hours) at 04/26/2021 0755 Last data filed at 04/26/2021 0333 Gross per 24 hour  Intake 1266.6 ml  Output 1900 ml  Net -633.4 ml      Physical Exam    General:  Well appearing. No resp difficulty HEENT: normal Neck: supple. Carotids 2+ bilat; no bruits. No lymphadenopathy or thyromegaly appreciated. Cor: PMI nondisplaced. Regular rate & rhythm. No rubs, gallops or murmurs. Lungs: bibasilar rales, shallow respirations Abdomen: soft, nontender, nondistended. No hepatosplenomegaly. No bruits or masses. Good bowel sounds. Extremities: no cyanosis, clubbing, rash, no LE edema bilaterally Neuro: alert/orientedx3, cranial nerves grossly intact. moves all 4 extremities w/o difficulty. Affect pleasant  Telemetry   Nsr rate 80's-90s  EKG    No new ecg  Labs   Basic Metabolic Panel: Recent Labs  Lab 04/23/21 2259 04/24/21 1223 04/24/21 1241 04/25/21 0339 04/25/21 1154 04/26/21 0346  NA 137  --  137 141 140 141  K 6.0* 5.7* 5.6* 6.0* 5.0 4.5  CL 89*  --    --  90* 88* 90*  CO2 37*  --   --  42* 42* 41*  GLUCOSE 384*  --   --  176* 290* 106*  BUN 73*  --   --  81* 81* 80*  CREATININE 3.65*  --   --  3.72* 3.69* 3.49*  CALCIUM 9.2  --   --  9.3 8.8* 9.0  PHOS  --   --   --   --   --  4.5    Liver Function Tests: Recent Labs  Lab 04/26/21 0346  ALBUMIN 3.6   No results for input(s): LIPASE, AMYLASE in the last 168 hours. No results for input(s): AMMONIA in the last 168 hours.  CBC: Recent Labs  Lab 04/23/21 2259 04/24/21 1241 04/25/21 0339  WBC 7.9  --  5.6  NEUTROABS  --   --  4.8  HGB 8.6* 9.9* 7.9*  HCT 28.6* 29.0* 26.2*  MCV 105.5*  --  102.7*  PLT 131*  --  113*    Cardiac Enzymes: No results for input(s): CKTOTAL, CKMB, CKMBINDEX, TROPONINI in the last 168 hours.  BNP: BNP (last 3 results) Recent Labs    03/14/21 0934 04/10/21 1129 04/24/21 1223  BNP >4,500.0* >4,500.0* >4,500.0*    ProBNP (last 3 results) Recent Labs    12/06/20 0927  PROBNP >5000.0*     CBG: Recent Labs  Lab 04/25/21 0602 04/25/21 1200 04/25/21 1520 04/25/21 2059 04/26/21 0559  GLUCAP 134* 285* 260* 103*  112*    Coagulation Studies: No results for input(s): LABPROT, INR in the last 72 hours.   Imaging   ECHOCARDIOGRAM COMPLETE  Result Date: 04/25/2021    ECHOCARDIOGRAM REPORT   Patient Name:   Beverly Martin Date of Exam: 04/25/2021 Medical Rec #:  [id number]      Height:       64.0 in Accession #:    [id number]     Weight:       118.1 lb Date of Birth:  December 31, 1955      BSA:          1.564 m Patient Age:    65 years       BP:           140/73 mmHg Patient Gender: F              HR:           98 bpm. Exam Location:  Inpatient Procedure: 2D Echo, Cardiac Doppler and Color Doppler Indications:    CHF-Acute Systolic [id number]  History:        Patient has prior history of Echocardiogram examinations, most                 recent 01/03/2021. CHF, CAD; Risk Factors:Diabetes, Hypertension                 and Dyslipidemia.  Sonographer:     Bernadene Person RDCS Referring Phys: [id number] [place]  1. Left ventricular ejection fraction, by estimation, is 30 to 35%. The left ventricle has moderately decreased function. The left ventricle demonstrates global hypokinesis. There is mild left ventricular hypertrophy. Left ventricular diastolic parameters are consistent with Grade II diastolic dysfunction (pseudonormalization).  2. Right ventricular systolic function is moderately reduced. The right ventricular size is mildly enlarged. There is moderately elevated pulmonary artery systolic pressure. The estimated right ventricular systolic pressure is 0000000 mmHg.  3. Left atrial size was moderately dilated.  4. The mitral valve is normal in structure. Trivial mitral valve regurgitation. No evidence of mitral stenosis.  5. The aortic valve is tricuspid. Aortic valve regurgitation is not visualized. No aortic stenosis is present.  6. The inferior vena cava is normal in size with <50% respiratory variability, suggesting right atrial pressure of 8 mmHg. FINDINGS  Left Ventricle: Left ventricular ejection fraction, by estimation, is 30 to 35%. The left ventricle has moderately decreased function. The left ventricle demonstrates global hypokinesis. The left ventricular internal cavity size was normal in size. There is mild left ventricular hypertrophy. Left ventricular diastolic parameters are consistent with Grade II diastolic dysfunction (pseudonormalization). Right Ventricle: The right ventricular size is mildly enlarged. No increase in right ventricular wall thickness. Right ventricular systolic function is moderately reduced. There is moderately elevated pulmonary artery systolic pressure. The tricuspid regurgitant velocity is 3.39 m/s, and with an assumed right atrial pressure of 8 mmHg, the estimated right ventricular systolic pressure is 0000000 mmHg. Left Atrium: Left atrial size was moderately dilated. Right Atrium: Right atrial size was  normal in size. Pericardium: There is no evidence of pericardial effusion. Mitral Valve: The mitral valve is normal in structure. Trivial mitral valve regurgitation. No evidence of mitral valve stenosis. Tricuspid Valve: The tricuspid valve is normal in structure. Tricuspid valve regurgitation is mild. Aortic Valve: The aortic valve is tricuspid. Aortic valve regurgitation is not visualized. No aortic stenosis is present. Pulmonic Valve: The pulmonic valve was normal in structure. Pulmonic valve regurgitation is mild. Aorta: The  aortic root is normal in size and structure. Venous: The inferior vena cava is normal in size with less than 50% respiratory variability, suggesting right atrial pressure of 8 mmHg. IAS/Shunts: No atrial level shunt detected by color flow Doppler.  LEFT VENTRICLE PLAX 2D LVIDd:         4.80 cm      Diastology LVIDs:         3.90 cm      LV e' medial:    3.26 cm/s LV PW:         1.30 cm      LV E/e' medial:  31.0 LV IVS:        0.90 cm      LV e' lateral:   4.39 cm/s LVOT diam:     1.70 cm      LV E/e' lateral: 23.0 LV SV:         43 LV SV Index:   27 LVOT Area:     2.27 cm  LV Volumes (MOD) LV vol d, MOD A2C: 106.0 ml LV vol d, MOD A4C: 97.9 ml LV vol s, MOD A2C: 69.7 ml LV vol s, MOD A4C: 67.4 ml LV SV MOD A2C:     36.3 ml LV SV MOD A4C:     97.9 ml LV SV MOD BP:      35.7 ml RIGHT VENTRICLE RV S prime:     6.47 cm/s TAPSE (M-mode): 1.2 cm LEFT ATRIUM             Index       RIGHT ATRIUM           Index LA diam:        3.70 cm 2.37 cm/m  RA Area:     12.80 cm LA Vol (A2C):   68.1 ml 43.54 ml/m RA Volume:   30.00 ml  19.18 ml/m LA Vol (A4C):   68.4 ml 43.74 ml/m LA Biplane Vol: 69.9 ml 44.69 ml/m  AORTIC VALVE LVOT Vmax:   96.80 cm/s LVOT Vmean:  70.800 cm/s LVOT VTI:    0.188 m  AORTA Ao Root diam: 2.60 cm Ao Asc diam:  2.80 cm MITRAL VALVE                TRICUSPID VALVE MV Area (PHT): 3.99 cm     TR Peak grad:   46.0 mmHg MV Decel Time: 190 msec     TR Vmax:        339.00 cm/s MV E  velocity: 101.00 cm/s MV A velocity: 92.60 cm/s   SHUNTS MV E/A ratio:  1.09         Systemic VTI:  0.19 m                             Systemic Diam: 1.70 cm Loralie Champagne MD Electronically signed by Loralie Champagne MD Signature Date/Time: 04/25/2021/5:10:32 PM    Final      Medications:     Current Medications:  darbepoetin (ARANESP) injection - NON-DIALYSIS  60 mcg Subcutaneous Q Thu-1800   heparin  5,000 Units Subcutaneous Q8H   insulin aspart  0-6 Units Subcutaneous TID WC   insulin glargine  5 Units Subcutaneous Daily   isosorbide-hydrALAZINE  1 tablet Oral TID   sodium chloride flush  3 mL Intravenous Q12H    Infusions:  sodium chloride     furosemide 120 mg (04/25/21 2019)    Assessment/Plan  1. Acute on Chronic Combined Systolic and Diastolic CHF: - Likely mixed ischemic and NICM, poorly controlled HTN, poor medication adherence - Echo 12/2020 EF down from 45-50% in 2019 to 20-25%, RV moderately reduced - 04/25/21 ECHO EF  30-35%, G2DD, Moderately reduced RV function, IVC 1.8cm with <50% respiratory variability.   - NYHA II-III. Subjectively always says breathing is doing well, Volume status elevated mainly pulm edema less LE edema this time, urine output marginal - GDMT and ischemic eval limited by renal function.  - continue bidil 1 tab TID - Cr slightly better output still marginal, continue lasix 120 BID, renal assisting with management of diuresis have added metolazone '5mg'$  today    2. T2DM: - Hgb A1C 9.1 (6/22). - Not a candidate for SGLT2i (GFR and hx of urosepsis).   3. HTN: - Bidil as above   4. AKI on CKD Stage 4: - SCr baseline 2.8-3.2 - 3.65>3.72>3.49 - nephrology on board, watch closely with diuresis   5. Tremor, confusion, somnolence -2/2 hypercarbia, resolved with bipap therapy -continue diuresis, bipap prn, monitor for recurrence.    6. Hyperkalemia: -has normalized with lasix/lokelma -monitor and treat respiratory acidosis as above  Length of  Stay: 2  Katherine Roan, MD  04/26/2021, 7:55 AM  Advanced Heart Failure Team Pager [phone] (M-F; 7a - 4p)  Please contact [facility] for night-coverage after hours (4p -7a ) and weekends on [email or web address]    Katherine Roan, MD  7:55 AM  Patient seen and examined with the above-signed Advanced Practice Provider and/or Housestaff. I personally reviewed laboratory data, imaging studies and relevant notes. I independently examined the patient and formulated the important aspects of the plan. I have edited the note to reflect any of my changes or salient points. I have personally discussed the plan with the patient and/or family.  Diuresing modestly with IV lasix. SCr slightly improved. Hyperkalemia has resolved. Feels ok. Weak.   General:  Weak appearing. No resp difficulty HEENT: normal Neck: supple. JVP to jaw  Carotids 2+ bilat; no bruits. No lymphadenopathy or thryomegaly appreciated. Cor: PMI nondisplaced. Regular rate & rhythm. No rubs, gallops or murmurs. Lungs: mild basilar crackles. Abdomen: soft, nontender, nondistended. No hepatosplenomegaly. No bruits or masses. Good bowel sounds. Extremities: no cyanosis, clubbing, rash, edema Neuro: alert & orientedx3, cranial nerves grossly intact. moves all 4 extremities w/o difficulty. Affect pleasant  Diuresing modestly with IV lasix. Agree with metolazone today. Scr slightly improved. Nephrology following as well. Likely poor candidate for long-term HD.  Glori Bickers, MD  6:12 PM

## 2021-04-27 DIAGNOSIS — E785 Hyperlipidemia, unspecified: Secondary | ICD-10-CM

## 2021-04-27 DIAGNOSIS — E1169 Type 2 diabetes mellitus with other specified complication: Secondary | ICD-10-CM

## 2021-04-27 DIAGNOSIS — D539 Nutritional anemia, unspecified: Secondary | ICD-10-CM

## 2021-04-27 LAB — GLUCOSE, CAPILLARY
Glucose-Capillary: 234 mg/dL — ABNORMAL HIGH (ref 70–99)
Glucose-Capillary: 266 mg/dL — ABNORMAL HIGH (ref 70–99)
Glucose-Capillary: 198 mg/dL — ABNORMAL HIGH (ref 70–99)
Glucose-Capillary: 198 mg/dL — ABNORMAL HIGH (ref 70–99)

## 2021-04-27 LAB — FERRITIN: Ferritin: 242 ng/mL (ref 11–307)

## 2021-04-27 LAB — RENAL FUNCTION PANEL
Albumin: 3.5 g/dL (ref 3.5–5.0)
Anion gap: 8 (ref 5–15)
BUN: 83 mg/dL — ABNORMAL HIGH (ref 8–23)
CO2: 45 mmol/L — ABNORMAL HIGH (ref 22–32)
Calcium: 8.7 mg/dL — ABNORMAL LOW (ref 8.9–10.3)
Chloride: 87 mmol/L — ABNORMAL LOW (ref 98–111)
Creatinine, Ser: 3.73 mg/dL — ABNORMAL HIGH (ref 0.44–1.00)
GFR, Estimated: 13 mL/min — ABNORMAL LOW (ref >60)
Glucose, Bld: 142 mg/dL — ABNORMAL HIGH (ref 70–99)
Phosphorus: 4.7 mg/dL — ABNORMAL HIGH (ref 2.5–4.6)
Potassium: 4.6 mmol/L (ref 3.5–5.1)
Sodium: 140 mmol/L (ref 135–145)

## 2021-04-27 LAB — IRON AND TIBC
Iron: 56 ug/dL (ref 28–170)
Saturation Ratios: 33 % — ABNORMAL HIGH (ref 10.4–31.8)
TIBC: 172 ug/dL — ABNORMAL LOW (ref 250–450)
UIBC: 116 ug/dL

## 2021-04-27 NOTE — Progress Notes (Signed)
PROGRESS NOTE    Beverly Martin  [id number] DOB: [birth year] DOA: 04/23/2021 PCP: Billie Ruddy, MD    Brief Narrative:  Beverly Martin was admitted to the hospital with the working diagnosis of acute decompensation of chronic systolic heart failure.   64 year old female past medical history for hypertension, dyslipidemia, heart failure, coronary artery disease status post bypass grafting, type 2 diabetes mellitus, anemia and chronic kidney disease stage IV.  She was asked to be brought to the hospital because concerns of her blood sugars.  Apparently patient had been noted to have lower extremity swelling and orthopnea.  On her initial physical examination blood pressure 139/73, heart rate 83, respiratory rate 16, oxygen saturation 100% on supplemental oxygen, her lungs had decreased breath sounds bilaterally with positive rales, heart S1-S2, present, rhythmic, abdomen soft nontender, positive lower extremity edema.   Sodium 137, potassium 6.0, chloride 89, bicarb 37, glucose 384, BUN 73, creatinine 3.65, BNP >4500, high sensitive troponin 29-58-222, white count 7.9, hemoglobin 8.6, hematocrit 28.6, platelets 131. SARS COVID-19 negative.   Urinalysis specific gravity 1.015, >50 white cells.   Chest radiograph with cardiomegaly, hilar vascular congestion, fluid in the right fissure, small pleural effusions bilaterally.   EKG 99 bpm, rightward axis, intervals, sinus rhythm, Q-wave V1-V2, ST depressions, T wave inversion on lead II, lead III, aVF, V5-V6   Patient placed on aggressive diuresis for volume overload   Assessment & Plan:   Principal Problem:   Acute on chronic combined systolic (congestive) and diastolic (congestive) heart failure (HCC) Active Problems:   Essential hypertension   Type 2 diabetes mellitus with hyperlipidemia (HCC)   CAD (coronary artery disease)   Acute kidney injury superimposed on chronic kidney disease (HCC)   Elevated troponin   Hyperkalemia    Macrocytic anemia   Thrombocytopenia (HCC)    Acute on chronic systolic heart failure decompensation.   Urine out put over last 24 hrs is  2,275m. Lower extremity edema improved, mild rales on lung examination, positive JVD,  Diuresis with furosemide and metolazone.  Continue with Bidil, and hold on RAAS blockade due to low GFR.    2. AKI on Ckd stage IV, hyperkalemia. Metabolic alkalosis Renal function with serum cr at 3,73 with K at 4,6 and serum bicarbonate at 45. Urine output 2,250 ml.  Continue aggressive diuresis with furosemide and metolazone.  Anemia of chronic renal disease continue with aranesp.   Follow up renal function in am.    3. T2DM. Fasting glucose is 142 mg/dl. Continue with insulin sliding scale for glucose cover and monitoring. Basal insulin with 5 units glargine.   4. Chronic anemia and thrombocytopenia.  Stable cell count.    5. UTI.  Culture with less than 100.00 CFU. Patient with no urinary symptoms.  Will discontinue antibiotic therapy.  Had fluconazole for vaginal yeast.   Patient continue to be at high risk for worsening heart failure   Status is: Inpatient  Remains inpatient appropriate because:Inpatient level of care appropriate due to severity of illness  Dispo: The patient is from: Home              Anticipated d/c is to: Home              Patient currently is not medically stable to d/c.   Difficult to place patient No   DVT prophylaxis: Heparin   Code Status:   full  Family Communication:  No family at the bedside       Consultants:  Cardiology  Nephrology    Subjective: Patient is feeling better, continue to be very weak and deconditioned, dyspnea is improving.   Objective: Vitals:   04/26/21 2206 04/27/21 0003 04/27/21 0315 04/27/21 1126  BP:  131/76 136/72 (!) 112/56  Pulse: 83 (!) 104 87 91  Resp: '20 18 17 18  '$ Temp:  98 F (36.7 C) 97.9 F (36.6 C) 98.4 F (36.9 C)  TempSrc:  Oral Oral Oral  SpO2: 98% 100%  100% 100%  Weight:   52.1 kg   Height:        Intake/Output Summary (Last 24 hours) at 04/27/2021 1640 Last data filed at 04/27/2021 1311 Gross per 24 hour  Intake 531.12 ml  Output 1300 ml  Net -768.88 ml   Filed Weights   04/26/21 0035 04/26/21 0500 04/27/21 0315  Weight: 53 kg 53 kg 52.1 kg    Examination:   General: Not in pain or dyspnea, deconditioned  Neurology: Awake and alert, non focal  E ENT: no pallor, no icterus, oral mucosa moist Cardiovascular: Positive JVD. S1-S2 present, rhythmic, no gallops, rubs, or murmurs. No lower extremity edema. Pulmonary: positive breath sounds bilaterally, with no wheezing, rhonchi, mild rales. Gastrointestinal. Abdomen soft and non tender Skin. No rashes Musculoskeletal: no joint deformities     Data Reviewed: I have personally reviewed following labs and imaging studies  CBC: Recent Labs  Lab 04/23/21 2259 04/24/21 1241 04/25/21 0339  WBC 7.9  --  5.6  NEUTROABS  --   --  4.8  HGB 8.6* 9.9* 7.9*  HCT 28.6* 29.0* 26.2*  MCV 105.5*  --  102.7*  PLT 131*  --  123456*   Basic Metabolic Panel: Recent Labs  Lab 04/23/21 2259 04/24/21 1223 04/24/21 1241 04/25/21 0339 04/25/21 1154 04/26/21 0346 04/27/21 0320  NA 137  --  137 141 140 141 140  K 6.0*   < > 5.6* 6.0* 5.0 4.5 4.6  CL 89*  --   --  90* 88* 90* 87*  CO2 37*  --   --  42* 42* 41* 45*  GLUCOSE 384*  --   --  176* 290* 106* 142*  BUN 73*  --   --  81* 81* 80* 83*  CREATININE 3.65*  --   --  3.72* 3.69* 3.49* 3.73*  CALCIUM 9.2  --   --  9.3 8.8* 9.0 8.7*  PHOS  --   --   --   --   --  4.5 4.7*   < > = values in this interval not displayed.   GFR: Estimated Creatinine Clearance: 12.2 mL/min (A) (by C-G formula based on SCr of 3.73 mg/dL (H)). Liver Function Tests: Recent Labs  Lab 04/26/21 0346 04/27/21 0320  ALBUMIN 3.6 3.5   No results for input(s): LIPASE, AMYLASE in the last 168 hours. No results for input(s): AMMONIA in the last 168  hours. Coagulation Profile: No results for input(s): INR, PROTIME in the last 168 hours. Cardiac Enzymes: No results for input(s): CKTOTAL, CKMB, CKMBINDEX, TROPONINI in the last 168 hours. BNP (last 3 results) Recent Labs    12/06/20 0927  PROBNP >5000.0*   HbA1C: No results for input(s): HGBA1C in the last 72 hours. CBG: Recent Labs  Lab 04/26/21 1656 04/26/21 2124 04/27/21 0919 04/27/21 1127 04/27/21 1627  GLUCAP 173* 130* 234* 266* 198*   Lipid Profile: No results for input(s): CHOL, HDL, LDLCALC, TRIG, CHOLHDL, LDLDIRECT in the last 72 hours. Thyroid Function Tests: No results for input(s): TSH, T4TOTAL,  FREET4, T3FREE, THYROIDAB in the last 72 hours. Anemia Panel: Recent Labs    04/25/21 0339  VITAMINB12 424  FOLATE 12.6  FERRITIN 242  TIBC 172*  IRON 56      Radiology Studies: I have reviewed all of the imaging during this hospital visit personally     Scheduled Meds:  aspirin EC  81 mg Oral Daily   atorvastatin  40 mg Oral Daily   cephALEXin  250 mg Oral Q12H   darbepoetin (ARANESP) injection - NON-DIALYSIS  60 mcg Subcutaneous Q Thu-1800   heparin  5,000 Units Subcutaneous Q8H   insulin aspart  0-6 Units Subcutaneous TID WC   insulin glargine  5 Units Subcutaneous Daily   isosorbide-hydrALAZINE  1 tablet Oral TID   sodium chloride flush  3 mL Intravenous Q12H   Continuous Infusions:  sodium chloride     furosemide 120 mg (04/27/21 0928)     LOS: 3 days        Caylah Plouff Gerome Apley, MD

## 2021-04-27 NOTE — Progress Notes (Addendum)
[facility] Resident Progress Note   Subjective:   I/Os yesterday 404/2250, net neg 3.2L for admission - had rec'd metolazone '5mg'$  x 1 yest with lasix 120 IV BID. BUN stable 80s, Cr up from 3.5 to 3.7.  No new issues this AM.    Objective Vitals:   04/26/21 2008 04/26/21 2206 04/27/21 0003 04/27/21 0315  BP: (!) 149/74  131/76 136/72  Pulse: 86 83 (!) 104 87  Resp: '18 20 18 17  '$ Temp: 98.1 F (36.7 C)  98 F (36.7 C) 97.9 F (36.6 C)  TempSrc: Oral  Oral Oral  SpO2: 100% 98% 100% 100%  Weight:    52.1 kg  Height:       Physical Exam General: chronically ill appearing elderly thin female, no acute distress Heart: RRR, S1 and S2 present, no mr/g Lungs: persistent bibasilar crackles, on 2L O2 Abdomen: nondistended, soft, nontender, +BS Extremities:no edema noted; distal pulses present  Dialysis Access: None   Additional Objective Labs: Basic Metabolic Panel: Recent Labs  Lab 04/25/21 1154 04/26/21 0346 04/27/21 0320  NA 140 141 140  K 5.0 4.5 4.6  CL 88* 90* 87*  CO2 42* 41* 45*  GLUCOSE 290* 106* 142*  BUN 81* 80* 83*  CREATININE 3.69* 3.49* 3.73*  CALCIUM 8.8* 9.0 8.7*  PHOS  --  4.5 4.7*    CBC: Recent Labs  Lab 04/23/21 2259 04/24/21 1241 04/25/21 0339  WBC 7.9  --  5.6  NEUTROABS  --   --  4.8  HGB 8.6* 9.9* 7.9*  HCT 28.6* 29.0* 26.2*  MCV 105.5*  --  102.7*  PLT 131*  --  113*    Blood Culture    Component Value Date/Time   SDES BLOOD LEFT ANTECUBITAL 10/20/2017 2003   SPECREQUEST  10/20/2017 2003    BOTTLES DRAWN AEROBIC AND ANAEROBIC Blood Culture adequate volume   CULT (A) 10/20/2017 2003    KLEBSIELLA PNEUMONIAE SUSCEPTIBILITIES PERFORMED ON PREVIOUS CULTURE WITHIN THE LAST 5 DAYS.    REPTSTATUS 10/23/2017 FINAL 10/20/2017 2003    Cardiac Enzymes: No results for input(s): CKTOTAL, CKMB, CKMBINDEX, TROPONINI in the last 168 hours. CBG: Recent Labs  Lab 04/25/21 2059 04/26/21 0559 04/26/21 1139 04/26/21 1656  04/26/21 2124  GLUCAP 103* 112* 170* 173* 130*    Studies/Results: ECHOCARDIOGRAM COMPLETE  Result Date: 04/25/2021    ECHOCARDIOGRAM REPORT   Patient Name:   Beverly Martin Date of Exam: 04/25/2021 Medical Rec #:  [id number]      Height:       64.0 in Accession #:    [id number]     Weight:       118.1 lb Date of Birth:  March 16, 1956      BSA:          1.564 m Patient Age:    65 years       BP:           140/73 mmHg Patient Gender: F              HR:           98 bpm. Exam Location:  Inpatient Procedure: 2D Echo, Cardiac Doppler and Color Doppler Indications:    CHF-Acute Systolic [id number]  History:        Patient has prior history of Echocardiogram examinations, most                 recent 01/03/2021. CHF, CAD; Risk Factors:Diabetes, Hypertension  and Dyslipidemia.  Sonographer:    Bernadene Person RDCS Referring Phys: [id number] [place]  1. Left ventricular ejection fraction, by estimation, is 30 to 35%. The left ventricle has moderately decreased function. The left ventricle demonstrates global hypokinesis. There is mild left ventricular hypertrophy. Left ventricular diastolic parameters are consistent with Grade II diastolic dysfunction (pseudonormalization).  2. Right ventricular systolic function is moderately reduced. The right ventricular size is mildly enlarged. There is moderately elevated pulmonary artery systolic pressure. The estimated right ventricular systolic pressure is 0000000 mmHg.  3. Left atrial size was moderately dilated.  4. The mitral valve is normal in structure. Trivial mitral valve regurgitation. No evidence of mitral stenosis.  5. The aortic valve is tricuspid. Aortic valve regurgitation is not visualized. No aortic stenosis is present.  6. The inferior vena cava is normal in size with <50% respiratory variability, suggesting right atrial pressure of 8 mmHg. FINDINGS  Left Ventricle: Left ventricular ejection fraction, by estimation, is 30 to 35%. The left  ventricle has moderately decreased function. The left ventricle demonstrates global hypokinesis. The left ventricular internal cavity size was normal in size. There is mild left ventricular hypertrophy. Left ventricular diastolic parameters are consistent with Grade II diastolic dysfunction (pseudonormalization). Right Ventricle: The right ventricular size is mildly enlarged. No increase in right ventricular wall thickness. Right ventricular systolic function is moderately reduced. There is moderately elevated pulmonary artery systolic pressure. The tricuspid regurgitant velocity is 3.39 m/s, and with an assumed right atrial pressure of 8 mmHg, the estimated right ventricular systolic pressure is 0000000 mmHg. Left Atrium: Left atrial size was moderately dilated. Right Atrium: Right atrial size was normal in size. Pericardium: There is no evidence of pericardial effusion. Mitral Valve: The mitral valve is normal in structure. Trivial mitral valve regurgitation. No evidence of mitral valve stenosis. Tricuspid Valve: The tricuspid valve is normal in structure. Tricuspid valve regurgitation is mild. Aortic Valve: The aortic valve is tricuspid. Aortic valve regurgitation is not visualized. No aortic stenosis is present. Pulmonic Valve: The pulmonic valve was normal in structure. Pulmonic valve regurgitation is mild. Aorta: The aortic root is normal in size and structure. Venous: The inferior vena cava is normal in size with less than 50% respiratory variability, suggesting right atrial pressure of 8 mmHg. IAS/Shunts: No atrial level shunt detected by color flow Doppler.  LEFT VENTRICLE PLAX 2D LVIDd:         4.80 cm      Diastology LVIDs:         3.90 cm      LV e' medial:    3.26 cm/s LV PW:         1.30 cm      LV E/e' medial:  31.0 LV IVS:        0.90 cm      LV e' lateral:   4.39 cm/s LVOT diam:     1.70 cm      LV E/e' lateral: 23.0 LV SV:         43 LV SV Index:   27 LVOT Area:     2.27 cm  LV Volumes (MOD) LV vol  d, MOD A2C: 106.0 ml LV vol d, MOD A4C: 97.9 ml LV vol s, MOD A2C: 69.7 ml LV vol s, MOD A4C: 67.4 ml LV SV MOD A2C:     36.3 ml LV SV MOD A4C:     97.9 ml LV SV MOD BP:      35.7 ml  RIGHT VENTRICLE RV S prime:     6.47 cm/s TAPSE (M-mode): 1.2 cm LEFT ATRIUM             Index       RIGHT ATRIUM           Index LA diam:        3.70 cm 2.37 cm/m  RA Area:     12.80 cm LA Vol (A2C):   68.1 ml 43.54 ml/m RA Volume:   30.00 ml  19.18 ml/m LA Vol (A4C):   68.4 ml 43.74 ml/m LA Biplane Vol: 69.9 ml 44.69 ml/m  AORTIC VALVE LVOT Vmax:   96.80 cm/s LVOT Vmean:  70.800 cm/s LVOT VTI:    0.188 m  AORTA Ao Root diam: 2.60 cm Ao Asc diam:  2.80 cm MITRAL VALVE                TRICUSPID VALVE MV Area (PHT): 3.99 cm     TR Peak grad:   46.0 mmHg MV Decel Time: 190 msec     TR Vmax:        339.00 cm/s MV E velocity: 101.00 cm/s MV A velocity: 92.60 cm/s   SHUNTS MV E/A ratio:  1.09         Systemic VTI:  0.19 m                             Systemic Diam: 1.70 cm Loralie Champagne MD Electronically signed by Loralie Champagne MD Signature Date/Time: 04/25/2021/5:10:32 PM    Final    Medications:  sodium chloride     furosemide 120 mg (04/26/21 1718)    aspirin EC  81 mg Oral Daily   atorvastatin  40 mg Oral Daily   cephALEXin  250 mg Oral Q12H   darbepoetin (ARANESP) injection - NON-DIALYSIS  60 mcg Subcutaneous Q Thu-1800   heparin  5,000 Units Subcutaneous Q8H   insulin aspart  0-6 Units Subcutaneous TID WC   insulin glargine  5 Units Subcutaneous Daily   isosorbide-hydrALAZINE  1 tablet Oral TID   sodium chloride flush  3 mL Intravenous Q12H   Assessment/Plan: Acute on chronic combined systolic and diastolic heart failure: suspected to be mixed ischemic and NICM complicated by medication nonadherence. BNP>4500. Metolazone augmented diuresis 7/15. Continues to be hypervolemic on exam. Cont IV diuresis today but likely can switch to po tomorrow.  Has historically needed inotropic support to augment diuresis. Cont  diuresis and BiDil per HF team. 2g na diet, fluid restriction.  AKI ON CKD IV: Baseline sCr 2-3; on admission 3.65>3.72>3.49 > 3.7 this AM.  Consistent with cardiorenal syndrome on background of CKD from HTN and DM.  Renal US 04/24/21 no obstruction c/w CKD.  Subnephrotic proteinuria secondary to DM, RAAS blockade/SGLT2i use not possible with current GFR.  With Cr ^ some today will hold the metolazone but cont IV lasix. Will continue to monitor renal function.  No indication for RRT at this time. Continue to hold all nephrotoxic medications and renally dose meds. UTI - culture with GNR - on cephalexin, f/u final culture/sens.  Has purewick, no foley.  Hyperkalemia: resolved, cont low K diet.  Metabolic alkalosis: This seems to be chronic. HCO3 37>42>41 in setting of diuresis.  Not a good candidate for diamox due to compromised renal function. She does not need urgent RRT at this time. Will continue to trend. Macrocytic anemia w/hx of normocytic anemia: prior iron studies  wnl - will recheck to ensure she doesn't need IV iron while hospitalized. Vitamin B12 and folate wnl. Aranesp weekly.  Type II DM: insulin dosing per primary team   Jannifer Hick MD [place] Pager [phone]

## 2021-04-27 NOTE — Progress Notes (Addendum)
Advanced Heart Failure Team Rounding Note   Primary Physician:Dr. Volanda Napoleon Primary Cardiologist:  Dr. Haroldine Laws  Reason for Consultation: Acute on chronic Combined Systolic and Diastolic CHF  HPI:    Feels weak. Denies SOB, orthopnea or PND.   Out 2.2 L on lasix 120 iv bid and metolazone. Weight down 2 pounds. SCcr 3.5 -> 3.7   Ucx + for klebsiella. On ceftriaxone (has Purewick. No Foley)   Objective:    Vital Signs:   Temp:  [97.9 F (36.6 C)-98.4 F (36.9 C)] 98.4 F (36.9 C) (07/16 1126) Pulse Rate:  [83-104] 91 (07/16 1126) Resp:  [17-20] 18 (07/16 1126) BP: (112-149)/(56-76) 112/56 (07/16 1126) SpO2:  [98 %-100 %] 100 % (07/16 1126) Weight:  [52.1 kg] 52.1 kg (07/16 0315) Last BM Date: 04/23/21  Weight change: Filed Weights   04/26/21 0035 04/26/21 0500 04/27/21 0315  Weight: 53 kg 53 kg 52.1 kg    Intake/Output:   Intake/Output Summary (Last 24 hours) at 04/27/2021 1215 Last data filed at 04/27/2021 0954 Gross per 24 hour  Intake 474.12 ml  Output 2250 ml  Net -1775.88 ml       Physical Exam    General:  Weak appearing. No resp difficulty HEENT: normal Neck: supple. JVP to jaw Carotids 2+ bilat; no bruits. No lymphadenopathy or thryomegaly appreciated. Cor: PMI nondisplaced. Regular rate & rhythm. No rubs, gallops or murmurs. Lungs: clear Abdomen: soft, nontender, nondistended. No hepatosplenomegaly. No bruits or masses. Good bowel sounds. Extremities: no cyanosis, clubbing, rash, edema Neuro: alert & orientedx3, cranial nerves grossly intact. moves all 4 extremities w/o difficulty. Affect pleasant   Telemetry   NSR 80-90s Personally reviewed   Labs   Basic Metabolic Panel: Recent Labs  Lab 04/23/21 2259 04/24/21 1223 04/24/21 1241 04/25/21 0339 04/25/21 1154 04/26/21 0346 04/27/21 0320  NA 137  --  137 141 140 141 140  K 6.0*   < > 5.6* 6.0* 5.0 4.5 4.6  CL 89*  --   --  90* 88* 90* 87*  CO2 37*  --   --  42* 42* 41* 45*   GLUCOSE 384*  --   --  176* 290* 106* 142*  BUN 73*  --   --  81* 81* 80* 83*  CREATININE 3.65*  --   --  3.72* 3.69* 3.49* 3.73*  CALCIUM 9.2  --   --  9.3 8.8* 9.0 8.7*  PHOS  --   --   --   --   --  4.5 4.7*   < > = values in this interval not displayed.     Liver Function Tests: Recent Labs  Lab 04/26/21 0346 04/27/21 0320  ALBUMIN 3.6 3.5    No results for input(s): LIPASE, AMYLASE in the last 168 hours. No results for input(s): AMMONIA in the last 168 hours.  CBC: Recent Labs  Lab 04/23/21 2259 04/24/21 1241 04/25/21 0339  WBC 7.9  --  5.6  NEUTROABS  --   --  4.8  HGB 8.6* 9.9* 7.9*  HCT 28.6* 29.0* 26.2*  MCV 105.5*  --  102.7*  PLT 131*  --  113*     Cardiac Enzymes: No results for input(s): CKTOTAL, CKMB, CKMBINDEX, TROPONINI in the last 168 hours.  BNP: BNP (last 3 results) Recent Labs    03/14/21 0934 04/10/21 1129 04/24/21 1223  BNP >4,500.0* >4,500.0* >4,500.0*     ProBNP (last 3 results) Recent Labs    12/06/20 0927  PROBNP >  5000.0*      CBG: Recent Labs  Lab 04/26/21 1139 04/26/21 1656 04/26/21 2124 04/27/21 0919 04/27/21 1127  GLUCAP 170* 173* 130* 234* 266*     Coagulation Studies: No results for input(s): LABPROT, INR in the last 72 hours.   Imaging   No results found.   Medications:     Current Medications:  aspirin EC  81 mg Oral Daily   atorvastatin  40 mg Oral Daily   cephALEXin  250 mg Oral Q12H   darbepoetin (ARANESP) injection - NON-DIALYSIS  60 mcg Subcutaneous Q Thu-1800   heparin  5,000 Units Subcutaneous Q8H   insulin aspart  0-6 Units Subcutaneous TID WC   insulin glargine  5 Units Subcutaneous Daily   isosorbide-hydrALAZINE  1 tablet Oral TID   sodium chloride flush  3 mL Intravenous Q12H    Infusions:  sodium chloride     furosemide 120 mg (04/27/21 0928)    Assessment/Plan   1. Acute on Chronic Combined Systolic and Diastolic CHF: - Likely mixed ischemic and NICM, poorly  controlled HTN, poor medication adherence - Echo 12/2020 EF down from 45-50% in 2019 to 20-25%, RV moderately reduced - 04/25/21 ECHO EF  30-35%, G2DD, Moderately reduced RV function, IVC 1.8cm with <50% respiratory variability.   - NYHA III - Volume status remains elevated. Continue diuresis as renal function tolerates ->  lasix 120 IV bid and metolazone for today - GDMT and ischemic eval limited by renal function.  - continue bidil 1 tab TID - not candidate for advanced HF therapies.    2. T2DM: - Hgb A1C 9.1 (6/22). - Not a candidate for SGLT2i (GFR and hx of urosepsis).   3. HTN: - Bidil as above   4. AKI on CKD Stage 4: - SCr baseline 2.8-3.2 - 3.65>3.72>3.49> 3.7 - D/w Nephrology. Will likely need HD in near future. Will order vein mapping here.   5. Tremor, confusion, somnolence -2/2 hypercarbia, resolved with bipap therapy  6. Hyperkalemia: -has normalized with lasix/lokelma - follow daily BMET  7. Klebsiella UTI - on ceftriaxone per primary team   Length of Stay: 3  Glori Bickers, MD  04/27/2021, 12:15 PM  Advanced Heart Failure Team Pager [phone] (M-F; [place])  Please contact [facility] for night-coverage after hours (4p -7a ) and weekends on [email or web address]

## 2021-04-27 NOTE — Plan of Care (Signed)

## 2021-04-28 ENCOUNTER — Encounter (HOSPITAL_COMMUNITY): Payer: BC Managed Care – PPO

## 2021-04-28 DIAGNOSIS — D696 Thrombocytopenia, unspecified: Secondary | ICD-10-CM

## 2021-04-28 LAB — RENAL FUNCTION PANEL
Albumin: 3.3 g/dL — ABNORMAL LOW (ref 3.5–5.0)
Anion gap: 5 (ref 5–15)
BUN: 82 mg/dL — ABNORMAL HIGH (ref 8–23)
CO2: 47 mmol/L — ABNORMAL HIGH (ref 22–32)
Calcium: 8.6 mg/dL — ABNORMAL LOW (ref 8.9–10.3)
Chloride: 85 mmol/L — ABNORMAL LOW (ref 98–111)
Creatinine, Ser: 3.61 mg/dL — ABNORMAL HIGH (ref 0.44–1.00)
GFR, Estimated: 13 mL/min — ABNORMAL LOW (ref >60)
Glucose, Bld: 130 mg/dL — ABNORMAL HIGH (ref 70–99)
Phosphorus: 4.9 mg/dL — ABNORMAL HIGH (ref 2.5–4.6)
Potassium: 4.1 mmol/L (ref 3.5–5.1)
Sodium: 137 mmol/L (ref 135–145)

## 2021-04-28 LAB — GLUCOSE, CAPILLARY
Glucose-Capillary: 121 mg/dL — ABNORMAL HIGH (ref 70–99)
Glucose-Capillary: 195 mg/dL — ABNORMAL HIGH (ref 70–99)
Glucose-Capillary: 262 mg/dL — ABNORMAL HIGH (ref 70–99)
Glucose-Capillary: 211 mg/dL — ABNORMAL HIGH (ref 70–99)

## 2021-04-28 LAB — URINE CULTURE: Culture: 80000 — AB

## 2021-04-28 MED ORDER — METOLAZONE 5 MG PO TABS
5.0000 mg | ORAL_TABLET | Freq: Once | ORAL | Status: AC
  Administered 2021-04-28: 5 mg via ORAL
  Filled 2021-04-28: qty 1

## 2021-04-28 NOTE — Progress Notes (Signed)
Beverly Martin Resident Progress Note   Subjective:   I/Os yesterday 300/1200, net neg 4.1L for admission. Wt from 53.5 to 51.6kg.  Had lasix 120 IV BID yesterday.   BUN stable 80s, Cr  from 3.7 to 3.6.  No new issues this AM.    Objective Vitals:   04/27/21 1126 04/27/21 1936 04/28/21 0348 04/28/21 0719  BP: (!) 112/56 115/62 136/71   Pulse: 91 88 82   Resp: '18 17 17   '$ Temp: 98.4 F (36.9 C) 98 F (36.7 C) (!) 97.5 F (36.4 C)   TempSrc: Oral Oral Oral   SpO2: 100% 100% 100% 98%  Weight:   51.6 kg   Height:       Physical Exam General: chronically ill appearing elderly thin female, no acute distress Heart: RRR, S1 and S2 present, no mr/g Lungs: persistent bibasilar crackles, on 2L O2 - really not a lot of improvement on exam to date Abdomen: nondistended, soft, nontender, +BS Extremities:no edema noted Dialysis Access: None   Additional Objective Labs: Basic Metabolic Panel: Recent Labs  Lab 04/26/21 0346 04/27/21 0320 04/28/21 0218  NA 141 140 137  K 4.5 4.6 4.1  CL 90* 87* 85*  CO2 41* 45* 47*  GLUCOSE 106* 142* 130*  BUN 80* 83* 82*  CREATININE 3.49* 3.73* 3.61*  CALCIUM 9.0 8.7* 8.6*  PHOS 4.5 4.7* 4.9*    CBC: Recent Labs  Lab 04/23/21 2259 04/24/21 1241 04/25/21 0339  WBC 7.9  --  5.6  NEUTROABS  --   --  4.8  HGB 8.6* 9.9* 7.9*  HCT 28.6* 29.0* 26.2*  MCV 105.5*  --  102.7*  PLT 131*  --  113*    Blood Culture    Component Value Date/Time   SDES URINE, CLEAN CATCH 04/26/2021 1404   SPECREQUEST  04/26/2021 1404    NONE Performed at [facility], [address] [place], [place] [address]    CULT 80,000 COLONIES/mL KLEBSIELLA PNEUMONIAE (A) 04/26/2021 1404   REPTSTATUS 04/28/2021 FINAL 04/26/2021 1404    Cardiac Enzymes: No results for input(s): CKTOTAL, CKMB, CKMBINDEX, TROPONINI in the last 168 hours. CBG: Recent Labs  Lab 04/27/21 0919 04/27/21 1127 04/27/21 1627 04/27/21 2034 04/28/21 0547  GLUCAP 234*  266* 198* 198* 121*    Studies/Results: No results found. Medications:  sodium chloride     furosemide 120 mg (04/28/21 0930)    aspirin EC  81 mg Oral Daily   atorvastatin  40 mg Oral Daily   darbepoetin (ARANESP) injection - NON-DIALYSIS  60 mcg Subcutaneous Q Thu-1800   heparin  5,000 Units Subcutaneous Q8H   insulin aspart  0-6 Units Subcutaneous TID WC   insulin glargine  5 Units Subcutaneous Daily   isosorbide-hydrALAZINE  1 tablet Oral TID   sodium chloride flush  3 mL Intravenous Q12H   Assessment/Plan: Acute on chronic combined systolic and diastolic heart failure: suspected to be mixed ischemic and NICM complicated by medication nonadherence. BNP>4500. Metolazone augmented diuresis 7/15. Continues to be hypervolemic on exam. Cont IV diuresis today and supplement with metolazone '5mg'$  this AM.  Cont diuresis and BiDil per HF team. 2g na diet, fluid restriction.  She tells me she eats at [employer] a lot - we discussed the importance of avoiding sodium with her co morbidities and readmissions.  AKI ON CKD IV: Baseline sCr 2-3; on admission 3.65>3.72>3.49 > 3.7 > 3.6 this AM.  Consistent with cardiorenal syndrome on background of CKD from HTN and DM.  Renal US 04/24/21 no obstruction c/w CKD.  Subnephrotic proteinuria secondary to DM, RAAS blockade/SGLT2i use not possible with current GFR.   Will continue to monitor renal function.  No indication for RRT at this time but steadily marching in that direction recently; vein map.  Close nephrology f/u on d/c. Continue to hold all nephrotoxic medications and renally dose meds. UTI - culture with Klebsiella - on cephalexin, complete course  Has purewick, no foley.  Hyperkalemia: resolved, cont low K diet.  Metabolic alkalosis: This seems to be chronic. HCO3 37>42>41 in setting of diuresis.  Not a good candidate for diamox due to compromised renal function. She does not need urgent RRT at this time. Will continue to trend. Macrocytic anemia w/hx of  normocytic anemia: Iron replete. Vitamin B12 and folate wnl. Aranesp weekly - will need to continue outpt.  Type II DM: insulin dosing per primary team   Jannifer Hick MD [place] Pager [phone]

## 2021-04-28 NOTE — Progress Notes (Signed)
Pt refuses CPAP for tonight.   

## 2021-04-28 NOTE — Progress Notes (Signed)
PROGRESS NOTE    Beverly Martin  [id number] DOB: 1955/12/19 DOA: 04/23/2021 PCP: Billie Ruddy, MD    Brief Narrative:  Beverly Martin was admitted to the hospital with the working diagnosis of acute decompensation of chronic systolic heart failure.   65 year old female past medical history for hypertension, dyslipidemia, heart failure, coronary artery disease status post bypass grafting, type 2 diabetes mellitus, anemia and chronic kidney disease stage IV.  She was asked to be brought to the hospital because concerns of her blood sugars.  Apparently patient had been noted to have lower extremity swelling and orthopnea.  On her initial physical examination blood pressure 139/73, heart rate 83, respiratory rate 16, oxygen saturation 100% on supplemental oxygen, her lungs had decreased breath sounds bilaterally with positive rales, heart S1-S2, present, rhythmic, abdomen soft nontender, positive lower extremity edema.   Sodium 137, potassium 6.0, chloride 89, bicarb 37, glucose 384, BUN 73, creatinine 3.65, BNP >4500, high sensitive troponin 29-58-222, white count 7.9, hemoglobin 8.6, hematocrit 28.6, platelets 131. SARS COVID-19 negative.   Urinalysis specific gravity 1.015, >50 white cells.   Chest radiograph with cardiomegaly, hilar vascular congestion, fluid in the right fissure, small pleural effusions bilaterally.   EKG 99 bpm, rightward axis, intervals, sinus rhythm, Q-wave V1-V2, ST depressions, T wave inversion on lead II, lead III, aVF, V5-V6   Patient placed on aggressive diuresis for volume overload     Assessment & Plan:   Principal Problem:   Acute on chronic combined systolic (congestive) and diastolic (congestive) heart failure (HCC) Active Problems:   Essential hypertension   Type 2 diabetes mellitus with hyperlipidemia (HCC)   CAD (coronary artery disease)   Acute kidney injury superimposed on chronic kidney disease (HCC)   Elevated troponin   Hyperkalemia    Macrocytic anemia   Thrombocytopenia (HCC)    Acute on chronic systolic heart failure decompensation.   Urine out put over last 24 hrs is 1,200 ml. Volume status continue to improve.    Continue aggressive diuresis with furosemide and metolazone. Heart failure management with Bidil, and hold on RAAS blockade due to low GFR.   Possible transition to oral diuretic therapy in am.    2. AKI on Ckd stage IV, hyperkalemia. Metabolic alkalosis Renal function with serum cr at 3,61 and K at 4,1 and serum bicarbonate at 47.  Anemia of chronic renal disease continue with aranesp. Patient on furosemide 120 mg bid Had metolazone 07/16 and today.    Continue to follow up renal function in am.    3. T2DM. Fasting glucose is 130 mg/dl. On insulin sliding scale for glucose cover and monitoring. Continue with basal insulin with 5 units glargine.   4. Chronic anemia and thrombocytopenia.  Stable cell count.    5. UTI.  Culture with less than 100.00 CFU. Patient with no urinary symptoms. Will discontinue antibiotic therapy. Had fluconazole for vaginal yeast.    Patient continue to be at high risk for worsening heart and renal failure   Status is: Inpatient  Remains inpatient appropriate because:IV treatments appropriate due to intensity of illness or inability to take PO  Dispo: The patient is from: Home              Anticipated d/c is to: Home              Patient currently is not medically stable to d/c.   Difficult to place patient No   DVT prophylaxis: Heparin   Code Status:   full  Family Communication:  No family at the bedside      Consultants:  Nephrology  Cardiology     Subjective: Patient is feeling better. Dyspnea and lower extremity edema contineu to improve.   Objective: Vitals:   04/28/21 1000 04/28/21 1030 04/28/21 1035 04/28/21 1117  BP: 126/67     Pulse: 88     Resp: 18     Temp: 97.8 F (36.6 C)     TempSrc: Oral     SpO2: 98% (!) 88% 96% 97%   Weight:      Height:        Intake/Output Summary (Last 24 hours) at 04/28/2021 1152 Last data filed at 04/28/2021 0601 Gross per 24 hour  Intake 177 ml  Output 1200 ml  Net -1023 ml   Filed Weights   04/26/21 0500 04/27/21 0315 04/28/21 0348  Weight: 53 kg 52.1 kg 51.6 kg    Examination:   General: Not in pain or dyspnea, deconditioned  Neurology: Awake and alert, non focal  E ENT: no pallor, no icterus, oral mucosa moist Cardiovascular: No JVD. S1-S2 present, rhythmic, no gallops, rubs, or murmurs. No lower extremity edema. Pulmonary: positive breath sounds bilaterally, adequate air movement, no wheezing,or rhonchi rales. Gastrointestinal. Abdomen soft and non tender Skin. No rashes Musculoskeletal: no joint deformities    Data Reviewed: I have personally reviewed following labs and imaging studies  CBC: Recent Labs  Lab 04/23/21 2259 04/24/21 1241 04/25/21 0339  WBC 7.9  --  5.6  NEUTROABS  --   --  4.8  HGB 8.6* 9.9* 7.9*  HCT 28.6* 29.0* 26.2*  MCV 105.5*  --  102.7*  PLT 131*  --  123456*   Basic Metabolic Panel: Recent Labs  Lab 04/25/21 0339 04/25/21 1154 04/26/21 0346 04/27/21 0320 04/28/21 0218  NA 141 140 141 140 137  K 6.0* 5.0 4.5 4.6 4.1  CL 90* 88* 90* 87* 85*  CO2 42* 42* 41* 45* 47*  GLUCOSE 176* 290* 106* 142* 130*  BUN 81* 81* 80* 83* 82*  CREATININE 3.72* 3.69* 3.49* 3.73* 3.61*  CALCIUM 9.3 8.8* 9.0 8.7* 8.6*  PHOS  --   --  4.5 4.7* 4.9*   GFR: Estimated Creatinine Clearance: 12.5 mL/min (A) (by C-G formula based on SCr of 3.61 mg/dL (H)). Liver Function Tests: Recent Labs  Lab 04/26/21 0346 04/27/21 0320 04/28/21 0218  ALBUMIN 3.6 3.5 3.3*   No results for input(s): LIPASE, AMYLASE in the last 168 hours. No results for input(s): AMMONIA in the last 168 hours. Coagulation Profile: No results for input(s): INR, PROTIME in the last 168 hours. Cardiac Enzymes: No results for input(s): CKTOTAL, CKMB, CKMBINDEX, TROPONINI in  the last 168 hours. BNP (last 3 results) Recent Labs    12/06/20 0927  PROBNP >5000.0*   HbA1C: No results for input(s): HGBA1C in the last 72 hours. CBG: Recent Labs  Lab 04/27/21 1127 04/27/21 1627 04/27/21 2034 04/28/21 0547 04/28/21 1143  GLUCAP 266* 198* 198* 121* 195*   Lipid Profile: No results for input(s): CHOL, HDL, LDLCALC, TRIG, CHOLHDL, LDLDIRECT in the last 72 hours. Thyroid Function Tests: No results for input(s): TSH, T4TOTAL, FREET4, T3FREE, THYROIDAB in the last 72 hours. Anemia Panel: No results for input(s): VITAMINB12, FOLATE, FERRITIN, TIBC, IRON, RETICCTPCT in the last 72 hours.    Radiology Studies: I have reviewed all of the imaging during this hospital visit personally     Scheduled Meds:  aspirin EC  81 mg Oral Daily  atorvastatin  40 mg Oral Daily   darbepoetin (ARANESP) injection - NON-DIALYSIS  60 mcg Subcutaneous Q Thu-1800   heparin  5,000 Units Subcutaneous Q8H   insulin aspart  0-6 Units Subcutaneous TID WC   insulin glargine  5 Units Subcutaneous Daily   isosorbide-hydrALAZINE  1 tablet Oral TID   metolazone  5 mg Oral Once   sodium chloride flush  3 mL Intravenous Q12H   Continuous Infusions:  sodium chloride     furosemide 120 mg (04/28/21 0930)     LOS: 4 days        Marcelus Dubberly Gerome Apley, MD

## 2021-04-28 NOTE — Progress Notes (Addendum)
Advanced Heart Failure Team Rounding Note   Primary Physician:Dr. Volanda Napoleon Primary Cardiologist:  Dr. Haroldine Laws  Reason for Consultation: Acute on chronic Combined Systolic and Diastolic CHF  HPI:    Remains on IV lasix 120 bid. Weight down 1 pound overnight. 4.5 pounds since admission.   Feels weak. Fatigued. Denies SOB. SCr stable 3.6-3.7 range. Serum bicarb rising.   Ucx + for klebsiella. On ceftriaxone (has Purewick. No Foley)   Objective:    Vital Signs:   Temp:  [97.5 F (36.4 C)-98 F (36.7 C)] 97.8 F (36.6 C) (07/17 1000) Pulse Rate:  [82-88] 88 (07/17 1000) Resp:  [17-18] 18 (07/17 1000) BP: (115-136)/(62-71) 126/67 (07/17 1000) SpO2:  [88 %-100 %] 97 % (07/17 1117) Weight:  [51.6 kg] 51.6 kg (07/17 0348) Last BM Date: 04/23/21  Weight change: Filed Weights   04/26/21 0500 04/27/21 0315 04/28/21 0348  Weight: 53 kg 52.1 kg 51.6 kg    Intake/Output:   Intake/Output Summary (Last 24 hours) at 04/28/2021 1145 Last data filed at 04/28/2021 0601 Gross per 24 hour  Intake 177 ml  Output 1200 ml  Net -1023 ml       Physical Exam    General:  Weak appearing. No resp difficulty HEENT: normal Neck: supple JVP 8 Carotids 2+ bilat; no bruits. No lymphadenopathy or thryomegaly appreciated. Cor: PMI nondisplaced. Regular rate & rhythm. No rubs, gallops or murmurs. Lungs: basilar crackles  Abdomen: soft, nontender, nondistended. No hepatosplenomegaly. No bruits or masses. Good bowel sounds. Extremities: no cyanosis, clubbing, rash, tr-1+ edema Neuro: alert & orientedx3, cranial nerves grossly intact. moves all 4 extremities w/o difficulty. Affect pleasant   Telemetry   NSR 80-90s Personally reviewed   Labs   Basic Metabolic Panel: Recent Labs  Lab 04/25/21 0339 04/25/21 1154 04/26/21 0346 04/27/21 0320 04/28/21 0218  NA 141 140 141 140 137  K 6.0* 5.0 4.5 4.6 4.1  CL 90* 88* 90* 87* 85*  CO2 42* 42* 41* 45* 47*  GLUCOSE 176* 290* 106* 142*  130*  BUN 81* 81* 80* 83* 82*  CREATININE 3.72* 3.69* 3.49* 3.73* 3.61*  CALCIUM 9.3 8.8* 9.0 8.7* 8.6*  PHOS  --   --  4.5 4.7* 4.9*     Liver Function Tests: Recent Labs  Lab 04/26/21 0346 04/27/21 0320 04/28/21 0218  ALBUMIN 3.6 3.5 3.3*    No results for input(s): LIPASE, AMYLASE in the last 168 hours. No results for input(s): AMMONIA in the last 168 hours.  CBC: Recent Labs  Lab 04/23/21 2259 04/24/21 1241 04/25/21 0339  WBC 7.9  --  5.6  NEUTROABS  --   --  4.8  HGB 8.6* 9.9* 7.9*  HCT 28.6* 29.0* 26.2*  MCV 105.5*  --  102.7*  PLT 131*  --  113*     Cardiac Enzymes: No results for input(s): CKTOTAL, CKMB, CKMBINDEX, TROPONINI in the last 168 hours.  BNP: BNP (last 3 results) Recent Labs    03/14/21 0934 04/10/21 1129 04/24/21 1223  BNP >4,500.0* >4,500.0* >4,500.0*     ProBNP (last 3 results) Recent Labs    12/06/20 0927  PROBNP >5000.0*      CBG: Recent Labs  Lab 04/27/21 1127 04/27/21 1627 04/27/21 2034 04/28/21 0547 04/28/21 1143  GLUCAP 266* 198* 198* 121* 195*     Coagulation Studies: No results for input(s): LABPROT, INR in the last 72 hours.   Imaging   No results found.   Medications:     Current  Medications:  aspirin EC  81 mg Oral Daily   atorvastatin  40 mg Oral Daily   darbepoetin (ARANESP) injection - NON-DIALYSIS  60 mcg Subcutaneous Q Thu-1800   heparin  5,000 Units Subcutaneous Q8H   insulin aspart  0-6 Units Subcutaneous TID WC   insulin glargine  5 Units Subcutaneous Daily   isosorbide-hydrALAZINE  1 tablet Oral TID   metolazone  5 mg Oral Once   sodium chloride flush  3 mL Intravenous Q12H    Infusions:  sodium chloride     furosemide 120 mg (04/28/21 0930)    Assessment/Plan   1. Acute on Chronic Combined Systolic and Diastolic CHF: - Likely mixed ischemic and NICM, poorly controlled HTN, poor medication adherence may also have progressive CAD but unable to do coronary angio due to CKD  4 - Echo 12/2020 EF down from 45-50% in 2019 to 20-25%, RV moderately reduced - 04/25/21 ECHO EF  30-35%, G2DD, Moderately reduced RV function, IVC 1.8cm with <50% respiratory variability.   - NYHA III - Very sluggish progress. Weight down 4.5 pounds total over the past few days despite IV lasix and metolazone. Still appears volume overloaded but serum bicarb rising steadily. Unable to give diamox with CKD 4.  - Options increasingly limited. Renal feels she will tolerate HD but not ready just yet, Vein mapping ordered. I am very concerned about her trajectory.  - GDMT and ischemic eval limited by renal function.  - continue bidil 1 tab TID - not candidate for advanced HF therapies. Ha refused Palliative services in past   2. CAD s/p CABG 2005 - No s/s angina - unable to do coronary angio due to CKD 4 - continue ASA/statin    3. T2DM: - Hgb A1C 9.1 (6/22). - Not a candidate for SGLT2i (GFR and hx of urosepsis).   4. HTN: - Bidil as above   5. AKI on CKD Stage 4: - SCr baseline 2.8-3.2 - Now steady 3.6-3.7 - D/w Nephrology. Will likely need HD in near future. Will order vein mapping here. Plan as above  6. Tremor, confusion, somnolence -2/2 hypercarbia, resolved with bipap therapy  7. Hyperkalemia: -has normalized with lasix/lokelma - follow daily BMET  8. Klebsiella UTI - on ceftriaxone per primary team   Length of Stay: [place], MD  04/28/2021, 11:45 AM  Advanced Heart Failure Team Pager [phone] (M-F; [place])  Please contact [facility] for night-coverage after hours (4p -7a ) and weekends on [email or web address]

## 2021-04-28 NOTE — Plan of Care (Signed)

## 2021-04-28 NOTE — Progress Notes (Signed)
Occupational Therapy Treatment Patient Details Name: Beverly Martin MRN: [id number] DOB: 09-Nov-1955 Today's Date: 04/28/2021    History of present illness 65 y.o. female [facility] ED on 7/12 with complaints of chest pain and something going on with her sugar. Of note, pt recently hospitalized from 6/2-6.3 for CHF with acute> chronic kidney disease. PMH: HTN, HLD, combined systolic and diastolic CHF last EF 0000000 in 12/2020, CAD s/p CABG, CKD stage IV, on nasal cannula oxygen of 2 L as needed, diabetes mellitus type 2, and anemia.   OT comments  Pt making steady progress towards OT goals this session. Session focus on functional mobilitly as precursor to higher level ADLs. Pt continues to present with decreased activity tolerance, impaired balance and generalized deconditioning. Pt currently requires set- up for UB ADLs and min guard assist for household distance functional mobility with RW. Pt on 2L [place] during session with SpO2 98% post session.Pt would continue to benefit from skilled occupational therapy while admitted and after d/c to address the below listed limitations in order to improve overall functional mobility and facilitate independence with BADL participation. DC plan remains appropriate, will follow acutely per POC.     Follow Up Recommendations  Home health OT;Supervision/Assistance - 24 hour    Equipment Recommendations  Toilet riser    Recommendations for Other Services      Precautions / Restrictions Precautions Precautions: Fall Restrictions Weight Bearing Restrictions: No       Mobility Bed Mobility Overal bed mobility: Needs Assistance Bed Mobility: Sit to Supine       Sit to supine: Supervision;HOB elevated   General bed mobility comments: supervision for safety    Transfers Overall transfer level: Needs assistance Equipment used: Rolling walker (2 wheeled) Transfers: Sit to/from Stand Sit to Stand: Supervision         General transfer  comment: supervision for safety    Balance Overall balance assessment: Needs assistance Sitting-balance support: Feet supported;No upper extremity supported Sitting balance-Leahy Scale: Good     Standing balance support: Bilateral upper extremity supported;During functional activity Standing balance-Leahy Scale: Poor Standing balance comment: pt reliant on BUE support for mobility, initially shaky in BUEs                           ADL either performed or assessed with clinical judgement   ADL Overall ADL's : Needs assistance/impaired                 Upper Body Dressing : Set up;Sitting Upper Body Dressing Details (indicate cue type and reason): to don posterior gown     Toilet Transfer: Min Marine scientist Details (indicate cue type and reason): simulated via functional mobility with Rw         Functional mobility during ADLs: Min guard;Rolling walker General ADL Comments: pt continues to present with balance impairments, decreased activity tolerance and generalized deconditioning     Vision       Perception     Praxis      Cognition Arousal/Alertness: Awake/alert Behavior During Therapy: WFL for tasks assessed/performed Overall Cognitive Status: Within Functional Limits for tasks assessed                                          Exercises     Shoulder Instructions       General  Comments pt on 2L [place] during session, SpO2 98% post session    Pertinent Vitals/ Pain       Pain Assessment: Faces Faces Pain Scale: Hurts a little bit Pain Location: mild chest tightness Pain Descriptors / Indicators: Tightness Pain Intervention(s): Limited activity within patient's tolerance;Monitored during session;Repositioned  Home Living                                          Prior Functioning/Environment              Frequency  Min 2X/week        Progress Toward Goals  OT  Goals(current goals can now be found in the care plan section)  Progress towards OT goals: Progressing toward goals  Acute Rehab OT Goals Patient Stated Goal: go home OT Goal Formulation: With patient Time For Goal Achievement: 04/26/2021 Potential to Achieve Goals: Good  Plan Discharge plan remains appropriate;Frequency remains appropriate    Co-evaluation                 AM-PAC OT "6 Clicks" Daily Activity     Outcome Measure   Help from another person eating meals?: None Help from another person taking care of personal grooming?: A Little Help from another person toileting, which includes using toliet, bedpan, or urinal?: A Little Help from another person bathing (including washing, rinsing, drying)?: A Little Help from another person to put on and taking off regular upper body clothing?: None Help from another person to put on and taking off regular lower body clothing?: A Little 6 Click Score: 20    End of Session Equipment Utilized During Treatment: Gait belt;Rolling walker;Oxygen;Other (comment) (2L [place])  OT Visit Diagnosis: Unsteadiness on feet (R26.81);Other abnormalities of gait and mobility (R26.89);Muscle weakness (generalized) (M62.81)   Activity Tolerance Patient tolerated treatment well   Patient Left in bed;with call bell/phone within reach;with bed alarm set   Nurse Communication Mobility status;Other (comment) (tolerance to session, gave pt graham crackers)        Time: [id number] OT Time Calculation (min): 16 min  Charges: OT General Charges $OT Visit: 1 Visit OT Treatments $Self Care/Home Management : 8-22 mins  Harley Alto., COTA/L Acute Rehabilitation Services [place] 04/28/2021, 2:45 PM

## 2021-04-29 ENCOUNTER — Encounter (HOSPITAL_COMMUNITY): Payer: BC Managed Care – PPO

## 2021-04-29 LAB — RENAL FUNCTION PANEL
Albumin: 3.3 g/dL — ABNORMAL LOW (ref 3.5–5.0)
Anion gap: 11 (ref 5–15)
BUN: 85 mg/dL — ABNORMAL HIGH (ref 8–23)
CO2: 43 mmol/L — ABNORMAL HIGH (ref 22–32)
Calcium: 8.8 mg/dL — ABNORMAL LOW (ref 8.9–10.3)
Chloride: 85 mmol/L — ABNORMAL LOW (ref 98–111)
Creatinine, Ser: 3.84 mg/dL — ABNORMAL HIGH (ref 0.44–1.00)
GFR, Estimated: 12 mL/min — ABNORMAL LOW (ref >60)
Glucose, Bld: 120 mg/dL — ABNORMAL HIGH (ref 70–99)
Phosphorus: 4.5 mg/dL (ref 2.5–4.6)
Potassium: 4.3 mmol/L (ref 3.5–5.1)
Sodium: 139 mmol/L (ref 135–145)

## 2021-04-29 LAB — CBC
HCT: 23.2 % — ABNORMAL LOW (ref 36.0–46.0)
Hemoglobin: 7.1 g/dL — ABNORMAL LOW (ref 12.0–15.0)
MCH: 31.3 pg (ref 26.0–34.0)
MCHC: 30.6 g/dL (ref 30.0–36.0)
MCV: 102.2 fL — ABNORMAL HIGH (ref 80.0–100.0)
Platelets: 122 10*3/uL — ABNORMAL LOW (ref 150–400)
RBC: 2.27 MIL/uL — ABNORMAL LOW (ref 3.87–5.11)
RDW: 14.4 % (ref 11.5–15.5)
WBC: 5.5 10*3/uL (ref 4.0–10.5)
nRBC: 0 % (ref 0.0–0.2)

## 2021-04-29 LAB — GLUCOSE, CAPILLARY
Glucose-Capillary: 116 mg/dL — ABNORMAL HIGH (ref 70–99)
Glucose-Capillary: 227 mg/dL — ABNORMAL HIGH (ref 70–99)
Glucose-Capillary: 267 mg/dL — ABNORMAL HIGH (ref 70–99)
Glucose-Capillary: 193 mg/dL — ABNORMAL HIGH (ref 70–99)

## 2021-04-29 MED ORDER — TORSEMIDE 20 MG PO TABS
20.0000 mg | ORAL_TABLET | Freq: Two times a day (BID) | ORAL | Status: DC
  Administered 2021-04-29 – 2021-04-30 (×2): 20 mg via ORAL
  Filled 2021-04-29 (×2): qty 1

## 2021-04-29 MED ORDER — DARBEPOETIN ALFA 100 MCG/0.5ML IJ SOSY
100.0000 ug | PREFILLED_SYRINGE | INTRAMUSCULAR | Status: DC
Start: 2021-05-02 — End: 2021-04-30

## 2021-04-29 NOTE — Plan of Care (Signed)

## 2021-04-29 NOTE — Progress Notes (Signed)
Patient had a brief episode of syncope as she was getting a bed bath. Patient could not talk to staff. RN noted patient's oxygen had been turned off. Patient roused and started talking to staff after getting her oxygen back on. Patient alert and oriented x 4. Resting in bed. Call light in reach. Beverly Martin

## 2021-04-29 NOTE — Progress Notes (Signed)
Physical Therapy Treatment Patient Details Name: Beverly Martin MRN: [id number] DOB: 1956/09/18 Today's Date: 04/29/2021    History of Present Illness 65 y.o. female presents to [facility] ED on 7/12 with complaints of chest pain and something going on with her sugar. Of note, pt recently hospitalized from 6/2-6.3 for CHF with acute> chronic kidney disease. PMH: HTN, HLD, combined systolic and diastolic CHF last EF 0000000 in 12/2020, CAD s/p CABG, CKD stage IV, on nasal cannula oxygen of 2 L as needed, diabetes mellitus type 2, and anemia.    PT Comments    Pt reports feeling weak and shakey today, states he "fell out" this morning and nurse endorses syncopal-type episode this am during bed bath. PT session focused on LE strengthening exercises to address pt deconditioning. Pt demonstrates muscle jerking during certain exercises today, mostly during long arc quads, and jerking has a cogwheel-like presentation. Pt ambulated 100+ ft in room with RW and close guard for safety only, no evidence of buckling or LE jerking during gait. PT to continue to follow acutely.     Follow Up Recommendations  Home health PT;Supervision for mobility/OOB     Equipment Recommendations  None recommended by PT    Recommendations for Other Services       Precautions / Restrictions Precautions Precautions: Fall Restrictions Weight Bearing Restrictions: No    Mobility  Bed Mobility               General bed mobility comments: up in chair    Transfers Overall transfer level: Needs assistance Equipment used: Rolling walker (2 wheeled) Transfers: Sit to/from Stand Sit to Stand: Min guard         General transfer comment: close guard for safety; STS x12 from EOB with cues for safe hand placement  Ambulation/Gait Ambulation/Gait assistance: Min guard Gait Distance (Feet): 100 Feet Assistive device: Rolling walker (2 wheeled) Gait Pattern/deviations: Step-through pattern;Decreased stride  length Gait velocity: reduced   General Gait Details: close guard for safety, pt bumping into objects with RW with attempt to correct after the fact. vss, pt reporting some lightheadedness during mobility but improved with rest.   Stairs             Wheelchair Mobility    Modified Rankin (Stroke Patients Only)       Balance Overall balance assessment: Needs assistance Sitting-balance support: Feet supported;No upper extremity supported Sitting balance-Leahy Scale: Good     Standing balance support: Bilateral upper extremity supported;During functional activity Standing balance-Leahy Scale: Poor Standing balance comment: pt reliant on BUE support for mobility                            Cognition Arousal/Alertness: Awake/alert Behavior During Therapy: WFL for tasks assessed/performed Overall Cognitive Status: Within Functional Limits for tasks assessed                                        Exercises General Exercises - Lower Extremity Long Arc Quad: AROM;Both;10 reps;Seated (against min PT resistance) Hip Flexion/Marching: AROM;Both;10 reps;Standing Other Exercises Other Exercises: standing hip extension, x10 bilat Other Exercises: sit to stands x10, emphasis on power up and slow eccentric lower    General Comments General comments (skin integrity, edema, etc.): 2LO2 throughout session      Pertinent Vitals/Pain Pain Assessment: No/denies pain Pain Intervention(s): Monitored during session  Home Living                      Prior Function            PT Goals (current goals can now be found in the care plan section) Acute Rehab PT Goals Patient Stated Goal: get stronger PT Goal Formulation: With patient Time For Goal Achievement: 05/09/2021 Potential to Achieve Goals: Good Progress towards PT goals: Progressing toward goals    Frequency    Min 3X/week      PT Plan Current plan remains appropriate     Co-evaluation              AM-PAC PT "6 Clicks" Mobility   Outcome Measure  Help needed turning from your back to your side while in a flat bed without using bedrails?: None Help needed moving from lying on your back to sitting on the side of a flat bed without using bedrails?: A Little Help needed moving to and from a bed to a chair (including a wheelchair)?: A Little Help needed standing up from a chair using your arms (e.g., wheelchair or bedside chair)?: A Little Help needed to walk in hospital room?: A Little Help needed climbing 3-5 steps with a railing? : A Lot 6 Click Score: 18    End of Session Equipment Utilized During Treatment: Gait belt Activity Tolerance: Patient limited by fatigue Patient left: in chair;with call bell/phone within reach;with chair alarm set Nurse Communication: Mobility status PT Visit Diagnosis: Unsteadiness on feet (R26.81);Other abnormalities of gait and mobility (R26.89);Muscle weakness (generalized) (M62.81)     Time: [id number] PT Time Calculation (min) (ACUTE ONLY): 18 min  Charges:  $Therapeutic Exercise: 8-22 mins                     Stacie Glaze, PT DPT Acute Rehabilitation Services Pager [phone]  Office [phone]    Roxine Caddy E Ruffin Pyo 04/29/2021, 12:47 PM

## 2021-04-29 NOTE — Progress Notes (Addendum)
[facility] Progress Note   Subjective:   This morning, patient reports feeling fatigued with decreased appetite. She denies any nausea/vomiting or any changes in her breathing. She notes ongoing "shakiness" in her legs, especially when she is walking.  During re-evaluation, patient had brief episode of syncope while getting bed bath. Intermittently unresponsive but did not lose pulse. Vital signs remained stable and patient's mental status improved with oxygen. She is alert oriented x4.   Objective Vitals:   04/29/21 0237 04/29/21 0539 04/29/21 1013 04/29/21 1029  BP:  133/68 118/66 139/73  Pulse:  81 97 91  Resp:  '18 20 19  '$ Temp:  98.8 F (37.1 C)  98 F (36.7 C)  TempSrc:  Oral  Oral  SpO2:  100% 100% 100%  Weight: 51.4 kg     Height:       Physical Exam General: chronically ill appearing female, no acute distress Heart: RRR, S1 and S2 present, no m/r/g Lungs: faint bibasilar crackles, remains on 2L O2 Abdomen: nondistended, soft, nontender, +BS Extremities: warm/dry, no lower extremity edema noted  Dialysis Access: None   Additional Objective Labs: Basic Metabolic Panel: Recent Labs  Lab 04/27/21 0320 04/28/21 0218 04/29/21 0224  NA 140 137 139  K 4.6 4.1 4.3  CL 87* 85* 85*  CO2 45* 47* 43*  GLUCOSE 142* 130* 120*  BUN 83* 82* 85*  CREATININE 3.73* 3.61* 3.84*  CALCIUM 8.7* 8.6* 8.8*  PHOS 4.7* 4.9* 4.5   Liver Function Tests: Recent Labs  Lab 04/27/21 0320 04/28/21 0218 04/29/21 0224  ALBUMIN 3.5 3.3* 3.3*   No results for input(s): LIPASE, AMYLASE in the last 168 hours. CBC: Recent Labs  Lab 04/23/21 2259 04/24/21 1241 04/25/21 0339 04/29/21 0224  WBC 7.9  --  5.6 5.5  NEUTROABS  --   --  4.8  --   HGB 8.6* 9.9* 7.9* 7.1*  HCT 28.6* 29.0* 26.2* 23.2*  MCV 105.5*  --  102.7* 102.2*  PLT 131*  --  113* 122*   Blood Culture    Component Value Date/Time   SDES URINE, CLEAN CATCH 04/26/2021 1404   SPECREQUEST  04/26/2021 1404     NONE Performed at [facility], [place] [address] [place], [place] [address]    CULT 80,000 COLONIES/mL KLEBSIELLA PNEUMONIAE (A) 04/26/2021 1404   REPTSTATUS 04/28/2021 FINAL 04/26/2021 1404    Cardiac Enzymes: No results for input(s): CKTOTAL, CKMB, CKMBINDEX, TROPONINI in the last 168 hours. CBG: Recent Labs  Lab 04/28/21 1143 04/28/21 1645 04/28/21 2121 04/29/21 0556 04/29/21 1010  GLUCAP 195* 262* 211* 116* 227*   Studies/Results: No results found. Medications:  sodium chloride     furosemide 120 mg (04/29/21 0831)    aspirin EC  81 mg Oral Daily   atorvastatin  40 mg Oral Daily   [START ON 05/02/2021] darbepoetin (ARANESP) injection - NON-DIALYSIS  100 mcg Subcutaneous Q Thu-1800   heparin  5,000 Units Subcutaneous Q8H   insulin aspart  0-6 Units Subcutaneous TID WC   insulin glargine  5 Units Subcutaneous Daily   isosorbide-hydrALAZINE  1 tablet Oral TID   sodium chloride flush  3 mL Intravenous Q12H    Dialysis Orders:  Assessment/Plan  Acute on chronic combined systolic and diastolic heart failure: suspected to be mixed ischemic and non-ischemic cardiomyopathy complicated by medication nonadherence. BNP>4500 on admission. IV diuresis has been augmented by metolazone during admission with last dosing of metolazone on 7/17. 950cc UOP over past 24 hours. She  is net -5L and down ~2.2kg down from admission. Patient appears euvolemic on exam with slight increase in her sCr this morning. She is on her home 2L of oxygen with faint bibasilar crackles noted on exam. Will adjust diuresis to home demadex. Continue BiDil 1 tab tid. Will continue to monitor volume status.  AKI on CKD IV: Baseline sCr 2-3; on admission 3.65>3.72>3.49>3.7>3.6> 3.84 this AM. Consistent with cardiorenal syndrome in setting of CKD secondary to hypertension and DM. Renal US 7/13 without obstruction consistent with chronic renal disease. Subnephrotic proteinuria secondary to diabetes mellitus; RAAS  blockade/SGLT2- use not possible with current renal function. Diuresis adjusted as above today. No indication for emergent RRT at this time; however, does appear to be heading in that direction. Plan for vein mapping today. Discussed with patient importance of close nephrology follow up outpatient. She expresses understanding. Will continue to trend renal function.  Metabolic alkalosis: Chronic, in setting of diuresis HCO3 42>41>43. Not a good candidate for diamox due to compromised renal function. No need for urgent RRT at this time. Will continue to trend.  Klebsiella UTI: s/p short course of Keflex. Management per primary team  Macrocytic anemia w/hx of normocytic anemia: Iron replete. Vitamin B12 and folate wnl. Aranesp weekly increased.  Type II DM: insulin dosing per primary team   Harvie Heck, MD Internal Medicine, PGY-3 04/29/21 11:31 AM Pager # [phone]  Patient seen and examined, agree with above note with above modifications.  Pt had episode of unresponsiveness during our evaluation that was short lived-  her oxygen was found to have been turned off-  when turned back on she improved.  Difficult to tell but not overwhelming uremic sxms-  no absolute indications for HD-  will modify diuretic to home dose.  Hope that she will not need HD as inpatient and can follow up with [facility] as OP  -  she says that she understands that follow up with [facility] is important.  Increase her ESA dose as well  Corliss Parish, MD 04/29/2021

## 2021-04-29 NOTE — Progress Notes (Signed)
PROGRESS NOTE    Beverly Martin  [id number] DOB: 1956-02-19 DOA: 04/23/2021 PCP: Billie Ruddy, MD    Brief Narrative:  Mrs. Blood was admitted to the hospital with the working diagnosis of acute decompensation of chronic systolic heart failure.   65 year old female past medical history for hypertension, dyslipidemia, heart failure, coronary artery disease status post bypass grafting, type 2 diabetes mellitus, anemia and chronic kidney disease stage IV.  She was asked to be brought to the hospital because concerns of her blood sugars.  Apparently patient had been noted to have lower extremity swelling and orthopnea.  On her initial physical examination blood pressure 139/73, heart rate 83, respiratory rate 16, oxygen saturation 100% on supplemental oxygen, her lungs had decreased breath sounds bilaterally with positive rales, heart S1-S2, present, rhythmic, abdomen soft nontender, positive lower extremity edema.   Sodium 137, potassium 6.0, chloride 89, bicarb 37, glucose 384, BUN 73, creatinine 3.65, BNP >4500, high sensitive troponin 29-58-222, white count 7.9, hemoglobin 8.6, hematocrit 28.6, platelets 131. SARS COVID-19 negative.   Urinalysis specific gravity 1.015, >50 white cells.   Chest radiograph with cardiomegaly, hilar vascular congestion, fluid in the right fissure, small pleural effusions bilaterally.   EKG 99 bpm, rightward axis, intervals, sinus rhythm, Q-wave V1-V2, ST depressions, T wave inversion on lead II, lead III, aVF, V5-V6   Patient placed on aggressive diuresis for volume overload.  Negative fluid balance has achieved and no transitioned to oral torsemide.  Significant reduction in GFR, plan for vein mapping on this admission in preparation for near future renal replacement therapy.    Assessment & Plan:   Principal Problem:   Acute on chronic combined systolic (congestive) and diastolic (congestive) heart failure (HCC) Active Problems:   Essential  hypertension   Type 2 diabetes mellitus with hyperlipidemia (HCC)   CAD (coronary artery disease)   Acute kidney injury superimposed on chronic kidney disease (HCC)   Elevated troponin   Hyperkalemia   Macrocytic anemia   Thrombocytopenia (HCC)     Acute on chronic systolic heart failure decompensation.   Urine out put over last 24 hrs is 950 ml. Patient having orthostatic symptoms, no dyspnea or chest pain. Continue to be very weak and deconditioned.    Transitioned to oral torsemide 20 mg daily. Continue with Bidil for after load reduction. Holding on RAAS inhibitors due to renal failure and risk of  hypotension.    Echocardiogram with LV systolic function with moderate reduction E 30 to 35%. LV with global hypokinesis, RV systolic function with moderate reduction. RVSP 54,0 mmHg.     2. AKI on Ckd stage IV, hyperkalemia. Metabolic alkalosis No significant edema, no hyperkalemia or acidosis.  Serum cr today is 3,84 with K at 4,3 and serum bicarbonate at 3,85 Continue diuresis with torsemide.  Vein mapping in preparation for possible renal replacement therapy in a near future.    Anemia of chronic renal disease continue with aranesp. Hgb 7,1 with hct at 23,1   3. T2DM. Glucose continue to be well controlled, fasting this am was 120 mg/dl Continue with insulin sliding scale for glucose cover and monitoring. Basal insulin with 5 units glargine.   4. Chronic anemia and thrombocytopenia.  Stable cell count.    5. UTI.  Culture with less than 100.00 CFU. Patient with no urinary symptoms. Will discontinue antibiotic therapy. Had fluconazole for vaginal yeast.     Status is: Inpatient  Remains inpatient appropriate because:Inpatient level of care appropriate due to severity of  illness  Dispo: The patient is from: Home              Anticipated d/c is to: Home              Patient currently is not medically stable to d/c.   Difficult to place patient No   DVT  prophylaxis: Heparin   Code Status:   full  Family Communication:   I spoke over the phone with the patient's husband about patient's condition, plan of care, prognosis and all questions were addressed.    Consultants:  Cardiology  Nephrology    Subjective: Patient is positive orthostatic symptoms, no nausea or vomiting, no chest pain. Cramps lower extremities.   Objective: Vitals:   04/29/21 0237 04/29/21 0539 04/29/21 1013 04/29/21 1029  BP:  133/68 118/66 139/73  Pulse:  81 97 91  Resp:  '18 20 19  '$ Temp:  98.8 F (37.1 C)  98 F (36.7 C)  TempSrc:  Oral  Oral  SpO2:  100% 100% 100%  Weight: 51.4 kg     Height:        Intake/Output Summary (Last 24 hours) at 04/29/2021 1203 Last data filed at 04/29/2021 1100 Gross per 24 hour  Intake 360 ml  Output 1500 ml  Net -1140 ml   Filed Weights   04/27/21 0315 04/28/21 0348 04/29/21 0237  Weight: 52.1 kg 51.6 kg 51.4 kg    Examination:   General: Not in pain or dyspnea, deconditioned  Neurology: Awake and alert, non focal  E ENT: no pallor, no icterus, oral mucosa moist Cardiovascular: positive external JVD. S1-S2 present, rhythmic, no gallops, rubs, or murmurs. No lower extremity edema. Pulmonary: positive breath sounds bilaterally, adequate air movement, no wheezing, rhonchi or rales. Gastrointestinal. Abdomen  soft and non tender Skin. No rashes Musculoskeletal: no joint deformities    Data Reviewed: I have personally reviewed following labs and imaging studies  CBC: Recent Labs  Lab 04/23/21 2259 04/24/21 1241 04/25/21 0339 04/29/21 0224  WBC 7.9  --  5.6 5.5  NEUTROABS  --   --  4.8  --   HGB 8.6* 9.9* 7.9* 7.1*  HCT 28.6* 29.0* 26.2* 23.2*  MCV 105.5*  --  102.7* 102.2*  PLT 131*  --  113* [id number]*   Basic Metabolic Panel: Recent Labs  Lab 04/25/21 1154 04/26/21 0346 04/27/21 0320 04/28/21 0218 04/29/21 0224  NA 140 141 140 137 139  K 5.0 4.5 4.6 4.1 4.3  CL 88* 90* 87* 85* 85*  CO2 42* 41* 45*  47* 43*  GLUCOSE 290* 106* 142* 130* 120*  BUN 81* 80* 83* 82* 85*  CREATININE 3.69* 3.49* 3.73* 3.61* 3.84*  CALCIUM 8.8* 9.0 8.7* 8.6* 8.8*  PHOS  --  4.5 4.7* 4.9* 4.5   GFR: Estimated Creatinine Clearance: 11.7 mL/min (A) (by C-G formula based on SCr of 3.84 mg/dL (H)). Liver Function Tests: Recent Labs  Lab 04/26/21 0346 04/27/21 0320 04/28/21 0218 04/29/21 0224  ALBUMIN 3.6 3.5 3.3* 3.3*   No results for input(s): LIPASE, AMYLASE in the last 168 hours. No results for input(s): AMMONIA in the last 168 hours. Coagulation Profile: No results for input(s): INR, PROTIME in the last 168 hours. Cardiac Enzymes: No results for input(s): CKTOTAL, CKMB, CKMBINDEX, TROPONINI in the last 168 hours. BNP (last 3 results) Recent Labs    12/06/20 0927  PROBNP >5000.0*   HbA1C: No results for input(s): HGBA1C in the last 72 hours. CBG: Recent Labs  Lab 04/28/21  1143 04/28/21 1645 04/28/21 2121 04/29/21 0556 04/29/21 1010  GLUCAP 195* 262* 211* 116* 227*   Lipid Profile: No results for input(s): CHOL, HDL, LDLCALC, TRIG, CHOLHDL, LDLDIRECT in the last 72 hours. Thyroid Function Tests: No results for input(s): TSH, T4TOTAL, FREET4, T3FREE, THYROIDAB in the last 72 hours. Anemia Panel: No results for input(s): VITAMINB12, FOLATE, FERRITIN, TIBC, IRON, RETICCTPCT in the last 72 hours.    Radiology Studies: I have reviewed all of the imaging during this hospital visit personally     Scheduled Meds:  aspirin EC  81 mg Oral Daily   atorvastatin  40 mg Oral Daily   [START ON 05/02/2021] darbepoetin (ARANESP) injection - NON-DIALYSIS  100 mcg Subcutaneous Q Thu-1800   heparin  5,000 Units Subcutaneous Q8H   insulin aspart  0-6 Units Subcutaneous TID WC   insulin glargine  5 Units Subcutaneous Daily   isosorbide-hydrALAZINE  1 tablet Oral TID   sodium chloride flush  3 mL Intravenous Q12H   torsemide  20 mg Oral BID   Continuous Infusions:  sodium chloride       LOS:  5 days        Viviano Bir Gerome Apley, MD

## 2021-04-29 NOTE — Plan of Care (Signed)

## 2021-04-29 NOTE — Progress Notes (Addendum)
Advanced Heart Failure Team Rounding Note   Primary Physician:Dr. Volanda Napoleon Primary Cardiologist:  Dr. Haroldine Laws  Reason for Consultation: Acute on chronic Combined Systolic and Diastolic CHF  HPI:    Received metolazone 5 in addition to lasix 120 BID yesterday.  Weight stable at 113lbs compared with yesterday about 5lbs down since admission.  She is fatigued with poor appetite.  Denies nausea, chest pain, dyspnea.  Cr 3.6>3.8   Objective:    Vital Signs:   Temp:  [97.8 F (36.6 C)-98.8 F (37.1 C)] 98.8 F (37.1 C) (07/18 0539) Pulse Rate:  [81-89] 81 (07/18 0539) Resp:  [18-20] 18 (07/18 0539) BP: (121-137)/(63-68) 133/68 (07/18 0539) SpO2:  [88 %-100 %] 100 % (07/18 0539) Weight:  [51.4 kg] 51.4 kg (07/18 0237) Last BM Date: 04/23/21  Weight change: Filed Weights   04/27/21 0315 04/28/21 0348 04/29/21 0237  Weight: 52.1 kg 51.6 kg 51.4 kg    Intake/Output:   Intake/Output Summary (Last 24 hours) at 04/29/2021 0809 Last data filed at 04/29/2021 0237 Gross per 24 hour  Intake 240 ml  Output 950 ml  Net -710 ml      Physical Exam    General:  Weak appearing. No resp difficulty HEENT: normal Neck: supple JVP 7-8 Carotids 2+ bilat; no bruits. No lymphadenopathy or thryomegaly appreciated. Cor: PMI nondisplaced. Regular rate & rhythm. No rubs, gallops or murmurs. Lungs: basilar crackles  Abdomen: soft, nontender, nondistended. No hepatosplenomegaly. No bruits or masses. Good bowel sounds. Extremities: no cyanosis, clubbing, rash, no LE edema Neuro: alert & orientedx3, cranial nerves grossly intact. moves all 4 extremities w/o difficulty. Affect pleasant   Telemetry   NSR 80-90s Personally reviewed   Labs   Basic Metabolic Panel: Recent Labs  Lab 04/25/21 1154 04/26/21 0346 04/27/21 0320 04/28/21 0218 04/29/21 0224  NA 140 141 140 137 139  K 5.0 4.5 4.6 4.1 4.3  CL 88* 90* 87* 85* 85*  CO2 42* 41* 45* 47* 43*  GLUCOSE 290* 106* 142* 130* 120*   BUN 81* 80* 83* 82* 85*  CREATININE 3.69* 3.49* 3.73* 3.61* 3.84*  CALCIUM 8.8* 9.0 8.7* 8.6* 8.8*  PHOS  --  4.5 4.7* 4.9* 4.5    Liver Function Tests: Recent Labs  Lab 04/26/21 0346 04/27/21 0320 04/28/21 0218 04/29/21 0224  ALBUMIN 3.6 3.5 3.3* 3.3*   No results for input(s): LIPASE, AMYLASE in the last 168 hours. No results for input(s): AMMONIA in the last 168 hours.  CBC: Recent Labs  Lab 04/23/21 2259 04/24/21 1241 04/25/21 0339 04/29/21 0224  WBC 7.9  --  5.6 5.5  NEUTROABS  --   --  4.8  --   HGB 8.6* 9.9* 7.9* 7.1*  HCT 28.6* 29.0* 26.2* 23.2*  MCV 105.5*  --  102.7* 102.2*  PLT 131*  --  113* 122*    Cardiac Enzymes: No results for input(s): CKTOTAL, CKMB, CKMBINDEX, TROPONINI in the last 168 hours.  BNP: BNP (last 3 results) Recent Labs    03/14/21 0934 04/10/21 1129 04/24/21 1223  BNP >4,500.0* >4,500.0* >4,500.0*    ProBNP (last 3 results) Recent Labs    12/06/20 0927  PROBNP >5000.0*     CBG: Recent Labs  Lab 04/28/21 0547 04/28/21 1143 04/28/21 1645 04/28/21 2121 04/29/21 0556  GLUCAP 121* 195* 262* 211* 116*    Coagulation Studies: No results for input(s): LABPROT, INR in the last 72 hours.   Imaging   No results found.   Medications:  Current Medications:  aspirin EC  81 mg Oral Daily   atorvastatin  40 mg Oral Daily   darbepoetin (ARANESP) injection - NON-DIALYSIS  60 mcg Subcutaneous Q Thu-1800   heparin  5,000 Units Subcutaneous Q8H   insulin aspart  0-6 Units Subcutaneous TID WC   insulin glargine  5 Units Subcutaneous Daily   isosorbide-hydrALAZINE  1 tablet Oral TID   sodium chloride flush  3 mL Intravenous Q12H    Infusions:  sodium chloride     furosemide 120 mg (04/28/21 1709)    Assessment/Plan   1. Acute on Chronic Combined Systolic and Diastolic CHF: - Likely mixed ischemic and NICM, poorly controlled HTN, poor medication adherence may also have progressive CAD but unable to do coronary  angio due to CKD 4 - Echo 12/2020 EF down from 45-50% in 2019 to 20-25%, RV moderately reduced - 04/25/21 ECHO EF  30-35%, G2DD, Moderately reduced RV function, IVC 1.8cm with <50% respiratory variability.   - NYHA III, discussed with nephrology diuretic therapy.  They would like to go ahead and switch her to oral torsemide today.  This may be about as dry as we can safely get her with diuretics - Slow diuresis, no indication for emergent dialysis currently, vein mapping today in anticipation of likely forthcoming dialysis  - GDMT and ischemic eval limited by renal function.  - continue bidil 1 tab TID - not candidate for advanced HF therapies. Has refused Palliative services in past.  Overall concerning clinical trajectory  2. CAD s/p CABG 2005 - No s/s angina - unable to do coronary angio due to CKD 4 - continue ASA/statin    3. T2DM: - Hgb A1C 9.1 (6/22). - Not a candidate for SGLT2i (GFR and hx of urosepsis).   4. HTN: - Bidil as above   5. AKI on CKD Stage 4: - SCr baseline 2.8-3.2 - 3.6->3.8 - D/w Nephrology. Will likely need HD in near future. Vein mapping scheduled for today  6. Tremor, confusion, somnolence -2/2 hypercarbia, resolved with bipap therapy  7. Hyperkalemia: -has normalized with lasix/lokelma -follow daily BMET  8. Klebsiella UTI     Vulvovaginal candidiasis - rec'd short course keflex and '150mg'$  of diflucan, management per primary  9. Macrocytic Anemia:  -hgb drifting down since admission 7.1 today, no bleeding, B12 and folate normal, iron studies normal -nephrology giving aranesp -transfuse Hgb <7  Length of Stay: [place], MD  04/29/2021, 8:09 AM  Advanced Heart Failure Team Pager [phone] (M-F; [place])  Please contact [facility] for night-coverage after hours (4p -7a ) and weekends on [email or web address]   Patient seen and examined with the above-signed Advanced Practice Provider and/or Housestaff. I personally reviewed laboratory data,  imaging studies and relevant notes. I independently examined the patient and formulated the important aspects of the plan. I have edited the note to reflect any of my changes or salient points. I have personally discussed the plan with the patient and/or family.  She remains weak. + DOE.   General:  Weak appearing. No resp difficulty HEENT: normal Neck: supple. JVP 9-10  Carotids 2+ bilat; no bruits. No lymphadenopathy or thryomegaly appreciated. Cor: PMI nondisplaced. Regular rate & rhythm. 2/6 SEM LSB Lungs: clear Abdomen: soft, nontender, nondistended. No hepatosplenomegaly. No bruits or masses. Good bowel sounds. Extremities: no cyanosis, clubbing, rash, edema Neuro: alert & orientedx3, cranial nerves grossly intact. moves all 4 extremities w/o difficulty. Affect pleasant  Now on po diuretics. Weight stable. Neck  veins still up. I am ok with switching to oral torsemide. Will likely need intermittent metolazone.   I suspect she will need HD soon but I am not sure her heart will tolerate. I discussed this with her again at length today but she has very limited insight into the severity of her condition.   Glori Bickers, MD  5:55 PM

## 2021-04-30 ENCOUNTER — Inpatient Hospital Stay (HOSPITAL_COMMUNITY): Payer: BC Managed Care – PPO

## 2021-04-30 DIAGNOSIS — I739 Peripheral vascular disease, unspecified: Secondary | ICD-10-CM

## 2021-04-30 DIAGNOSIS — R778 Other specified abnormalities of plasma proteins: Secondary | ICD-10-CM

## 2021-04-30 DIAGNOSIS — Z9889 Other specified postprocedural states: Secondary | ICD-10-CM

## 2021-04-30 LAB — GLUCOSE, CAPILLARY
Glucose-Capillary: 141 mg/dL — ABNORMAL HIGH (ref 70–99)
Glucose-Capillary: 184 mg/dL — ABNORMAL HIGH (ref 70–99)
Glucose-Capillary: 194 mg/dL — ABNORMAL HIGH (ref 70–99)

## 2021-04-30 LAB — RENAL FUNCTION PANEL
Albumin: 3.4 g/dL — ABNORMAL LOW (ref 3.5–5.0)
Anion gap: 7 (ref 5–15)
BUN: 85 mg/dL — ABNORMAL HIGH (ref 8–23)
CO2: 47 mmol/L — ABNORMAL HIGH (ref 22–32)
Calcium: 9 mg/dL (ref 8.9–10.3)
Chloride: 84 mmol/L — ABNORMAL LOW (ref 98–111)
Creatinine, Ser: 3.59 mg/dL — ABNORMAL HIGH (ref 0.44–1.00)
GFR, Estimated: 13 mL/min — ABNORMAL LOW (ref >60)
Glucose, Bld: 138 mg/dL — ABNORMAL HIGH (ref 70–99)
Phosphorus: 4.5 mg/dL (ref 2.5–4.6)
Potassium: 4 mmol/L (ref 3.5–5.1)
Sodium: 138 mmol/L (ref 135–145)

## 2021-04-30 LAB — HEMOGLOBIN AND HEMATOCRIT, BLOOD
HCT: 26.7 % — ABNORMAL LOW (ref 36.0–46.0)
Hemoglobin: 8.3 g/dL — ABNORMAL LOW (ref 12.0–15.0)

## 2021-04-30 MED ORDER — ISOSORB DINITRATE-HYDRALAZINE 20-37.5 MG PO TABS: 1.0000 | ORAL_TABLET | Freq: Three times a day (TID) | ORAL | 0 refills | Status: AC

## 2021-04-30 MED ORDER — POTASSIUM CHLORIDE CRYS ER 20 MEQ PO TBCR: 40.0000 meq | EXTENDED_RELEASE_TABLET | ORAL | Status: DC

## 2021-04-30 MED ORDER — TORSEMIDE 20 MG PO TABS
20.0000 mg | ORAL_TABLET | Freq: Once | ORAL | Status: AC
  Administered 2021-04-30: 20 mg via ORAL
  Filled 2021-04-30: qty 1

## 2021-04-30 MED ORDER — POTASSIUM CHLORIDE CRYS ER 20 MEQ PO TBCR: 40.0000 meq | EXTENDED_RELEASE_TABLET | ORAL | 0 refills | Status: AC

## 2021-04-30 MED ORDER — TORSEMIDE 20 MG PO TABS
20.0000 mg | ORAL_TABLET | Freq: Every evening | ORAL | Status: DC
  Administered 2021-04-30: 20 mg via ORAL
  Filled 2021-04-30: qty 1

## 2021-04-30 MED ORDER — METOLAZONE 2.5 MG PO TABS: 2.5000 mg | ORAL_TABLET | ORAL | Status: DC

## 2021-04-30 MED ORDER — METOLAZONE 2.5 MG PO TABS: 2.5000 mg | ORAL_TABLET | ORAL | 0 refills | Status: AC

## 2021-04-30 MED ORDER — TORSEMIDE 40 MG PO TABS: 40.0000 mg | ORAL_TABLET | Freq: Every morning | ORAL | 0 refills | Status: AC

## 2021-04-30 MED ORDER — TORSEMIDE 20 MG PO TABS: 40.0000 mg | ORAL_TABLET | Freq: Every morning | ORAL | Status: DC

## 2021-04-30 MED ORDER — TORSEMIDE 20 MG PO TABS: 20.0000 mg | ORAL_TABLET | Freq: Every evening | ORAL | 0 refills | Status: AC

## 2021-04-30 NOTE — Discharge Summary (Addendum)
Physician Discharge Summary  [name] [id number] DOB: [birth year] DOA: 04/23/2021  PCP: Billie Ruddy, MD  Admit date: 04/23/2021 Discharge date: 04/30/2021  Admitted From: Home  Disposition:  Home   Recommendations for Outpatient Follow-up and new medication changes:  Follow up with Dr. Volanda Napoleon in 7 to 10 days.  Follow up with nephrology and cardiology as outpatient.  Continue diuresis with torsemide and twice a week metolazone KCL supplementation with metolazone.  Follow up renal function in 7 days.   Home Health: yes   Equipment/Devices: na    Discharge Condition: stable  CODE STATUS: full  Diet recommendation:  heart healthy and renal prudent.   Brief/Interim Summary: Beverly Martin was admitted to the hospital with the working diagnosis of acute decompensation of chronic systolic heart failure.   65 year old female past medical history for hypertension, dyslipidemia, heart failure, coronary artery disease status post bypass grafting, type 2 diabetes mellitus, anemia and chronic kidney disease stage IV.  She was asked to be brought to the hospital because concerns of her blood sugars.  Apparently patient had been noted to have lower extremity swelling and orthopnea.  On her initial physical examination blood pressure 139/73, heart rate 83, respiratory rate 16, oxygen saturation 100% on supplemental oxygen, her lungs had decreased breath sounds bilaterally with positive rales, heart S1-S2, present, rhythmic, abdomen soft nontender, positive lower extremity edema.   Sodium 137, potassium 6.0, chloride 89, bicarb 37, glucose 384, BUN 73, creatinine 3.65, BNP >4500, high sensitive troponin 29-58-222, white count 7.9, hemoglobin 8.6, hematocrit 28.6, platelets 131. SARS COVID-19 negative.   Urinalysis specific gravity 1.015, >50 white cells.   Chest radiograph with cardiomegaly, hilar vascular congestion, fluid in the right fissure, small pleural effusions bilaterally.   EKG 99  bpm, rightward axis, intervals, sinus rhythm, Q-wave V1-V2, ST depressions, T wave inversion on lead II, lead III, aVF, V5-V6   Patient placed on aggressive diuresis for volume overload.   Negative fluid balance has achieved and no transitioned to oral torsemide.   Significant reduction in GFR, plan for vein mapping on this admission in preparation for near future renal replacement therapy.  Acute on chronic systolic heart failure decompensation, pulmonary hypertension class II, acute on chronic cor pulmonale.    Patient was admitted to the cardiac telemetry unit, she received aggressive diuresis with intravenous furosemide.  Negative fluid balance was achieved, -5382 mL with improvement of her symptoms.  Further work-up with echocardiography showed LV systolic function with moderate reduction down to 30 to 35%, left ventricle global hypokinesis.  Moderate reduction in RV systolic function.  RVSP 54.  Patient will continue diuresis with torsemide and biweekly metolazone.  For afterload reduction  continue with BiDil. Outpatient follow-up with cardiology.  2.  Acute kidney injury on chronic disease stage V, hyperkalemia, metabolic alkalosis. Patient with significant reduction in GFR.  She underwent diuresis with intravenous furosemide. She had vein mapping in preparation for possible renal replacement therapy in the near future.  Anemia of chronic kidney disease.   Iron panel with serum iron 56, TIBC 172, transferrin saturation 33, ferritin 242.  Patient received Darbapoetin. KCL supplementation with metolazone.  Vein mapping performed, positive superficial vein thrombosis at the left cephalic vein.     3.  Type 2 diabetes mellitus.  Patient was placed on insulin sliding scale for glucose coverage monitoring.  Basal insulin with 5 units of glargine.  Her fasting glucose at discharge is 130.  4.  Anemia of chronic renal disease, thrombocytopenia. Follow-up  with the cell count before  discharge, if hemoglobin less than 7 may need PRBC transfusion.  5.  Urine tract infection.  Positive pyuria, urine culture less than 100,000 colony-forming units of Klebsiella.  No urinary symptoms. Patient received 2 days of cephalexin. She had fluconazole for vaginal yeast.  Discharge Diagnoses:  Principal Problem:   Acute on chronic combined systolic (congestive) and diastolic (congestive) heart failure (HCC) Active Problems:   Essential hypertension   Type 2 diabetes mellitus with hyperlipidemia (HCC)   CAD (coronary artery disease)   Acute kidney injury superimposed on chronic kidney disease (HCC)   Elevated troponin   Hyperkalemia   Macrocytic anemia   Thrombocytopenia (HCC)    Discharge Instructions   Allergies as of 04/30/2021   No Known Allergies      Medication List     STOP taking these medications    hydrALAZINE 25 MG tablet Commonly known as: APRESOLINE   insulin glargine 100 unit/mL Sopn Commonly known as: LANTUS   Insulin Pen Needle 32G X 4 MM Misc   isosorbide mononitrate 60 MG 24 hr tablet Commonly known as: IMDUR       TAKE these medications    aspirin 81 MG EC tablet Take 1 tablet (81 mg total) by mouth daily. Swallow whole.   atorvastatin 40 MG tablet Commonly known as: LIPITOR Take 1 tablet (40 mg total) by mouth daily.   calcitRIOL 0.25 MCG capsule Commonly known as: ROCALTROL TAKE 1 CAPSULE BY MOUTH EVERY DAY   isosorbide-hydrALAZINE 20-37.5 MG tablet Commonly known as: BIDIL Take 1 tablet by mouth 3 (three) times daily.   metolazone 2.5 MG tablet Commonly known as: ZAROXOLYN Take 1 tablet (2.5 mg total) by mouth 2 (two) times a week. Take one tablet on Monday and one tablet on Friday Start taking on: May 02, 2021   potassium chloride SA 20 MEQ tablet Commonly known as: KLOR-CON Take 2 tablets (40 mEq total) by mouth 2 (two) times a week. Take 2 tablets on Monday and 2 tablets on Friday (with motolazone) Start taking on:  May 02, 2021   torsemide 20 MG tablet Commonly known as: DEMADEX Take 1 tablet (20 mg total) by mouth every evening. What changed: You were already taking a medication with the same name, and this prescription was added. Make sure you understand how and when to take each.   Torsemide 40 MG Tabs Take 40 mg by mouth every morning. Start taking on: May 01, 2021 What changed:  medication strength how much to take when to take this        No Known Allergies  Consultations: Cardiology Nephrology    Procedures/Studies: DG Chest 2 View  Result Date: 04/23/2021 CLINICAL DATA:  Chest pain EXAM: CHEST - 2 VIEW COMPARISON:  Choose 12/02/2018 FINDINGS: Prior median sternotomy and CABG. Similar cardiomegaly. Aortic atherosclerosis. Similar size of the small bilateral pleural effusions with adjacent atelectasis. Mild diffuse interstitial opacities. The visualized skeletal structures is unchanged. IMPRESSION: Cardiomegaly with interstitial edema and stable small bilateral pleural effusions. Electronically Signed   By: Dahlia Bailiff MD   On: 04/23/2021 23:32   US RENAL  Result Date: 04/24/2021 CLINICAL DATA:  65 year old female with acute renal insufficiency. EXAM: RENAL / URINARY TRACT ULTRASOUND COMPLETE COMPARISON:  None. FINDINGS: Right Kidney: Renal measurements: 8.8 x 3.7 x 3.8 cm = volume: 64 mL. There is diffuse increased renal parenchymal echogenicity. No hydronephrosis or shadowing stone. Left Kidney: Renal measurements: 9.3 x 4.6 x 3.6 cm = volume:  81 mL. Diffuse increased renal parenchymal echogenicity. No hydronephrosis or shadowing stone. Bladder: Appears normal for degree of bladder distention. Other: Small ascites. IMPRESSION: 1. Echogenic kidneys may represent medical renal disease. Clinical correlation is recommended. No hydronephrosis or shadowing stone. 2. Small ascites. Electronically Signed   By: Anner Crete M.D.   On: 04/24/2021 20:41   ECHOCARDIOGRAM  COMPLETE  Result Date: 04/25/2021    ECHOCARDIOGRAM REPORT   Patient Name:   Beverly Martin Date of Exam: 04/25/2021 Medical Rec #:  [id number]      Height:       64.0 in Accession #:    [id number]     Weight:       118.1 lb Date of Birth:  1955-11-11      BSA:          1.564 m Patient Age:    14 years       BP:           140/73 mmHg Patient Gender: F              HR:           98 bpm. Exam Location:  Inpatient Procedure: 2D Echo, Cardiac Doppler and Color Doppler Indications:    CHF-Acute Systolic [id number]  History:        Patient has prior history of Echocardiogram examinations, most                 recent 01/03/2021. CHF, CAD; Risk Factors:Diabetes, Hypertension                 and Dyslipidemia.  Sonographer:    Bernadene Person RDCS Referring Phys: [id number] [place]  1. Left ventricular ejection fraction, by estimation, is 30 to 35%. The left ventricle has moderately decreased function. The left ventricle demonstrates global hypokinesis. There is mild left ventricular hypertrophy. Left ventricular diastolic parameters are consistent with Grade II diastolic dysfunction (pseudonormalization).  2. Right ventricular systolic function is moderately reduced. The right ventricular size is mildly enlarged. There is moderately elevated pulmonary artery systolic pressure. The estimated right ventricular systolic pressure is 0000000 mmHg.  3. Left atrial size was moderately dilated.  4. The mitral valve is normal in structure. Trivial mitral valve regurgitation. No evidence of mitral stenosis.  5. The aortic valve is tricuspid. Aortic valve regurgitation is not visualized. No aortic stenosis is present.  6. The inferior vena cava is normal in size with <50% respiratory variability, suggesting right atrial pressure of 8 mmHg. FINDINGS  Left Ventricle: Left ventricular ejection fraction, by estimation, is 30 to 35%. The left ventricle has moderately decreased function. The left ventricle demonstrates global  hypokinesis. The left ventricular internal cavity size was normal in size. There is mild left ventricular hypertrophy. Left ventricular diastolic parameters are consistent with Grade II diastolic dysfunction (pseudonormalization). Right Ventricle: The right ventricular size is mildly enlarged. No increase in right ventricular wall thickness. Right ventricular systolic function is moderately reduced. There is moderately elevated pulmonary artery systolic pressure. The tricuspid regurgitant velocity is 3.39 m/s, and with an assumed right atrial pressure of 8 mmHg, the estimated right ventricular systolic pressure is 0000000 mmHg. Left Atrium: Left atrial size was moderately dilated. Right Atrium: Right atrial size was normal in size. Pericardium: There is no evidence of pericardial effusion. Mitral Valve: The mitral valve is normal in structure. Trivial mitral valve regurgitation. No evidence of mitral valve stenosis. Tricuspid Valve: The tricuspid valve is normal in structure. Tricuspid  valve regurgitation is mild. Aortic Valve: The aortic valve is tricuspid. Aortic valve regurgitation is not visualized. No aortic stenosis is present. Pulmonic Valve: The pulmonic valve was normal in structure. Pulmonic valve regurgitation is mild. Aorta: The aortic root is normal in size and structure. Venous: The inferior vena cava is normal in size with less than 50% respiratory variability, suggesting right atrial pressure of 8 mmHg. IAS/Shunts: No atrial level shunt detected by color flow Doppler.  LEFT VENTRICLE PLAX 2D LVIDd:         4.80 cm      Diastology LVIDs:         3.90 cm      LV e' medial:    3.26 cm/s LV PW:         1.30 cm      LV E/e' medial:  31.0 LV IVS:        0.90 cm      LV e' lateral:   4.39 cm/s LVOT diam:     1.70 cm      LV E/e' lateral: 23.0 LV SV:         43 LV SV Index:   27 LVOT Area:     2.27 cm  LV Volumes (MOD) LV vol d, MOD A2C: 106.0 ml LV vol d, MOD A4C: 97.9 ml LV vol s, MOD A2C: 69.7 ml LV vol s,  MOD A4C: 67.4 ml LV SV MOD A2C:     36.3 ml LV SV MOD A4C:     97.9 ml LV SV MOD BP:      35.7 ml RIGHT VENTRICLE RV S prime:     6.47 cm/s TAPSE (M-mode): 1.2 cm LEFT ATRIUM             Index       RIGHT ATRIUM           Index LA diam:        3.70 cm 2.37 cm/m  RA Area:     12.80 cm LA Vol (A2C):   68.1 ml 43.54 ml/m RA Volume:   30.00 ml  19.18 ml/m LA Vol (A4C):   68.4 ml 43.74 ml/m LA Biplane Vol: 69.9 ml 44.69 ml/m  AORTIC VALVE LVOT Vmax:   96.80 cm/s LVOT Vmean:  70.800 cm/s LVOT VTI:    0.188 m  AORTA Ao Root diam: 2.60 cm Ao Asc diam:  2.80 cm MITRAL VALVE                TRICUSPID VALVE MV Area (PHT): 3.99 cm     TR Peak grad:   46.0 mmHg MV Decel Time: 190 msec     TR Vmax:        339.00 cm/s MV E velocity: 101.00 cm/s MV A velocity: 92.60 cm/s   SHUNTS MV E/A ratio:  1.09         Systemic VTI:  0.19 m                             Systemic Diam: 1.70 cm Loralie Champagne MD Electronically signed by Loralie Champagne MD Signature Date/Time: 04/25/2021/5:10:32 PM    Final        Subjective: Patient is feeling better, dyspnea is stable and no lower extremity edema,   Discharge Exam: Vitals:   04/29/21 2021 04/30/21 0359  BP: 126/65 135/64  Pulse: 78 80  Resp: 17 19  Temp:  97.8 F (36.6 C)  SpO2: 100% 100%   Vitals:   04/29/21 1029 04/29/21 2021 04/30/21 0351 04/30/21 0359  BP: 139/73 126/65  135/64  Pulse: 91 78  80  Resp: '19 17  19  '$ Temp: 98 F (36.7 C) 97.8 F (36.6 C)  97.8 F (36.6 C)  TempSrc: Oral Oral  Oral  SpO2: 100% 100%  100%  Weight:   50.8 kg   Height:        General: Not in pain or dyspnea.  Neurology: Awake and alert, non focal  E ENT: mild pallor, no icterus, oral mucosa moist Cardiovascular:  S1-S2 present, rhythmic, no gallops, rubs, or murmurs. No lower extremity edema. Pulmonary: positive breath sounds bilaterally, adequate air movement, no wheezing, rhonchi or rales. Gastrointestinal. Abdomen soft and non tender Skin. No rashes Musculoskeletal: no  joint deformities   The results of significant diagnostics from this hospitalization (including imaging, microbiology, ancillary and laboratory) are listed below for reference.     Microbiology: Recent Results (from the past 240 hour(s))  Resp Panel by RT-PCR (Flu A&B, Covid) Nasopharyngeal Swab     Status: None   Collection Time: 04/24/21  2:31 PM   Specimen: Nasopharyngeal Swab; Nasopharyngeal(NP) swabs in vial transport medium  Result Value Ref Range Status   SARS Coronavirus 2 by RT PCR NEGATIVE NEGATIVE Final    Comment: (NOTE) SARS-CoV-2 target nucleic acids are NOT DETECTED.  The SARS-CoV-2 RNA is generally detectable in upper respiratory specimens during the acute phase of infection. The lowest concentration of SARS-CoV-2 viral copies this assay can detect is 138 copies/mL. A negative result does not preclude SARS-Cov-2 infection and should not be used as the sole basis for treatment or other patient management decisions. A negative result may occur with  improper specimen collection/handling, submission of specimen other than nasopharyngeal swab, presence of viral mutation(s) within the areas targeted by this assay, and inadequate number of viral copies(<138 copies/mL). A negative result must be combined with clinical observations, patient history, and epidemiological information. The expected result is Negative.  Fact Sheet for Patients:  EntrepreneurPulse.com.au  Fact Sheet for Healthcare Providers:  IncredibleEmployment.be  This test is no t yet approved or cleared by the [place] FDA and  has been authorized for detection and/or diagnosis of SARS-CoV-2 by FDA under an Emergency Use Authorization (EUA). This EUA will remain  in effect (meaning this test can be used) for the duration of the COVID-19 declaration under Section 564(b)(1) of the Act, 21 U.S.C.section 360bbb-3(b)(1), unless the authorization is terminated  or  revoked sooner.       Influenza A by PCR NEGATIVE NEGATIVE Final   Influenza B by PCR NEGATIVE NEGATIVE Final    Comment: (NOTE) The Xpert Xpress SARS-CoV-2/FLU/RSV plus assay is intended as an aid in the diagnosis of influenza from Nasopharyngeal swab specimens and should not be used as a sole basis for treatment. Nasal washings and aspirates are unacceptable for Xpert Xpress SARS-CoV-2/FLU/RSV testing.  Fact Sheet for Patients: EntrepreneurPulse.com.au  Fact Sheet for Healthcare Providers: IncredibleEmployment.be  This test is not yet approved or cleared by the [place] FDA and has been authorized for detection and/or diagnosis of SARS-CoV-2 by FDA under an Emergency Use Authorization (EUA). This EUA will remain in effect (meaning this test can be used) for the duration of the COVID-19 declaration under Section 564(b)(1) of the Act, 21 U.S.C. section 360bbb-3(b)(1), unless the authorization is terminated or revoked.  Performed at [facility], [place] [address] [place],  [address]  Urine Culture     Status: Abnormal   Collection Time: 04/26/21  2:04 PM   Specimen: Urine, Clean Catch  Result Value Ref Range Status   Specimen Description URINE, CLEAN CATCH  Final   Special Requests   Final    NONE Performed at [facility], [address] [place], [place] [address]    Culture 80,000 COLONIES/mL KLEBSIELLA PNEUMONIAE (A)  Final   Report Status 04/28/2021 FINAL  Final   Organism ID, Bacteria KLEBSIELLA PNEUMONIAE (A)  Final      Susceptibility   Klebsiella pneumoniae - MIC*    AMPICILLIN RESISTANT Resistant     CEFAZOLIN <=4 SENSITIVE Sensitive     CEFEPIME <=0.12 SENSITIVE Sensitive     CEFTRIAXONE <=0.25 SENSITIVE Sensitive     CIPROFLOXACIN <=0.25 SENSITIVE Sensitive     GENTAMICIN <=1 SENSITIVE Sensitive     IMIPENEM 0.5 SENSITIVE Sensitive     NITROFURANTOIN 64 INTERMEDIATE Intermediate     TRIMETH/SULFA  <=20 SENSITIVE Sensitive     AMPICILLIN/SULBACTAM <=2 SENSITIVE Sensitive     PIP/TAZO <=4 SENSITIVE Sensitive     * 80,000 COLONIES/mL KLEBSIELLA PNEUMONIAE     Labs: BNP (last 3 results) Recent Labs    03/14/21 0934 04/10/21 1129 04/24/21 1223  BNP >4,500.0* >4,500.0* [id number]*   Basic Metabolic Panel: Recent Labs  Lab 04/26/21 0346 04/27/21 0320 04/28/21 0218 04/29/21 0224 04/30/21 0203  NA 141 140 137 139 138  K 4.5 4.6 4.1 4.3 4.0  CL 90* 87* 85* 85* 84*  CO2 41* 45* 47* 43* 47*  GLUCOSE 106* 142* 130* 120* 138*  BUN 80* 83* 82* 85* 85*  CREATININE 3.49* 3.73* 3.61* 3.84* 3.59*  CALCIUM 9.0 8.7* 8.6* 8.8* 9.0  PHOS 4.5 4.7* 4.9* 4.5 4.5   Liver Function Tests: Recent Labs  Lab 04/26/21 0346 04/27/21 0320 04/28/21 0218 04/29/21 0224 04/30/21 0203  ALBUMIN 3.6 3.5 3.3* 3.3* 3.4*   No results for input(s): LIPASE, AMYLASE in the last 168 hours. No results for input(s): AMMONIA in the last 168 hours. CBC: Recent Labs  Lab 04/23/21 2259 04/24/21 1241 04/25/21 0339 04/29/21 0224  WBC 7.9  --  5.6 5.5  NEUTROABS  --   --  4.8  --   HGB 8.6* 9.9* 7.9* 7.1*  HCT 28.6* 29.0* 26.2* 23.2*  MCV 105.5*  --  102.7* 102.2*  PLT 131*  --  113* 122*   Cardiac Enzymes: No results for input(s): CKTOTAL, CKMB, CKMBINDEX, TROPONINI in the last 168 hours. BNP: Invalid input(s): POCBNP CBG: Recent Labs  Lab 04/29/21 1010 04/29/21 1534 04/29/21 2137 04/30/21 0608 04/30/21 0833  GLUCAP 227* 267* 193* 141* 184*   D-Dimer No results for input(s): DDIMER in the last 72 hours. Hgb A1c No results for input(s): HGBA1C in the last 72 hours. Lipid Profile No results for input(s): CHOL, HDL, LDLCALC, TRIG, CHOLHDL, LDLDIRECT in the last 72 hours. Thyroid function studies No results for input(s): TSH, T4TOTAL, T3FREE, THYROIDAB in the last 72 hours.  Invalid input(s): FREET3 Anemia work up No results for input(s): VITAMINB12, FOLATE, FERRITIN, TIBC, IRON,  RETICCTPCT in the last 72 hours. Urinalysis    Component Value Date/Time   COLORURINE YELLOW 04/25/2021 1211   APPEARANCEUR CLOUDY (A) 04/25/2021 1211   LABSPEC 1.015 04/25/2021 1211   PHURINE 5.5 04/25/2021 1211   GLUCOSEU 100 (A) 04/25/2021 1211   HGBUR TRACE (A) 04/25/2021 1211   BILIRUBINUR NEGATIVE 04/25/2021 1211   KETONESUR NEGATIVE 04/25/2021 1211   PROTEINUR  30 (A) 04/25/2021 1211   UROBILINOGEN 0.2 10/20/2017 1115   NITRITE NEGATIVE 04/25/2021 1211   LEUKOCYTESUR MODERATE (A) 04/25/2021 1211   Sepsis Labs Invalid input(s): PROCALCITONIN,  WBC,  LACTICIDVEN Microbiology Recent Results (from the past 240 hour(s))  Resp Panel by RT-PCR (Flu A&B, Covid) Nasopharyngeal Swab     Status: None   Collection Time: 04/24/21  2:31 PM   Specimen: Nasopharyngeal Swab; Nasopharyngeal(NP) swabs in vial transport medium  Result Value Ref Range Status   SARS Coronavirus 2 by RT PCR NEGATIVE NEGATIVE Final    Comment: (NOTE) SARS-CoV-2 target nucleic acids are NOT DETECTED.  The SARS-CoV-2 RNA is generally detectable in upper respiratory specimens during the acute phase of infection. The lowest concentration of SARS-CoV-2 viral copies this assay can detect is 138 copies/mL. A negative result does not preclude SARS-Cov-2 infection and should not be used as the sole basis for treatment or other patient management decisions. A negative result may occur with  improper specimen collection/handling, submission of specimen other than nasopharyngeal swab, presence of viral mutation(s) within the areas targeted by this assay, and inadequate number of viral copies(<138 copies/mL). A negative result must be combined with clinical observations, patient history, and epidemiological information. The expected result is Negative.  Fact Sheet for Patients:  EntrepreneurPulse.com.au  Fact Sheet for Healthcare Providers:  IncredibleEmployment.be  This test is no  t yet approved or cleared by the [place] FDA and  has been authorized for detection and/or diagnosis of SARS-CoV-2 by FDA under an Emergency Use Authorization (EUA). This EUA will remain  in effect (meaning this test can be used) for the duration of the COVID-19 declaration under Section 564(b)(1) of the Act, 21 U.S.C.section 360bbb-3(b)(1), unless the authorization is terminated  or revoked sooner.       Influenza A by PCR NEGATIVE NEGATIVE Final   Influenza B by PCR NEGATIVE NEGATIVE Final    Comment: (NOTE) The Xpert Xpress SARS-CoV-2/FLU/RSV plus assay is intended as an aid in the diagnosis of influenza from Nasopharyngeal swab specimens and should not be used as a sole basis for treatment. Nasal washings and aspirates are unacceptable for Xpert Xpress SARS-CoV-2/FLU/RSV testing.  Fact Sheet for Patients: EntrepreneurPulse.com.au  Fact Sheet for Healthcare Providers: IncredibleEmployment.be  This test is not yet approved or cleared by the [place] FDA and has been authorized for detection and/or diagnosis of SARS-CoV-2 by FDA under an Emergency Use Authorization (EUA). This EUA will remain in effect (meaning this test can be used) for the duration of the COVID-19 declaration under Section 564(b)(1) of the Act, 21 U.S.C. section 360bbb-3(b)(1), unless the authorization is terminated or revoked.  Performed at [facility], [place] [address], [place], [place] [address]   Urine Culture     Status: Abnormal   Collection Time: 04/26/21  2:04 PM   Specimen: Urine, Clean Catch  Result Value Ref Range Status   Specimen Description URINE, CLEAN CATCH  Final   Special Requests   Final    NONE Performed at [facility], [place] [address] [place], [place] [address]    Culture 80,000 COLONIES/mL KLEBSIELLA PNEUMONIAE (A)  Final   Report Status 04/28/2021 FINAL  Final   Organism ID, Bacteria KLEBSIELLA PNEUMONIAE (A)  Final       Susceptibility   Klebsiella pneumoniae - MIC*    AMPICILLIN RESISTANT Resistant     CEFAZOLIN <=4 SENSITIVE Sensitive     CEFEPIME <=0.12 SENSITIVE Sensitive     CEFTRIAXONE <=0.25  SENSITIVE Sensitive     CIPROFLOXACIN <=0.25 SENSITIVE Sensitive     GENTAMICIN <=1 SENSITIVE Sensitive     IMIPENEM 0.5 SENSITIVE Sensitive     NITROFURANTOIN 64 INTERMEDIATE Intermediate     TRIMETH/SULFA <=20 SENSITIVE Sensitive     AMPICILLIN/SULBACTAM <=2 SENSITIVE Sensitive     PIP/TAZO <=4 SENSITIVE Sensitive     * 80,000 COLONIES/mL KLEBSIELLA PNEUMONIAE     Time coordinating discharge: 45 minutes  SIGNED:   Tawni Millers, MD  [facility] 04/30/2021, 10:55 AM

## 2021-04-30 NOTE — Progress Notes (Signed)
Patient alert and oriented x4, VSS. Denies pain iv and tele removed. D/c instruction explain to the patient  and her husband, all questions answered. Patient and husband verbalized understanding. Pt. D/c per order

## 2021-04-30 NOTE — Progress Notes (Addendum)
[facility] Progress Note   Subjective:    No acute overnight events.  This morning, Ms Campbell reports that she is feeling slightly better. She does endorse some ongoing weakness but denies any dyspnea on exertion or at rest. No further syncope-like episodes noted. On re-evaluation, patient is sitting up in bedside chair. Reports that she is ready to go home. Stressed importance of following up with nephrology as outpatient. She expresses understanding.   Objective Vitals:   04/29/21 1029 04/29/21 2021 04/30/21 0351 04/30/21 0359  BP: 139/73 126/65  135/64  Pulse: 91 78  80  Resp: '19 17  19  '$ Temp: 98 F (36.7 C) 97.8 F (36.6 C)  97.8 F (36.6 C)  TempSrc: Oral Oral  Oral  SpO2: 100% 100%  100%  Weight:   50.8 kg   Height:       Physical Exam General: chronically ill appearing female, no acute distress Heart: RRR, S1 and S2 present, no m/r/g Lungs: clear to auscultation bilaterally; remains on 2L O2 Abdomen: nondistended, soft, nontender, +BS Extremities: warm/dry, trace lower extremity edema noted  Dialysis Access: None   Additional Objective Labs: Basic Metabolic Panel: Recent Labs  Lab 04/28/21 0218 04/29/21 0224 04/30/21 0203  NA 137 139 138  K 4.1 4.3 4.0  CL 85* 85* 84*  CO2 47* 43* 47*  GLUCOSE 130* 120* 138*  BUN 82* 85* 85*  CREATININE 3.61* 3.84* 3.59*  CALCIUM 8.6* 8.8* 9.0  PHOS 4.9* 4.5 4.5   Liver Function Tests: Recent Labs  Lab 04/28/21 0218 04/29/21 0224 04/30/21 0203  ALBUMIN 3.3* 3.3* 3.4*   No results for input(s): LIPASE, AMYLASE in the last 168 hours. CBC: Recent Labs  Lab 04/23/21 2259 04/24/21 1241 04/25/21 0339 04/29/21 0224  WBC 7.9  --  5.6 5.5  NEUTROABS  --   --  4.8  --   HGB 8.6* 9.9* 7.9* 7.1*  HCT 28.6* 29.0* 26.2* 23.2*  MCV 105.5*  --  102.7* 102.2*  PLT 131*  --  113* 122*   Blood Culture    Component Value Date/Time   SDES URINE, CLEAN CATCH 04/26/2021 1404   SPECREQUEST  04/26/2021 1404     NONE Performed at [facility], [place] [address], [place], [place] [address]    CULT 80,000 COLONIES/mL KLEBSIELLA PNEUMONIAE (A) 04/26/2021 1404   REPTSTATUS 04/28/2021 FINAL 04/26/2021 1404    Cardiac Enzymes: No results for input(s): CKTOTAL, CKMB, CKMBINDEX, TROPONINI in the last 168 hours. CBG: Recent Labs  Lab 04/29/21 1010 04/29/21 1534 04/29/21 2137 04/30/21 0608 04/30/21 0833  GLUCAP 227* 267* 193* 141* 184*   Studies/Results: No results found. Medications:  sodium chloride      aspirin EC  81 mg Oral Daily   atorvastatin  40 mg Oral Daily   [START ON 05/02/2021] darbepoetin (ARANESP) injection - NON-DIALYSIS  100 mcg Subcutaneous Q Thu-1800   heparin  5,000 Units Subcutaneous Q8H   insulin aspart  0-6 Units Subcutaneous TID WC   insulin glargine  5 Units Subcutaneous Daily   isosorbide-hydrALAZINE  1 tablet Oral TID   sodium chloride flush  3 mL Intravenous Q12H   torsemide  20 mg Oral BID    Dialysis Orders:  Assessment/Plan  Acute on chronic combined systolic and diastolic heart failure: suspected to be mixed ischemic and non-ischemic cardiomyopathy complicated by medication nonadherence. BNP>4500 on admission. IV diuresis has been augmented by metolazone during admission with last dosing of metolazone on 7/17. 1.25L UOP over  past 24 hours. She is net -4.7L and down ~2.8kg down from admission. Patient appears euvolemic on exam. Her home torsemide dosing increased to Torsemide '40mg'$  in AM, '20mg'$  in PM with metolazone added twice weekly. Continue BiDil 1 tab tid. Will continue to monitor volume status.  AKI on CKD IV: Baseline sCr 2-3; on admission 3.65>3.72>3.49>3.7>3.6> 3.84>3.59 this AM. Consistent with cardiorenal syndrome in setting of CKD secondary to hypertension and DM. Renal US 7/13 without obstruction consistent with chronic renal disease. Subnephrotic proteinuria secondary to diabetes mellitus; RAAS blockade/SGLT2- use not possible with current renal  function. Had vein mapping yesterday. No indication for emergent RRT at this time; however, does appear to be headed towards requiring HD. Patient's diuretics adjusted as above. Discussed with patient the importance of close nephrology follow up outpatient. She expresses understanding. Patient to be scheduled for outpatient appointment; will follow up renal function at that visit. Metabolic alkalosis: Chronic, in setting of diuresis HCO3 42>41>43>47. Not a good candidate for diamox due to compromised renal function. No need for urgent RRT at this time. Will continue to trend.  Klebsiella UTI: s/p short course of Keflex. Management per primary team  Macrocytic anemia w/hx of normocytic anemia: Iron replete. Vitamin B12 and folate wnl. Will likely need to continue Aranesp as outpatient.  Type II DM: insulin dosing per primary team   Harvie Heck, MD Internal Medicine, PGY-3 04/30/21 9:29 AM Pager # [phone]  Patient seen and examined, agree with above note with above modifications. Pt feels well today.  Making good urine and crt stable in the mid 3's-  possibly her new baseline.  Is not uremic.  I think is reasonable to attempt an OP follow up with her.  Have her scheduled with Dr. Justin Mend on 8/16 at [facility]-  be there at 2:30 Corliss Parish, MD 04/30/2021

## 2021-04-30 NOTE — Progress Notes (Signed)
Physical Therapy Treatment Patient Details Name: Beverly Martin MRN: [id number] DOB: 1956-09-14 Today's Date: 04/30/2021    History of Present Illness 65 y.o. female presents to [facility] ED on 7/12 with complaints of chest pain and something going on with her sugar. Of note, pt recently hospitalized from 6/2-6.3 for CHF with acute> chronic kidney disease. PMH: HTN, HLD, combined systolic and diastolic CHF last EF 0000000 in 12/2020, CAD s/p CABG, CKD stage IV, on nasal cannula oxygen of 2 L as needed, diabetes mellitus type 2, and anemia.    PT Comments    Pt ambulated 72' with RW and 2L O2, HR 86, no loss of balance, distance limited by lightheadedness. Pt also reported lightheadedness while in sitting prior to activity. Performed seated BLE strengthening exercises.     Follow Up Recommendations  Home health PT;Supervision for mobility/OOB     Equipment Recommendations  None recommended by PT    Recommendations for Other Services       Precautions / Restrictions Precautions Precautions: Fall Precaution Comments: on 2L O2 at baseline Restrictions Weight Bearing Restrictions: No    Mobility  Bed Mobility               General bed mobility comments: up in chair    Transfers Overall transfer level: Needs assistance Equipment used: Rolling walker (2 wheeled) Transfers: Sit to/from Stand Sit to Stand: Supervision         General transfer comment: close guard for safety; STS x12 from EOB with cues for safe hand placement  Ambulation/Gait Ambulation/Gait assistance: Min guard Gait Distance (Feet): 85 Feet Assistive device: Rolling walker (2 wheeled) Gait Pattern/deviations: Step-through pattern;Decreased stride length Gait velocity: reduced   General Gait Details: distance limited by lightheadedness, HR 86 walking, pt ambulated with 2L O2 [place], no loss of balance   Stairs             Wheelchair Mobility    Modified Rankin (Stroke Patients Only)        Balance Overall balance assessment: Needs assistance Sitting-balance support: Feet supported;No upper extremity supported Sitting balance-Leahy Scale: Good     Standing balance support: Bilateral upper extremity supported;During functional activity Standing balance-Leahy Scale: Poor Standing balance comment: pt reliant on BUE support for mobility                            Cognition Arousal/Alertness: Awake/alert Behavior During Therapy: WFL for tasks assessed/performed Overall Cognitive Status: Within Functional Limits for tasks assessed                                        Exercises General Exercises - Lower Extremity Ankle Circles/Pumps: AROM;Both;15 reps;Seated Long Arc Quad: AROM;Both;10 reps;Seated Hip Flexion/Marching: AROM;Both;10 reps;Seated    General Comments        Pertinent Vitals/Pain Pain Assessment: No/denies pain    Home Living                      Prior Function            PT Goals (current goals can now be found in the care plan section) Acute Rehab PT Goals Patient Stated Goal: get stronger PT Goal Formulation: With patient Time For Goal Achievement: 05/03/2021 Potential to Achieve Goals: Good Progress towards PT goals: Progressing toward goals    Frequency  Min 3X/week      PT Plan Current plan remains appropriate    Co-evaluation              AM-PAC PT "6 Clicks" Mobility   Outcome Measure  Help needed turning from your back to your side while in a flat bed without using bedrails?: None Help needed moving from lying on your back to sitting on the side of a flat bed without using bedrails?: A Little Help needed moving to and from a bed to a chair (including a wheelchair)?: A Little Help needed standing up from a chair using your arms (e.g., wheelchair or bedside chair)?: A Little Help needed to walk in hospital room?: A Little Help needed climbing 3-5 steps with a railing? : A Little 6  Click Score: 19    End of Session Equipment Utilized During Treatment: Gait belt Activity Tolerance: Patient limited by fatigue Patient left: in chair;with call bell/phone within reach;with family/visitor present Nurse Communication: Mobility status PT Visit Diagnosis: Unsteadiness on feet (R26.81);Other abnormalities of gait and mobility (R26.89);Muscle weakness (generalized) (M62.81)     Time: [id number] PT Time Calculation (min) (ACUTE ONLY): 16 min  Charges:  $Gait Training: 8-22 mins                     Blondell Reveal Kistler PT 04/30/2021  Acute Rehabilitation Services Pager [phone] Office [phone]

## 2021-04-30 NOTE — Progress Notes (Addendum)
Advanced Heart Failure Team Rounding Note   Primary Physician:Dr. Banks Primary Cardiologist:  Dr. Haroldine Laws  Reason for Consultation: Acute on chronic Combined Systolic and Diastolic CHF  HPI:    Lasix '120mg'$  in am and torsemide '20mg'$  in pm yesterday.  Weight down an additional pound to 112.  Cr 3.8>3.6.  Feels okay, no dyspnea at rest and with moving slowly around flat surfaces.  Syncopal episode yesterday during bath, no evident cause noted on telemetry.  Denies chest pain, orthopnea or PND.    Objective:    Vital Signs:   Temp:  [97.8 F (36.6 C)-98 F (36.7 C)] 97.8 F (36.6 C) (07/19 0359) Pulse Rate:  [78-97] 80 (07/19 0359) Resp:  [17-20] 19 (07/19 0359) BP: (118-139)/(64-73) 135/64 (07/19 0359) SpO2:  [100 %] 100 % (07/19 0359) Weight:  [50.8 kg] 50.8 kg (07/19 0351) Last BM Date: 04/23/21  Weight change: Filed Weights   04/28/21 0348 04/29/21 0237 04/30/21 0351  Weight: 51.6 kg 51.4 kg 50.8 kg    Intake/Output:   Intake/Output Summary (Last 24 hours) at 04/30/2021 0717 Last data filed at 04/30/2021 0400 Gross per 24 hour  Intake 560 ml  Output 1250 ml  Net -690 ml      Physical Exam    General:  Weak appearing, cachectic. No resp difficulty HEENT: normal Neck: supple JVP 8-9 Carotids 2+ bilat; no bruits. No lymphadenopathy or thryomegaly appreciated. Cor: PMI nondisplaced. Regular rate & rhythm 2/6 systolic murmur. No rubs, gallops  Lungs: basilar crackles  Abdomen: soft, nontender, nondistended. No hepatosplenomegaly. No bruits or masses. Good bowel sounds. Extremities: no cyanosis, clubbing, rash, no LE edema Neuro: alert & orientedx3, cranial nerves grossly intact. moves all 4 extremities w/o difficulty. Affect pleasant  REDS Clip 41%  Telemetry   NSR 80-90s Personally reviewed   Labs   Basic Metabolic Panel: Recent Labs  Lab 04/26/21 0346 04/27/21 0320 04/28/21 0218 04/29/21 0224 04/30/21 0203  NA 141 140 137 139 138  K 4.5 4.6  4.1 4.3 4.0  CL 90* 87* 85* 85* 84*  CO2 41* 45* 47* 43* 47*  GLUCOSE 106* 142* 130* 120* 138*  BUN 80* 83* 82* 85* 85*  CREATININE 3.49* 3.73* 3.61* 3.84* 3.59*  CALCIUM 9.0 8.7* 8.6* 8.8* 9.0  PHOS 4.5 4.7* 4.9* 4.5 4.5    Liver Function Tests: Recent Labs  Lab 04/26/21 0346 04/27/21 0320 04/28/21 0218 04/29/21 0224 04/30/21 0203  ALBUMIN 3.6 3.5 3.3* 3.3* 3.4*   No results for input(s): LIPASE, AMYLASE in the last 168 hours. No results for input(s): AMMONIA in the last 168 hours.  CBC: Recent Labs  Lab 04/23/21 2259 04/24/21 1241 04/25/21 0339 04/29/21 0224  WBC 7.9  --  5.6 5.5  NEUTROABS  --   --  4.8  --   HGB 8.6* 9.9* 7.9* 7.1*  HCT 28.6* 29.0* 26.2* 23.2*  MCV 105.5*  --  102.7* 102.2*  PLT 131*  --  113* 122*    Cardiac Enzymes: No results for input(s): CKTOTAL, CKMB, CKMBINDEX, TROPONINI in the last 168 hours.  BNP: BNP (last 3 results) Recent Labs    03/14/21 0934 04/10/21 1129 04/24/21 1223  BNP >4,500.0* >4,500.0* >4,500.0*    ProBNP (last 3 results) Recent Labs    12/06/20 0927  PROBNP >5000.0*     CBG: Recent Labs  Lab 04/29/21 0556 04/29/21 1010 04/29/21 1534 04/29/21 2137 04/30/21 0608  GLUCAP 116* 227* 267* 193* 141*    Coagulation Studies: No results  for input(s): LABPROT, INR in the last 72 hours.   Imaging   No results found.   Medications:     Current Medications:  aspirin EC  81 mg Oral Daily   atorvastatin  40 mg Oral Daily   [START ON 05/02/2021] darbepoetin (ARANESP) injection - NON-DIALYSIS  100 mcg Subcutaneous Q Thu-1800   heparin  5,000 Units Subcutaneous Q8H   insulin aspart  0-6 Units Subcutaneous TID WC   insulin glargine  5 Units Subcutaneous Daily   isosorbide-hydrALAZINE  1 tablet Oral TID   sodium chloride flush  3 mL Intravenous Q12H   torsemide  20 mg Oral BID    Infusions:  sodium chloride      Assessment/Plan   1. Acute on Chronic Combined Systolic and Diastolic CHF: - Likely  mixed ischemic and NICM, poorly controlled HTN, poor medication adherence may also have progressive CAD but unable to do coronary angio due to CKD 4 - Echo 12/2020 EF down from 45-50% in 2019 to 20-25%, RV moderately reduced - 04/25/21 ECHO EF  30-35%, G2DD, Moderately reduced RV function, IVC 1.8cm with <50% respiratory variability.   - NYHA III, REDS 41%, switched to oral torsemide yesterday after discussing with nephrology.  -Would increase torsemide to '40mg'$  am, 20 mg pm and add metolazone 2.'5mg'$  twice weekly Monday and Friday with 36mq potassium supplementation.  - Slow diuresis, no indication for emergent dialysis currently, vein mapping did not happen yesterday but is scheduled for this am in anticipation of likely forthcoming dialysis  - GDMT and ischemic eval limited by renal function.  - continue bidil 1 tab TID - not candidate for advanced HF therapies. Has refused Palliative services in past.  Overall concerning clinical trajectory  2. CAD s/p CABG 2005 - No s/s angina - unable to do coronary angio due to CKD 4 - continue ASA/statin    3. T2DM: - Hgb A1C 9.1 (6/22). - Not a candidate for SGLT2i (GFR and hx of urosepsis).   4. HTN: - Bidil as above   5. AKI on CKD Stage 4: - SCr baseline 2.8-3.2 - 3.8>3.6 - D/w Nephrology. Will likely need HD in near future. Vein mapping scheduled for today  6. Tremor, confusion, somnolence -2/2 hypercarbia, resolved with bipap therapy  7. Hyperkalemia: -has normalized with lasix/lokelma -follow daily BMET  8. Klebsiella UTI     Vulvovaginal candidiasis - rec'd short course keflex and '150mg'$  of diflucan, management per primary  9. Macrocytic Anemia:  -hgb drifting down since admission, no bleeding, B12 and folate normal, iron studies normal -h and h repeat ordered -nephrology increased aranesp dose -transfuse Hgb <7  Length of Stay: 6[place] MD  04/30/2021, 7:17 AM  Advanced Heart Failure Team Pager [phone](M-F;  [place]  Please contact [facility] for night-coverage after hours (4p -7a ) and weekends on [email or web address]   She has a follow up appointment already scheduled in our clinic on the 29th.    Discharge cardiac medications: Asa 81 Atorvastatin '40mg'$  Bidil 1 tab TID Increase torsemide to '40mg'$  q am, 20 mg q pm and add metolazone 2.5 twice weekly Monday and Friday with 46m potassium supplementation to be given with metolazone.    WiKatherine RoanMD  7:17 AM  Patient seen and examined with the above-signed Advanced Practice Provider and/or Housestaff. I personally reviewed laboratory data, imaging studies and relevant notes. I independently examined the patient and formulated the important aspects of the plan. I have edited  the note to reflect any of my changes or salient points. I have personally discussed the plan with the patient and/or family.  Weight down another pound. Feels ok. No SOB, orthopnea or PND.   General:  Sitting up No resp difficulty HEENT: normal Neck: supple. JVP 6-7  Carotids 2+ bilat; no bruits. No lymphadenopathy or thryomegaly appreciated. Cor: PMI nondisplaced. Regular rate & rhythm. No rubs, gallops or murmurs. Lungs: clear Abdomen: soft, nontender, nondistended. No hepatosplenomegaly. No bruits or masses. Good bowel sounds. Extremities: no cyanosis, clubbing, rash, tr edema Neuro: alert & orientedx3, cranial nerves grossly intact. moves all 4 extremities w/o difficulty. Affect pleasant  She is probably about as good as we can get her with but ReDS still elevated at 41% (normal 20-35%). SCr stable at 3.6.   Ok to d/c with torsemide 40/20 and metolazone 2x/week.   I suspect she will need to consider initiation of HD soon (if she can tolerate). Stressed need to f/u with [facility] as outpatient. (She missed previous appointment). She has very limited insight into the severity of her disease.   Glori Bickers, MD  12:21 PM

## 2021-04-30 NOTE — TOC Initial Note (Addendum)
Transition of Care [facility]) - Initial/Assessment Note    Patient Details  Name: Beverly Martin MRN: [id number] Date of Birth: 1955/12/25  Transition of Care [facility]) CM/SW Contact:    Beverly Mayo, RN Phone Number: 04/30/2021, 3:50 PM  Clinical Narrative:                 NCM spoke with patient at bedside with her spouse.  She has a scale at home to weigh her self daily , she does not have a bp cuff. She states she does try to eat a low sodium diet. She does not put much salt on her food.  NCM offered choice for [facility] services.  Her spouse says he is ok with [place].  NCM made referral to [facility] with [facility].  Awaiting call back. Patient is on home oxygen with [employer] 2 liters, spouse has a tank in the car.  Beverly Martin states he can take referral.  Soc will begin 24 to 48 hrs post dc.  Expected Discharge Plan: [place] Barriers to Discharge: No Barriers Identified   Patient Goals and CMS Choice Patient states their goals for this hospitalization and ongoing recovery are:: return home and get well so she can move around [facility] Medicare.gov Compare Post Acute Care list provided to:: Patient Represenative (must comment) Choice offered to / list presented to : Spouse  Expected Discharge Plan and Services Expected Discharge Plan: [place]   Discharge Planning Services: CM Consult Post Acute Care Choice: [place] arrangements for the past 2 months: Single Family Home Expected Discharge Date: 04/30/21                 DME Agency: NA       HH Arranged: RN, PT, OT, Disease Management [place] Agency: [place] Date [facility] Agency Contacted: 04/30/21 Time HH Agency Contacted: 62 Representative spoke with at [place]  Prior Living Arrangements/Services Living arrangements for the past 2 months: [place] Lives with:: Spouse Patient language and need for interpreter reviewed:: Yes Do you feel safe going back to the place where you  live?: Yes      Need for Family Participation in Patient Care: Yes (Comment) Care giver support system in place?: Yes (comment) Current home services: DME (has walker, oxygen 2 liters ([employer]),) Criminal Activity/Legal Involvement Pertinent to Current Situation/Hospitalization: No - Comment as needed  Activities of Daily Living      Permission Sought/Granted                  Emotional Assessment Appearance:: Appears stated age Attitude/Demeanor/Rapport: Engaged Affect (typically observed): Appropriate Orientation: : Oriented to Self, Oriented to Place, Oriented to  Time, Oriented to Situation Alcohol / Substance Use: Not Applicable Psych Involvement: No (comment)  Admission diagnosis:  Hyperkalemia [E87.5] Hyperglycemia [R73.9] AKI (acute kidney injury) ([place]) [N17.9] Acute on chronic combined systolic (congestive) and diastolic (congestive) heart failure ([place]) [I50.43] Acute on chronic congestive heart failure, unspecified heart failure type [facility] [I50.9] Patient Active Problem List   Diagnosis Date Noted   Acute on chronic combined systolic (congestive) and diastolic (congestive) heart failure ([place]) 04/24/2021   Hyperkalemia 04/24/2021   Macrocytic anemia 04/24/2021   Thrombocytopenia ([place]) 04/24/2021   Hyperglycemia    Acute on chronic combined systolic and diastolic CHF, NYHA class 3 ([place]) [id number]   Chronic systolic heart failure ([place]) 01/16/2021   CKD (chronic kidney disease) stage 4, GFR 15-29 ml/min ([place]) 01/04/2021  CHF (congestive heart failure) ([place]) [id number]   Acute systolic heart failure ([place]) 06/29/2020   Acute CHF (congestive heart failure) ([place]) 06/10/2020   Anasarca 06/10/2020   Dyslipidemia 06/10/2020   Positive blood culture 10/21/2017   UTI (urinary tract infection) 10/20/2017   Sepsis ([place]) 10/20/2017   Acute kidney injury superimposed on chronic kidney disease ([place]) 10/20/2017   Elevated troponin 10/20/2017   Essential hypertension    Type  2 diabetes mellitus with hyperlipidemia (HCC)    CAD (coronary artery disease)    PCP:  Billie Ruddy, MD Pharmacy:   [id number] [id number]  [id number]: [phone]: [phone] [facility] [address]: [phone]: [phone]    Social Determinants of Health (SDOH) Interventions    Readmission Risk Interventions Readmission Risk Prevention Plan 04/30/2021 03/15/2021 03/15/2021  Transportation Screening Complete Complete Complete  PCP or Specialist Appt within 5-7 Days - Complete Complete  Home Care Screening - Complete Complete  Medication Review (RN CM) - Complete Complete  Medication Review ([place] Complete - -  PCP or Specialist appointment within 3-5 days of discharge Complete - -  [place] Home Care Consult Complete - -  SW Recovery Care/Counseling Consult Complete - -  Palliative Care Screening Not Applicable - -  [place] Applicable - -  Some recent data might be hidden

## 2021-04-30 NOTE — TOC Transition Note (Signed)
Transition of Care [facility]) - CM/SW Discharge Note   Patient Details  Name: Beverly Martin MRN: [id number] Date of Birth: 10-Feb-1956  Transition of Care [facility]) CM/SW Contact:  Zenon Mayo, RN Phone Number: 04/30/2021, 4:00 PM   Clinical Narrative:    NCM spoke with patient at bedside with her spouse.  She has a scale at home to weigh her self daily , she does not have a bp cuff. She states she does try to eat a low sodium diet. She does not put much salt on her food.  NCM offered choice for [employer] [facility] [employer] services.  Her spouse says he is ok with [place].  NCM made referral to [facility] with [facility].  Awaiting call back. Patient is on home oxygen with [employer] 2 liters, spouse has a tank in the car.  Tommi Rumps states he can take referral.  Soc will begin 24 to 48 hrs post dc.     Final next level of care: [place] Barriers to Discharge: No Barriers Identified   Patient Goals and CMS Choice Patient states their goals for this hospitalization and ongoing recovery are:: return home CMS Medicare.gov Compare Post Acute Care list provided to:: Patient Represenative (must comment) Choice offered to / list presented to : Spouse  Discharge Placement                       Discharge Plan and Services   Discharge Planning Services: CM Consult Post Acute Care Choice: Home Health            DME Agency: NA       HH Arranged: RN, PT, OT, Disease Management [place] Agency: [place] Date [place]: 04/30/21 Time [place]: [id number] Representative spoke with at [place]: [place] ([place]) Interventions     Readmission Risk Interventions Readmission Risk Prevention Plan 04/30/2021 03/15/2021 03/15/2021  Transportation Screening Complete Complete Complete  PCP or Specialist Appt within 5-7 Days - Complete Complete  Home Care Screening - Complete Complete  Medication Review (RN CM) - Complete Complete  Medication Review Human resources officer) Complete - -  PCP or Specialist appointment within 3-5 days of discharge Complete - -  [place] or Home Care Consult Complete - -  SW Recovery Care/Counseling Consult Complete - -  Palliative Care Screening Not Applicable - -  [place] Not Applicable - -  Some recent data might be hidden

## 2021-04-30 NOTE — Progress Notes (Signed)
Bilateral upper extremity vein mapping has been completed. Preliminary results can be found in CV Proc through chart review.  Results were given to the patient's nurse, Tai.  04/30/21 11:36 AM Carlos Levering RVT

## 2021-05-03 ENCOUNTER — Telehealth: Payer: Self-pay | Admitting: Cardiovascular Disease

## 2021-05-03 DIAGNOSIS — Z7982 Long term (current) use of aspirin: Secondary | ICD-10-CM | POA: Diagnosis not present

## 2021-05-03 DIAGNOSIS — D696 Thrombocytopenia, unspecified: Secondary | ICD-10-CM | POA: Diagnosis not present

## 2021-05-03 DIAGNOSIS — I700 Atherosclerosis of aorta: Secondary | ICD-10-CM | POA: Diagnosis not present

## 2021-05-03 DIAGNOSIS — K5900 Constipation, unspecified: Secondary | ICD-10-CM | POA: Diagnosis not present

## 2021-05-03 DIAGNOSIS — Z9981 Dependence on supplemental oxygen: Secondary | ICD-10-CM | POA: Diagnosis not present

## 2021-05-03 DIAGNOSIS — I5043 Acute on chronic combined systolic (congestive) and diastolic (congestive) heart failure: Secondary | ICD-10-CM | POA: Diagnosis not present

## 2021-05-03 DIAGNOSIS — N185 Chronic kidney disease, stage 5: Secondary | ICD-10-CM | POA: Diagnosis not present

## 2021-05-03 DIAGNOSIS — Z951 Presence of aortocoronary bypass graft: Secondary | ICD-10-CM | POA: Diagnosis not present

## 2021-05-03 DIAGNOSIS — Z9181 History of falling: Secondary | ICD-10-CM | POA: Diagnosis not present

## 2021-05-03 DIAGNOSIS — I130 Hypertensive heart and chronic kidney disease with heart failure and stage 1 through stage 4 chronic kidney disease, or unspecified chronic kidney disease: Secondary | ICD-10-CM | POA: Diagnosis not present

## 2021-05-03 DIAGNOSIS — E785 Hyperlipidemia, unspecified: Secondary | ICD-10-CM | POA: Diagnosis not present

## 2021-05-03 DIAGNOSIS — I2510 Atherosclerotic heart disease of native coronary artery without angina pectoris: Secondary | ICD-10-CM | POA: Diagnosis not present

## 2021-05-03 DIAGNOSIS — I272 Pulmonary hypertension, unspecified: Secondary | ICD-10-CM | POA: Diagnosis not present

## 2021-05-03 DIAGNOSIS — E1122 Type 2 diabetes mellitus with diabetic chronic kidney disease: Secondary | ICD-10-CM | POA: Diagnosis not present

## 2021-05-03 DIAGNOSIS — I82612 Acute embolism and thrombosis of superficial veins of left upper extremity: Secondary | ICD-10-CM | POA: Diagnosis not present

## 2021-05-03 DIAGNOSIS — D631 Anemia in chronic kidney disease: Secondary | ICD-10-CM | POA: Diagnosis not present

## 2021-05-03 NOTE — Telephone Encounter (Signed)
Spoke with pts Occupational therapist who wanted clarification on her discharge medications. Pt had carvedilol included in her take home medications, but did not see this listed as a discharge med. I reviewed hospital notes and orders and found an inpatient order for carvedilol which was d/c'd the following day(on 6/2). I did not see an outpatient or discharge orders for this medication.   We also reviewed her discharge medication as torsemide 40/20 qd and metolazone 2.'5mg'$  2x/week with KCL supplements on those days per Dr. Clayborne Dana note on 7/19 and her discharge summary.   The OT also noted some wheezing in left lower lobe of lung. Pt is not using an incentive spirometer.   I advised the OT I would forward this message and her concerns to Dr. Clayborne Dana office for review and follow up if needed. She will also contact pt's home health RN to try and find her an incentive spirometer as well.

## 2021-05-03 NOTE — Telephone Encounter (Signed)
Selinda Eon, occupational therapist with [facility], is requesting to speak with Dr. Elmarie Shiley nurse to discuss the patient's medications. She states she had a start of care visit with the patient today and she wants to go over a few things.

## 2021-05-04 DIAGNOSIS — I700 Atherosclerosis of aorta: Secondary | ICD-10-CM | POA: Diagnosis not present

## 2021-05-04 DIAGNOSIS — I5043 Acute on chronic combined systolic (congestive) and diastolic (congestive) heart failure: Secondary | ICD-10-CM | POA: Diagnosis not present

## 2021-05-04 DIAGNOSIS — K5900 Constipation, unspecified: Secondary | ICD-10-CM | POA: Diagnosis not present

## 2021-05-04 DIAGNOSIS — I82612 Acute embolism and thrombosis of superficial veins of left upper extremity: Secondary | ICD-10-CM | POA: Diagnosis not present

## 2021-05-04 DIAGNOSIS — D631 Anemia in chronic kidney disease: Secondary | ICD-10-CM | POA: Diagnosis not present

## 2021-05-04 DIAGNOSIS — Z9181 History of falling: Secondary | ICD-10-CM | POA: Diagnosis not present

## 2021-05-04 DIAGNOSIS — Z951 Presence of aortocoronary bypass graft: Secondary | ICD-10-CM | POA: Diagnosis not present

## 2021-05-04 DIAGNOSIS — I272 Pulmonary hypertension, unspecified: Secondary | ICD-10-CM | POA: Diagnosis not present

## 2021-05-04 DIAGNOSIS — I2510 Atherosclerotic heart disease of native coronary artery without angina pectoris: Secondary | ICD-10-CM | POA: Diagnosis not present

## 2021-05-04 DIAGNOSIS — N185 Chronic kidney disease, stage 5: Secondary | ICD-10-CM | POA: Diagnosis not present

## 2021-05-04 DIAGNOSIS — E785 Hyperlipidemia, unspecified: Secondary | ICD-10-CM | POA: Diagnosis not present

## 2021-05-04 DIAGNOSIS — D696 Thrombocytopenia, unspecified: Secondary | ICD-10-CM | POA: Diagnosis not present

## 2021-05-04 DIAGNOSIS — E1122 Type 2 diabetes mellitus with diabetic chronic kidney disease: Secondary | ICD-10-CM | POA: Diagnosis not present

## 2021-05-04 DIAGNOSIS — Z7982 Long term (current) use of aspirin: Secondary | ICD-10-CM | POA: Diagnosis not present

## 2021-05-04 DIAGNOSIS — Z9981 Dependence on supplemental oxygen: Secondary | ICD-10-CM | POA: Diagnosis not present

## 2021-05-04 DIAGNOSIS — I130 Hypertensive heart and chronic kidney disease with heart failure and stage 1 through stage 4 chronic kidney disease, or unspecified chronic kidney disease: Secondary | ICD-10-CM | POA: Diagnosis not present

## 2021-05-06 ENCOUNTER — Telehealth: Payer: Self-pay | Admitting: Family Medicine

## 2021-05-06 NOTE — Telephone Encounter (Signed)
Langley Gauss from [facility] called to let Dr. Volanda Napoleon know that she did a nurse evaluation on Saturday.   1 time a week for heart and dietary instructions  The patients blood sugars were 380  The patient needs the needle to prick her finger to check her blood sugars and she wants toknow if Dr. Volanda Napoleon can send in a Rx for a lancet device.  The lancet was lost somewhere in the cluttered house and she has the meter but nothing to prick her finger with.  The patient told Langley Gauss that she stopped checking her sugars about 3 weeks before she was in the hospital.  Langley Gauss is sending orders for Home Kit that she needs Dr. Volanda Napoleon to sign.

## 2021-05-07 ENCOUNTER — Other Ambulatory Visit: Payer: Self-pay

## 2021-05-07 ENCOUNTER — Telehealth (HOSPITAL_COMMUNITY): Payer: Self-pay

## 2021-05-07 DIAGNOSIS — Z9981 Dependence on supplemental oxygen: Secondary | ICD-10-CM | POA: Diagnosis not present

## 2021-05-07 DIAGNOSIS — E1122 Type 2 diabetes mellitus with diabetic chronic kidney disease: Secondary | ICD-10-CM | POA: Diagnosis not present

## 2021-05-07 DIAGNOSIS — D696 Thrombocytopenia, unspecified: Secondary | ICD-10-CM | POA: Diagnosis not present

## 2021-05-07 DIAGNOSIS — E785 Hyperlipidemia, unspecified: Secondary | ICD-10-CM | POA: Diagnosis not present

## 2021-05-07 DIAGNOSIS — I700 Atherosclerosis of aorta: Secondary | ICD-10-CM | POA: Diagnosis not present

## 2021-05-07 DIAGNOSIS — Z951 Presence of aortocoronary bypass graft: Secondary | ICD-10-CM | POA: Diagnosis not present

## 2021-05-07 DIAGNOSIS — I5043 Acute on chronic combined systolic (congestive) and diastolic (congestive) heart failure: Secondary | ICD-10-CM | POA: Diagnosis not present

## 2021-05-07 DIAGNOSIS — I2510 Atherosclerotic heart disease of native coronary artery without angina pectoris: Secondary | ICD-10-CM | POA: Diagnosis not present

## 2021-05-07 DIAGNOSIS — D631 Anemia in chronic kidney disease: Secondary | ICD-10-CM | POA: Diagnosis not present

## 2021-05-07 DIAGNOSIS — K5900 Constipation, unspecified: Secondary | ICD-10-CM | POA: Diagnosis not present

## 2021-05-07 DIAGNOSIS — Z9181 History of falling: Secondary | ICD-10-CM | POA: Diagnosis not present

## 2021-05-07 DIAGNOSIS — I272 Pulmonary hypertension, unspecified: Secondary | ICD-10-CM | POA: Diagnosis not present

## 2021-05-07 DIAGNOSIS — N185 Chronic kidney disease, stage 5: Secondary | ICD-10-CM | POA: Diagnosis not present

## 2021-05-07 DIAGNOSIS — Z7982 Long term (current) use of aspirin: Secondary | ICD-10-CM | POA: Diagnosis not present

## 2021-05-07 DIAGNOSIS — I130 Hypertensive heart and chronic kidney disease with heart failure and stage 1 through stage 4 chronic kidney disease, or unspecified chronic kidney disease: Secondary | ICD-10-CM | POA: Diagnosis not present

## 2021-05-07 DIAGNOSIS — I82612 Acute embolism and thrombosis of superficial veins of left upper extremity: Secondary | ICD-10-CM | POA: Diagnosis not present

## 2021-05-07 NOTE — Telephone Encounter (Signed)
Attempted to contact pt regarding new referral to paramedicine.  The line was busy- I tried a few times. Will try again tomor.   Marylouise Stacks, EMT-Paramedic  05/07/21

## 2021-05-08 ENCOUNTER — Telehealth: Payer: Self-pay

## 2021-05-08 ENCOUNTER — Ambulatory Visit (INDEPENDENT_AMBULATORY_CARE_PROVIDER_SITE_OTHER): Payer: BC Managed Care – PPO | Admitting: Family Medicine

## 2021-05-08 ENCOUNTER — Encounter: Payer: Self-pay | Admitting: Family Medicine

## 2021-05-08 ENCOUNTER — Other Ambulatory Visit: Payer: Self-pay

## 2021-05-08 VITALS — BP 132/70 | HR 98 | Temp 98.4°F

## 2021-05-08 DIAGNOSIS — E1165 Type 2 diabetes mellitus with hyperglycemia: Secondary | ICD-10-CM | POA: Diagnosis not present

## 2021-05-08 DIAGNOSIS — I2510 Atherosclerotic heart disease of native coronary artery without angina pectoris: Secondary | ICD-10-CM

## 2021-05-08 DIAGNOSIS — I5043 Acute on chronic combined systolic (congestive) and diastolic (congestive) heart failure: Secondary | ICD-10-CM

## 2021-05-08 DIAGNOSIS — D638 Anemia in other chronic diseases classified elsewhere: Secondary | ICD-10-CM

## 2021-05-08 DIAGNOSIS — I10 Essential (primary) hypertension: Secondary | ICD-10-CM | POA: Diagnosis not present

## 2021-05-08 DIAGNOSIS — M62838 Other muscle spasm: Secondary | ICD-10-CM | POA: Diagnosis not present

## 2021-05-08 DIAGNOSIS — E875 Hyperkalemia: Secondary | ICD-10-CM

## 2021-05-08 DIAGNOSIS — N179 Acute kidney failure, unspecified: Secondary | ICD-10-CM | POA: Diagnosis not present

## 2021-05-08 DIAGNOSIS — N189 Chronic kidney disease, unspecified: Secondary | ICD-10-CM

## 2021-05-08 DIAGNOSIS — Z794 Long term (current) use of insulin: Secondary | ICD-10-CM

## 2021-05-08 LAB — CBC WITH DIFFERENTIAL/PLATELET
Basophils Absolute: 0 10*3/uL (ref 0.0–0.1)
Basophils Relative: 0.1 % (ref 0.0–3.0)
Eosinophils Absolute: 0 10*3/uL (ref 0.0–0.7)
Eosinophils Relative: 0.5 % (ref 0.0–5.0)
HCT: 25.4 % — ABNORMAL LOW (ref 36.0–46.0)
Hemoglobin: 8.3 g/dL — ABNORMAL LOW (ref 12.0–15.0)
Lymphocytes Relative: 3.8 % — ABNORMAL LOW (ref 12.0–46.0)
Lymphs Abs: 0.2 10*3/uL — ABNORMAL LOW (ref 0.7–4.0)
MCHC: 32.7 g/dL (ref 30.0–36.0)
MCV: 101.3 fl — ABNORMAL HIGH (ref 78.0–100.0)
Monocytes Absolute: 0.3 10*3/uL (ref 0.1–1.0)
Monocytes Relative: 5 % (ref 3.0–12.0)
Neutro Abs: 4.7 10*3/uL (ref 1.4–7.7)
Neutrophils Relative %: 90.6 % — ABNORMAL HIGH (ref 43.0–77.0)
Platelets: 159 10*3/uL (ref 150.0–400.0)
RBC: 2.51 Mil/uL — ABNORMAL LOW (ref 3.87–5.11)
RDW: 17.8 % — ABNORMAL HIGH (ref 11.5–15.5)
WBC: 5.2 10*3/uL (ref 4.0–10.5)

## 2021-05-08 LAB — COMPREHENSIVE METABOLIC PANEL WITH GFR
ALT: 17 U/L (ref 0–35)
AST: 11 U/L (ref 0–37)
Albumin: 4.3 g/dL (ref 3.5–5.2)
Alkaline Phosphatase: 82 U/L (ref 39–117)
BUN: 117 mg/dL (ref 6–23)
CO2: 43 meq/L — ABNORMAL HIGH (ref 19–32)
Calcium: 9.8 mg/dL (ref 8.4–10.5)
Chloride: 88 meq/L — ABNORMAL LOW (ref 96–112)
Creatinine, Ser: 4.48 mg/dL — ABNORMAL HIGH (ref 0.40–1.20)
GFR: 9.74 mL/min — CL
Glucose, Bld: 367 mg/dL — ABNORMAL HIGH (ref 70–99)
Potassium: 6.2 meq/L (ref 3.5–5.1)
Sodium: 139 meq/L (ref 135–145)
Total Bilirubin: 0.6 mg/dL (ref 0.2–1.2)
Total Protein: 7.2 g/dL (ref 6.0–8.3)

## 2021-05-08 LAB — MAGNESIUM: Magnesium: 2.9 mg/dL — ABNORMAL HIGH (ref 1.5–2.5)

## 2021-05-08 MED ORDER — LACTULOSE 10 GM/15ML PO SOLN: 10.0000 g | Freq: Once | ORAL | 0 refills | Status: AC

## 2021-05-08 MED ORDER — ACCU-CHEK SOFTCLIX LANCETS MISC: 12 refills | Status: AC

## 2021-05-08 NOTE — Telephone Encounter (Signed)
CRITICAL VALUE STICKER  CRITICAL VALUE: potassium 6.2; BUN 117; GFR 9.74  RECEIVER (on-site recipient of call): Darah Simkin  DATE & TIME NOTIFIED: 2:45 pm  MESSENGER (representative from lab): Malcom  MD NOTIFIED: Volanda Napoleon  TIME OF NOTIFICATION: 2:46 pm  RESPONSE:

## 2021-05-08 NOTE — Patient Instructions (Addendum)
You need to restart the Lantus 4 units in order to get better control of your blood sugar.  You may need someone who can help you with this.  It is important that you check your blood sugar daily.

## 2021-05-08 NOTE — Progress Notes (Signed)
Subjective:    Patient ID: Beverly Martin, female    DOB: 08/06/1956, 65 y.o.   MRN: [id number]  No chief complaint on file. Patient accompanied by her husband.  HPI Patient was seen today for HFU.  Hospitalized 04/24/2019 2-04/2018 for CHF exacerbation.  Diuresed -5382 mL.  Echo with moderate systolic LV reduction 99991111 with left ventricle global hypokinesis, RVSP 54.  Acute on chronic kidney disease stage V.  Vein mapping done, superficial vein thrombosis of left cephalic vein.  Given sliding scale insulin and 5 units glargine basal insulin.  Blood sugars were well controlled during hospitalization.  Treated for UTI x2 days with cephalexin.  Urine culture with less than 100,000 CFU's Klebsiella.  Anemia 2/2 CKD, received darbepoetin.  Since discharge patient states she has been doing better.  Denies SOB, CP.  Unsure about LE edema as her legs always feel the same.  Weight 112 LBS this morning.  Patient states appetite comes and goes.  Had soup this morning.  Not checking FSBS at home or taking insulin.  States Lantus was discontinued at discharge.  Has home health visit 1-2 times per week.  Has upcoming appointment with nephrologist.  1 L O2 via [place] at home.  Past Medical History:  Diagnosis Date   CAD (coronary artery disease)    Combined systolic and diastolic ACC/AHA stage C congestive heart failure (HCC)    Diabetes mellitus without complication ([place])    Essential hypertension    Hyperlipidemia     No Known Allergies  ROS General: Denies fever, chills, night sweats, changes in weight, changes in appetite HEENT: Denies headaches, ear pain, changes in vision, rhinorrhea, sore throat CV: Denies CP, palpitations, SOB, orthopnea Pulm: Denies SOB, cough, wheezing GI: Denies abdominal pain, nausea, vomiting, diarrhea, constipation GU: Denies dysuria, hematuria, frequency, vaginal discharge Msk: Denies muscle cramps, joint pains Neuro: Denies weakness, numbness, tingling Skin: Denies  rashes, bruising Psych: Denies depression, anxiety, hallucinations    Objective:    Blood pressure 132/70, pulse 98, temperature 98.4 F (36.9 C), temperature source Oral, SpO2 99 %.  Weight 112 LBS at home this AM.  Gen. [place], appears thinner, fatigued, normal affect on 1 L O2 via [place]. HEENT: [place]/AT, face symmetric, conjunctiva clear, no scleral icterus, PERRLA, EOMI, nares patent without drainage Lungs: no accessory muscle use, CTAB, no wheezes or rales Cardiovascular: RRR, no m/r/g, trace edema in b/l LEs. Abdomen: BS present, soft, NT/ND Musculoskeletal: Muscle fasciculations noted in b/l upper and lower LEs.  Muscle spasm elicited with tapping on right wrist no deformities, no cyanosis or clubbing, normal tone Neuro:  A&Ox3, CN II-XII intact, sitting in transport wheelchair Skin:  Warm, no lesions/ rash   Wt Readings from Last 3 Encounters:  04/30/21 112 lb 1.6 oz (50.8 kg)  04/10/21 118 lb 12.8 oz (53.9 kg)  03/15/21 114 lb 13.8 oz (52.1 kg)    Lab Results  Component Value Date   WBC 5.5 04/29/2021   HGB 8.3 (L) 04/30/2021   HCT 26.7 (L) 04/30/2021   PLT 122 (L) 04/29/2021   GLUCOSE 138 (H) 04/30/2021   CHOL 245 (H) 06/10/2020   TRIG 93 06/10/2020   HDL 69 06/10/2020   LDLCALC 157 (H) 06/10/2020   ALT 11 03/14/2021   AST 12 (L) 03/14/2021   NA 138 04/30/2021   K 4.0 04/30/2021   CL 84 (L) 04/30/2021   CREATININE 3.59 (H) 04/30/2021   BUN 85 (H) 04/30/2021   CO2 47 (H) 04/30/2021   TSH  1.901 04/24/2021   INR 1.1 01/18/2021   HGBA1C 9.1 (H) 03/14/2021   MICROALBUR 33.3 (H) 12/06/2020    Assessment/Plan:  Acute on chronic combined systolic (congestive) and diastolic (congestive) heart failure (HCC) -Euvolemic -Diuresed 538 2 mL during hospitalization -Discussed the importance of daily weights, reducing sodium intake, and medication compliance -Continue torsemide and biweekly metolazone, BiDil -Concerns pt and has been do not fully understand the severity  of her current illnesses. -Continue home O2 1 L via [place] -Given strict precautions -Continue follow-up with cardiology  Essential hypertension  -mildly elevated -Lifestyle modifications -Continue current medications - Plan: CMP  Coronary artery disease involving native coronary artery of native heart without angina pectoris -Lifestyle modifications -Lipitor 40 mg daily -Continue follow-up with cardiology  Type 2 diabetes mellitus with hyperglycemia, with long-term current use of insulin (HCC)  -hgb A1C 9.1% on 03/14/21 -fsbs 367 in clinic -reeducated about diet, how to check fsbs, and how to use insulin. -Concerns about compliance.  Patient limited in diabetic treatment options 2/2 CKD 5 - Plan: CMP, Accu-Chek Softclix Lancets lancets  Anemia of chronic disease -Macrocytic anemia noted as MCV one 102.2 -Hemoglobin 8.3 at time of discharge 04/30/21 - Plan: CBC with Differential/Platelet  Muscle spasm -Concern for electrolyte abnormality - Plan: CMP, Magnesium  Acute kidney injury superimposed on chronic kidney disease ([place])  -vein mapping completed during hospitalization.  Superficial vein thrombosis at left cephalic vein. -Encouraged to keep upcoming appointment with nephrology - Plan: CMP  Update: Critical lab value called to clinic, potassium 6.2.  GFR 9.74.   Hyperkalemia  -recheck bmp tomorrow 7/28 - Plan: lactulose (CHRONULAC) 10 GM/15ML solution  F/u in 2 wks  Grier Mitts, MD

## 2021-05-09 ENCOUNTER — Other Ambulatory Visit: Payer: Self-pay | Admitting: Family Medicine

## 2021-05-09 ENCOUNTER — Other Ambulatory Visit: Payer: Self-pay

## 2021-05-09 ENCOUNTER — Other Ambulatory Visit (INDEPENDENT_AMBULATORY_CARE_PROVIDER_SITE_OTHER): Payer: BC Managed Care – PPO

## 2021-05-09 ENCOUNTER — Telehealth: Payer: Self-pay

## 2021-05-09 DIAGNOSIS — N179 Acute kidney failure, unspecified: Secondary | ICD-10-CM

## 2021-05-09 DIAGNOSIS — Z7982 Long term (current) use of aspirin: Secondary | ICD-10-CM | POA: Diagnosis not present

## 2021-05-09 DIAGNOSIS — Z9981 Dependence on supplemental oxygen: Secondary | ICD-10-CM | POA: Diagnosis not present

## 2021-05-09 DIAGNOSIS — Z951 Presence of aortocoronary bypass graft: Secondary | ICD-10-CM | POA: Diagnosis not present

## 2021-05-09 DIAGNOSIS — I82612 Acute embolism and thrombosis of superficial veins of left upper extremity: Secondary | ICD-10-CM | POA: Diagnosis not present

## 2021-05-09 DIAGNOSIS — I272 Pulmonary hypertension, unspecified: Secondary | ICD-10-CM | POA: Diagnosis not present

## 2021-05-09 DIAGNOSIS — I700 Atherosclerosis of aorta: Secondary | ICD-10-CM | POA: Diagnosis not present

## 2021-05-09 DIAGNOSIS — Z9181 History of falling: Secondary | ICD-10-CM | POA: Diagnosis not present

## 2021-05-09 DIAGNOSIS — D631 Anemia in chronic kidney disease: Secondary | ICD-10-CM | POA: Diagnosis not present

## 2021-05-09 DIAGNOSIS — I2510 Atherosclerotic heart disease of native coronary artery without angina pectoris: Secondary | ICD-10-CM | POA: Diagnosis not present

## 2021-05-09 DIAGNOSIS — I130 Hypertensive heart and chronic kidney disease with heart failure and stage 1 through stage 4 chronic kidney disease, or unspecified chronic kidney disease: Secondary | ICD-10-CM | POA: Diagnosis not present

## 2021-05-09 DIAGNOSIS — E875 Hyperkalemia: Secondary | ICD-10-CM

## 2021-05-09 DIAGNOSIS — E1122 Type 2 diabetes mellitus with diabetic chronic kidney disease: Secondary | ICD-10-CM | POA: Diagnosis not present

## 2021-05-09 DIAGNOSIS — D696 Thrombocytopenia, unspecified: Secondary | ICD-10-CM | POA: Diagnosis not present

## 2021-05-09 DIAGNOSIS — N185 Chronic kidney disease, stage 5: Secondary | ICD-10-CM | POA: Diagnosis not present

## 2021-05-09 DIAGNOSIS — K5900 Constipation, unspecified: Secondary | ICD-10-CM | POA: Diagnosis not present

## 2021-05-09 DIAGNOSIS — E785 Hyperlipidemia, unspecified: Secondary | ICD-10-CM | POA: Diagnosis not present

## 2021-05-09 DIAGNOSIS — I5043 Acute on chronic combined systolic (congestive) and diastolic (congestive) heart failure: Secondary | ICD-10-CM | POA: Diagnosis not present

## 2021-05-09 LAB — COMPREHENSIVE METABOLIC PANEL
ALT: 16 U/L (ref 0–35)
AST: 11 U/L (ref 0–37)
Albumin: 4.2 g/dL (ref 3.5–5.2)
Alkaline Phosphatase: 80 U/L (ref 39–117)
BUN: 113 mg/dL (ref 6–23)
CO2: 44 mEq/L — ABNORMAL HIGH (ref 19–32)
Calcium: 9.9 mg/dL (ref 8.4–10.5)
Chloride: 90 mEq/L — ABNORMAL LOW (ref 96–112)
Creatinine, Ser: 4.6 mg/dL (ref 0.40–1.20)
GFR: 9.43 mL/min — CL (ref >60.00)
Glucose, Bld: 177 mg/dL — ABNORMAL HIGH (ref 70–99)
Potassium: 5.8 mEq/L — ABNORMAL HIGH (ref 3.5–5.1)
Sodium: 142 mEq/L (ref 135–145)
Total Bilirubin: 0.6 mg/dL (ref 0.2–1.2)
Total Protein: 6.8 g/dL (ref 6.0–8.3)

## 2021-05-09 MED ORDER — LACTULOSE 10 GM/15ML PO SOLN: 10.0000 g | Freq: Every day | ORAL | 0 refills | Status: AC

## 2021-05-09 NOTE — Progress Notes (Addendum)
ADVANCED HEART FAILURE CLINIC NOTE  PCP: Dr. Grier Mitts  HF Cardiologist: Dr. Haroldine Laws   HPI: Beverly Martin is a 65 y.o.  emale  with a PMH significant for HFrEF, CAD s/p CABG, HTN, T2DM, HLD, CKD IV.   Echo 01/03/21:  EF 20-25% with severe RV dysfunction (previous EF 40-45%). Felt to be mixed ischemic/NNICM but unable to perform coronary angio due to CKD.  Admitted from [facility] on 01/17/21 with marked volume overload/ADHF. RHC showed marked volume overload with biventricular failure and normal CO.  Milrinone added to augment diuresis. Once euvolemic milrinone stopped. Diuresed 25 pounds. Creatinine improved 3.5 -> 2.9. Discharged on bidil + torsemide 40 mg daily. Discharge weight 115 pounds.  She was called by Nephrology for an appointment but she had not set up the appointment.  She was seen 03/13/21 for HF follow up with her husband. Stable NYHA III symptoms, GDMT limited by renal function. Had not seen nephrology yet.  Admitted 03/14/21 w/ complaints of weakness, near syncope and dyspnea, found to be in acute CHF, BNP>4500. Her blood sugar was >400. She received IV lasix 40 mg. She declined home health and was referred to Palliative Care outpatient.  Clinic follow up 6/22 she was volume overloaded and torsemide increased. She was urged to schedule appointment with Nephrology.   Admitted 04/23/21 for A/C CHF. She had confusion, VBG with PCO2 92, but normal pH. She improved after short trial of BiPap. She was diuresed with IV lasix 120 mg bid, but GDMT, diuresis, and further ischemic evaluation limited by renal dysfunction. Repeat echo with EF improvement from 20-25% to 30-35%, G2DD, moderately reduced RV. Renal consulted to assist with diuretic management & HD consideration. She had renal US 7/13 w/ no obstruction & underwent vein mapping. Hospitalization c/b  + klebsiella UTI. She was discharged on torsemide 40 mg q am/ 20 mg q pm and + metolazone 2.5 on Mondays and Fridays. ReDs on  discharge 41%, weight 112 lbs.  Today she returns for post hospitalization HF follow up with her husband. She says she feels fine, sleeping a lot during the day. SOB with minimal activity, but able to participate in [facility] PT/OT. Denies CP, dizziness, edema, or PND/Orthopnea. Appetite fair. No fever or chills. Weight at home 112 pounds on home scale today. Taking all medications. She has HH RN/PT/OT. Husband asking if she will ever be able to drive again or be able to come off of HD if she starts. Her disability from work is running out.  Cardiac Testing   - Echo (7/22): EF 30-35%, G2DD, moderately reduced RV  - Echo (3/22):  EF has decreased from 45% (8/21) to 20-25%, right ventricular systolic function is severely decreased. RV normal   - RHC (01/2021):  RA = 15 RV = 72/21 PA = 71/22 (40) PCW = 22 (v-wave 29) Fick cardiac output/index = 6.3/3.7 Thermo CO/CI = 4.3/2.6 PVR = 2.9 (Fick) Ao sat = 99% PA sat = 71%. 72% SVC sat = 66%   Assessment: 1. Elevated biventricular pressures 2. Normal cardiac output  ROS: All systems negative except as listed in HPI, PMH and Problem List.  SH:  Social History   Socioeconomic History   Marital status: Married    Spouse name: Not on file   Number of children: Not on file   Years of education: Not on file   Highest education level: Not on file  Occupational History   Not on file  Tobacco Use   Smoking  status: Never   Smokeless tobacco: Never  Vaping Use   Vaping Use: Never used  Substance and Sexual Activity   Alcohol use: No   Drug use: No   Sexual activity: Not Currently  Other Topics Concern   Not on file  Social History Narrative   Not on file   Social Determinants of Health   Financial Resource Strain: Low Risk    Difficulty of Paying Living Expenses: Not hard at all  Food Insecurity: No Food Insecurity   Worried About Charity fundraiser in the Last Year: Never true   [place] in the Last Year: Never true   Transportation Needs: No Transportation Needs   Lack of Transportation (Medical): No   Lack of Transportation (Non-Medical): No  Physical Activity: Insufficiently Active   Days of Exercise per Week: 2 days   Minutes of Exercise per Session: 20 min  Stress: Not on file  Social Connections: Not on file  Intimate Partner Violence: Not on file   FH:  Family History  Problem Relation Age of Onset   Diabetes Mellitus II Mother    Esophageal cancer Father    Past Medical History:  Diagnosis Date   CAD (coronary artery disease)    Combined systolic and diastolic ACC/AHA stage C congestive heart failure (HCC)    Diabetes mellitus without complication (HCC)    Essential hypertension    Hyperlipidemia    Current Outpatient Medications  Medication Sig Dispense Refill   Accu-Chek Softclix Lancets lancets Use as instructed 100 each 12   aspirin EC 81 MG EC tablet Take 1 tablet (81 mg total) by mouth daily. Swallow whole. 360 tablet 0   atorvastatin (LIPITOR) 40 MG tablet Take 1 tablet (40 mg total) by mouth daily. 30 tablet 2   calcitRIOL (ROCALTROL) 0.25 MCG capsule TAKE 1 CAPSULE BY MOUTH EVERY DAY 90 capsule 0   isosorbide-hydrALAZINE (BIDIL) 20-37.5 MG tablet Take 1 tablet by mouth 3 (three) times daily. 90 tablet 0   lactulose (CHRONULAC) 10 GM/15ML solution Take 15 mLs (10 g total) by mouth daily for 5 days. 75 mL 0   metolazone (ZAROXOLYN) 2.5 MG tablet Take 1 tablet (2.5 mg total) by mouth 2 (two) times a week. Take one tablet on Monday and one tablet on Friday 20 tablet 0   potassium chloride SA (KLOR-CON) 20 MEQ tablet Take 2 tablets (40 mEq total) by mouth 2 (two) times a week. Take 2 tablets on Monday and 2 tablets on Friday (with motolazone) 30 tablet 0   torsemide (DEMADEX) 20 MG tablet Take 1 tablet (20 mg total) by mouth every evening. 30 tablet 0   torsemide 40 MG TABS Take 40 mg by mouth every morning. 30 tablet 0   No current facility-administered medications for this  encounter.  BP 126/82   Pulse 84   Ht '5\' 5"'$  (1.651 m)   Wt 47.6 kg (105 lb)   SpO2 98%   BMI 17.47 kg/m   Wt Readings from Last 3 Encounters:  04/15/2021 47.6 kg (105 lb)  04/30/21 50.8 kg (112 lb 1.6 oz)  04/10/21 53.9 kg (118 lb 12.8 oz)   PHYSICAL EXAM: General:  NAD. No resp difficulty, on oxygen, weak-appearing, cachetic arrived in [facility]. HEENT: Normal Neck: Supple. JVP 6-7. Carotids 2+ bilat; no bruits. No lymphadenopathy or thryomegaly appreciated. Cor: PMI nondisplaced. Regular rate & rhythm. No rubs, gallops, II/VI SEM LSB Lungs: Markedly diminished BS Abdomen: Soft, nontender, nondistended. No hepatosplenomegaly. No bruits  or masses. Good bowel sounds. Extremities: No cyanosis, clubbing, rash, edema Neuro: Alert & oriented x 3, cranial nerves grossly intact. Moves all 4 extremities w/o difficulty. Affect pleasant.  ECG: SR LVH 84 bpm (personally reviewed).  ReDs: 38%  Assessment/Plan 1. Chronic Combined Systolic and Diastolic CHF: - Likely mixed ischemic and NICM, poorly controlled HTN, poor medication adherence may also have progressive CAD but unable to do coronary angio due to CKD 4. - Echo (12/2020): EF down from 45-50% in 2019 to 20-25%, RV moderately reduced - Echo (04/25/21): EF 30-35%, G2DD, moderately reduced RV function. - GDMT limited by renal function. - NYHA IIIb. Volume ok today, REDS 38%, weight down 7 lbs from discharge. - Continue torsemide 40 mg am/20 mg pm + metolazone 2.5 mg on Mondays and Fridays w/ 40 KCL.  - Continue bidil 1 tab tid. - Not candidate for advanced HF therapies. Has refused Palliative services in past.  - Encouraged her eat balanced meals. - BMET today.   2. CAD s/p CABG 2005 - No s/s angina. - Continue ASA/statin.   3. T2DM: - Hgb A1C 9.1 (6/22). - Not a candidate for SGLT2i (GFR and hx of urosepsis).   4. HTN: - Stable today. - Bidil as above.   5. CKD Stage 4: - SCr baseline 3.6. - Will likely need HD in near future.  Vein mapping completed during last admit. - She has f/u with [facility] scheduled.  She is sleeping more during the day and I worry she is at risk for dehydration and malnutrition, and further functional decline. She has been referred to Paramedicine, and has Home Health services currently. We discussed HD today, she and her husband continue to have very limited insight into the severity of her disease.   Follow up w/ Dr. Haroldine Laws as scheduled next month.   Allena Katz, FNP-BC 04/20/2021

## 2021-05-09 NOTE — Telephone Encounter (Signed)
CRITICAL VALUE STICKER  CRITICAL VALUE: BUN 113; GFR 9.43; cret 4.60  RECEIVER (on-site recipient of call): Anberlin Diez  DATE & TIME NOTIFIED: 7/28 11:36 am  MESSENGER (representative from lab): Malcom  MD NOTIFIED: Volanda Napoleon  TIME OF NOTIFICATION: 11:38 am  RESPONSE:

## 2021-05-10 ENCOUNTER — Ambulatory Visit (HOSPITAL_BASED_OUTPATIENT_CLINIC_OR_DEPARTMENT_OTHER)
Admission: RE | Admit: 2021-05-10 | Discharge: 2021-05-10 | Disposition: A | Discharge status: Home / Self Care | Payer: BC Managed Care – PPO | Source: Ambulatory Visit | Attending: Family Medicine | Admitting: Family Medicine

## 2021-05-10 ENCOUNTER — Telehealth (HOSPITAL_COMMUNITY): Payer: Self-pay | Admitting: Family Medicine

## 2021-05-10 ENCOUNTER — Emergency Department (HOSPITAL_COMMUNITY): Payer: BC Managed Care – PPO

## 2021-05-10 ENCOUNTER — Inpatient Hospital Stay (HOSPITAL_COMMUNITY)
Admission: EM | Admit: 2021-05-10 | Discharge: 2021-06-13 | DRG: 673 | Disposition: E | Payer: BC Managed Care – PPO | Attending: Internal Medicine | Admitting: Internal Medicine

## 2021-05-10 ENCOUNTER — Encounter (HOSPITAL_COMMUNITY): Payer: Self-pay

## 2021-05-10 ENCOUNTER — Telehealth: Payer: Self-pay | Admitting: Family Medicine

## 2021-05-10 VITALS — BP 126/82 | HR 84 | Ht 65.0 in | Wt 105.0 lb

## 2021-05-10 DIAGNOSIS — E1122 Type 2 diabetes mellitus with diabetic chronic kidney disease: Secondary | ICD-10-CM

## 2021-05-10 DIAGNOSIS — R64 Cachexia: Secondary | ICD-10-CM | POA: Diagnosis present

## 2021-05-10 DIAGNOSIS — R0602 Shortness of breath: Secondary | ICD-10-CM

## 2021-05-10 DIAGNOSIS — Z87440 Personal history of urinary (tract) infections: Secondary | ICD-10-CM

## 2021-05-10 DIAGNOSIS — Z515 Encounter for palliative care: Secondary | ICD-10-CM

## 2021-05-10 DIAGNOSIS — I5022 Chronic systolic (congestive) heart failure: Secondary | ICD-10-CM

## 2021-05-10 DIAGNOSIS — N179 Acute kidney failure, unspecified: Secondary | ICD-10-CM | POA: Diagnosis not present

## 2021-05-10 DIAGNOSIS — E875 Hyperkalemia: Secondary | ICD-10-CM | POA: Diagnosis present

## 2021-05-10 DIAGNOSIS — Z681 Body mass index (BMI) 19 or less, adult: Secondary | ICD-10-CM | POA: Diagnosis not present

## 2021-05-10 DIAGNOSIS — I428 Other cardiomyopathies: Secondary | ICD-10-CM | POA: Diagnosis present

## 2021-05-10 DIAGNOSIS — I5042 Chronic combined systolic (congestive) and diastolic (congestive) heart failure: Secondary | ICD-10-CM | POA: Insufficient documentation

## 2021-05-10 DIAGNOSIS — D631 Anemia in chronic kidney disease: Secondary | ICD-10-CM | POA: Diagnosis not present

## 2021-05-10 DIAGNOSIS — I130 Hypertensive heart and chronic kidney disease with heart failure and stage 1 through stage 4 chronic kidney disease, or unspecified chronic kidney disease: Secondary | ICD-10-CM | POA: Insufficient documentation

## 2021-05-10 DIAGNOSIS — Z9981 Dependence on supplemental oxygen: Secondary | ICD-10-CM | POA: Diagnosis not present

## 2021-05-10 DIAGNOSIS — E440 Moderate protein-calorie malnutrition: Secondary | ICD-10-CM | POA: Insufficient documentation

## 2021-05-10 DIAGNOSIS — Z66 Do not resuscitate: Secondary | ICD-10-CM | POA: Diagnosis not present

## 2021-05-10 DIAGNOSIS — R0789 Other chest pain: Secondary | ICD-10-CM

## 2021-05-10 DIAGNOSIS — L89311 Pressure ulcer of right buttock, stage 1: Secondary | ICD-10-CM | POA: Diagnosis not present

## 2021-05-10 DIAGNOSIS — Z91138 Patient's unintentional underdosing of medication regimen for other reason: Secondary | ICD-10-CM

## 2021-05-10 DIAGNOSIS — I272 Pulmonary hypertension, unspecified: Secondary | ICD-10-CM | POA: Diagnosis not present

## 2021-05-10 DIAGNOSIS — N19 Unspecified kidney failure: Secondary | ICD-10-CM | POA: Diagnosis not present

## 2021-05-10 DIAGNOSIS — N186 End stage renal disease: Secondary | ICD-10-CM | POA: Diagnosis present

## 2021-05-10 DIAGNOSIS — Z992 Dependence on renal dialysis: Secondary | ICD-10-CM | POA: Diagnosis not present

## 2021-05-10 DIAGNOSIS — E611 Iron deficiency: Secondary | ICD-10-CM | POA: Diagnosis present

## 2021-05-10 DIAGNOSIS — E1169 Type 2 diabetes mellitus with other specified complication: Secondary | ICD-10-CM | POA: Diagnosis present

## 2021-05-10 DIAGNOSIS — Z7189 Other specified counseling: Secondary | ICD-10-CM | POA: Diagnosis not present

## 2021-05-10 DIAGNOSIS — I517 Cardiomegaly: Secondary | ICD-10-CM | POA: Diagnosis not present

## 2021-05-10 DIAGNOSIS — R627 Adult failure to thrive: Secondary | ICD-10-CM | POA: Diagnosis present

## 2021-05-10 DIAGNOSIS — R778 Other specified abnormalities of plasma proteins: Secondary | ICD-10-CM | POA: Diagnosis present

## 2021-05-10 DIAGNOSIS — Z20822 Contact with and (suspected) exposure to covid-19: Secondary | ICD-10-CM | POA: Diagnosis not present

## 2021-05-10 DIAGNOSIS — Z7982 Long term (current) use of aspirin: Secondary | ICD-10-CM | POA: Insufficient documentation

## 2021-05-10 DIAGNOSIS — R0609 Other forms of dyspnea: Secondary | ICD-10-CM | POA: Diagnosis not present

## 2021-05-10 DIAGNOSIS — E785 Hyperlipidemia, unspecified: Secondary | ICD-10-CM

## 2021-05-10 DIAGNOSIS — R4182 Altered mental status, unspecified: Secondary | ICD-10-CM | POA: Diagnosis not present

## 2021-05-10 DIAGNOSIS — I5082 Biventricular heart failure: Secondary | ICD-10-CM | POA: Insufficient documentation

## 2021-05-10 DIAGNOSIS — K5900 Constipation, unspecified: Secondary | ICD-10-CM | POA: Diagnosis not present

## 2021-05-10 DIAGNOSIS — I10 Essential (primary) hypertension: Secondary | ICD-10-CM | POA: Diagnosis present

## 2021-05-10 DIAGNOSIS — Z951 Presence of aortocoronary bypass graft: Secondary | ICD-10-CM | POA: Insufficient documentation

## 2021-05-10 DIAGNOSIS — I959 Hypotension, unspecified: Secondary | ICD-10-CM | POA: Diagnosis not present

## 2021-05-10 DIAGNOSIS — J90 Pleural effusion, not elsewhere classified: Secondary | ICD-10-CM | POA: Diagnosis not present

## 2021-05-10 DIAGNOSIS — Z79899 Other long term (current) drug therapy: Secondary | ICD-10-CM | POA: Insufficient documentation

## 2021-05-10 DIAGNOSIS — D539 Nutritional anemia, unspecified: Secondary | ICD-10-CM | POA: Diagnosis present

## 2021-05-10 DIAGNOSIS — T502X5A Adverse effect of carbonic-anhydrase inhibitors, benzothiadiazides and other diuretics, initial encounter: Secondary | ICD-10-CM | POA: Diagnosis not present

## 2021-05-10 DIAGNOSIS — L8990 Pressure ulcer of unspecified site, unspecified stage: Secondary | ICD-10-CM | POA: Insufficient documentation

## 2021-05-10 DIAGNOSIS — N185 Chronic kidney disease, stage 5: Secondary | ICD-10-CM | POA: Diagnosis not present

## 2021-05-10 DIAGNOSIS — E1165 Type 2 diabetes mellitus with hyperglycemia: Secondary | ICD-10-CM | POA: Diagnosis present

## 2021-05-10 DIAGNOSIS — D696 Thrombocytopenia, unspecified: Secondary | ICD-10-CM | POA: Diagnosis not present

## 2021-05-10 DIAGNOSIS — M898X9 Other specified disorders of bone, unspecified site: Secondary | ICD-10-CM | POA: Diagnosis present

## 2021-05-10 DIAGNOSIS — I132 Hypertensive heart and chronic kidney disease with heart failure and with stage 5 chronic kidney disease, or end stage renal disease: Secondary | ICD-10-CM | POA: Diagnosis not present

## 2021-05-10 DIAGNOSIS — R54 Age-related physical debility: Secondary | ICD-10-CM | POA: Diagnosis present

## 2021-05-10 DIAGNOSIS — E873 Alkalosis: Secondary | ICD-10-CM | POA: Diagnosis not present

## 2021-05-10 DIAGNOSIS — Z800 Family history of malignant neoplasm of digestive organs: Secondary | ICD-10-CM

## 2021-05-10 DIAGNOSIS — R5383 Other fatigue: Secondary | ICD-10-CM | POA: Diagnosis not present

## 2021-05-10 DIAGNOSIS — N184 Chronic kidney disease, stage 4 (severe): Secondary | ICD-10-CM | POA: Diagnosis not present

## 2021-05-10 DIAGNOSIS — R7989 Other specified abnormal findings of blood chemistry: Secondary | ICD-10-CM | POA: Diagnosis present

## 2021-05-10 DIAGNOSIS — R531 Weakness: Secondary | ICD-10-CM | POA: Diagnosis not present

## 2021-05-10 DIAGNOSIS — Z833 Family history of diabetes mellitus: Secondary | ICD-10-CM

## 2021-05-10 DIAGNOSIS — I2510 Atherosclerotic heart disease of native coronary artery without angina pectoris: Secondary | ICD-10-CM | POA: Diagnosis present

## 2021-05-10 DIAGNOSIS — R251 Tremor, unspecified: Secondary | ICD-10-CM | POA: Diagnosis not present

## 2021-05-10 DIAGNOSIS — B961 Klebsiella pneumoniae [K. pneumoniae] as the cause of diseases classified elsewhere: Secondary | ICD-10-CM | POA: Diagnosis present

## 2021-05-10 DIAGNOSIS — I5043 Acute on chronic combined systolic (congestive) and diastolic (congestive) heart failure: Secondary | ICD-10-CM | POA: Diagnosis not present

## 2021-05-10 DIAGNOSIS — J9811 Atelectasis: Secondary | ICD-10-CM | POA: Diagnosis not present

## 2021-05-10 DIAGNOSIS — T501X6A Underdosing of loop [high-ceiling] diuretics, initial encounter: Secondary | ICD-10-CM | POA: Diagnosis present

## 2021-05-10 DIAGNOSIS — Z4901 Encounter for fitting and adjustment of extracorporeal dialysis catheter: Secondary | ICD-10-CM | POA: Diagnosis not present

## 2021-05-10 DIAGNOSIS — I82612 Acute embolism and thrombosis of superficial veins of left upper extremity: Secondary | ICD-10-CM | POA: Diagnosis not present

## 2021-05-10 DIAGNOSIS — E869 Volume depletion, unspecified: Secondary | ICD-10-CM | POA: Diagnosis present

## 2021-05-10 DIAGNOSIS — I255 Ischemic cardiomyopathy: Secondary | ICD-10-CM | POA: Diagnosis present

## 2021-05-10 DIAGNOSIS — Z9181 History of falling: Secondary | ICD-10-CM | POA: Diagnosis not present

## 2021-05-10 DIAGNOSIS — T501X5A Adverse effect of loop [high-ceiling] diuretics, initial encounter: Secondary | ICD-10-CM | POA: Diagnosis not present

## 2021-05-10 DIAGNOSIS — I700 Atherosclerosis of aorta: Secondary | ICD-10-CM | POA: Diagnosis not present

## 2021-05-10 HISTORY — DX: Chronic kidney disease, stage 4 (severe): N18.4

## 2021-05-10 LAB — BASIC METABOLIC PANEL
Anion gap: 8 (ref 5–15)
Anion gap: 8 (ref 5–15)
BUN: 112 mg/dL — ABNORMAL HIGH (ref 8–23)
BUN: 111 mg/dL — ABNORMAL HIGH (ref 8–23)
CO2: 45 mmol/L — ABNORMAL HIGH (ref 22–32)
CO2: 44 mmol/L — ABNORMAL HIGH (ref 22–32)
Calcium: 9.7 mg/dL (ref 8.9–10.3)
Calcium: 9.6 mg/dL (ref 8.9–10.3)
Chloride: 90 mmol/L — ABNORMAL LOW (ref 98–111)
Chloride: 89 mmol/L — ABNORMAL LOW (ref 98–111)
Creatinine, Ser: 4.65 mg/dL — ABNORMAL HIGH (ref 0.44–1.00)
Creatinine, Ser: 4.61 mg/dL — ABNORMAL HIGH (ref 0.44–1.00)
GFR, Estimated: 10 mL/min — ABNORMAL LOW (ref >60)
GFR, Estimated: 10 mL/min — ABNORMAL LOW (ref >60)
Glucose, Bld: 214 mg/dL — ABNORMAL HIGH (ref 70–99)
Glucose, Bld: 238 mg/dL — ABNORMAL HIGH (ref 70–99)
Potassium: 5.7 mmol/L — ABNORMAL HIGH (ref 3.5–5.1)
Potassium: 5.5 mmol/L — ABNORMAL HIGH (ref 3.5–5.1)
Sodium: 143 mmol/L (ref 135–145)
Sodium: 141 mmol/L (ref 135–145)

## 2021-05-10 LAB — CBC
HCT: 29.6 % — ABNORMAL LOW (ref 36.0–46.0)
Hemoglobin: 8.7 g/dL — ABNORMAL LOW (ref 12.0–15.0)
MCH: 32.3 pg (ref 26.0–34.0)
MCHC: 29.4 g/dL — ABNORMAL LOW (ref 30.0–36.0)
MCV: 110 fL — ABNORMAL HIGH (ref 80.0–100.0)
Platelets: 148 10*3/uL — ABNORMAL LOW (ref 150–400)
RBC: 2.69 MIL/uL — ABNORMAL LOW (ref 3.87–5.11)
RDW: 15.9 % — ABNORMAL HIGH (ref 11.5–15.5)
WBC: 5.1 10*3/uL (ref 4.0–10.5)
nRBC: 0 % (ref 0.0–0.2)

## 2021-05-10 LAB — RESP PANEL BY RT-PCR (FLU A&B, COVID) ARPGX2
Influenza A by PCR: NEGATIVE
Influenza B by PCR: NEGATIVE
SARS Coronavirus 2 by RT PCR: NEGATIVE

## 2021-05-10 LAB — CBC WITH DIFFERENTIAL/PLATELET
Abs Immature Granulocytes: 0.02 10*3/uL (ref 0.00–0.07)
Basophils Absolute: 0 10*3/uL (ref 0.0–0.1)
Basophils Relative: 0 %
Eosinophils Absolute: 0 10*3/uL (ref 0.0–0.5)
Eosinophils Relative: 0 %
HCT: 30.5 % — ABNORMAL LOW (ref 36.0–46.0)
Hemoglobin: 9.1 g/dL — ABNORMAL LOW (ref 12.0–15.0)
Immature Granulocytes: 0 %
Lymphocytes Relative: 5 %
Lymphs Abs: 0.2 10*3/uL — ABNORMAL LOW (ref 0.7–4.0)
MCH: 32.9 pg (ref 26.0–34.0)
MCHC: 29.8 g/dL — ABNORMAL LOW (ref 30.0–36.0)
MCV: 110.1 fL — ABNORMAL HIGH (ref 80.0–100.0)
Monocytes Absolute: 0.3 10*3/uL (ref 0.1–1.0)
Monocytes Relative: 6 %
Neutro Abs: 4.5 10*3/uL (ref 1.7–7.7)
Neutrophils Relative %: 89 %
Platelets: 168 10*3/uL (ref 150–400)
RBC: 2.77 MIL/uL — ABNORMAL LOW (ref 3.87–5.11)
RDW: 15.9 % — ABNORMAL HIGH (ref 11.5–15.5)
WBC: 5.1 10*3/uL (ref 4.0–10.5)
nRBC: 0 % (ref 0.0–0.2)

## 2021-05-10 LAB — CREATININE, SERUM
Creatinine, Ser: 4.59 mg/dL — ABNORMAL HIGH (ref 0.44–1.00)
GFR, Estimated: 10 mL/min — ABNORMAL LOW (ref >60)

## 2021-05-10 LAB — BRAIN NATRIURETIC PEPTIDE
B Natriuretic Peptide: >4500 pg/mL — ABNORMAL HIGH (ref 0.0–100.0)
B Natriuretic Peptide: >4500 pg/mL — ABNORMAL HIGH (ref 0.0–100.0)

## 2021-05-10 LAB — CBG MONITORING, ED
Glucose-Capillary: 204 mg/dL — ABNORMAL HIGH (ref 70–99)
Glucose-Capillary: 279 mg/dL — ABNORMAL HIGH (ref 70–99)

## 2021-05-10 LAB — TROPONIN I (HIGH SENSITIVITY): Troponin I (High Sensitivity): 46 ng/L — ABNORMAL HIGH (ref <18)

## 2021-05-10 LAB — GLUCOSE, CAPILLARY: Glucose-Capillary: 241 mg/dL — ABNORMAL HIGH (ref 70–99)

## 2021-05-10 LAB — PHOSPHORUS: Phosphorus: 5.8 mg/dL — ABNORMAL HIGH (ref 2.5–4.6)

## 2021-05-10 MED ORDER — INSULIN ASPART 100 UNIT/ML IV SOLN
5.0000 [IU] | Freq: Once | INTRAVENOUS | Status: AC
  Administered 2021-05-10: 5 [IU] via INTRAVENOUS

## 2021-05-10 MED ORDER — HEPARIN SODIUM (PORCINE) 5000 UNIT/ML IJ SOLN
5000.0000 [IU] | Freq: Two times a day (BID) | INTRAMUSCULAR | Status: DC
  Administered 2021-05-11 – 2021-05-15 (×11): 5000 [IU] via SUBCUTANEOUS
  Filled 2021-05-10 (×12): qty 1

## 2021-05-10 MED ORDER — INSULIN ASPART 100 UNIT/ML IJ SOLN
0.0000 [IU] | Freq: Three times a day (TID) | INTRAMUSCULAR | Status: DC
  Administered 2021-05-11: 1 [IU] via SUBCUTANEOUS
  Administered 2021-05-11 – 2021-05-12 (×2): 5 [IU] via SUBCUTANEOUS
  Administered 2021-05-12 – 2021-05-13 (×2): 2 [IU] via SUBCUTANEOUS
  Administered 2021-05-13 (×2): 1 [IU] via SUBCUTANEOUS
  Administered 2021-05-14: 3 [IU] via SUBCUTANEOUS
  Administered 2021-05-14 – 2021-05-15 (×5): 2 [IU] via SUBCUTANEOUS
  Administered 2021-05-16: 1 [IU] via SUBCUTANEOUS
  Administered 2021-05-16 (×2): 2 [IU] via SUBCUTANEOUS
  Administered 2021-05-17: 1 [IU] via SUBCUTANEOUS
  Administered 2021-05-17 – 2021-05-18 (×2): 2 [IU] via SUBCUTANEOUS
  Administered 2021-05-18: 1 [IU] via SUBCUTANEOUS
  Administered 2021-05-19 (×2): 3 [IU] via SUBCUTANEOUS
  Administered 2021-05-20 – 2021-05-21 (×3): 2 [IU] via SUBCUTANEOUS
  Administered 2021-05-21: 1 [IU] via SUBCUTANEOUS

## 2021-05-10 MED ORDER — ONDANSETRON HCL 4 MG/2ML IJ SOLN: 4.0000 mg | Freq: Four times a day (QID) | INTRAMUSCULAR | Status: DC | PRN

## 2021-05-10 MED ORDER — ASPIRIN 81 MG PO CHEW
81.0000 mg | CHEWABLE_TABLET | Freq: Every day | ORAL | Status: DC
  Administered 2021-05-11 – 2021-05-22 (×12): 81 mg via ORAL
  Filled 2021-05-10 (×12): qty 1

## 2021-05-10 MED ORDER — ACETAMINOPHEN 650 MG RE SUPP: 650.0000 mg | Freq: Four times a day (QID) | RECTAL | Status: DC | PRN

## 2021-05-10 MED ORDER — DEXTROSE 50 % IV SOLN: 50.0000 mL | Freq: Once | INTRAVENOUS | Status: DC

## 2021-05-10 MED ORDER — DEXTROSE 50 % IV SOLN
1.0000 | Freq: Once | INTRAVENOUS | Status: AC
  Administered 2021-05-10: 50 mL via INTRAVENOUS
  Filled 2021-05-10: qty 50

## 2021-05-10 MED ORDER — ISOSORB DINITRATE-HYDRALAZINE 20-37.5 MG PO TABS
1.0000 | ORAL_TABLET | Freq: Three times a day (TID) | ORAL | Status: DC
  Administered 2021-05-11 – 2021-05-19 (×25): 1 via ORAL
  Filled 2021-05-10 (×26): qty 1

## 2021-05-10 MED ORDER — DEXTROSE 50 % IV SOLN: 25.0000 g | Freq: Once | INTRAVENOUS | Status: DC

## 2021-05-10 MED ORDER — ACETAMINOPHEN 325 MG PO TABS
650.0000 mg | ORAL_TABLET | Freq: Four times a day (QID) | ORAL | Status: DC | PRN
  Administered 2021-05-21: 650 mg via ORAL
  Filled 2021-05-10: qty 2

## 2021-05-10 MED ORDER — LACTULOSE 10 GM/15ML PO SOLN
10.0000 g | Freq: Every day | ORAL | Status: DC
  Administered 2021-05-11 – 2021-05-22 (×13): 10 g via ORAL
  Filled 2021-05-10 (×13): qty 15

## 2021-05-10 MED ORDER — ATORVASTATIN CALCIUM 40 MG PO TABS
40.0000 mg | ORAL_TABLET | Freq: Every day | ORAL | Status: DC
  Administered 2021-05-11 – 2021-05-21 (×11): 40 mg via ORAL
  Filled 2021-05-10 (×11): qty 1

## 2021-05-10 MED ORDER — CALCITRIOL 0.25 MCG PO CAPS
0.2500 ug | ORAL_CAPSULE | Freq: Every day | ORAL | Status: DC
  Administered 2021-05-11 – 2021-05-22 (×11): 0.25 ug via ORAL
  Filled 2021-05-10 (×12): qty 1

## 2021-05-10 NOTE — Telephone Encounter (Signed)
Called and spoke with patient. Instructed her to hold her torsemide and metolazone and go to the ED due to worsening kidney function and elevated potassium. She verbalized understanding.  Allena Katz, FNP-BC

## 2021-05-10 NOTE — Addendum Note (Signed)
Encounter addended by: Rafael Bihari, FNP on: 04/22/2021 10:26 PM  Actions taken: Clinical Note Signed

## 2021-05-10 NOTE — Progress Notes (Signed)
ReDS Vest / Clip - 04/17/2021 0900       ReDS Vest / Clip   Station Marker A    Ruler Value 23    ReDS Value Range Moderate volume overload    ReDS Actual Value 38

## 2021-05-10 NOTE — ED Provider Notes (Signed)
[facility] EMERGENCY DEPARTMENT Provider Note   CSN: [id number] Arrival date & time: 04/23/2021  1501     History Chief Complaint  Patient presents with   Abnormal Labs    R/T kidney function    Beverly Martin is a 65 y.o. female.  HPI  65 year old female PMHx CAD, T2DM, HTN, HLD, CKD, CHF (2 LPM O2 at home), referred from cardiology after being seen in the clinic earlier this morning for AKI and hyperkalemia.  She reports chest tightness that was gradual onset over the last 2 to 3 weeks, mild severity, midsternal, nonradiating, intermittent, no modifying factors, not worsened with exertion.  Associate symptoms include slight shortness of breath and mild worsening of baseline pedal edema, although she has not had to increase her home oxygen.  No further medical concern at this time including fevers, chills, diaphoresis, sore throat, rhinorrhea, cough, palpitations, bowel/bladder changes, focal weakness/paresthesias, headache, audiovisual change, new rash.  History obtained from patient, chart review.  Past Medical History:  Diagnosis Date   CAD (coronary artery disease)    Chronic kidney disease (CKD) stage G4/A1, severely decreased glomerular filtration rate (GFR) between 15-29 mL/min/1.73 square meter and albuminuria creatinine ratio less than 30 mg/g (HCC)    Combined systolic and diastolic ACC/AHA stage C congestive heart failure ([place])    Diabetes mellitus without complication ([place])    Essential hypertension    Hyperlipidemia     Patient Active Problem List   Diagnosis Date Noted   Acute on chronic combined systolic (congestive) and diastolic (congestive) heart failure ([place]) 04/24/2021   Hyperkalemia 04/24/2021   Macrocytic anemia 04/24/2021   Thrombocytopenia ([place]) 04/24/2021   Hyperglycemia    Acute on chronic combined systolic and diastolic CHF, NYHA class 3 ([place]) [id number]   Chronic systolic heart failure ([place]) 01/16/2021   CKD (chronic kidney  disease) stage 4, GFR 15-29 ml/min (HCC) 01/04/2021   CHF (congestive heart failure) ([place]) [id number]   Acute systolic heart failure ([place]) 06/29/2020   Acute CHF (congestive heart failure) ([place]) 06/10/2020   Anasarca 06/10/2020   Dyslipidemia 06/10/2020   Positive blood culture 10/21/2017   UTI (urinary tract infection) 10/20/2017   Sepsis ([place]) 10/20/2017   Acute kidney injury superimposed on chronic kidney disease ([place]) 10/20/2017   Elevated troponin 10/20/2017   Essential hypertension    Type 2 diabetes mellitus with hyperlipidemia (HCC)    CAD (coronary artery disease)     Past Surgical History:  Procedure Laterality Date   CARDIAC SURGERY     CABG   CORONARY ARTERY BYPASS GRAFT     RIGHT HEART CATH N/A 01/18/2021   Procedure: RIGHT HEART CATH;  Surgeon: Jolaine Artist, MD;  Location: [facility];  Service: Cardiovascular;  Laterality: N/A;     OB History   No obstetric history on file.     Family History  Problem Relation Age of Onset   Diabetes Mellitus II Mother    Esophageal cancer Father     Social History   Tobacco Use   Smoking status: Never   Smokeless tobacco: Never  Vaping Use   Vaping Use: Never used  Substance Use Topics   Alcohol use: No   Drug use: No    Home Medications Prior to Admission medications   Medication Sig Start Date End Date Taking? Authorizing Provider  aspirin EC 81 MG EC tablet Take 1 tablet (81 mg total) by mouth daily. Swallow whole. 06/19/20 06/14/21 Yes Kayleen Memos, DO  atorvastatin (LIPITOR) 40 MG tablet Take 1 tablet (40 mg total) by mouth daily. 04/09/21  Yes Billie Ruddy, MD  calcitRIOL (ROCALTROL) 0.25 MCG capsule TAKE 1 CAPSULE BY MOUTH EVERY DAY Patient taking differently: Take 0.25 mcg by mouth daily. 04/03/21  Yes Billie Ruddy, MD  isosorbide-hydrALAZINE (BIDIL) 20-37.5 MG tablet Take 1 tablet by mouth 3 (three) times daily. 04/30/21 05/30/21 Yes Arrien, Jimmy Picket, MD  lactulose (CHRONULAC) 10  GM/15ML solution Take 15 mLs (10 g total) by mouth daily for 5 days. 05/09/21 05/14/21 Yes Billie Ruddy, MD  metolazone (ZAROXOLYN) 2.5 MG tablet Take 1 tablet (2.5 mg total) by mouth 2 (two) times a week. Take one tablet on Monday and one tablet on Friday 05/02/21  Yes Arrien, Jimmy Picket, MD  potassium chloride SA (KLOR-CON) 20 MEQ tablet Take 2 tablets (40 mEq total) by mouth 2 (two) times a week. Take 2 tablets on Monday and 2 tablets on Friday (with motolazone) 05/02/21  Yes Arrien, Jimmy Picket, MD  torsemide (DEMADEX) 20 MG tablet Take 1 tablet (20 mg total) by mouth every evening. Patient taking differently: Take 40 mg by mouth daily. 04/30/21  Yes Arrien, Jimmy Picket, MD  Accu-Chek Softclix Lancets lancets Use as instructed Patient not taking: Reported on 04/16/2021 05/08/21   Billie Ruddy, MD  torsemide 40 MG TABS Take 40 mg by mouth every morning. Patient not taking: Reported on 04/26/2021 05/01/21   Arrien, Jimmy Picket, MD    Allergies    Patient has no known allergies.  Review of Systems   Review of Systems  All other systems reviewed and are negative.  Physical Exam Updated Vital Signs BP 127/66 (BP Location: Right Arm)   Pulse 79   Temp 97.9 F (36.6 C) (Oral)   Resp 20   Ht '5\' 5"'$  (1.651 m)   Wt 49.9 kg   SpO2 99%   BMI 18.32 kg/m   Physical Exam Vitals and nursing note reviewed.  Constitutional:      General: She is not in acute distress. HENT:     Head: Normocephalic and atraumatic.     Nose: Nose normal.     Mouth/Throat:     Mouth: Mucous membranes are moist.     Pharynx: Oropharynx is clear.  Eyes:     Extraocular Movements: Extraocular movements intact.     Conjunctiva/sclera: Conjunctivae normal.     Pupils: Pupils are equal, round, and reactive to light.  Cardiovascular:     Rate and Rhythm: Normal rate.     Heart sounds: Murmur heard.    No friction rub. No gallop.  Pulmonary:     Effort: Pulmonary effort is normal.     Breath  sounds: No stridor. No wheezing, rhonchi or rales.  Abdominal:     General: There is no distension.     Palpations: Abdomen is soft.     Tenderness: There is no abdominal tenderness. There is no guarding or rebound.  Musculoskeletal:     Cervical back: Normal range of motion. No rigidity.     Right lower leg: Edema present.     Left lower leg: Edema present.  Skin:    General: Skin is warm and dry.  Neurological:     Mental Status: She is alert and oriented to person, place, and time. Mental status is at baseline.  Psychiatric:        Mood and Affect: Mood normal.        Behavior: Behavior normal.  ED Results / Procedures / Treatments   Labs (all labs ordered are listed, but only abnormal results are displayed) Labs Reviewed  CBC WITH DIFFERENTIAL/PLATELET - Abnormal; Notable for the following components:      Result Value   RBC 2.77 (*)    Hemoglobin 9.1 (*)    HCT 30.5 (*)    MCV 110.1 (*)    MCHC 29.8 (*)    RDW 15.9 (*)    Lymphs Abs 0.2 (*)    All other components within normal limits  BASIC METABOLIC PANEL - Abnormal; Notable for the following components:   Potassium 5.5 (*)    Chloride 89 (*)    CO2 44 (*)    Glucose, Bld 238 (*)    BUN 111 (*)    Creatinine, Ser 4.61 (*)    GFR, Estimated 10 (*)    All other components within normal limits  BRAIN NATRIURETIC PEPTIDE - Abnormal; Notable for the following components:   B Natriuretic Peptide >4,500.0 (*)    All other components within normal limits  BRAIN NATRIURETIC PEPTIDE - Abnormal; Notable for the following components:   B Natriuretic Peptide >4,500.0 (*)    All other components within normal limits  COMPREHENSIVE METABOLIC PANEL - Abnormal; Notable for the following components:   Potassium 5.6 (*)    Chloride 88 (*)    CO2 43 (*)    Glucose, Bld 273 (*)    BUN 108 (*)    Creatinine, Ser 4.65 (*)    AST 13 (*)    GFR, Estimated 10 (*)    All other components within normal limits  MAGNESIUM -  Abnormal; Notable for the following components:   Magnesium 2.8 (*)    All other components within normal limits  CBC WITH DIFFERENTIAL/PLATELET - Abnormal; Notable for the following components:   RBC 2.77 (*)    Hemoglobin 9.1 (*)    HCT 30.1 (*)    MCV 108.7 (*)    RDW 15.8 (*)    Platelets 148 (*)    Lymphs Abs 0.2 (*)    All other components within normal limits  HEMOGLOBIN A1C - Abnormal; Notable for the following components:   Hgb A1c MFr Bld 7.5 (*)    All other components within normal limits  CBC - Abnormal; Notable for the following components:   RBC 2.69 (*)    Hemoglobin 8.7 (*)    HCT 29.6 (*)    MCV 110.0 (*)    MCHC 29.4 (*)    RDW 15.9 (*)    Platelets 148 (*)    All other components within normal limits  CREATININE, SERUM - Abnormal; Notable for the following components:   Creatinine, Ser 4.59 (*)    GFR, Estimated 10 (*)    All other components within normal limits  PHOSPHORUS - Abnormal; Notable for the following components:   Phosphorus 5.8 (*)    All other components within normal limits  GLUCOSE, CAPILLARY - Abnormal; Notable for the following components:   Glucose-Capillary 241 (*)    All other components within normal limits  CBG MONITORING, ED - Abnormal; Notable for the following components:   Glucose-Capillary 204 (*)    All other components within normal limits  CBG MONITORING, ED - Abnormal; Notable for the following components:   Glucose-Capillary 279 (*)    All other components within normal limits  TROPONIN I (HIGH SENSITIVITY) - Abnormal; Notable for the following components:   Troponin I (High Sensitivity) 46 (*)  All other components within normal limits  RESP PANEL BY RT-PCR (FLU A&B, COVID) ARPGX2  SODIUM, URINE, RANDOM  CREATININE, URINE, RANDOM    EKG None  Radiology DG Chest Portable 1 View  Result Date: 05/02/2021 CLINICAL DATA:  Chest tightness. EXAM: PORTABLE CHEST 1 VIEW COMPARISON:  PA and lateral chest 04/23/2021.  FINDINGS: There is cardiomegaly without edema. Small bilateral pleural effusions and basilar atelectasis are worse on the left and unchanged. No pneumothorax. The patient is status post CABG. No acute or focal abnormality. IMPRESSION: No change in left greater than right small pleural effusions and basilar atelectasis. Cardiomegaly without edema. Electronically Signed   By: Inge Rise M.D.   On: 05/03/2021 17:48    Procedures Procedures   Medications Ordered in ED Medications  atorvastatin (LIPITOR) tablet 40 mg (has no administration in time range)  isosorbide-hydrALAZINE (BIDIL) 20-37.5 MG per tablet 1 tablet (1 tablet Oral Given 05/11/21 0124)  calcitRIOL (ROCALTROL) capsule 0.25 mcg (has no administration in time range)  lactulose (CHRONULAC) 10 GM/15ML solution 10 g (10 g Oral Given 05/11/21 0124)  aspirin chewable tablet 81 mg (has no administration in time range)  acetaminophen (TYLENOL) tablet 650 mg (has no administration in time range)    Or  acetaminophen (TYLENOL) suppository 650 mg (has no administration in time range)  heparin injection 5,000 Units (5,000 Units Subcutaneous Given 05/11/21 0124)  insulin aspart (novoLOG) injection 0-9 Units (has no administration in time range)  ondansetron (ZOFRAN) injection 4 mg (has no administration in time range)  insulin aspart (novoLOG) injection 5 Units (5 Units Intravenous Given 04/27/2021 1804)    And  dextrose 50 % solution 50 mL (50 mLs Intravenous Given 04/18/2021 1805)    ED Course  I have reviewed the triage vital signs and the nursing notes.  Pertinent labs & imaging results that were available during my care of the patient were reviewed by me and considered in my medical decision making (see chart for details).    MDM Rules/Calculators/A&P                         This is a 65 year old female PMHx CHF, T2DM, HTN, HLD, CKD, CAD with prior CABG, referred for hyperkalemia and AKI.  She is HDS, afebrile, noted to have bilateral  pitting edema, but no adventitious breath sounds.  Remainder of exam benign.  Initial interventions: NovoLog 5 units with 50 mL D50 administered for mild hyperkalemia  All studies independently reviewed by myself, d/w the attending physician, factored into my MDM. -EKG: NSR 91 bpm, LVH with borderline right axis deviation, otherwise normal intervals, no acute ischemic changes; essentially unchanged compared to prior from 04/25/2021 -CXR: L >R small pleural effusions and basilar atelectasis, cardiomegaly with edema -CMP: K+ 5.5 (6.2 3 days ago), BGL 238, creatinine 4.61 (3.59 11 days ago), BUN 111 -BNP: >4500 (has been this value for at least the last 4 months) -Troponin: 46 (222 2 weeks ago) -Unremarkable: CBCd, COVID/influenza PCR  Presentation appears most consistent with AKI and resolving hyperkalemia in context of chronic significant CHF, significant cardiovascular comorbidities, T2DM.  No significant findings on EKG or troponin elevation from baseline to suggest ACS, carditis, or arrhythmia.  Less likely PE, not hypoxic, not tachycardic, no pleuritic chest pain.  Less likely dissection, no FND, no pulse differences, no tearing or posteriorly radiating CP.  CXR without evidence of pneumonia, PTX, or pneumoperitoneum.  No other significant electrolyte derangement noted on metabolic panel.  No  significant leukocytosis to suggest septic picture.  Patient is alert and oriented, does not appear encephalopathic.  Given her numerous comorbidities and degree of AKI in context of significant CHF, feel patient requires admission.  Discussed with nephrology and hospital service.  Hospitalist service agreed to admit.  Plan discussed with patient who understands and agrees.  Patient HDS on reevaluation and subsequently admitted.  Final Clinical Impression(s) / ED Diagnoses Final diagnoses:  AKI (acute kidney injury) ([place])  Hyperkalemia  Chest tightness    Rx / DC Orders ED Discharge Orders     None         Levin Bacon, MD 05/11/21 [id number]    Malvin Johns, MD 05/11/21 1107

## 2021-05-10 NOTE — Patient Instructions (Signed)
It was great to see you today! No medication changes are needed at this time.  Labs today We will only contact you if something comes back abnormal or we need to make some changes. Otherwise no news is good news!  Keep follow up as scheduled  Do the following things EVERYDAY: Weigh yourself in the morning before breakfast. Write it down and keep it in a log. Take your medicines as prescribed Eat low salt foods--Limit salt (sodium) to 2000 mg per day.  Stay as active as you can everyday Limit all fluids for the day to less than 2 liters  milAt the [facility], you and your health needs are our priority. As part of our continuing mission to provide you with exceptional heart care, we have created designated Provider Care Teams. These Care Teams include your primary Cardiologist (physician) and Advanced Practice Providers (APPs- Physician Assistants and Nurse Practitioners) who all work together to provide you with the care you need, when you need it.   You may see any of the following providers on your designated Care Team at your next follow up: Dr Glori Bickers Dr Loralie Champagne Dr Patrice Paradise, NP Lyda Jester, [place] Ginnie Smart Audry Riles, PharmD   Please be sure to bring in all your medications bottles to every appointment.

## 2021-05-10 NOTE — ED Notes (Signed)
Due to pt position, husband to sign MSE form

## 2021-05-10 NOTE — ED Notes (Signed)
Provided pt w/ bag lunch and beverage

## 2021-05-10 NOTE — Consult Note (Signed)
[facility] Progress Note    Reason for Consult: Renal failure Referring Physician: Dr. Dwyane Dee    Chief Complaint:  Abnormal labs  Assessment/Plan  AKI on CKD IV: Baseline sCr was in the 2.9-3.1 range recently and she was d/c w/ Cr of 3.6 a week ago; h/o cardiorenal syndrome in setting of CKD secondary to hypertension and DM. Renal US 7/13 without obstruction consistent with chronic renal disease. Subnephrotic proteinuria secondary to diabetes mellitus; RAAS blockade/SGLT2- use not possible with current renal function. Has had recent vein mapping; no indication for emergent RRT at this time; however, does appear to be headed towards requiring HD. I'm not convinced she would tolerate dialysis with her cardiac status. - Agree with holding the diuretics for now; hopefully she's just a little prerenal. Again, just not convinced she will tolerate HD which is where she's headed. Appears to have a new BL creatinine after the last admission in July 2022. - Strict I&O's with daily weights. - Urine studies and check bladder scan. - Certainly has some edema but she states usually it's much worse.  - Will continue to monitor volume status.  - She has an outpt appt with Dr. Justin Mend 8/16 230PM  Acute on chronic combined systolic and diastolic heart failure: suspected to be mixed ischemic and non-ischemic cardiomyopathy complicated by medication nonadherence in the past.  Metabolic alkalosis: Chronic, not a good candidate for diamox due to compromised renal function. No need for urgent RRT at this time. Will continue to trend.  Macrocytic anemia w/hx of normocytic anemia: Iron replete. Vitamin B12 and folate wnl. Will likely need to continue Aranesp as outpatient.  Type II DM: insulin dosing per primary team    HPI: 65 y.o. female with PMHx of CAD s/p CABG in [id number], combined systolic and diastolic heart failure (EF 20-25%), type II diabetes mellitus, hypertension, CKD IV (baseline sCr  2.9-3.2), and hyperlipidemia with multiple recent admissions for acute on chronic systolic HF and following in the [facility] HF clinic presented with concerns of worsening labs. She was just in the hospital and responded nicely to diuretics leaving  at 111.8 lbs. When she left the hospital on 04/30/2021 her Cr was 3.6. She's presenting with abnormal labs and was noted to have a Cr of 4.61 with hyperkalemia and a  CXR showing b/l pleural effusions. She was treated with 5 units of insulin. She is on torsemide (40/20 mg am/pm)  and metolazone (2.'5mg'$  on Mon and Fri) but was told by her PCP to discontinue the diuretics today and come to the ED for evaluation.  She denies any dysuria, decreased UOP or worsening dyspnea; she denies orthopnea but prefers to sleep with some incline because of habit. She has had orthopnea in the past.    Objective Vitals:   04/27/2021 1630 04/25/2021 1645 05/01/2021 1830 05/05/2021 1925  BP: (!) 145/74  (!) 144/74 (!) 147/80  Pulse: 90 90 93 86  Resp: 10 19 (!) 21 17  Temp:    97.7 F (36.5 C)  TempSrc:    Oral  SpO2: 100% 100% 100% 100%     Additional Objective Labs: Basic Metabolic Panel: Recent Labs  Lab 05/09/21 0854 04/26/2021 1024 04/18/2021 1557  NA 142 143 141  K 5.8* 5.7* 5.5*  CL 90* 90* 89*  CO2 44* 45* 44*  GLUCOSE 177* 214* 238*  BUN 113* 112* 111*  CREATININE 4.60* 4.65* 4.61*  CALCIUM 9.9 9.7 9.6   Liver Function Tests: Recent Labs  Lab 05/08/21 1307  05/09/21 0854  AST 11 11  ALT 17 16  ALKPHOS 82 80  BILITOT 0.6 0.6  PROT 7.2 6.8  ALBUMIN 4.3 4.2   No results for input(s): LIPASE, AMYLASE in the last 168 hours. CBC: Recent Labs  Lab 05/08/21 1307 05/01/2021 1557  WBC 5.2 5.1  NEUTROABS 4.7 4.5  HGB 8.3* 9.1*  HCT 25.4* 30.5*  MCV 101.3* 110.1*  PLT 159.0 168   Blood Culture    Component Value Date/Time   SDES URINE, CLEAN CATCH 04/26/2021 1404   SPECREQUEST  04/26/2021 1404    NONE Performed at [facility], [place] [address], [place], [place] [address]    CULT 80,000 COLONIES/mL KLEBSIELLA PNEUMONIAE (A) 04/26/2021 1404   REPTSTATUS 04/28/2021 FINAL 04/26/2021 1404    Cardiac Enzymes: No results for input(s): CKTOTAL, CKMB, CKMBINDEX, TROPONINI in the last 168 hours. CBG: Recent Labs  Lab 04/14/2021 1733 04/12/2021 1832  GLUCAP 204* 279*   Studies/Results: DG Chest Portable 1 View  Result Date: 04/18/2021 CLINICAL DATA:  Chest tightness. EXAM: PORTABLE CHEST 1 VIEW COMPARISON:  PA and lateral chest 04/23/2021. FINDINGS: There is cardiomegaly without edema. Small bilateral pleural effusions and basilar atelectasis are worse on the left and unchanged. No pneumothorax. The patient is status post CABG. No acute or focal abnormality. IMPRESSION: No change in left greater than right small pleural effusions and basilar atelectasis. Cardiomegaly without edema. Electronically Signed   By: Inge Rise M.D.   On: 04/16/2021 17:48   Medications:    aspirin  81 mg Oral Daily   atorvastatin  40 mg Oral Daily   calcitRIOL  0.25 mcg Oral Daily   heparin  5,000 Units Subcutaneous BID   [START ON 05/11/2021] insulin aspart  0-9 Units Subcutaneous TID WC   isosorbide-hydrALAZINE  1 tablet Oral TID   lactulose  10 g Oral Daily    Physical Exam General:  Elderly appearing  HEENT: normal Neck: supple. JVP 3" above clavicle ear. No bruits. No lymphadenopathy or thryomegaly appreciated. Cor: PMI nondisplaced. Regular rate & rhythm.  Lungs: +nocrackles but very poor air movement Abdomen: soft, nontender, nondistended. No hepatosplenomegaly. No bruits or masses. Good bowel sounds. Extremities: no cyanosis, clubbing, rash, tr edema Neuro: alert & orientedx3, cranial nerves grossly intact. moves all 4 extremities w/o difficulty.

## 2021-05-10 NOTE — ED Triage Notes (Signed)
Pt was at [facility] this AM, was called this afternoon & told labs were abnormal related to kidney function. Pt has hx of DM, bypass, CAD, CHF, CKD4. Pt on 2L [place] at baseline.

## 2021-05-10 NOTE — Telephone Encounter (Signed)
Denise from [facility] called to let Dr. Volanda Napoleon know that her blood sugar was 380 last night and 362 this morning. Langley Gauss wanted to know if you want to increase her Lantus.  She was also told that her Potassium is high and she wants to know if they should hold off the Potassium that she take on Mondays and Fridays?  [facility] [phone]

## 2021-05-10 NOTE — H&P (Addendum)
History and Physical    Beverly Martin [id number] DOB: 1956-02-22 DOA: 05/09/2021  PCP: Billie Ruddy, MD patient sees Allena Katz nurse practitioner [facility] Patient coming from: home  I have personally briefly reviewed patient's old medical records in [place]  Chief Complaint: abnormal labs  HPI: Beverly Martin is a 65 y.o. female with medical history significant for systolic heart failure, diabetes, coronary disease status post CABG, hypertension, hyperlipidemia CKD 4/5 presents to the ED from PCP office shortly after returning home for hyperkalemia and elevated creatinine.  Patient had echo in March showing EF of 20 to 25%.  Blood pressure initially 145/74 heart rate 90s.  Sats are within normal limits on 2 L.  BUN/creatinine eleven 4.61 GFR 10.  Labs from 04/30/2021 BUN of 85 creatinine 3.59 GFR 13 at that time.  Chest x-ray left greater than right small pleural effusions basilar atelectasis.  EKG normal sinus rhythm.  Troponin 46 BNP 4500.  Patient given 5 units of insulin.  Glucose was 238.  Patient discussed with ED provider that she been having some shortness of breath and some chest tightness but has been intermittently for 2 to 3 weeks with increased pedal edema over the last week.  On my exam patient denies any dyspnea states she is on 2 L of oxygen chronically secondary to congestive heart failure.  Of note patient has been on furosemide and metolazone and will was called today by her PCP to discontinue use of these medications.  Patient was encouraged to come to the ED to be evaluated.  Patient endorses some intermittent leg edema as well.  Patient endorses some generalized weakness over the last week.  Denies focal deficits.  ED Course: Patient given insulin in the ED due to elevated blood sugar.  Review of Systems: As per HPI otherwise all other systems reviewed and are negative. 10 point review system otherwise negative listed in  HPI.  Past Medical History:  Diagnosis Date   CAD (coronary artery disease)    Combined systolic and diastolic ACC/AHA stage C congestive heart failure ([place])    Diabetes mellitus without complication ([place])    Essential hypertension    Hyperlipidemia     Past Surgical History:  Procedure Laterality Date   CARDIAC SURGERY     CABG   CORONARY ARTERY BYPASS GRAFT     RIGHT HEART CATH N/A 01/18/2021   Procedure: RIGHT HEART CATH;  Surgeon: Jolaine Artist, MD;  Location: [facility];  Service: Cardiovascular;  Laterality: N/A;    Social History  reports that she has never smoked. She has never used smokeless tobacco. She reports that she does not drink alcohol and does not use drugs.  No Known Allergies  Family History  Problem Relation Age of Onset   Diabetes Mellitus II Mother    Esophageal cancer Father     Prior to Admission medications   Medication Sig Start Date End Date Taking? Authorizing Provider  aspirin EC 81 MG EC tablet Take 1 tablet (81 mg total) by mouth daily. Swallow whole. 06/19/20 06/14/21 Yes Hall, Carole N, DO  atorvastatin (LIPITOR) 40 MG tablet Take 1 tablet (40 mg total) by mouth daily. 04/09/21  Yes Billie Ruddy, MD  calcitRIOL (ROCALTROL) 0.25 MCG capsule TAKE 1 CAPSULE BY MOUTH EVERY DAY Patient taking differently: Take 0.25 mcg by mouth daily. 04/03/21  Yes Billie Ruddy, MD  isosorbide-hydrALAZINE (BIDIL) 20-37.5 MG tablet Take 1 tablet by  mouth 3 (three) times daily. 04/30/21 05/30/21 Yes Arrien, Jimmy Picket, MD  lactulose (CHRONULAC) 10 GM/15ML solution Take 15 mLs (10 g total) by mouth daily for 5 days. 05/09/21 05/14/21 Yes Billie Ruddy, MD  metolazone (ZAROXOLYN) 2.5 MG tablet Take 1 tablet (2.5 mg total) by mouth 2 (two) times a week. Take one tablet on Monday and one tablet on Friday 05/02/21  Yes Arrien, Jimmy Picket, MD  potassium chloride SA (KLOR-CON) 20 MEQ tablet Take 2 tablets (40 mEq total) by mouth 2 (two) times a week.  Take 2 tablets on Monday and 2 tablets on Friday (with motolazone) 05/02/21  Yes Arrien, Jimmy Picket, MD  torsemide (DEMADEX) 20 MG tablet Take 1 tablet (20 mg total) by mouth every evening. Patient taking differently: Take 40 mg by mouth daily. 04/30/21  Yes Arrien, Jimmy Picket, MD  Accu-Chek Softclix Lancets lancets Use as instructed Patient not taking: Reported on 04/12/2021 05/08/21   Billie Ruddy, MD  torsemide 40 MG TABS Take 40 mg by mouth every morning. Patient not taking: Reported on 04/16/2021 05/01/21   Tawni Millers, MD    Physical Exam: Vitals:   05/01/2021 1600 04/13/2021 1615 04/23/2021 1630 04/19/2021 1645  BP: (!) 147/75 (!) 145/81 (!) 145/74   Pulse: 85 89 90 90  Resp: (!) 33 (!) '23 10 19  '$ Temp:      TempSrc:      SpO2: 100% 100% 100% 100%    Constitutional: NAD, calm, comfortable Vitals:   04/15/2021 1600 04/18/2021 1615 04/27/2021 1630 04/30/2021 1645  BP: (!) 147/75 (!) 145/81 (!) 145/74   Pulse: 85 89 90 90  Resp: (!) 33 (!) '23 10 19  '$ Temp:      TempSrc:      SpO2: 100% 100% 100% 100%   Eyes: PERRL, lids and conjunctivae normal ENMT: Mucous membranes are moist. Posterior pharynx clear of any exudate or lesions.Normal dentition.  Neck: normal, supple, no masses, no thyromegaly Respiratory: Faint rales bilateral bases normal respiratory effort. No accessory muscle use.  Cardiovascular: Regular rate and rhythm, no murmurs / rubs / gallops. 1+ pedal pulses.  Abdomen: no tenderness, no masses palpated. No hepatosplenomegaly. Bowel sounds positive.  Musculoskeletal: Trace edema bilateral lower extremities no joint deformity upper and lower extremities. Good ROM, no contractures. Normal muscle tone.  Skin: no rashes, lesions, ulcers. No induration, well-healed bypass scar Neurologic: CN 2-12 grossly intact. Sensation intact, DTR normal. Strength 4/5 in all 4.  Psychiatric: Normal judgment and insight. Alert and oriented x 3. Normal mood.    Labs on Admission:  I have personally reviewed following labs and imaging studies  CBC: Recent Labs  Lab 05/08/21 1307 04/16/2021 1557  WBC 5.2 5.1  NEUTROABS 4.7 4.5  HGB 8.3* 9.1*  HCT 25.4* 30.5*  MCV 101.3* 110.1*  PLT 159.0 [id number]    Basic Metabolic Panel: Recent Labs  Lab 05/08/21 1307 05/09/21 0854 04/24/2021 1024 05/09/2021 1557  NA 139 142 143 141  K 6.2 No hemolysis seen* 5.8* 5.7* 5.5*  CL 88* 90* 90* 89*  CO2 43* 44* 45* 44*  GLUCOSE 367* 177* 214* 238*  BUN 117* 113* 112* 111*  CREATININE 4.48* 4.60* 4.65* 4.61*  CALCIUM 9.8 9.9 9.7 9.6  MG 2.9*  --   --   --     GFR: Estimated Creatinine Clearance: 9 mL/min (A) (by C-G formula based on SCr of 4.61 mg/dL (H)).  Liver Function Tests: Recent Labs  Lab 05/08/21 1307 05/09/21 [id number]  AST 11 11  ALT 17 16  ALKPHOS 82 80  BILITOT 0.6 0.6  PROT 7.2 6.8  ALBUMIN 4.3 4.2    Urine analysis:    Component Value Date/Time   COLORURINE YELLOW 04/25/2021 1211   APPEARANCEUR CLOUDY (A) 04/25/2021 1211   LABSPEC 1.015 04/25/2021 1211   PHURINE 5.5 04/25/2021 1211   GLUCOSEU 100 (A) 04/25/2021 1211   HGBUR TRACE (A) 04/25/2021 1211   BILIRUBINUR NEGATIVE 04/25/2021 1211   KETONESUR NEGATIVE 04/25/2021 1211   PROTEINUR 30 (A) 04/25/2021 1211   UROBILINOGEN 0.2 10/20/2017 1115   NITRITE NEGATIVE 04/25/2021 1211   LEUKOCYTESUR MODERATE (A) 04/25/2021 1211    Radiological Exams on Admission: DG Chest Portable 1 View  Result Date: 05/12/2021 CLINICAL DATA:  Chest tightness. EXAM: PORTABLE CHEST 1 VIEW COMPARISON:  PA and lateral chest 04/23/2021. FINDINGS: There is cardiomegaly without edema. Small bilateral pleural effusions and basilar atelectasis are worse on the left and unchanged. No pneumothorax. The patient is status post CABG. No acute or focal abnormality. IMPRESSION: No change in left greater than right small pleural effusions and basilar atelectasis. Cardiomegaly without edema. Electronically Signed   By: Inge Rise M.D.    On: 04/19/2021 17:48    EKG: Independently reviewed.   Assessment/Plan Principal Problem:   Hyperkalemia Active Problems:   Essential hypertension   Type 2 diabetes mellitus with hyperlipidemia (HCC)   CAD (coronary artery disease)   Elevated troponin   CKD (chronic kidney disease) stage 4, GFR 15-29 ml/min (HCC)   Chronic systolic heart failure (HCC)   Macrocytic anemia  65 year old female with a history of CKD 4, systolic heart failure, macrocytic anemia, diabetes type 2 and hyperlipidemia, CAD, hypertension presents today from her cardiology office: Telling her she needs to come in to be evaluated for worsening kidney function and elevated potassium.  Patient has had serial blood work last few days and on the 19th of this month BUN/creatinine were 85 and 3.59 GFR at that time was 13 today and today BUN/creatinine 112 4.65 and GFR in the 10 range.  Patient seems to be more consistent with CKD 5 than 4 according to recent lab over the last month.  initially potassium was 5.7 and repeat this afternoon is 5.5.  ED provider already discussed patient with nephrology awaiting consult. Hyperkalemia-secondary to home torsemide and metolazone which of inhaled along with worsening kidney function Continue telemetry, encourage p.o. and follow morning labs Follow-up nephrology consult Holding home potassium supplement which is contributing as well  CKD 4 which appears to be rescinded CKD 5-we will hold home diuretics and follow-up nephrology consult  Chronic systolic heart failure/bilateral pleural effusion small-not requiring more oxygen than normal currently 2 L at home no increased oxygen needs at this time  Macrocytic anemia-hemoglobin 9 usually is in the 8 range, currently stable Potentially component of volume contraction will encourage p.o. to reduced EF of 20%  CAD-continue home isosorbide, aspirin  Hyperlipidemia-continue Lipitor  Diabetes type 2-follow-up blood sugar checks and give  sliding scale insulin  Elevated troponin-due to CKD-patient denies any chest pain at this time Continue telemetry, trend troponins  Hypertension-continue BiDil  Disposition-observation-telemetry, continue to monitor may improve with holding metolazone and torsemide and encouraging p.o. Observation, patient is to be full code Patient denies URI complaints-COVID and flu test currently pending, PT consult-deconditioning/gen.weakness   DVT prophylaxis: Heparin Code Status:   Full  family Communication: na Disposition Plan:   Patient is from:  home  Anticipated DC to:  home  Anticipated DC date:  1day  Anticipated DC barriers: na  Consults called:   nephrology Admission status:  Obs, tele Severity of Illness: The appropriate patient status for this patient is OBSERVATION. Observation status is judged to be reasonable and necessary in order to provide the required intensity of service to ensure the patient's safety. The patient's presenting symptoms, physical exam findings, and initial radiographic and laboratory data in the context of their medical condition is felt to place them at decreased risk for further clinical deterioration. Furthermore, it is anticipated that the patient will be medically stable for discharge from the hospital within 2 midnights of admission. The following factors support the patient status of observation.   " The patient's presenting symptoms include abnormal labs, chronic sob, leg edema. " The physical exam findings include leg edema " The initial radiographic and laboratory data are elevated k, s.    Jacqlyn Krauss MD [facility] [id number]  04/21/2021, 6:33 PM

## 2021-05-10 NOTE — ED Provider Notes (Signed)
Emergency Medicine Provider Triage Evaluation Note  Beverly Martin , a 65 y.o. female  was evaluated in triage.  Pt complains of abnormal labs. Pt was at a routine visit pta and had labs drawn which showed an abnormal cr and hyperkalemia. Not on dialysis.  Review of Systems  Positive: Abnormal labs Negative: Nvd, fevers, chest pain, sob, urinary sxs  Physical Exam  BP (!) 145/76   Pulse 90   Temp 98.2 F (36.8 C) (Oral)   Resp 18   SpO2 100%  Gen:   Awake, no distress   Resp:  Normal effort  MSK:   Moves extremities without difficulty  Other:    Medical Decision Making  Medically screening exam initiated at 3:17 PM.  Appropriate orders placed.  Beverly Martin was informed that the remainder of the evaluation will be completed by another provider, this initial triage assessment does not replace that evaluation, and the importance of remaining in the ED until their evaluation is complete.     Beverly Booze, PA-C 04/22/2021 1520    Beverly Saver, MD 05/11/21 1254

## 2021-05-11 ENCOUNTER — Other Ambulatory Visit: Payer: Self-pay

## 2021-05-11 ENCOUNTER — Encounter (HOSPITAL_COMMUNITY): Payer: Self-pay | Admitting: Family Medicine

## 2021-05-11 DIAGNOSIS — N186 End stage renal disease: Secondary | ICD-10-CM | POA: Diagnosis present

## 2021-05-11 DIAGNOSIS — L89311 Pressure ulcer of right buttock, stage 1: Secondary | ICD-10-CM | POA: Diagnosis present

## 2021-05-11 DIAGNOSIS — Z66 Do not resuscitate: Secondary | ICD-10-CM | POA: Diagnosis not present

## 2021-05-11 DIAGNOSIS — I5082 Biventricular heart failure: Secondary | ICD-10-CM | POA: Diagnosis present

## 2021-05-11 DIAGNOSIS — Z681 Body mass index (BMI) 19 or less, adult: Secondary | ICD-10-CM | POA: Diagnosis not present

## 2021-05-11 DIAGNOSIS — N184 Chronic kidney disease, stage 4 (severe): Secondary | ICD-10-CM | POA: Diagnosis not present

## 2021-05-11 DIAGNOSIS — R0609 Other forms of dyspnea: Secondary | ICD-10-CM | POA: Diagnosis not present

## 2021-05-11 DIAGNOSIS — D539 Nutritional anemia, unspecified: Secondary | ICD-10-CM | POA: Diagnosis present

## 2021-05-11 DIAGNOSIS — E875 Hyperkalemia: Secondary | ICD-10-CM

## 2021-05-11 DIAGNOSIS — E1169 Type 2 diabetes mellitus with other specified complication: Secondary | ICD-10-CM | POA: Diagnosis present

## 2021-05-11 DIAGNOSIS — J9811 Atelectasis: Secondary | ICD-10-CM | POA: Diagnosis present

## 2021-05-11 DIAGNOSIS — I132 Hypertensive heart and chronic kidney disease with heart failure and with stage 5 chronic kidney disease, or end stage renal disease: Secondary | ICD-10-CM | POA: Diagnosis present

## 2021-05-11 DIAGNOSIS — I5022 Chronic systolic (congestive) heart failure: Secondary | ICD-10-CM | POA: Diagnosis not present

## 2021-05-11 DIAGNOSIS — E873 Alkalosis: Secondary | ICD-10-CM | POA: Diagnosis present

## 2021-05-11 DIAGNOSIS — N179 Acute kidney failure, unspecified: Secondary | ICD-10-CM | POA: Diagnosis present

## 2021-05-11 DIAGNOSIS — R5383 Other fatigue: Secondary | ICD-10-CM | POA: Diagnosis not present

## 2021-05-11 DIAGNOSIS — L8990 Pressure ulcer of unspecified site, unspecified stage: Secondary | ICD-10-CM | POA: Insufficient documentation

## 2021-05-11 DIAGNOSIS — D631 Anemia in chronic kidney disease: Secondary | ICD-10-CM | POA: Diagnosis present

## 2021-05-11 DIAGNOSIS — Z7189 Other specified counseling: Secondary | ICD-10-CM | POA: Diagnosis not present

## 2021-05-11 DIAGNOSIS — R64 Cachexia: Secondary | ICD-10-CM | POA: Diagnosis present

## 2021-05-11 DIAGNOSIS — Z992 Dependence on renal dialysis: Secondary | ICD-10-CM | POA: Diagnosis not present

## 2021-05-11 DIAGNOSIS — I428 Other cardiomyopathies: Secondary | ICD-10-CM | POA: Diagnosis present

## 2021-05-11 DIAGNOSIS — E1165 Type 2 diabetes mellitus with hyperglycemia: Secondary | ICD-10-CM | POA: Diagnosis present

## 2021-05-11 DIAGNOSIS — E1122 Type 2 diabetes mellitus with diabetic chronic kidney disease: Secondary | ICD-10-CM | POA: Diagnosis present

## 2021-05-11 DIAGNOSIS — D696 Thrombocytopenia, unspecified: Secondary | ICD-10-CM | POA: Diagnosis not present

## 2021-05-11 DIAGNOSIS — I5043 Acute on chronic combined systolic (congestive) and diastolic (congestive) heart failure: Secondary | ICD-10-CM | POA: Diagnosis not present

## 2021-05-11 DIAGNOSIS — Z20822 Contact with and (suspected) exposure to covid-19: Secondary | ICD-10-CM | POA: Diagnosis present

## 2021-05-11 DIAGNOSIS — E440 Moderate protein-calorie malnutrition: Secondary | ICD-10-CM | POA: Diagnosis not present

## 2021-05-11 DIAGNOSIS — Z515 Encounter for palliative care: Secondary | ICD-10-CM | POA: Diagnosis not present

## 2021-05-11 LAB — CBC WITH DIFFERENTIAL/PLATELET
Abs Immature Granulocytes: 0.02 10*3/uL (ref 0.00–0.07)
Basophils Absolute: 0 10*3/uL (ref 0.0–0.1)
Basophils Relative: 0 %
Eosinophils Absolute: 0 10*3/uL (ref 0.0–0.5)
Eosinophils Relative: 1 %
HCT: 30.1 % — ABNORMAL LOW (ref 36.0–46.0)
Hemoglobin: 9.1 g/dL — ABNORMAL LOW (ref 12.0–15.0)
Immature Granulocytes: 0 %
Lymphocytes Relative: 4 %
Lymphs Abs: 0.2 10*3/uL — ABNORMAL LOW (ref 0.7–4.0)
MCH: 32.9 pg (ref 26.0–34.0)
MCHC: 30.2 g/dL (ref 30.0–36.0)
MCV: 108.7 fL — ABNORMAL HIGH (ref 80.0–100.0)
Monocytes Absolute: 0.3 10*3/uL (ref 0.1–1.0)
Monocytes Relative: 6 %
Neutro Abs: 5.2 10*3/uL (ref 1.7–7.7)
Neutrophils Relative %: 89 %
Platelets: 148 10*3/uL — ABNORMAL LOW (ref 150–400)
RBC: 2.77 MIL/uL — ABNORMAL LOW (ref 3.87–5.11)
RDW: 15.8 % — ABNORMAL HIGH (ref 11.5–15.5)
WBC: 5.9 10*3/uL (ref 4.0–10.5)
nRBC: 0 % (ref 0.0–0.2)

## 2021-05-11 LAB — COMPREHENSIVE METABOLIC PANEL
ALT: 17 U/L (ref 0–44)
AST: 13 U/L — ABNORMAL LOW (ref 15–41)
Albumin: 3.7 g/dL (ref 3.5–5.0)
Alkaline Phosphatase: 68 U/L (ref 38–126)
Anion gap: 10 (ref 5–15)
BUN: 108 mg/dL — ABNORMAL HIGH (ref 8–23)
CO2: 43 mmol/L — ABNORMAL HIGH (ref 22–32)
Calcium: 9.6 mg/dL (ref 8.9–10.3)
Chloride: 88 mmol/L — ABNORMAL LOW (ref 98–111)
Creatinine, Ser: 4.65 mg/dL — ABNORMAL HIGH (ref 0.44–1.00)
GFR, Estimated: 10 mL/min — ABNORMAL LOW (ref >60)
Glucose, Bld: 273 mg/dL — ABNORMAL HIGH (ref 70–99)
Potassium: 5.6 mmol/L — ABNORMAL HIGH (ref 3.5–5.1)
Sodium: 141 mmol/L (ref 135–145)
Total Bilirubin: 1.1 mg/dL (ref 0.3–1.2)
Total Protein: 6.5 g/dL (ref 6.5–8.1)

## 2021-05-11 LAB — BASIC METABOLIC PANEL
Anion gap: 11 (ref 5–15)
BUN: 104 mg/dL — ABNORMAL HIGH (ref 8–23)
CO2: 39 mmol/L — ABNORMAL HIGH (ref 22–32)
Calcium: 9.4 mg/dL (ref 8.9–10.3)
Chloride: 90 mmol/L — ABNORMAL LOW (ref 98–111)
Creatinine, Ser: 4.21 mg/dL — ABNORMAL HIGH (ref 0.44–1.00)
GFR, Estimated: 11 mL/min — ABNORMAL LOW (ref >60)
Glucose, Bld: 139 mg/dL — ABNORMAL HIGH (ref 70–99)
Potassium: 5.7 mmol/L — ABNORMAL HIGH (ref 3.5–5.1)
Sodium: 140 mmol/L (ref 135–145)

## 2021-05-11 LAB — GLUCOSE, CAPILLARY
Glucose-Capillary: 252 mg/dL — ABNORMAL HIGH (ref 70–99)
Glucose-Capillary: 142 mg/dL — ABNORMAL HIGH (ref 70–99)
Glucose-Capillary: 86 mg/dL (ref 70–99)
Glucose-Capillary: 190 mg/dL — ABNORMAL HIGH (ref 70–99)

## 2021-05-11 LAB — CREATININE, URINE, RANDOM: Creatinine, Urine: 55.43 mg/dL

## 2021-05-11 LAB — SODIUM, URINE, RANDOM: Sodium, Ur: 61 mmol/L

## 2021-05-11 LAB — MAGNESIUM: Magnesium: 2.8 mg/dL — ABNORMAL HIGH (ref 1.7–2.4)

## 2021-05-11 LAB — HEMOGLOBIN A1C
Hgb A1c MFr Bld: 7.5 % — ABNORMAL HIGH (ref 4.8–5.6)
Mean Plasma Glucose: 168.55 mg/dL

## 2021-05-11 MED ORDER — SODIUM ZIRCONIUM CYCLOSILICATE 10 G PO PACK
10.0000 g | PACK | Freq: Two times a day (BID) | ORAL | Status: AC
  Administered 2021-05-11 (×2): 10 g via ORAL
  Filled 2021-05-11 (×2): qty 1

## 2021-05-11 MED ORDER — COVID-19 MRNA VACC (MODERNA) 50 MCG/0.25ML IM SUSP
0.2500 mL | Freq: Once | INTRAMUSCULAR | Status: AC
  Administered 2021-05-12: 0.25 mL via INTRAMUSCULAR
  Filled 2021-05-11: qty 0.25

## 2021-05-11 NOTE — Progress Notes (Signed)
PROGRESS NOTE    Beverly Martin  [id number] DOB: 1956/07/04 DOA: 04/30/2021 PCP: Billie Ruddy, MD    Chief Complaint  Patient presents with   Abnormal Labs    R/T kidney function    Brief Narrative:    Beverly Martin is a 65 y.o. female with medical history significant for systolic heart failure, diabetes, coronary disease status post CABG, hypertension, hyperlipidemia CKD 4/5 presents to the ED from PCP office shortly after returning home for hyperkalemia and elevated creatinine Assessment & Plan:   Principal Problem:   Hyperkalemia Active Problems:   Essential hypertension   Type 2 diabetes mellitus with hyperlipidemia (HCC)   CAD (coronary artery disease)   Elevated troponin   CKD (chronic kidney disease) stage 4, GFR 15-29 ml/min (HCC)   Chronic systolic heart failure (HCC)   Macrocytic anemia   Pressure injury of skin   AKI (acute kidney injury) ([place])    Acute on Stage 4 CKD:  - suspect this is her new baseline.  - nephrology on board and appreciate recommendations.  - monitor urine output and repeat renal parameters in am.     Chronic systolic and diastolic heart failure  Echocardiogram on 04/25/21 showed Left ventricular ejection fraction, by estimation, is 30 to 35%, with  moderately decreased function,  global hypokinesis and  mild left ventricular  hypertrophy. Left ventricular diastolic parameters are consistent with Grade II diastolic dysfunction. Right ventricular systolic functin is mod reduced with elevated pulm artery systolic pressure.  Lasix on hold for now for AKI . Continue with bidil.  Continue with strict intake and output.  Daily weights.     Hypertension;  Well controlled.    Type 2 dm with stage 4 KD - CBG (last 3)  Recent Labs    05/09/2021 2220 05/11/21 0739 05/11/21 1143  GLUCAP 241* 252* 142*   Resume SSI. Hemoglobin A1c is 9.1 in June 2022.   Hyperkalemia:  Lokelma ordered. Recheck BMP tonight.    Metabolic  alkalosis:  Monitor.     Anemia of chronic disease:  Hemoglobin between 8 to 9.  Transfuse to keep hemoglobin greater than 7.   Hyperlipidemia:  Continue with lipitor.    CAD:  No chest pain today. Resume aspirin and lipitor.    Stage 1 pressure injury on right buttock.  Pressure Injury 04/27/2021 Buttocks Right;Left Stage 1 -  Intact skin with non-blanchable redness of a localized area usually over a bony prominence. blanchable redness dressing applied to protect area (Active)  05/07/2021 2205  Location: Buttocks  Location Orientation: Right;Left  Staging: Stage 1 -  Intact skin with non-blanchable redness of a localized area usually over a bony prominence.  Wound Description (Comments): blanchable redness dressing applied to protect area  Present on Admission: Yes   Continue with foam dressing.      DVT prophylaxis: Heparin.  Code Status: full code.  Family Communication: none at bedside.  Disposition:   Status is: Inpatient  Remains inpatient appropriate because:Ongoing diagnostic testing needed not appropriate for outpatient work up, Unsafe d/c plan, and IV treatments appropriate due to intensity of illness or inability to take PO  Dispo: The patient is from: Home              Anticipated d/c is to: Home              Patient currently is not medically stable to d/c.   Difficult to place patient No       Consultants:  Nephrology.   Procedures: none.   Antimicrobials: none.    Subjective: No chest pain  , on 1-2 lit of [place] oxygen at home.  No palpitation.   Objective: Vitals:   05/11/21 0156 05/11/21 0512 05/11/21 0740 05/11/21 1144  BP: 127/66 133/72 139/74 130/89  Pulse: 79 86 83 89  Resp: '20 19 18 20  '$ Temp: 97.9 F (36.6 C) 97.8 F (36.6 C) 97.9 F (36.6 C) 98 F (36.7 C)  TempSrc: Oral Oral Oral Oral  SpO2: 99% 99% 97% 100%  Weight:  49.9 kg    Height:        Intake/Output Summary (Last 24 hours) at 05/11/2021 1326 Last data filed at  05/11/2021 0830 Gross per 24 hour  Intake 180 ml  Output 650 ml  Net -470 ml   Filed Weights   04/28/2021 2204 05/11/21 0512  Weight: 49.9 kg 49.9 kg    Examination:  General exam: Appears calm and comfortable.  Respiratory system: Clear to auscultation. Respiratory effort normal. On1 lit of NCoxygen.  Cardiovascular system: S1 & S2 heard, RRR. No JVD, No pedal edema. Gastrointestinal system: Abdomen is nondistended, soft and nontender.  Central nervous system: Alert and oriented. Twitches of the upper extremities.  Extremities: Symmetric 5 x 5 power. Skin: No rashes, lesions or ulcers Psychiatry:  Mood & affect appropriate.     Data Reviewed: I have personally reviewed following labs and imaging studies  CBC: Recent Labs  Lab 05/08/21 1307 04/29/2021 1557 04/13/2021 1855 05/11/21 0147  WBC 5.2 5.1 5.1 5.9  NEUTROABS 4.7 4.5  --  5.2  HGB 8.3* 9.1* 8.7* 9.1*  HCT 25.4* 30.5* 29.6* 30.1*  MCV 101.3* 110.1* 110.0* 108.7*  PLT 159.0 168 148* 148*    Basic Metabolic Panel: Recent Labs  Lab 05/08/21 1307 05/09/21 0854 04/21/2021 1024 04/19/2021 1557 04/18/2021 1855 05/11/21 0147  NA 139 142 143 141  --  141  K 6.2 No hemolysis seen* 5.8* 5.7* 5.5*  --  5.6*  CL 88* 90* 90* 89*  --  88*  CO2 43* 44* 45* 44*  --  43*  GLUCOSE 367* 177* 214* 238*  --  273*  BUN 117* 113* 112* 111*  --  108*  CREATININE 4.48* 4.60* 4.65* 4.61* 4.59* 4.65*  CALCIUM 9.8 9.9 9.7 9.6  --  9.6  MG 2.9*  --   --   --   --  2.8*  PHOS  --   --   --   --  5.8*  --     GFR: Estimated Creatinine Clearance: 9.4 mL/min (A) (by C-G formula based on SCr of 4.65 mg/dL (H)).  Liver Function Tests: Recent Labs  Lab 05/08/21 1307 05/09/21 0854 05/11/21 0147  AST 11 11 13*  ALT '17 16 17  '$ ALKPHOS 82 80 68  BILITOT 0.6 0.6 1.1  PROT 7.2 6.8 6.5  ALBUMIN 4.3 4.2 3.7    CBG: Recent Labs  Lab 04/12/2021 1733 04/30/2021 1832 04/28/2021 2220 05/11/21 0739 05/11/21 1143  GLUCAP 204* 279* 241* 252*  142*     Recent Results (from the past 240 hour(s))  Resp Panel by RT-PCR (Flu A&B, Covid) Nasopharyngeal Swab     Status: None   Collection Time: 04/18/2021  6:37 PM   Specimen: Nasopharyngeal Swab; Nasopharyngeal(NP) swabs in vial transport medium  Result Value Ref Range Status   SARS Coronavirus 2 by RT PCR NEGATIVE NEGATIVE Final    Comment: (NOTE) SARS-CoV-2 target nucleic acids are NOT DETECTED.  The SARS-CoV-2 RNA is generally detectable in upper respiratory specimens during the acute phase of infection. The lowest concentration of SARS-CoV-2 viral copies this assay can detect is 138 copies/mL. A negative result does not preclude SARS-Cov-2 infection and should not be used as the sole basis for treatment or other patient management decisions. A negative result may occur with  improper specimen collection/handling, submission of specimen other than nasopharyngeal swab, presence of viral mutation(s) within the areas targeted by this assay, and inadequate number of viral copies(<138 copies/mL). A negative result must be combined with clinical observations, patient history, and epidemiological information. The expected result is Negative.  Fact Sheet for Patients:  EntrepreneurPulse.com.au  Fact Sheet for Healthcare Providers:  IncredibleEmployment.be  This test is no t yet approved or cleared by the [place] FDA and  has been authorized for detection and/or diagnosis of SARS-CoV-2 by FDA under an Emergency Use Authorization (EUA). This EUA will remain  in effect (meaning this test can be used) for the duration of the COVID-19 declaration under Section 564(b)(1) of the Act, 21 U.S.C.section 360bbb-3(b)(1), unless the authorization is terminated  or revoked sooner.       Influenza A by PCR NEGATIVE NEGATIVE Final   Influenza B by PCR NEGATIVE NEGATIVE Final    Comment: (NOTE) The Xpert Xpress SARS-CoV-2/FLU/RSV plus assay is  intended as an aid in the diagnosis of influenza from Nasopharyngeal swab specimens and should not be used as a sole basis for treatment. Nasal washings and aspirates are unacceptable for Xpert Xpress SARS-CoV-2/FLU/RSV testing.  Fact Sheet for Patients: EntrepreneurPulse.com.au  Fact Sheet for Healthcare Providers: IncredibleEmployment.be  This test is not yet approved or cleared by the [place] FDA and has been authorized for detection and/or diagnosis of SARS-CoV-2 by FDA under an Emergency Use Authorization (EUA). This EUA will remain in effect (meaning this test can be used) for the duration of the COVID-19 declaration under Section 564(b)(1) of the Act, 21 U.S.C. section 360bbb-3(b)(1), unless the authorization is terminated or revoked.  Performed at [facility], [place] [address] [place],  [address]          Radiology Studies: DG Chest Portable 1 View  Result Date: 04/21/2021 CLINICAL DATA:  Chest tightness. EXAM: PORTABLE CHEST 1 VIEW COMPARISON:  PA and lateral chest 04/23/2021. FINDINGS: There is cardiomegaly without edema. Small bilateral pleural effusions and basilar atelectasis are worse on the left and unchanged. No pneumothorax. The patient is status post CABG. No acute or focal abnormality. IMPRESSION: No change in left greater than right small pleural effusions and basilar atelectasis. Cardiomegaly without edema. Electronically Signed   By: Inge Rise M.D.   On: 05/02/2021 17:48        Scheduled Meds:  aspirin  81 mg Oral Daily   atorvastatin  40 mg Oral Daily   calcitRIOL  0.25 mcg Oral Daily   [START ON 05/12/2021] COVID-19 mRNA vaccine (Moderna)  0.25 mL Intramuscular ONCE-1600   heparin  5,000 Units Subcutaneous BID   insulin aspart  0-9 Units Subcutaneous TID WC   isosorbide-hydrALAZINE  1 tablet Oral TID   lactulose  10 g Oral Daily   Continuous Infusions:   LOS: 0 days    Time spent:      Hosie Poisson, MD [facility]   To contact the attending provider between 7A-7P or the covering provider during after hours 7P-7A, please log into the web site [email or web address] and access using universal [place] password for that web site.  If you do not have the password, please call the hospital operator.  05/11/2021, 1:26 PM

## 2021-05-11 NOTE — Plan of Care (Signed)

## 2021-05-11 NOTE — Evaluation (Signed)
Physical Therapy Evaluation Patient Details Name: Beverly Martin MRN: [id number] DOB: 07-17-1956 Today's Date: 05/11/2021   History of Present Illness  65 y.o. female presents to [facility] ED on 04/29/2021 for SOB, chest tightness and LE edema. Pt admitted for management of CHF. PMH includes systolic heart failure, diabetes, coronary disease status post CABG, hypertension, hyperlipidemia CKD 4/5.  Clinical Impression  Pt presents to PT with deficits in activity tolerance, although not far from recent baseline. PT is able to ambulate with UE support of walker, demonstrating no loss of balance during this evaluation. Pt reports a desire to improve activity tolerance and return to mobilizing without oxygen, only having needed oxygen for the past month. Pt will benefit from continued acute PT services and HHPT at discharge to improve activity tolerance and to restore independence in a community setting.    Follow Up Recommendations Home health PT    Equipment Recommendations  None recommended by PT    Recommendations for Other Services       Precautions / Restrictions Precautions Precautions: Fall Precaution Comments: on 1L at baseline per pt report Restrictions Weight Bearing Restrictions: No      Mobility  Bed Mobility Overal bed mobility: Modified Independent                  Transfers Overall transfer level: Modified independent                  Ambulation/Gait Ambulation/Gait assistance: Supervision;Modified independent (Device/Increase time) Gait Distance (Feet): 300 Feet (supervision initially, progressing to modI) Assistive device: Rolling walker (2 wheeled) Gait Pattern/deviations: Step-through pattern Gait velocity: reduced Gait velocity interpretation: 1.31 - 2.62 ft/sec, indicative of limited community ambulator General Gait Details: pt with slowed step-through gait  Stairs            Wheelchair Mobility    Modified Rankin (Stroke Patients Only)        Balance Overall balance assessment: Mild deficits observed, not formally tested                                           Pertinent Vitals/Pain Pain Assessment: No/denies pain    Home Living Family/patient expects to be discharged to:: Private residence Living Arrangements: Spouse/significant other Available Help at Discharge: Family;Available PRN/intermittently Type of Home: House Home Access: Level entry     Home Layout: One level Home Equipment: Clinical cytogeneticist - 2 wheels;Transport chair      Prior Function Level of Independence: Needs assistance   Gait / Transfers Assistance Needed: Pt ambulates with RW at home when needed, but primarily ambulates without device in the home. Pt reports not leaving the home often and uses transport chair in community when she does.  ADL's / Homemaking Assistance Needed: Pt receives assistance for dressing and bathing from sisters        Hand Dominance        Extremity/Trunk Assessment   Upper Extremity Assessment Upper Extremity Assessment: Overall WFL for tasks assessed    Lower Extremity Assessment Lower Extremity Assessment: Generalized weakness    Cervical / Trunk Assessment Cervical / Trunk Assessment: Normal  Communication   Communication: No difficulties  Cognition Arousal/Alertness: Awake/alert Behavior During Therapy: WFL for tasks assessed/performed Overall Cognitive Status: Within Functional Limits for tasks assessed  General Comments General comments (skin integrity, edema, etc.): pt on 1L [place] throughout session with stable sats    Exercises     Assessment/Plan    PT Assessment Patient needs continued PT services  PT Problem List Cardiopulmonary status limiting activity;Decreased balance;Decreased activity tolerance       PT Treatment Interventions Gait training;DME instruction;Therapeutic activities;Therapeutic  exercise;Balance training;Patient/family education    PT Goals (Current goals can be found in the Care Plan section)  Acute Rehab PT Goals Patient Stated Goal: to walk without oxygen PT Goal Formulation: With patient Time For Goal Achievement: June 01, 2021 Potential to Achieve Goals: Fair Additional Goals Additional Goal #1: Pt will ambulate for >200' on room air maintaining sats at or above 92% Additional Goal #2: Pt will score >19/24 on DGI to inidicate a reduced risk for falls    Frequency Min 3X/week   Barriers to discharge        Co-evaluation               AM-PAC PT "6 Clicks" Mobility  Outcome Measure Help needed turning from your back to your side while in a flat bed without using bedrails?: None Help needed moving from lying on your back to sitting on the side of a flat bed without using bedrails?: None Help needed moving to and from a bed to a chair (including a wheelchair)?: None Help needed standing up from a chair using your arms (e.g., wheelchair or bedside chair)?: None Help needed to walk in hospital room?: A Little Help needed climbing 3-5 steps with a railing? : A Little 6 Click Score: 22    End of Session Equipment Utilized During Treatment: Oxygen Activity Tolerance: Patient tolerated treatment well Patient left: in chair;with call bell/phone within reach Nurse Communication: Mobility status PT Visit Diagnosis: Other abnormalities of gait and mobility (R26.89)    Time: [id number] PT Time Calculation (min) (ACUTE ONLY): 24 min   Charges:   PT Evaluation $PT Eval Low Complexity: [place], PT, DPT Acute Rehabilitation Pager: [phone]   Zenaida Niece 05/11/2021, 9:49 AM

## 2021-05-11 NOTE — Progress Notes (Signed)
[facility] Progress Note   65 y.o. female with PMHx of CAD s/p CABG in 123456, combined systolic and diastolic heart failure (EF 20-25%), type II diabetes mellitus, hypertension, CKD IV (baseline sCr 2.9-3.2) mult admissions for acute on chronic systolic HF.  She was just in the hospital and responded nicely to diuretics leaving  at 111.8 lbs. When she left the hospital on 04/30/2021 her Cr was 3.6. She's presenting with abnormal labs and was noted to have a Cr of 4.61 with hyperkalemia and a  CXR showing b/l pleural effusions. She is on torsemide (40/20 mg am/pm)  and metolazone (2.'5mg'$  on Mon and Fri) but was told by her PCP to discontinue the diuretics today and come to the ED for evaluation.    Assessment/ Plan:   AKI on CKD IV: Baseline sCr was in the 2.9-3.1 range recently and she was d/c w/ Cr of 3.6 a week ago; h/o cardiorenal syndrome in setting of CKD secondary to hypertension and DM. Renal US 7/13 without obstruction consistent with chronic renal disease. Subnephrotic proteinuria secondary to diabetes mellitus; RAAS blockade/SGLT2- use not possible with current renal function. Has had recent vein mapping; no indication for emergent RRT at this time; however, does appear to be headed towards requiring HD. I'm not convinced she would tolerate dialysis with her cardiac status. - Agree with holding the diuretics for now; hopefully she's just a little prerenal. Again, just not convinced she will tolerate HD which is where she's headed. Appears to have a new BL creatinine after the last admission in July 2022. - Strict I&O's with daily weights -> net -220. - Urine studies are not c/w prerenal state; still need to check bladder scan to r/o retention but this is low on the differential. - Certainly has some edema but she states usually it's much worse. Breathing is comfortable as well. - Will continue to monitor volume status -> wt is 110 lbs so she's below her prior d/c weight (could  have lost weight also) . We discussed whether in future she would want iHD; I'm not convinced she will tolerate but I wanted to cont the conversation with her. She's not sure at this time and fortunately we don't have to make that decision immediately. - She has an outpt appt with Dr. Justin Mend 8/16 230PM  - I would hold the diuresis for today and likely will need to restart tomorrow. I still would not give hydration, preferring to let her equilibrate.   Acute on chronic combined systolic and diastolic heart failure: suspected to be mixed ischemic and non-ischemic cardiomyopathy complicated by medication nonadherence in the past. Metabolic alkalosis: Chronic, not a good candidate for diamox due to compromised renal function. No need for urgent RRT at this time. Will continue to trend. Macrocytic anemia w/hx of normocytic anemia: Iron replete. Vitamin B12 and folate wnl. Will likely need to continue Aranesp as outpatient. Type II DM: insulin dosing per primary team   Subjective:   Breathing same as yest, no worse. Denies nausea/ fever/ chills/ CP.   Objective:   BP 139/74 (BP Location: Right Arm)   Pulse 83   Temp 97.9 F (36.6 C) (Oral)   Resp 18   Ht '5\' 5"'$  (1.651 m)   Wt 49.9 kg   SpO2 97%   BMI 18.31 kg/m   Intake/Output Summary (Last 24 hours) at 05/11/2021 1018 Last data filed at 05/11/2021 0600 Gross per 24 hour  Intake 180 ml  Output 400 ml  Net -220  ml   Weight change:   Physical Exam: General:  Elderly appearing  Neck: supple. JVP 3" above clavicle ear. No bruits. No lymphadenopathy or thryomegaly appreciated. Cor: PMI nondisplaced. Regular rate & rhythm. Lungs: no crackles but very poor air movement Abdomen: soft, nontender, nondistended. Extremities: no cyanosis, clubbing, rash, tr edema Neuro: alert & orientedx3, cranial nerves grossly intact. moves all 4 extremities w/o difficulty.   Imaging: DG Chest Portable 1 View  Result Date: 04/26/2021 CLINICAL DATA:  Chest  tightness. EXAM: PORTABLE CHEST 1 VIEW COMPARISON:  PA and lateral chest 04/23/2021. FINDINGS: There is cardiomegaly without edema. Small bilateral pleural effusions and basilar atelectasis are worse on the left and unchanged. No pneumothorax. The patient is status post CABG. No acute or focal abnormality. IMPRESSION: No change in left greater than right small pleural effusions and basilar atelectasis. Cardiomegaly without edema. Electronically Signed   By: Inge Rise M.D.   On: 05/05/2021 17:48    Labs: BMET Recent Labs  Lab 05/08/21 1307 05/09/21 0854 04/14/2021 1024 04/22/2021 1557 04/20/2021 1855 05/11/21 0147  NA 139 142 143 141  --  141  K 6.2 No hemolysis seen* 5.8* 5.7* 5.5*  --  5.6*  CL 88* 90* 90* 89*  --  88*  CO2 43* 44* 45* 44*  --  43*  GLUCOSE 367* 177* 214* 238*  --  273*  BUN 117* 113* 112* 111*  --  108*  CREATININE 4.48* 4.60* 4.65* 4.61* 4.59* 4.65*  CALCIUM 9.8 9.9 9.7 9.6  --  9.6  PHOS  --   --   --   --  5.8*  --    CBC Recent Labs  Lab 05/08/21 1307 04/22/2021 1557 04/12/2021 1855 05/11/21 0147  WBC 5.2 5.1 5.1 5.9  NEUTROABS 4.7 4.5  --  5.2  HGB 8.3* 9.1* 8.7* 9.1*  HCT 25.4* 30.5* 29.6* 30.1*  MCV 101.3* 110.1* 110.0* 108.7*  PLT 159.0 168 148* 148*    Medications:     aspirin  81 mg Oral Daily   atorvastatin  40 mg Oral Daily   calcitRIOL  0.25 mcg Oral Daily   heparin  5,000 Units Subcutaneous BID   insulin aspart  0-9 Units Subcutaneous TID WC   isosorbide-hydrALAZINE  1 tablet Oral TID   lactulose  10 g Oral Daily      Otelia Santee, MD 05/11/2021, 10:18 AM  65 y.o. female with PMHx of CAD s/p CABG in [id number], combined systolic and diastolic heart failure (EF 20-25%), type II diabetes mellitus, hypertension, CKD IV (baseline sCr 2.9-3.2), and hyperlipidemia with multiple recent admissions for acute on chronic systolic HF and following in the [facility] HF clinic presented with concerns of worsening labs. She was just in the hospital and responded  nicely to diuretics leaving  at 111.8 lbs. When she left the hospital on 04/30/2021 her Cr was 3.6. She's presenting with abnormal labs and was noted to have a Cr of 4.61 with hyperkalemia and a  CXR showing b/l pleural effusions. She was treated with 5 units of insulin. She is on torsemide (40/20 mg am/pm)  and metolazone (2.'5mg'$  on Mon and Fri) but was told by her PCP to discontinue the diuretics today and come to the ED for evaluation.  She denies any dysuria, decreased UOP or worsening dyspnea; she denies orthopnea but prefers to sleep with some incline because of habit. She has had orthopnea in the past.

## 2021-05-12 DIAGNOSIS — N179 Acute kidney failure, unspecified: Secondary | ICD-10-CM | POA: Diagnosis not present

## 2021-05-12 DIAGNOSIS — E875 Hyperkalemia: Secondary | ICD-10-CM | POA: Diagnosis not present

## 2021-05-12 LAB — BASIC METABOLIC PANEL
Anion gap: 10 (ref 5–15)
Anion gap: 9 (ref 5–15)
BUN: 97 mg/dL — ABNORMAL HIGH (ref 8–23)
BUN: 93 mg/dL — ABNORMAL HIGH (ref 8–23)
CO2: 44 mmol/L — ABNORMAL HIGH (ref 22–32)
CO2: 43 mmol/L — ABNORMAL HIGH (ref 22–32)
Calcium: 9.2 mg/dL (ref 8.9–10.3)
Calcium: 9.4 mg/dL (ref 8.9–10.3)
Chloride: 88 mmol/L — ABNORMAL LOW (ref 98–111)
Chloride: 90 mmol/L — ABNORMAL LOW (ref 98–111)
Creatinine, Ser: 4.13 mg/dL — ABNORMAL HIGH (ref 0.44–1.00)
Creatinine, Ser: 3.87 mg/dL — ABNORMAL HIGH (ref 0.44–1.00)
GFR, Estimated: 11 mL/min — ABNORMAL LOW (ref >60)
GFR, Estimated: 12 mL/min — ABNORMAL LOW (ref >60)
Glucose, Bld: 248 mg/dL — ABNORMAL HIGH (ref 70–99)
Glucose, Bld: 101 mg/dL — ABNORMAL HIGH (ref 70–99)
Potassium: 4.9 mmol/L (ref 3.5–5.1)
Potassium: 4.1 mmol/L (ref 3.5–5.1)
Sodium: 142 mmol/L (ref 135–145)
Sodium: 142 mmol/L (ref 135–145)

## 2021-05-12 LAB — GLUCOSE, CAPILLARY
Glucose-Capillary: 258 mg/dL — ABNORMAL HIGH (ref 70–99)
Glucose-Capillary: 107 mg/dL — ABNORMAL HIGH (ref 70–99)
Glucose-Capillary: 85 mg/dL (ref 70–99)
Glucose-Capillary: 193 mg/dL — ABNORMAL HIGH (ref 70–99)

## 2021-05-12 MED ORDER — TORSEMIDE 20 MG PO TABS
20.0000 mg | ORAL_TABLET | Freq: Two times a day (BID) | ORAL | Status: DC
  Administered 2021-05-12 – 2021-05-14 (×4): 20 mg via ORAL
  Filled 2021-05-12 (×4): qty 1

## 2021-05-12 NOTE — Progress Notes (Signed)
PROGRESS NOTE    CHIYO MARRON  [id number] DOB: October 03, 1956 DOA: 05/09/2021 PCP: Billie Ruddy, MD    Chief Complaint  Patient presents with   Abnormal Labs    R/T kidney function    Brief Narrative:    Beverly Martin is a 65 y.o. female with medical history significant for systolic heart failure, diabetes, coronary disease status post CABG, hypertension, hyperlipidemia CKD 4/5 presents to the ED from PCP office shortly after returning home for hyperkalemia and elevated creatinine Assessment & Plan:   Principal Problem:   Hyperkalemia Active Problems:   Essential hypertension   Type 2 diabetes mellitus with hyperlipidemia (HCC)   CAD (coronary artery disease)   Elevated troponin   CKD (chronic kidney disease) stage 4, GFR 15-29 ml/min (HCC)   Chronic systolic heart failure (HCC)   Macrocytic anemia   Pressure injury of skin   AKI (acute kidney injury) ([place])    Acute on Stage 4 CKD:  - suspect this is her new baseline.  - nephrology on board and appreciate recommendations.  - monitor urine output and repeat renal parameters in am.  - pt started on torsemide 20 mg BID.    Chronic systolic and diastolic heart failure  Echocardiogram on 04/25/21 showed Left ventricular ejection fraction, by estimation, is 30 to 35%, with  moderately decreased function,  global hypokinesis and  mild left ventricular  hypertrophy. Left ventricular diastolic parameters are consistent with Grade II diastolic dysfunction. Right ventricular systolic functin is mod reduced with elevated pulm artery systolic pressure.  Continue with bidil. She is started on torsemide 20 mg BID.  Continue with strict intake and output.  Daily weights.     Hypertension;  WELL CONTROLLED.    Type 2 dm with stage 4 KD - CBG (last 3)  Recent Labs    05/11/21 1143 05/11/21 1538 05/11/21 2139  GLUCAP 142* 86 190*    Resume SSI. Hemoglobin A1c is 9.1 in June 2022.  NO changes in meds.  Hyperkalemia:   Resolved with lokelma.    Metabolic alkalosis:  Monitor.     Anemia of chronic disease:  Hemoglobin between 8 to 9.  Transfuse to keep hemoglobin greater than 7.   Hyperlipidemia:  Continue with lipitor.    CAD:  Denies any chest pain or sob.   Resume aspirin and lipitor.    Stage 1 pressure injury on right buttock.  Pressure Injury 04/24/2021 Buttocks Right;Left Stage 1 -  Intact skin with non-blanchable redness of a localized area usually over a bony prominence. blanchable redness dressing applied to protect area (Active)  04/12/2021 2205  Location: Buttocks  Location Orientation: Right;Left  Staging: Stage 1 -  Intact skin with non-blanchable redness of a localized area usually over a bony prominence.  Wound Description (Comments): blanchable redness dressing applied to protect area  Present on Admission: Yes   Continue with foam dressing.    Mild thrombocytopenia No bleeding seen  DVT prophylaxis: Heparin.  Code Status: full code.  Family Communication: none at bedside.  Disposition:   Status is: Inpatient  Remains inpatient appropriate because:Ongoing diagnostic testing needed not appropriate for outpatient work up, Unsafe d/c plan, and IV treatments appropriate due to intensity of illness or inability to take PO  Dispo: The patient is from: Home              Anticipated d/c is to: Home              Patient currently is  not medically stable to d/c.   Difficult to place patient No       Consultants:  Nephrology.   Procedures: none.   Antimicrobials: none.    Subjective: No chest pan. No nausea, vomiting or abd pain.   Objective: Vitals:   05/11/21 0740 05/11/21 1144 05/11/21 1944 05/12/21 0413  BP: 139/74 130/89 (!) 142/75 126/71  Pulse: 83 89 93 92  Resp: '18 20 16 16  '$ Temp: 97.9 F (36.6 C) 98 F (36.7 C) 98.1 F (36.7 C) 97.9 F (36.6 C)  TempSrc: Oral Oral Oral Oral  SpO2: 97% 100% 98% 96%  Weight:    50.6 kg  Height:         Intake/Output Summary (Last 24 hours) at 05/12/2021 1001 Last data filed at 05/12/2021 0830 Gross per 24 hour  Intake --  Output 825 ml  Net -825 ml    Filed Weights   05/07/2021 2204 05/11/21 0512 05/12/21 0413  Weight: 49.9 kg 49.9 kg 50.6 kg    Examination:  General exam: Alert and comfortable  Respiratory system: Air entry fair bilateral, no wheezing or rhonchi Cardiovascular system: Regular rate rhythm, no JVD no pedal edema Gastrointestinal system: Abdomen is soft nontender bowel sounds normal Central nervous system: Alert and oriented, grossly nonfocal Extremities: No cyanosis noted Skin: Stage I pressure injury on the right buttock present Psychiatry: Mood is appropriate    Data Reviewed: I have personally reviewed following labs and imaging studies  CBC: Recent Labs  Lab 05/08/21 1307 04/29/2021 1557 05/03/2021 1855 05/11/21 0147  WBC 5.2 5.1 5.1 5.9  NEUTROABS 4.7 4.5  --  5.2  HGB 8.3* 9.1* 8.7* 9.1*  HCT 25.4* 30.5* 29.6* 30.1*  MCV 101.3* 110.1* 110.0* 108.7*  PLT 159.0 168 148* 148*     Basic Metabolic Panel: Recent Labs  Lab 05/08/21 1307 05/09/21 0854 04/16/2021 1024 05/09/2021 1557 04/18/2021 1855 05/11/21 0147 05/11/21 1805 05/12/21 0527  NA 139   < > 143 141  --  141 140 142  K 6.2 No hemolysis seen*   < > 5.7* 5.5*  --  5.6* 5.7* 4.9  CL 88*   < > 90* 89*  --  88* 90* 88*  CO2 43*   < > 45* 44*  --  43* 39* 44*  GLUCOSE 367*   < > 214* 238*  --  273* 139* 248*  BUN 117*   < > 112* 111*  --  108* 104* 97*  CREATININE 4.48*   < > 4.65* 4.61* 4.59* 4.65* 4.21* 4.13*  CALCIUM 9.8   < > 9.7 9.6  --  9.6 9.4 9.2  MG 2.9*  --   --   --   --  2.8*  --   --   PHOS  --   --   --   --  5.8*  --   --   --    < > = values in this interval not displayed.     GFR: Estimated Creatinine Clearance: 10.7 mL/min (A) (by C-G formula based on SCr of 4.13 mg/dL (H)).  Liver Function Tests: Recent Labs  Lab 05/08/21 1307 05/09/21 0854 05/11/21 0147   AST 11 11 13*  ALT '17 16 17  '$ ALKPHOS 82 80 68  BILITOT 0.6 0.6 1.1  PROT 7.2 6.8 6.5  ALBUMIN 4.3 4.2 3.7     CBG: Recent Labs  Lab 04/15/2021 2220 05/11/21 0739 05/11/21 1143 05/11/21 1538 05/11/21 2139  GLUCAP 241* 252* 142* 86  190*      Recent Results (from the past 240 hour(s))  Resp Panel by RT-PCR (Flu A&B, Covid) Nasopharyngeal Swab     Status: None   Collection Time: 04/23/2021  6:37 PM   Specimen: Nasopharyngeal Swab; Nasopharyngeal(NP) swabs in vial transport medium  Result Value Ref Range Status   SARS Coronavirus 2 by RT PCR NEGATIVE NEGATIVE Final    Comment: (NOTE) SARS-CoV-2 target nucleic acids are NOT DETECTED.  The SARS-CoV-2 RNA is generally detectable in upper respiratory specimens during the acute phase of infection. The lowest concentration of SARS-CoV-2 viral copies this assay can detect is 138 copies/mL. A negative result does not preclude SARS-Cov-2 infection and should not be used as the sole basis for treatment or other patient management decisions. A negative result may occur with  improper specimen collection/handling, submission of specimen other than nasopharyngeal swab, presence of viral mutation(s) within the areas targeted by this assay, and inadequate number of viral copies(<138 copies/mL). A negative result must be combined with clinical observations, patient history, and epidemiological information. The expected result is Negative.  Fact Sheet for Patients:  EntrepreneurPulse.com.au  Fact Sheet for Healthcare Providers:  IncredibleEmployment.be  This test is no t yet approved or cleared by the [place] FDA and  has been authorized for detection and/or diagnosis of SARS-CoV-2 by FDA under an Emergency Use Authorization (EUA). This EUA will remain  in effect (meaning this test can be used) for the duration of the COVID-19 declaration under Section 564(b)(1) of the Act, 21 U.S.C.section  360bbb-3(b)(1), unless the authorization is terminated  or revoked sooner.       Influenza A by PCR NEGATIVE NEGATIVE Final   Influenza B by PCR NEGATIVE NEGATIVE Final    Comment: (NOTE) The Xpert Xpress SARS-CoV-2/FLU/RSV plus assay is intended as an aid in the diagnosis of influenza from Nasopharyngeal swab specimens and should not be used as a sole basis for treatment. Nasal washings and aspirates are unacceptable for Xpert Xpress SARS-CoV-2/FLU/RSV testing.  Fact Sheet for Patients: EntrepreneurPulse.com.au  Fact Sheet for Healthcare Providers: IncredibleEmployment.be  This test is not yet approved or cleared by the [place] FDA and has been authorized for detection and/or diagnosis of SARS-CoV-2 by FDA under an Emergency Use Authorization (EUA). This EUA will remain in effect (meaning this test can be used) for the duration of the COVID-19 declaration under Section 564(b)(1) of the Act, 21 U.S.C. section 360bbb-3(b)(1), unless the authorization is terminated or revoked.  Performed at [facility], [place] [address] [place], [place] [address]           Radiology Studies: DG Chest Portable 1 View  Result Date: 04/14/2021 CLINICAL DATA:  Chest tightness. EXAM: PORTABLE CHEST 1 VIEW COMPARISON:  PA and lateral chest 04/23/2021. FINDINGS: There is cardiomegaly without edema. Small bilateral pleural effusions and basilar atelectasis are worse on the left and unchanged. No pneumothorax. The patient is status post CABG. No acute or focal abnormality. IMPRESSION: No change in left greater than right small pleural effusions and basilar atelectasis. Cardiomegaly without edema. Electronically Signed   By: Inge Rise M.D.   On: 04/29/2021 17:48        Scheduled Meds:  aspirin  81 mg Oral Daily   atorvastatin  40 mg Oral Daily   calcitRIOL  0.25 mcg Oral Daily   COVID-19 mRNA vaccine (Moderna)  0.25 mL Intramuscular  ONCE-1600   heparin  5,000 Units Subcutaneous BID   insulin aspart  0-9 Units Subcutaneous  TID WC   isosorbide-hydrALAZINE  1 tablet Oral TID   lactulose  10 g Oral Daily   torsemide  20 mg Oral BID   Continuous Infusions:   LOS: 1 day    Time spent: 32 minutes    Hosie Poisson, MD [facility]   To contact the attending provider between 7A-7P or the covering provider during after hours 7P-7A, please log into the web site [email or web address] and access using universal [place] password for that web site. If you do not have the password, please call the hospital operator.  05/12/2021, 10:01 AM

## 2021-05-12 NOTE — Progress Notes (Addendum)
[facility] Progress Note   65 y.o. female with PMHx of CAD s/p CABG in 123456, combined systolic and diastolic heart failure (EF 20-25%), type II diabetes mellitus, hypertension, CKD IV (baseline sCr 2.9-3.2) mult admissions for acute on chronic systolic HF.  She was just in the hospital and responded nicely to diuretics leaving  at 111.8 lbs. When she left the hospital on 04/30/2021 her Cr was 3.6. She's presenting with abnormal labs and was noted to have a Cr of 4.61 with hyperkalemia and a  CXR showing b/l pleural effusions. She is on torsemide (40/20 mg am/pm)  and metolazone (2.'5mg'$  on Mon and Fri) but was told by her PCP to discontinue the diuretics today and come to the ED for evaluation.    Assessment/ Plan:   AKI on CKD IV: Baseline sCr was in the 2.9-3.1 range recently and she was d/c w/ Cr of 3.6 a week ago; h/o cardiorenal syndrome in setting of CKD secondary to hypertension and DM. Renal US 7/13 without obstruction consistent with chronic renal disease. Subnephrotic proteinuria secondary to diabetes mellitus; RAAS blockade/SGLT2- use not possible with current renal function. Has had recent vein mapping; no indication for emergent RRT at this time; however, does appear to be headed towards requiring HD. I'm not convinced she would tolerate dialysis with her cardiac status. - Agree with holding the diuretics for now; hopefully she's just a little prerenal. Again, just not convinced she will tolerate HD which is where she's headed. Appears to have a new BL creatinine after the last admission in July 2022.  - Strict I&O's with daily weights -> input not well recorded but by weights she is at 111.5 lbs. She was d/c at 111.5 lbs last week after diuresing. Would restart her Bumex; can hold the metolazone for tomorrow.  - She was d/c with Bumex 40/20 AM/PM + Metolazone 2.5 Mon and Fri. I would restart Bumex at 20/20 and monitor response; escalating dose based on response. Almost  certainly will need to escalate.  . We discussed whether in future she would want iHD; I'm not convinced she will tolerate but I wanted to cont the conversation with her. She's not sure at this time and fortunately we don't have to make that decision immediately. - She has an outpt appt with Dr. Justin Mend 8/16 230PM    Acute on chronic combined systolic and diastolic heart failure: suspected to be mixed ischemic and non-ischemic cardiomyopathy complicated by medication nonadherence in the past. Metabolic alkalosis: Chronic, not a good candidate for diamox due to compromised renal function. No need for urgent RRT at this time. Will continue to trend. Macrocytic anemia w/hx of normocytic anemia: Iron replete. Vitamin B12 and folate wnl. Will likely need to continue Aranesp as outpatient. Type II DM: insulin dosing per primary team   Subjective:   Breathing same as yest, no worse. Denies nausea/ fever/ chills/ CP.   Objective:   BP 126/71 (BP Location: Right Arm)   Pulse 92   Temp 97.9 F (36.6 C) (Oral)   Resp 16   Ht '5\' 5"'$  (1.651 m)   Wt 50.6 kg   SpO2 96%   BMI 18.56 kg/m   Intake/Output Summary (Last 24 hours) at 05/12/2021 [id number] Last data filed at 05/12/2021 0830 Gross per 24 hour  Intake --  Output 825 ml  Net -825 ml   Weight change: 0.659 kg  Physical Exam: General:  Elderly appearing  Neck: supple. JVP 3" above clavicle ear. No bruits. No  lymphadenopathy or thryomegaly appreciated. Cor: PMI nondisplaced. Regular rate & rhythm. Lungs: no crackles but very poor air movement Abdomen: soft, nontender, nondistended. Extremities: no cyanosis, clubbing, rash, tr edema Neuro: alert & orientedx3, cranial nerves grossly intact. moves all 4 extremities w/o difficulty.   Imaging: DG Chest Portable 1 View  Result Date: 04/26/2021 CLINICAL DATA:  Chest tightness. EXAM: PORTABLE CHEST 1 VIEW COMPARISON:  PA and lateral chest 04/23/2021. FINDINGS: There is cardiomegaly without edema.  Small bilateral pleural effusions and basilar atelectasis are worse on the left and unchanged. No pneumothorax. The patient is status post CABG. No acute or focal abnormality. IMPRESSION: No change in left greater than right small pleural effusions and basilar atelectasis. Cardiomegaly without edema. Electronically Signed   By: Inge Rise M.D.   On: 05/09/2021 17:48    Labs: BMET Recent Labs  Lab 05/08/21 1307 05/09/21 0854 05/08/2021 1024 04/17/2021 1557 04/25/2021 1855 05/11/21 0147 05/11/21 1805 05/12/21 0527  NA 139 142 143 141  --  141 140 142  K 6.2 No hemolysis seen* 5.8* 5.7* 5.5*  --  5.6* 5.7* 4.9  CL 88* 90* 90* 89*  --  88* 90* 88*  CO2 43* 44* 45* 44*  --  43* 39* 44*  GLUCOSE 367* 177* 214* 238*  --  273* 139* 248*  BUN 117* 113* 112* 111*  --  108* 104* 97*  CREATININE 4.48* 4.60* 4.65* 4.61* 4.59* 4.65* 4.21* 4.13*  CALCIUM 9.8 9.9 9.7 9.6  --  9.6 9.4 9.2  PHOS  --   --   --   --  5.8*  --   --   --    CBC Recent Labs  Lab 05/08/21 1307 04/18/2021 1557 05/06/2021 1855 05/11/21 0147  WBC 5.2 5.1 5.1 5.9  NEUTROABS 4.7 4.5  --  5.2  HGB 8.3* 9.1* 8.7* 9.1*  HCT 25.4* 30.5* 29.6* 30.1*  MCV 101.3* 110.1* 110.0* 108.7*  PLT 159.0 168 148* 148*    Medications:     aspirin  81 mg Oral Daily   atorvastatin  40 mg Oral Daily   calcitRIOL  0.25 mcg Oral Daily   COVID-19 mRNA vaccine (Moderna)  0.25 mL Intramuscular ONCE-1600   heparin  5,000 Units Subcutaneous BID   insulin aspart  0-9 Units Subcutaneous TID WC   isosorbide-hydrALAZINE  1 tablet Oral TID   lactulose  10 g Oral Daily      Otelia Santee, MD 05/12/2021, 9:03 AM  65 y.o. female with PMHx of CAD s/p CABG in 123456, combined systolic and diastolic heart failure (EF 20-25%), type II diabetes mellitus, hypertension, CKD IV (baseline sCr 2.9-3.2), and hyperlipidemia with multiple recent admissions for acute on chronic systolic HF and following in the [facility] HF clinic presented with concerns of worsening labs.  She was just in the hospital and responded nicely to diuretics leaving  at 111.8 lbs. When she left the hospital on 04/30/2021 her Cr was 3.6. She's presenting with abnormal labs and was noted to have a Cr of 4.61 with hyperkalemia and a  CXR showing b/l pleural effusions. She was treated with 5 units of insulin. She is on torsemide (40/20 mg am/pm)  and metolazone (2.'5mg'$  on Mon and Fri) but was told by her PCP to discontinue the diuretics today and come to the ED for evaluation.  She denies any dysuria, decreased UOP or worsening dyspnea; she denies orthopnea but prefers to sleep with some incline because of habit. She has had orthopnea in the  past.

## 2021-05-13 ENCOUNTER — Other Ambulatory Visit: Payer: BC Managed Care – PPO

## 2021-05-13 DIAGNOSIS — I5043 Acute on chronic combined systolic (congestive) and diastolic (congestive) heart failure: Secondary | ICD-10-CM

## 2021-05-13 DIAGNOSIS — N179 Acute kidney failure, unspecified: Secondary | ICD-10-CM | POA: Diagnosis not present

## 2021-05-13 DIAGNOSIS — E875 Hyperkalemia: Secondary | ICD-10-CM | POA: Diagnosis not present

## 2021-05-13 LAB — GLUCOSE, CAPILLARY
Glucose-Capillary: 183 mg/dL — ABNORMAL HIGH (ref 70–99)
Glucose-Capillary: 190 mg/dL — ABNORMAL HIGH (ref 70–99)
Glucose-Capillary: 134 mg/dL — ABNORMAL HIGH (ref 70–99)
Glucose-Capillary: 139 mg/dL — ABNORMAL HIGH (ref 70–99)

## 2021-05-13 LAB — BASIC METABOLIC PANEL
Anion gap: 9 (ref 5–15)
BUN: 89 mg/dL — ABNORMAL HIGH (ref 8–23)
CO2: 44 mmol/L — ABNORMAL HIGH (ref 22–32)
Calcium: 9.2 mg/dL (ref 8.9–10.3)
Chloride: 90 mmol/L — ABNORMAL LOW (ref 98–111)
Creatinine, Ser: 3.94 mg/dL — ABNORMAL HIGH (ref 0.44–1.00)
GFR, Estimated: 12 mL/min — ABNORMAL LOW (ref >60)
Glucose, Bld: 165 mg/dL — ABNORMAL HIGH (ref 70–99)
Potassium: 4.3 mmol/L (ref 3.5–5.1)
Sodium: 143 mmol/L (ref 135–145)

## 2021-05-13 NOTE — Consult Note (Addendum)
Advanced Heart Failure Team Consult Note   Primary Physician:Banks, Larene Beach Primary Cardiologist:  Nahser, Rito Ehrlich, Siniyah Evangelist AHF  Reason for Consultation: Chronic Combined Systolic and Diastolic CHF  HPI:    Beverly Martin is seen today for evaluation of Chronic Combined Systolic and Diastolic CHF at the request of Dr. Karleen Hampshire.   Beverly Martin is a 65 y.o.  female  with a PMH significant for HFrEF, CAD s/p CABG, HTN, T2DM, HLD, CKD IV.   Diagnosed with diabetes 2005, and had 3 vessel cardiac bypass surgery in 2006 at [facility] possibly with Dr. Nils Pyle.  Said she didn't have good follow up afterwards.  She did continue her aspirin.     Urosepsis 2019 with ATN cr peaked at 2.0 but improved before d/c to 1.1   Admitted 01/03/21 for acute on chronic systolic CHF diuresed poorly on IV lasix with worsening cr ultimately discharged heavier than when she arrived.  Her Echo 01/03/21:  EF 20-25% with severe RV dysfunction (previous EF 40-45%). Felt to be mixed ischemic/NNICM but unable to perform coronary angio due to CKD.   Seen in HF [facility] and failed outpatient diuresis so was admitted on 01/17/21 with marked volume overload/ADHF. RHC showed marked volume overload with biventricular failure and normal CO.  Milrinone added to augment diuresis. Once euvolemic milrinone stopped. Diuresed 25 pounds. Creatinine improved 3.5 -> 2.9. Discharged on bidil + torsemide 40 mg daily. Discharge weight 115 pounds.   Never followed up with nephrology on discharge   Admitted 03/14/21 w/ complaints of weakness, near syncope and dyspnea, found to be in acute CHF, BNP>4500. Her blood sugar was >400. She received IV lasix 40 mg. She declined home health and was referred to Palliative Care outpatient.   Difficulty managing her volume outpatient due to cardiomyopathy/CKD and not sticking to fluid restriction and poor understanding of her medical condition she was admitted with volume overload  04/23/21. Cr worsening. Nephrology consulted, she did receive vein mapping.  Cr stabilized at about 3.6 before d/c, REDS 41%.  discharged on torsemide 40 mg q am/ 20 mg q pm and + metolazone 2.5 on Mondays and Fridays wt 115lbs.    04/27/2021 seen in AHF app clinic REDS 38%, wt down to 112lbs. Labs returned with hyperkalemia and AKI with Cr up to 4.6 and K 5.8. Sent to ED for admission. Stable b/l small pleural effusion on chest x-ray, BNP >4500.  Diuretics were held initially with improvement in Cr.  Torsemide '20mg'$  BID restarted 05/12/21.  Wt continues to trend down.  She endorses some exertional dyspnea, denies any chest pain, PND or orthopnea.  She has poor appetite not eating much at all.     Review of Systems: [y] = yes, '[ ]'$  = no   General: Weight gain '[ ]'$ ; Weight loss [ y]; Anorexia [ y]; Fatigue Blue.Reese ]; Fever '[ ]'$ ; Chills '[ ]'$ ; Weakness '[ ]'$   Cardiac: Chest pain/pressure '[ ]'$ ; Resting SOB '[ ]'$ ; Exertional SOB Blue.Reese ]; Orthopnea '[ ]'$ ; Pedal Edema '[ ]'$ ; Palpitations '[ ]'$ ; Syncope '[ ]'$ ; Presyncope '[ ]'$ ; Paroxysmal nocturnal dyspnea'[ ]'$   Pulmonary: Cough '[ ]'$ ; Wheezing'[ ]'$ ; Hemoptysis'[ ]'$ ; Sputum '[ ]'$ ; Snoring '[ ]'$   GI: Vomiting'[ ]'$ ; Dysphagia'[ ]'$ ; Melena'[ ]'$ ; Hematochezia '[ ]'$ ; Heartburn'[ ]'$ ; Abdominal pain '[ ]'$ ; Constipation '[ ]'$ ; Diarrhea '[ ]'$ ; BRBPR '[ ]'$   GU: Hematuria'[ ]'$ ; Dysuria '[ ]'$ ; Nocturia'[ ]'$   Vascular: Pain in legs with walking '[ ]'$ ; Pain in feet  with lying flat '[ ]'$ ; Non-healing sores '[ ]'$ ; Stroke '[ ]'$ ; TIA '[ ]'$ ; Slurred speech '[ ]'$ ;  Neuro: Headaches'[ ]'$ ; Vertigo'[ ]'$ ; Seizures'[ ]'$ ; Paresthesias'[ ]'$ ;Blurred vision '[ ]'$ ; Diplopia '[ ]'$ ; Vision changes '[ ]'$   Ortho/Skin: Arthritis '[ ]'$ ; Joint pain '[ ]'$ ; Muscle pain '[ ]'$ ; Joint swelling '[ ]'$ ; Back Pain '[ ]'$ ; Rash '[ ]'$   Psych: Depression'[ ]'$ ; Anxiety'[ ]'$   Heme: Bleeding problems '[ ]'$ ; Clotting disorders '[ ]'$ ; Anemia '[ ]'$   Endocrine: Diabetes Blue.Reese ]; Thyroid dysfunction'[ ]'$   Home Medications Prior to Admission medications   Medication Sig Start Date End Date Taking? Authorizing Provider  aspirin EC  81 MG EC tablet Take 1 tablet (81 mg total) by mouth daily. Swallow whole. 06/19/20 06/14/21 Yes Hall, Carole N, DO  atorvastatin (LIPITOR) 40 MG tablet Take 1 tablet (40 mg total) by mouth daily. 04/09/21  Yes Billie Ruddy, MD  calcitRIOL (ROCALTROL) 0.25 MCG capsule TAKE 1 CAPSULE BY MOUTH EVERY DAY Patient taking differently: Take 0.25 mcg by mouth daily. 04/03/21  Yes Billie Ruddy, MD  isosorbide-hydrALAZINE (BIDIL) 20-37.5 MG tablet Take 1 tablet by mouth 3 (three) times daily. 04/30/21 05/30/21 Yes Arrien, Jimmy Picket, MD  lactulose (CHRONULAC) 10 GM/15ML solution Take 15 mLs (10 g total) by mouth daily for 5 days. 05/09/21 05/14/21 Yes Billie Ruddy, MD  metolazone (ZAROXOLYN) 2.5 MG tablet Take 1 tablet (2.5 mg total) by mouth 2 (two) times a week. Take one tablet on Monday and one tablet on Friday 05/02/21  Yes Arrien, Jimmy Picket, MD  potassium chloride SA (KLOR-CON) 20 MEQ tablet Take 2 tablets (40 mEq total) by mouth 2 (two) times a week. Take 2 tablets on Monday and 2 tablets on Friday (with motolazone) 05/02/21  Yes Arrien, Jimmy Picket, MD  torsemide (DEMADEX) 20 MG tablet Take 1 tablet (20 mg total) by mouth every evening. Patient taking differently: Take 40 mg by mouth daily. 04/30/21  Yes Arrien, Jimmy Picket, MD  Accu-Chek Softclix Lancets lancets Use as instructed Patient not taking: Reported on 04/28/2021 05/08/21   Billie Ruddy, MD  torsemide 40 MG TABS Take 40 mg by mouth every morning. Patient not taking: Reported on 04/28/2021 05/01/21   Arrien, Jimmy Picket, MD    Past Medical History: Past Medical History:  Diagnosis Date   CAD (coronary artery disease)    Chronic kidney disease (CKD) stage G4/A1, severely decreased glomerular filtration rate (GFR) between 15-29 mL/min/1.73 square meter and albuminuria creatinine ratio less than 30 mg/g (HCC)    Combined systolic and diastolic ACC/AHA stage C congestive heart failure ([place])    Diabetes mellitus without  complication ([place])    Essential hypertension    Hyperlipidemia     Past Surgical History: Past Surgical History:  Procedure Laterality Date   CARDIAC SURGERY     CABG   CORONARY ARTERY BYPASS GRAFT     RIGHT HEART CATH N/A 01/18/2021   Procedure: RIGHT HEART CATH;  Surgeon: Jolaine Artist, MD;  Location: [place] [facility];  Service: Cardiovascular;  Laterality: N/A;    Family History: Family History  Problem Relation Age of Onset   Diabetes Mellitus II Mother    Esophageal cancer Father     Social History: Social History   Socioeconomic History   Marital status: Married    Spouse name: Not on file   Number of children: Not on file   Years of education: Not on file   Highest education level: Not on  file  Occupational History   Not on file  Tobacco Use   Smoking status: Never   Smokeless tobacco: Never  Vaping Use   Vaping Use: Never used  Substance and Sexual Activity   Alcohol use: No   Drug use: No   Sexual activity: Not Currently  Other Topics Concern   Not on file  Social History Narrative   Not on file   Social Determinants of Health   Financial Resource Strain: Low Risk    Difficulty of Paying Living Expenses: Not hard at all  Food Insecurity: No Food Insecurity   Worried About Charity fundraiser in the Last Year: Never true   [place] in the Last Year: Never true  Transportation Needs: No Transportation Needs   Lack of Transportation (Medical): No   Lack of Transportation (Non-Medical): No  Physical Activity: Insufficiently Active   Days of Exercise per Week: 2 days   Minutes of Exercise per Session: 20 min  Stress: Not on file  Social Connections: Not on file    Allergies:  No Known Allergies  Objective:    Vital Signs:   Temp:  [97.8 F (36.6 C)-97.9 F (36.6 C)] 97.8 F (36.6 C) (08/01 0459) Pulse Rate:  [81-87] 86 (08/01 0459) Resp:  [15-24] 20 (08/01 0459) BP: (128-138)/(73-81) 128/73 (08/01 0459) SpO2:  [100 %] 100 %  (08/01 0459) Weight:  [49.4 kg] 49.4 kg (08/01 0459) Last BM Date: 04/30/21  Weight change: Filed Weights   05/11/21 0512 05/12/21 0413 05/13/21 0459  Weight: 49.9 kg 50.6 kg 49.4 kg    Intake/Output:   Intake/Output Summary (Last 24 hours) at 05/13/2021 1113 Last data filed at 05/13/2021 0928 Gross per 24 hour  Intake 480 ml  Output 825 ml  Net -345 ml      Physical Exam    General:  cachectic, No resp difficulty HEENT: normal Neck: supple. JVP 7 . Carotids 2+ bilat; no bruits. No lymphadenopathy or thyromegaly appreciated. Cor: PMI nondisplaced. Regular rate & rhythm. No rubs, gallops or murmurs. Lungs: rales to mid lung fields bilaterally Abdomen: soft, nontender, nondistended. No hepatosplenomegaly. No bruits or masses. Good bowel sounds. Extremities: no cyanosis, clubbing, rash, edema Neuro: alert & orientedx3, cranial nerves grossly intact. moves all 4 extremities w/o difficulty. Affect pleasant   Telemetry   Could not view history due to malfunction will review later but appears to be NSR rate 80's  EKG    NSR rate 91, LVH  Labs   Basic Metabolic Panel: Recent Labs  Lab 05/08/21 1307 05/09/21 0854 05/04/2021 1855 05/11/21 0147 05/11/21 1805 05/12/21 0527 05/12/21 1846 05/13/21 0614  NA 139   < >  --  141 140 142 142 143  K 6.2 No hemolysis seen*   < >  --  5.6* 5.7* 4.9 4.1 4.3  CL 88*   < >  --  88* 90* 88* 90* 90*  CO2 43*   < >  --  43* 39* 44* 43* 44*  GLUCOSE 367*   < >  --  273* 139* 248* 101* 165*  BUN 117*   < >  --  108* 104* 97* 93* 89*  CREATININE 4.48*   < > 4.59* 4.65* 4.21* 4.13* 3.87* 3.94*  CALCIUM 9.8   < >  --  9.6 9.4 9.2 9.4 9.2  MG 2.9*  --   --  2.8*  --   --   --   --   PHOS  --   --  5.8*  --   --   --   --   --    < > = values in this interval not displayed.    Liver Function Tests: Recent Labs  Lab 05/08/21 1307 05/09/21 0854 05/11/21 0147  AST 11 11 13*  ALT '17 16 17  '$ ALKPHOS 82 80 68  BILITOT 0.6 0.6 1.1  PROT  7.2 6.8 6.5  ALBUMIN 4.3 4.2 3.7   No results for input(s): LIPASE, AMYLASE in the last 168 hours. No results for input(s): AMMONIA in the last 168 hours.  CBC: Recent Labs  Lab 05/08/21 1307 04/26/2021 1557 05/02/2021 1855 05/11/21 0147  WBC 5.2 5.1 5.1 5.9  NEUTROABS 4.7 4.5  --  5.2  HGB 8.3* 9.1* 8.7* 9.1*  HCT 25.4* 30.5* 29.6* 30.1*  MCV 101.3* 110.1* 110.0* 108.7*  PLT 159.0 168 148* 148*    Cardiac Enzymes: No results for input(s): CKTOTAL, CKMB, CKMBINDEX, TROPONINI in the last 168 hours.  BNP: BNP (last 3 results) Recent Labs    04/24/21 1223 04/21/2021 1714 05/09/2021 1850  BNP >4,500.0* >4,500.0* >4,500.0*    ProBNP (last 3 results) Recent Labs    12/06/20 0927  PROBNP >5000.0*     CBG: Recent Labs  Lab 05/12/21 0731 05/12/21 1233 05/12/21 1639 05/12/21 2300 05/13/21 0759  GLUCAP 258* 107* 85 193* 183*    Coagulation Studies: No results for input(s): LABPROT, INR in the last 72 hours.   Imaging   No results found.   Medications:     Current Medications:  aspirin  81 mg Oral Daily   atorvastatin  40 mg Oral Daily   calcitRIOL  0.25 mcg Oral Daily   heparin  5,000 Units Subcutaneous BID   insulin aspart  0-9 Units Subcutaneous TID WC   isosorbide-hydrALAZINE  1 tablet Oral TID   lactulose  10 g Oral Daily   torsemide  20 mg Oral BID    Infusions:    Assessment/Plan   1. Chronic Combined Systolic and Diastolic CHF: - Likely mixed ischemic and NICM, poorly controlled HTN, poor medication adherence may also have progressive CAD but unable to do coronary angio due to CKD 4. - Echo (12/2020): EF down from 45-50% in 2019 to 20-25%, RV moderately reduced - Echo (04/25/21): EF 30-35%, G2DD, moderately reduced RV function. - GDMT limited by renal function. - NYHA III symptoms, no LE edema, bilateral rales but no real impressive JVD - Nephrology managing diuretic therapy on 20 torsemide BID - Continue bidil 1 tab tid. - Not candidate for  advanced HF therapies. Has refused Palliative services in past.  - Recommend continued ongoing goals of care discussion/palliative discussions.     2. AKI on CKD Stage 4: - SCr baseline 3.6. Up to 4.6 on admit day but improved with holding diuretics and now 3.94  - Will likely need HD in near future if she can tolerate it. Vein mapping completed during last admit. - Nephrology following, also not convinced she will tolerate HD.  3. CAD s/p CABG 2005 - No s/s angina. - Continue ASA/statin.   4. T2DM: - Hgb A1C 9.1 (6/22). - now on insulin - Not a candidate for SGLT2i (GFR and hx of urosepsis).   5. HTN: - Stable today. - Bidil as above.   Length of Stay: 2  Katherine Roan, MD  05/13/2021, 11:13 AM  Advanced Heart Failure Team Pager [phone] (M-F; 7a - 4p)  Please contact [facility] for night-coverage after hours (4p -7a )  and weekends on [email or web address]   Patient seen and examined with the above-signed Advanced Practice Provider and/or Housestaff. I personally reviewed laboratory data, imaging studies and relevant notes. I independently examined the patient and formulated the important aspects of the plan. I have edited the note to reflect any of my changes or salient points. I have personally discussed the plan with the patient and/or family.  65 y/o woman with advanced systolic HF and CKD IV with multiple recent admits for HF and renal failure.   Readmitted with AKI and hyperkalemia in setting of volume depletion.  Continues to struggle at home with weakness and poor appetite.  Creatinine now improving with holding diuretics. ReDS 36% (well compensated fluid)  General:  Elderly Cachetic Weak appearing. No resp difficulty HEENT: normal Neck: supple. JVP 8-9 prominent v waves  Carotids 2+ bilat; no bruits. No lymphadenopathy or thryomegaly appreciated. Cor: PMI nondisplaced. Regular rate & rhythm. +s3 Lungs: clear Abdomen: soft, nontender, nondistended. No  hepatosplenomegaly. No bruits or masses. Good bowel sounds. Extremities: no cyanosis, clubbing, rash, edema Neuro: alert & orientedx3, cranial nerves grossly intact. moves all 4 extremities w/o difficulty. Affect pleasant  Volume status currently ok but remains very tenuous. I do not think she would tolerate HD with her degree of HF. Have again suggested Palliative Care involvement.   Volume status very hard to balance in setting of advanced HF and renal failure. Would change Torsemide to 40 daily and decrease metolazone to once weekly. Will get Paramedicine involved to help manage as outpatient.   OK for d/c from our standpoint.   Glori Bickers, MD  2:30 PM

## 2021-05-13 NOTE — Progress Notes (Signed)
Mobility Specialist: Progress Note   05/13/21 1239  Mobility  Activity Ambulated in hall  Level of Assistance Standby assist, set-up cues, supervision of patient - no hands on  Assistive Device Front wheel walker  Distance Ambulated (ft) 350 ft  Mobility Ambulated with assistance in hallway  Mobility Response Tolerated well  Mobility performed by Mobility specialist  $Mobility charge 1 Mobility   Pre-Mobility:            On 2 L/min: 86 HR, 126/68 BP, 100% SpO2           On RA: 83% SpO2 During Mobility on 2 L/min [place]: 95 HR, 100% SpO2 Post-Mobility on 2 L/min [place]: 91 HR, 131/70 BP, 100% SpO2  Pt on 2 L/min [place] upon entering room. Pt's sats at 100% so began without supplemental O2. Pt sat EOB and pt's sats dropped to 83% SpO2, pt asx. Pt ambulated on 2 L/min [place] with sats maintaining 98-100%. Pt c/o feeling a little light headed but otherwise asx. Pt is sitting EOB after walk to eat lunch with call bell at her side and pt's husband present in the room.   [facility] Nibert Mobility Specialist Mobility Specialist Phone: [phone]

## 2021-05-13 NOTE — Progress Notes (Signed)
Beverly Martin Progress Note   65 y.o. female with PMHx of CAD s/p CABG in [id number], combined systolic and diastolic heart failure (EF 20-25%), type II diabetes mellitus, hypertension, CKD IV (baseline sCr 2.9-3.2) mult admissions for acute on chronic systolic HF.  She was just in the hospital and responded nicely to diuretics leaving  at 111.8 lbs. When she left the hospital on 04/30/2021 her Cr was 3.6. She's presenting with abnormal labs and was noted to have a Cr of 4.61 with hyperkalemia and a  CXR showing b/l pleural effusions. She is on torsemide (40/20 mg am/pm)  and metolazone (2.'5mg'$  on Mon and Fri) but was told by her PCP to discontinue the diuretics today and come to the ED for evaluation.    Assessment/ Plan:   AKI on CKD IV: Baseline sCr was in the 2.9-3.1 range recently and she was d/c w/ Cr of 3.6 a week ago; h/o cardiorenal syndrome in setting of CKD secondary to hypertension and DM. Renal US 7/13 without obstruction consistent with chronic renal disease. Subnephrotic proteinuria secondary to diabetes mellitus; RAAS blockade/SGLT2- use not possible with current renal function. Has had recent vein mapping; no indication for emergent RRT at this time; however, does appear to be headed towards requiring HD. I'm not convinced she would tolerate dialysis with her cardiac status. - Diuretics have been adjusted several times to achieve stable dose - Continue torsemide '20mg'$  BID - Crt stable at 3.9; possibly new baseline -EDW felt to be around 49-50kg  . We discussed whether in future she would want iHD; I'm not convinced she will tolerate but I wanted to cont the conversation with her. She's not sure at this time and fortunately we don't have to make that decision immediately. - She has an outpt appt with Dr. Justin Martin 8/16 230PM    Acute on chronic combined systolic and diastolic heart failure: suspected to be mixed ischemic and non-ischemic cardiomyopathy complicated by medication  nonadherence in the past. Metabolic alkalosis: Chronic, not a good candidate for diamox due to compromised renal function. No need for urgent RRT at this time. Will continue to trend. Macrocytic anemia w/hx of normocytic anemia: Iron replete. Vitamin B12 and folate wnl. Will likely need to continue Aranesp as outpatient. Type II DM: insulin dosing per primary team   Subjective:   States her breathing is largely unchanged.  No specific complaints.   Objective:   BP 128/73 (BP Location: Right Arm)   Pulse 86   Temp 97.8 F (36.6 C) (Oral)   Resp 20   Ht '5\' 5"'$  (1.651 m)   Wt 49.4 kg   SpO2 100%   BMI 18.14 kg/m   Intake/Output Summary (Last 24 hours) at 05/13/2021 1346 Last data filed at 05/13/2021 [id number] Gross per 24 hour  Intake 240 ml  Output 825 ml  Net -585 ml   Weight change: -1.158 kg  Physical Exam: General:  Elderly appearing  Cor: Normal rate, no obvious murmur Lungs: poor air movement bilaterally, bilateral chest rise Abdomen: soft, nontender, nondistended. Extremities: no cyanosis, clubbing, rash, tr edema Neuro: alert & orientedx3, cranial nerves grossly intact.  Imaging: No results found.  Labs: BMET Recent Labs  Lab 04/17/2021 1024 05/08/2021 1557 04/24/2021 1855 05/11/21 0147 05/11/21 1805 05/12/21 0527 05/12/21 1846 05/13/21 0614  NA 143 141  --  141 140 142 142 143  K 5.7* 5.5*  --  5.6* 5.7* 4.9 4.1 4.3  CL 90* 89*  --  88* 90* 88* 90*  90*  CO2 45* 44*  --  43* 39* 44* 43* 44*  GLUCOSE 214* 238*  --  273* 139* 248* 101* 165*  BUN 112* 111*  --  108* 104* 97* 93* 89*  CREATININE 4.65* 4.61* 4.59* 4.65* 4.21* 4.13* 3.87* 3.94*  CALCIUM 9.7 9.6  --  9.6 9.4 9.2 9.4 9.2  PHOS  --   --  5.8*  --   --   --   --   --    CBC Recent Labs  Lab 05/08/21 1307 04/25/2021 1557 04/27/2021 1855 05/11/21 0147  WBC 5.2 5.1 5.1 5.9  NEUTROABS 4.7 4.5  --  5.2  HGB 8.3* 9.1* 8.7* 9.1*  HCT 25.4* 30.5* 29.6* 30.1*  MCV 101.3* 110.1* 110.0* 108.7*  PLT 159.0 168  148* 148*    Medications:     aspirin  81 mg Oral Daily   atorvastatin  40 mg Oral Daily   calcitRIOL  0.25 mcg Oral Daily   heparin  5,000 Units Subcutaneous BID   insulin aspart  0-9 Units Subcutaneous TID WC   isosorbide-hydrALAZINE  1 tablet Oral TID   lactulose  10 g Oral Daily   torsemide  20 mg Oral BID     Beverly Martin  05/13/2021, 1:46 PM

## 2021-05-13 NOTE — Progress Notes (Signed)
MD on call informed that telemetry noticed that pt has a new ST elevation. Pt denies CP, VSS 129/69 84 100 on 2L EKG shows NSR with left ventricular hypertrophy. Pt resting in bed eating dinner. No new orders given. Will continue to monitor.   Cas Tracz M

## 2021-05-13 NOTE — Progress Notes (Signed)
Patient was on consult list for prayer. Chaplain visited with patient and listened to her concerns about getting  her strength back, getting off the oxygen, and a stronger heart. She wants to go home and is frustrated because she is not ready healthwise. She asked Chaplain to pray for God to give her strength and healing. Chaplain prayed with patient and is available when needed.    05/13/21 1200  Clinical Encounter Type  Visited With Patient  Visit Type Initial  Referral From Nurse  Consult/Referral To Chaplain  Spiritual Encounters  Spiritual Needs Prayer;Emotional  Stress Factors  Patient Stress Factors Health changes

## 2021-05-13 NOTE — Plan of Care (Signed)
  Problem: Education: Goal: Knowledge of General Education information will improve Description Including pain rating scale, medication(s)/side effects and non-pharmacologic comfort measures Outcome: Progressing   

## 2021-05-13 NOTE — Progress Notes (Signed)
PROGRESS NOTE    Beverly Martin  [id number] DOB: 01-Jan-1956 DOA: 04/28/2021 PCP: Billie Ruddy, MD    Chief Complaint  Patient presents with   Abnormal Labs    R/T kidney function    Brief Narrative:    Beverly Martin is a 66 y.o. female with medical history significant for systolic heart failure, diabetes, coronary disease status post CABG, hypertension, hyperlipidemia CKD 4/5 presents to the ED from PCP office shortly after returning home for hyperkalemia and elevated creatinine.  Assessment & Plan:   Principal Problem:   Hyperkalemia Active Problems:   Essential hypertension   Type 2 diabetes mellitus with hyperlipidemia (HCC)   CAD (coronary artery disease)   Elevated troponin   CKD (chronic kidney disease) stage 4, GFR 15-29 ml/min (HCC)   Chronic systolic heart failure (HCC)   Macrocytic anemia   Pressure injury of skin   AKI (acute kidney injury) ([place])    Acute on Stage 4 CKD:  - suspect this is her new baseline. Creatinine around 3.9 today.  - nephrology on board and appreciate recommendations.  - monitor urine output and repeat renal parameters in am.  - pt started on torsemide 20 mg BID.    Chronic systolic and diastolic heart failure  Echocardiogram on 04/25/21 showed Left ventricular ejection fraction, by estimation, is 30 to 35%, with  moderately decreased function,  global hypokinesis and  mild left ventricular  hypertrophy. Left ventricular diastolic parameters are consistent with Grade II diastolic dysfunction. Right ventricular systolic functin is mod reduced with elevated pulm artery systolic pressure.  Continue with bidil. She is started on torsemide 20 mg BID.  Continue with strict intake and output.  Daily weights.  Cardiology consulted, suggested she would not be able to tolerate HD with her degree of HF.  Palliative care will be consulted for goals of care.     Hypertension;  Beverly Martin.    Type 2 dm with stage 4 KD - CBG (last  3)  Recent Labs    05/13/21 0759 05/13/21 1203 05/13/21 1615  GLUCAP 183* 190* 134*    Resume SSI. Hemoglobin A1c is 9.1 in June 2022.  No change in meds.   Hyperkalemia:  Resolved with lokelma.    Metabolic alkalosis:  Monitor.     Anemia of chronic disease:  Hemoglobin between 8 to 9.  Transfuse to keep hemoglobin greater than 7.   Hyperlipidemia:  Continue with lipitor.    CAD:  Denies any chest pain or sob.   Resume aspirin and lipitor.    Stage 1 pressure injury on right buttock.  Pressure Injury 05/08/2021 Buttocks Right;Left Stage 1 -  Intact skin with non-blanchable redness of a localized area usually over a bony prominence. blanchable redness dressing applied to protect area (Active)  04/12/2021 2205  Location: Buttocks  Location Orientation: Right;Left  Staging: Stage 1 -  Intact skin with non-blanchable redness of a localized area usually over a bony prominence.  Wound Description (Comments): blanchable redness dressing applied to protect area  Present on Admission: Yes   Continue with foam dressing.    Mild thrombocytopenia No bleeding seen  DVT prophylaxis: Heparin.  Code Status: full code.  Family Communication: none at bedside.  Disposition:   Status is: Inpatient  Remains inpatient appropriate because:Ongoing diagnostic testing needed not appropriate for outpatient work up, Unsafe d/c plan, and IV treatments appropriate due to intensity of illness or inability to take PO  Dispo: The patient is from: Home  Anticipated d/c is to: Home              Patient currently is not medically stable to d/c.   Difficult to place patient No       Consultants:  Nephrology.   Procedures: none.   Antimicrobials: none.    Subjective: Reports feeling weak. Reports being nauseated. No vomiting or abd pain.  She reports occasional dizziness   Objective: Vitals:   05/12/21 1938 05/13/21 0458 05/13/21 0459 05/13/21 1231  BP: 138/81   128/73   Pulse: 87  86   Resp: 15 (!) 24 20   Temp: 97.9 F (36.6 C)  97.8 F (36.6 C) 98.1 F (36.7 C)  TempSrc: Oral Oral Oral Oral  SpO2: 100%  100%   Weight:   49.4 kg   Height:        Intake/Output Summary (Last 24 hours) at 05/13/2021 1722 Last data filed at 05/13/2021 [id number] Gross per 24 hour  Intake 240 ml  Output 500 ml  Net -260 ml    Filed Weights   05/11/21 0512 05/12/21 0413 05/13/21 0459  Weight: 49.9 kg 50.6 kg 49.4 kg    Examination:  General exam: cachetic looking lady, appears to be comfortable.  Respiratory system: Air entry fair, no wheezing or rhonchi.  Cardiovascular system: RRR, no edema. No JVD.  Gastrointestinal system: Abdomen is soft nontender, non distended, bowel sounds wnl.  Central nervous system: Alert and oriented non focal .  Extremities: no cyanosis or clubbing.  Skin: Stage I pressure injury on the right buttock present Psychiatry: Mood is appropriate.     Data Reviewed: I have personally reviewed following labs and imaging studies  CBC: Recent Labs  Lab 05/08/21 1307 04/30/2021 1557 04/19/2021 1855 05/11/21 0147  WBC 5.2 5.1 5.1 5.9  NEUTROABS 4.7 4.5  --  5.2  HGB 8.3* 9.1* 8.7* 9.1*  HCT 25.4* 30.5* 29.6* 30.1*  MCV 101.3* 110.1* 110.0* 108.7*  PLT 159.0 168 148* 148*     Basic Metabolic Panel: Recent Labs  Lab 05/08/21 1307 05/09/21 0854 04/21/2021 1855 05/11/21 0147 05/11/21 1805 05/12/21 0527 05/12/21 1846 05/13/21 0614  NA 139   < >  --  141 140 142 142 143  K 6.2 No hemolysis seen*   < >  --  5.6* 5.7* 4.9 4.1 4.3  CL 88*   < >  --  88* 90* 88* 90* 90*  CO2 43*   < >  --  43* 39* 44* 43* 44*  GLUCOSE 367*   < >  --  273* 139* 248* 101* 165*  BUN 117*   < >  --  108* 104* 97* 93* 89*  CREATININE 4.48*   < > 4.59* 4.65* 4.21* 4.13* 3.87* 3.94*  CALCIUM 9.8   < >  --  9.6 9.4 9.2 9.4 9.2  MG 2.9*  --   --  2.8*  --   --   --   --   PHOS  --   --  5.8*  --   --   --   --   --    < > = values in this interval not  displayed.     GFR: Estimated Creatinine Clearance: 11 mL/min (A) (by C-G formula based on SCr of 3.94 mg/dL (H)).  Liver Function Tests: Recent Labs  Lab 05/08/21 1307 05/09/21 0854 05/11/21 0147  AST 11 11 13*  ALT '17 16 17  '$ ALKPHOS 82 80 68  BILITOT 0.6 0.6  1.1  PROT 7.2 6.8 6.5  ALBUMIN 4.3 4.2 3.7     CBG: Recent Labs  Lab 05/12/21 1639 05/12/21 2300 05/13/21 0759 05/13/21 1203 05/13/21 1615  GLUCAP 85 193* 183* 190* 134*      Recent Results (from the past 240 hour(s))  Resp Panel by RT-PCR (Flu A&B, Covid) Nasopharyngeal Swab     Status: None   Collection Time: 05/01/2021  6:37 PM   Specimen: Nasopharyngeal Swab; Nasopharyngeal(NP) swabs in vial transport medium  Result Value Ref Range Status   SARS Coronavirus 2 by RT PCR NEGATIVE NEGATIVE Final    Comment: (NOTE) SARS-CoV-2 target nucleic acids are NOT DETECTED.  The SARS-CoV-2 RNA is generally detectable in upper respiratory specimens during the acute phase of infection. The lowest concentration of SARS-CoV-2 viral copies this assay can detect is 138 copies/mL. A negative result does not preclude SARS-Cov-2 infection and should not be used as the sole basis for treatment or other patient management decisions. A negative result may occur with  improper specimen collection/handling, submission of specimen other than nasopharyngeal swab, presence of viral mutation(s) within the areas targeted by this assay, and inadequate number of viral copies(<138 copies/mL). A negative result must be combined with clinical observations, patient history, and epidemiological information. The expected result is Negative.  Fact Sheet for Patients:  EntrepreneurPulse.com.au  Fact Sheet for Healthcare Providers:  IncredibleEmployment.be  This test is no t yet approved or cleared by the [place] FDA and  has been authorized for detection and/or diagnosis of SARS-CoV-2 by FDA  under an Emergency Use Authorization (EUA). This EUA will remain  in effect (meaning this test can be used) for the duration of the COVID-19 declaration under Section 564(b)(1) of the Act, 21 U.S.C.section 360bbb-3(b)(1), unless the authorization is terminated  or revoked sooner.       Influenza A by PCR NEGATIVE NEGATIVE Final   Influenza B by PCR NEGATIVE NEGATIVE Final    Comment: (NOTE) The Xpert Xpress SARS-CoV-2/FLU/RSV plus assay is intended as an aid in the diagnosis of influenza from Nasopharyngeal swab specimens and should not be used as a sole basis for treatment. Nasal washings and aspirates are unacceptable for Xpert Xpress SARS-CoV-2/FLU/RSV testing.  Fact Sheet for Patients: EntrepreneurPulse.com.au  Fact Sheet for Healthcare Providers: IncredibleEmployment.be  This test is not yet approved or cleared by the [place] FDA and has been authorized for detection and/or diagnosis of SARS-CoV-2 by FDA under an Emergency Use Authorization (EUA). This EUA will remain in effect (meaning this test can be used) for the duration of the COVID-19 declaration under Section 564(b)(1) of the Act, 21 U.S.C. section 360bbb-3(b)(1), unless the authorization is terminated or revoked.  Performed at [facility], [place] [address], [place], [place] [address]           Radiology Studies: No results found.      Scheduled Meds:  aspirin  81 mg Oral Daily   atorvastatin  40 mg Oral Daily   calcitRIOL  0.25 mcg Oral Daily   heparin  5,000 Units Subcutaneous BID   insulin aspart  0-9 Units Subcutaneous TID WC   isosorbide-hydrALAZINE  1 tablet Oral TID   lactulose  10 g Oral Daily   torsemide  20 mg Oral BID   Continuous Infusions:   LOS: 2 days        Hosie Poisson, MD [facility]   To contact the attending provider between 7A-7P or the covering provider during after hours 7P-7A, please  log into the web site  [email or web address] and access using universal [place] password for that web site. If you do not have the password, please call the hospital operator.  05/13/2021, 5:22 PM

## 2021-05-13 NOTE — Telephone Encounter (Signed)
Spoke with Beverly Martin on 7/29, informed Dr Volanda Napoleon about the potassium, she advised of issues with pt not taking potassium. Tried to call pt to advise her to take potassium but there was no answer. Pt was at the ED.

## 2021-05-13 DEATH — deceased

## 2021-05-14 DIAGNOSIS — N179 Acute kidney failure, unspecified: Secondary | ICD-10-CM | POA: Diagnosis not present

## 2021-05-14 DIAGNOSIS — E875 Hyperkalemia: Secondary | ICD-10-CM | POA: Diagnosis not present

## 2021-05-14 LAB — RENAL FUNCTION PANEL
Albumin: 3.5 g/dL (ref 3.5–5.0)
Anion gap: 10 (ref 5–15)
BUN: 89 mg/dL — ABNORMAL HIGH (ref 8–23)
CO2: 44 mmol/L — ABNORMAL HIGH (ref 22–32)
Calcium: 9.1 mg/dL (ref 8.9–10.3)
Chloride: 88 mmol/L — ABNORMAL LOW (ref 98–111)
Creatinine, Ser: 4.02 mg/dL — ABNORMAL HIGH (ref 0.44–1.00)
GFR, Estimated: 12 mL/min — ABNORMAL LOW (ref >60)
Glucose, Bld: 204 mg/dL — ABNORMAL HIGH (ref 70–99)
Phosphorus: 6.6 mg/dL — ABNORMAL HIGH (ref 2.5–4.6)
Potassium: 4.6 mmol/L (ref 3.5–5.1)
Sodium: 142 mmol/L (ref 135–145)

## 2021-05-14 LAB — GLUCOSE, CAPILLARY
Glucose-Capillary: 193 mg/dL — ABNORMAL HIGH (ref 70–99)
Glucose-Capillary: 227 mg/dL — ABNORMAL HIGH (ref 70–99)
Glucose-Capillary: 193 mg/dL — ABNORMAL HIGH (ref 70–99)
Glucose-Capillary: 241 mg/dL — ABNORMAL HIGH (ref 70–99)

## 2021-05-14 MED ORDER — TORSEMIDE 20 MG PO TABS
40.0000 mg | ORAL_TABLET | Freq: Every day | ORAL | Status: DC
  Administered 2021-05-15 – 2021-05-19 (×5): 40 mg via ORAL
  Filled 2021-05-14 (×5): qty 2

## 2021-05-14 NOTE — Progress Notes (Signed)
PROGRESS NOTE    Beverly Martin  [id number] DOB: 1956/10/10 DOA: 04/28/2021 PCP: Billie Ruddy, MD    Chief Complaint  Patient presents with   Abnormal Labs    R/T kidney function    Brief Narrative:    Beverly Martin is a 65 y.o. female with medical history significant for systolic heart failure, diabetes, coronary disease status post CABG, hypertension, hyperlipidemia CKD 4/5 presents to the ED from PCP office shortly after returning home for hyperkalemia and elevated creatinine.   Pt seen and examined and discussed palliative care consult.  Discussed about going home. She reports not feeling good today and would like to stay in the hospital for another day.  She denies any chest pain. Reports some sob on ambulation.  Assessment & Plan:   Principal Problem:   Hyperkalemia Active Problems:   Essential hypertension   Type 2 diabetes mellitus with hyperlipidemia (HCC)   CAD (coronary artery disease)   Elevated troponin   CKD (chronic kidney disease) stage 4, GFR 15-29 ml/min (HCC)   Chronic systolic heart failure (HCC)   Macrocytic anemia   Pressure injury of skin   AKI (acute kidney injury) ([place])    Acute on Stage 4 CKD:  - suspect this is her new baseline. Creatinine around 3.9 today.  - nephrology on board and appreciate recommendations.  - monitor urine output and repeat renal parameters in am.  - transitioned to torsemide 40 mg daily and weekly metolazone on discharge.    Chronic systolic and diastolic heart failure  Echocardiogram on 04/25/21 showed Left ventricular ejection fraction, by estimation, is 30 to 35%, with  moderately decreased function,  global hypokinesis and  mild left ventricular  hypertrophy. Left ventricular diastolic parameters are consistent with Grade II diastolic dysfunction. Right ventricular systolic functin is mod reduced with elevated pulm artery systolic pressure.  Continue with bidil. She is transitioned to 40 mg of torsemide.   Continue with strict intake and output.  Daily weights.  Cardiology consulted, suggested she would not be able to tolerate HD with her degree of HF.  Palliative care will be consulted for goals of care.  No new recommendations.    Hypertension;  Well controlled.    Type 2 dm with stage 4 KD - CBG (last 3)  Recent Labs    05/13/21 2127 05/14/21 0739 05/14/21 1135  GLUCAP 139* 193* 227*    Resume SSI. Hemoglobin A1c is 9.1 in June 2022.  No changes in meds.   Hyperkalemia:  Resolved with lokelma.    Metabolic alkalosis:  Monitor.     Anemia of chronic disease:  Hemoglobin between 8 to 9.  Transfuse to keep hemoglobin greater than 7.   Hyperlipidemia:  Continue with lipitor.    CAD:  Denies any chest pain or sob.   Resume aspirin and lipitor.    Stage 1 pressure injury on right buttock.  Pressure Injury 04/19/2021 Buttocks Right;Left Stage 1 -  Intact skin with non-blanchable redness of a localized area usually over a bony prominence. blanchable redness dressing applied to protect area (Active)  04/25/2021 2205  Location: Buttocks  Location Orientation: Right;Left  Staging: Stage 1 -  Intact skin with non-blanchable redness of a localized area usually over a bony prominence.  Wound Description (Comments): blanchable redness dressing applied to protect area  Present on Admission: Yes   Continue with foam dressing.    Mild thrombocytopenia No bleeding evident.   DVT prophylaxis: Heparin.  Code Status: full code.  Family Communication: none at bedside.  Disposition:   Status is: Inpatient  Remains inpatient appropriate because:Ongoing diagnostic testing needed not appropriate for outpatient work up, Unsafe d/c plan, and IV treatments appropriate due to intensity of illness or inability to take PO  Dispo: The patient is from: Home              Anticipated d/c is to: Home              Patient currently is medically stable to d/c.   Difficult to place  patient No       Consultants:  Nephrology.   Procedures: none.   Antimicrobials: none.    Subjective: Reports feeling weak and sob on ambulation.   Objective: Vitals:   05/13/21 2015 05/14/21 0415 05/14/21 0816 05/14/21 1137  BP: 123/69 (!) 111/57 130/65 117/62  Pulse: 86 80 86 81  Resp: (!) '21 17 14 16  '$ Temp: 97.6 F (36.4 C) 97.9 F (36.6 C) (!) 97.4 F (36.3 C) (!) 97.5 F (36.4 C)  TempSrc: Oral Oral Oral Oral  SpO2: 100% 100% 100% 100%  Weight:  48 kg    Height:        Intake/Output Summary (Last 24 hours) at 05/14/2021 1517 Last data filed at 05/14/2021 0800 Gross per 24 hour  Intake 240 ml  Output 300 ml  Net -60 ml    Filed Weights   05/12/21 0413 05/13/21 0459 05/14/21 0415  Weight: 50.6 kg 49.4 kg 48 kg    Examination:  General exam: cachetic looking lady, not in distress, on 1lit of [place] Oxygen.  Respiratory system: clear to auscultation, no wheezing heard.  Cardiovascular system: rrr, no JVD, no pedal edema.  Gastrointestinal system: Abdomen is soft, NT ND BS+ Central nervous system: Alert and able to answer all questions appropriately.  Extremities: no pedal edema.  Skin: Stage I pressure injury on the right buttock present Psychiatry: Mood is appropriate.     Data Reviewed: I have personally reviewed following labs and imaging studies  CBC: Recent Labs  Lab 05/08/21 1307 04/30/2021 1557 04/14/2021 1855 05/11/21 0147  WBC 5.2 5.1 5.1 5.9  NEUTROABS 4.7 4.5  --  5.2  HGB 8.3* 9.1* 8.7* 9.1*  HCT 25.4* 30.5* 29.6* 30.1*  MCV 101.3* 110.1* 110.0* 108.7*  PLT 159.0 168 148* 148*     Basic Metabolic Panel: Recent Labs  Lab 05/08/21 1307 05/09/21 0854 05/01/2021 1855 05/11/21 0147 05/11/21 1805 05/12/21 0527 05/12/21 1846 05/13/21 0614 05/14/21 0754  NA 139   < >  --  141 140 142 142 143 142  K 6.2 No hemolysis seen*   < >  --  5.6* 5.7* 4.9 4.1 4.3 4.6  CL 88*   < >  --  88* 90* 88* 90* 90* 88*  CO2 43*   < >  --  43* 39* 44*  43* 44* 44*  GLUCOSE 367*   < >  --  273* 139* 248* 101* 165* 204*  BUN 117*   < >  --  108* 104* 97* 93* 89* 89*  CREATININE 4.48*   < > 4.59* 4.65* 4.21* 4.13* 3.87* 3.94* 4.02*  CALCIUM 9.8   < >  --  9.6 9.4 9.2 9.4 9.2 9.1  MG 2.9*  --   --  2.8*  --   --   --   --   --   PHOS  --   --  5.8*  --   --   --   --   --  6.6*   < > = values in this interval not displayed.     GFR: Estimated Creatinine Clearance: 10.4 mL/min (A) (by C-G formula based on SCr of 4.02 mg/dL (H)).  Liver Function Tests: Recent Labs  Lab 05/08/21 1307 05/09/21 0854 05/11/21 0147 05/14/21 0754  AST 11 11 13*  --   ALT '17 16 17  '$ --   ALKPHOS 82 80 68  --   BILITOT 0.6 0.6 1.1  --   PROT 7.2 6.8 6.5  --   ALBUMIN 4.3 4.2 3.7 3.5     CBG: Recent Labs  Lab 05/13/21 1203 05/13/21 1615 05/13/21 2127 05/14/21 0739 05/14/21 1135  GLUCAP 190* 134* 139* 193* 227*      Recent Results (from the past 240 hour(s))  Resp Panel by RT-PCR (Flu A&B, Covid) Nasopharyngeal Swab     Status: None   Collection Time: 04/28/2021  6:37 PM   Specimen: Nasopharyngeal Swab; Nasopharyngeal(NP) swabs in vial transport medium  Result Value Ref Range Status   SARS Coronavirus 2 by RT PCR NEGATIVE NEGATIVE Final    Comment: (NOTE) SARS-CoV-2 target nucleic acids are NOT DETECTED.  The SARS-CoV-2 RNA is generally detectable in upper respiratory specimens during the acute phase of infection. The lowest concentration of SARS-CoV-2 viral copies this assay can detect is 138 copies/mL. A negative result does not preclude SARS-Cov-2 infection and should not be used as the sole basis for treatment or other patient management decisions. A negative result may occur with  improper specimen collection/handling, submission of specimen other than nasopharyngeal swab, presence of viral mutation(s) within the areas targeted by this assay, and inadequate number of viral copies(<138 copies/mL). A negative result must be combined  with clinical observations, patient history, and epidemiological information. The expected result is Negative.  Fact Sheet for Patients:  EntrepreneurPulse.com.au  Fact Sheet for Healthcare Providers:  IncredibleEmployment.be  This test is no t yet approved or cleared by the [place] FDA and  has been authorized for detection and/or diagnosis of SARS-CoV-2 by FDA under an Emergency Use Authorization (EUA). This EUA will remain  in effect (meaning this test can be used) for the duration of the COVID-19 declaration under Section 564(b)(1) of the Act, 21 U.S.C.section 360bbb-3(b)(1), unless the authorization is terminated  or revoked sooner.       Influenza A by PCR NEGATIVE NEGATIVE Final   Influenza B by PCR NEGATIVE NEGATIVE Final    Comment: (NOTE) The Xpert Xpress SARS-CoV-2/FLU/RSV plus assay is intended as an aid in the diagnosis of influenza from Nasopharyngeal swab specimens and should not be used as a sole basis for treatment. Nasal washings and aspirates are unacceptable for Xpert Xpress SARS-CoV-2/FLU/RSV testing.  Fact Sheet for Patients: EntrepreneurPulse.com.au  Fact Sheet for Healthcare Providers: IncredibleEmployment.be  This test is not yet approved or cleared by the [place] FDA and has been authorized for detection and/or diagnosis of SARS-CoV-2 by FDA under an Emergency Use Authorization (EUA). This EUA will remain in effect (meaning this test can be used) for the duration of the COVID-19 declaration under Section 564(b)(1) of the Act, 21 U.S.C. section 360bbb-3(b)(1), unless the authorization is terminated or revoked.  Performed at [facility], [place] [address] [place], [place] [address]           Radiology Studies: No results found.      Scheduled Meds:  aspirin  81 mg Oral Daily   atorvastatin  40 mg Oral Daily   calcitRIOL  0.25  mcg Oral Daily    heparin  5,000 Units Subcutaneous BID   insulin aspart  0-9 Units Subcutaneous TID WC   isosorbide-hydrALAZINE  1 tablet Oral TID   lactulose  10 g Oral Daily   [START ON 05/15/2021] torsemide  40 mg Oral Daily   Continuous Infusions:   LOS: 3 days        Hosie Poisson, MD [facility]   To contact the attending provider between 7A-7P or the covering provider during after hours 7P-7A, please log into the web site [email or web address] and access using universal [place] password for that web site. If you do not have the password, please call the hospital operator.  05/14/2021, 3:17 PM

## 2021-05-14 NOTE — Progress Notes (Signed)
Beverly Martin Progress Note   65 y.o. female with PMHx of CAD s/p CABG in [id number], combined systolic and diastolic heart failure (EF 20-25%), type II diabetes mellitus, hypertension, CKD IV (baseline sCr 2.9-3.2) mult admissions for acute on chronic systolic HF.  She was just in the hospital and responded nicely to diuretics leaving  at 111.8 lbs. When she left the hospital on 04/30/2021 her Cr was 3.6. She's presenting with abnormal labs and was noted to have a Cr of 4.61 with hyperkalemia and a  CXR showing b/l pleural effusions. She is on torsemide (40/20 mg am/pm)  and metolazone (2.'5mg'$  on Mon and Fri) but was told by her PCP to discontinue the diuretics today and come to the ED for evaluation.    Assessment/ Plan:   AKI on CKD IV: Baseline sCr was in the 2.9-3.1 range recently and she was d/c w/ Cr of 3.6 a week ago; h/o cardiorenal syndrome in setting of CKD secondary to hypertension and DM. Renal US 7/13 without obstruction consistent with chronic renal disease. Subnephrotic proteinuria secondary to diabetes mellitus; RAAS blockade/SGLT2- use not possible with current renal function. Has had recent vein mapping; no indication for emergent RRT at this time; however, does appear to be headed towards requiring HD. I'm not convinced she would tolerate dialysis with her cardiac status. - Diuretics have been adjusted several times to achieve stable dose -Switch torsemide to 40 mg daily -Consider metolazone weekly outpatient as recommended by cardiology - Crt nearly stable at 4; possibly new baseline -EDW felt to be around 49-50kg; today was below, ctm weights  . We discussed whether in future she would want iHD; I'm not convinced she will tolerate but I wanted to cont the conversation with her. She's not sure at this time and fortunately we don't have to make that decision immediately. - She has an outpt appt with Dr. Justin Mend 8/16 230PM    Acute on chronic combined systolic and diastolic  heart failure: suspected to be mixed ischemic and non-ischemic cardiomyopathy complicated by medication nonadherence in the past. Metabolic alkalosis: Chronic, not a good candidate for diamox due to compromised renal function. No need for urgent RRT at this time. Will continue to trend. Macrocytic anemia w/hx of normocytic anemia: Iron replete. Vitamin B12 and folate wnl. Will likely need to continue Aranesp as outpatient. Type II DM: insulin dosing per primary team   Subjective:   Weight today 48 kg slightly lower than yesterday.  Patient states that she feels fine with minimal shortness of breath.   Objective:   BP 130/65 (BP Location: Right Arm)   Pulse 86   Temp (!) 97.4 F (36.3 C) (Oral)   Resp 14   Ht '5\' 5"'$  (1.651 m)   Wt 48 kg   SpO2 100%   BMI 17.62 kg/m   Intake/Output Summary (Last 24 hours) at 05/14/2021 1122 Last data filed at 05/14/2021 0800 Gross per 24 hour  Intake 240 ml  Output 300 ml  Net -60 ml   Weight change: -1.406 kg  Physical Exam: General:  Elderly appearing, thin, sitting on side of bed Cor: Normal rate, no obvious murmur Lungs: poor air movement bilaterally, bilateral chest rise Abdomen: soft, nontender, nondistended. Extremities: no cyanosis, clubbing, rash, tr edema Neuro: alert & orientedx3, cranial nerves grossly intact.  Imaging: No results found.  Labs: BMET Recent [employer] 04/28/2021 1557 04/13/2021 1855 05/11/21 0147 05/11/21 1805 05/12/21 [id number] 05/12/21 1846 05/13/21 [id number] 05/14/21 0754  NA 141  --  141 140 142 142 143 142  K 5.5*  --  5.6* 5.7* 4.9 4.1 4.3 4.6  CL 89*  --  88* 90* 88* 90* 90* 88*  CO2 44*  --  43* 39* 44* 43* 44* 44*  GLUCOSE 238*  --  273* 139* 248* 101* 165* 204*  BUN 111*  --  108* 104* 97* 93* 89* 89*  CREATININE 4.61* 4.59* 4.65* 4.21* 4.13* 3.87* 3.94* 4.02*  CALCIUM 9.6  --  9.6 9.4 9.2 9.4 9.2 9.1  PHOS  --  5.8*  --   --   --   --   --  6.6*   CBC Recent Labs  Lab 05/08/21 1307 05/09/2021 1557  04/13/2021 1855 05/11/21 0147  WBC 5.2 5.1 5.1 5.9  NEUTROABS 4.7 4.5  --  5.2  HGB 8.3* 9.1* 8.7* 9.1*  HCT 25.4* 30.5* 29.6* 30.1*  MCV 101.3* 110.1* 110.0* 108.7*  PLT 159.0 168 148* 148*    Medications:     aspirin  81 mg Oral Daily   atorvastatin  40 mg Oral Daily   calcitRIOL  0.25 mcg Oral Daily   heparin  5,000 Units Subcutaneous BID   insulin aspart  0-9 Units Subcutaneous TID WC   isosorbide-hydrALAZINE  1 tablet Oral TID   lactulose  10 g Oral Daily   torsemide  20 mg Oral BID     Reesa Chew  05/14/2021, 11:22 AM

## 2021-05-14 NOTE — Progress Notes (Addendum)
Advanced Heart Failure Team Rounding Note   Primary Physician:Banks, Larene Beach Primary Cardiologist:  Nahser, Rito Ehrlich, Jevan Gaunt AHF  Reason for Consultation: Chronic Combined Systolic and Diastolic CHF  HPI:    Wt down 3lbs unsure if accurate.  Labs pending.  She reports breathing is stable, appetite a little better this am.  Denies chest pain, orthopnea, PND.    Objective:    Vital Signs:   Temp:  [97.6 F (36.4 C)-98.1 F (36.7 C)] 97.9 F (36.6 C) (08/02 0415) Pulse Rate:  [80-86] 80 (08/02 0415) Resp:  [17-21] 17 (08/02 0415) BP: (111-123)/(57-69) 111/57 (08/02 0415) SpO2:  [100 %] 100 % (08/02 0415) Weight:  [48 kg] 48 kg (08/02 0415) Last BM Date: 04/30/21  Weight change: Filed Weights   05/12/21 0413 05/13/21 0459 05/14/21 0415  Weight: 50.6 kg 49.4 kg 48 kg    Intake/Output:   Intake/Output Summary (Last 24 hours) at 05/14/2021 0758 Last data filed at 05/13/2021 1735 Gross per 24 hour  Intake 120 ml  Output 300 ml  Net -180 ml      Physical Exam    General:  cachectic, No resp difficulty HEENT: normal Neck: supple. JVP 8 . Carotids 2+ bilat; no bruits. No lymphadenopathy or thyromegaly appreciated. Cor: PMI nondisplaced. Regular rate & rhythm. No rubs, gallops or murmurs. Lungs: rales to mid lung fields bilaterally Abdomen: soft, nontender, nondistended. No hepatosplenomegaly. No bruits or masses. Good bowel sounds. Extremities: no cyanosis, clubbing, rash, edema Neuro: alert & orientedx3, cranial nerves grossly intact. moves all 4 extremities w/o difficulty. Affect pleasant   Telemetry   NSR rate 80's Personally reviewed   Labs   Basic Metabolic Panel: Recent Labs  Lab 05/08/21 1307 05/09/21 0854 05/12/2021 1855 05/11/21 0147 05/11/21 1805 05/12/21 0527 05/12/21 1846 05/13/21 0614  NA 139   < >  --  141 140 142 142 143  K 6.2 No hemolysis seen*   < >  --  5.6* 5.7* 4.9 4.1 4.3  CL 88*   < >  --  88* 90* 88* 90* 90*   CO2 43*   < >  --  43* 39* 44* 43* 44*  GLUCOSE 367*   < >  --  273* 139* 248* 101* 165*  BUN 117*   < >  --  108* 104* 97* 93* 89*  CREATININE 4.48*   < > 4.59* 4.65* 4.21* 4.13* 3.87* 3.94*  CALCIUM 9.8   < >  --  9.6 9.4 9.2 9.4 9.2  MG 2.9*  --   --  2.8*  --   --   --   --   PHOS  --   --  5.8*  --   --   --   --   --    < > = values in this interval not displayed.    Liver Function Tests: Recent Labs  Lab 05/08/21 1307 05/09/21 0854 05/11/21 0147  AST 11 11 13*  ALT '17 16 17  '$ ALKPHOS 82 80 68  BILITOT 0.6 0.6 1.1  PROT 7.2 6.8 6.5  ALBUMIN 4.3 4.2 3.7   No results for input(s): LIPASE, AMYLASE in the last 168 hours. No results for input(s): AMMONIA in the last 168 hours.  CBC: Recent Labs  Lab 05/08/21 1307 04/25/2021 1557 05/06/2021 1855 05/11/21 0147  WBC 5.2 5.1 5.1 5.9  NEUTROABS 4.7 4.5  --  5.2  HGB 8.3* 9.1* 8.7* 9.1*  HCT 25.4* 30.5* 29.6* 30.1*  MCV  101.3* 110.1* 110.0* 108.7*  PLT 159.0 168 148* 148*    Cardiac Enzymes: No results for input(s): CKTOTAL, CKMB, CKMBINDEX, TROPONINI in the last 168 hours.  BNP: BNP (last 3 results) Recent Labs    04/24/21 1223 04/13/2021 1714 04/12/2021 1850  BNP >4,500.0* >4,500.0* >4,500.0*    ProBNP (last 3 results) Recent Labs    12/06/20 0927  PROBNP >5000.0*     CBG: Recent Labs  Lab 05/13/21 0759 05/13/21 1203 05/13/21 1615 05/13/21 2127 05/14/21 0739  GLUCAP 183* 190* 134* 139* 193*    Coagulation Studies: No results for input(s): LABPROT, INR in the last 72 hours.   Imaging   No results found.   Medications:     Current Medications:  aspirin  81 mg Oral Daily   atorvastatin  40 mg Oral Daily   calcitRIOL  0.25 mcg Oral Daily   heparin  5,000 Units Subcutaneous BID   insulin aspart  0-9 Units Subcutaneous TID WC   isosorbide-hydrALAZINE  1 tablet Oral TID   lactulose  10 g Oral Daily   torsemide  20 mg Oral BID    Infusions:    Assessment/Plan   1. Chronic Combined  Systolic and Diastolic CHF: - Likely mixed ischemic and NICM, poorly controlled HTN, poor medication adherence may also have progressive CAD but unable to do coronary angio due to CKD 4. - Echo (12/2020): EF down from 45-50% in 2019 to 20-25%, RV moderately reduced - Echo (04/25/21): EF 30-35%, G2DD, moderately reduced RV function. - GDMT limited by renal function. - NYHA III symptoms, no LE edema, bibasilar rales but no real impressive JVD - Nephrology managing diuretic therapy on 20 torsemide BID, would consider switching to 40 daily and metolazone 2.5 weekly on d/c.   - Continue bidil 1 tab tid. - Not candidate for advanced HF therapies. Has refused Palliative services in past.  - Recommend continued ongoing goals of care discussion/palliative discussions.     2. AKI on CKD Stage 4: - SCr baseline 3.6. Up to 4.6 on admit day but improved with holding diuretics today's value pending  - Will likely need HD in near future if she can tolerate it. Vein mapping completed during last admit. - Nephrology following, also not convinced she will tolerate HD.  3. CAD s/p CABG 2005 - No s/s angina. - Continue ASA/statin.   4. T2DM: - Hgb A1C 9.1 (6/22). - now on insulin - Not a candidate for SGLT2i (GFR and hx of urosepsis).   5. HTN: - Stable today. - Bidil as above.   Length of Stay: 3  Katherine Roan, MD  05/14/2021, 7:58 AM  Advanced Heart Failure Team Pager [phone] (M-F; 7a - 4p)  Please contact [facility] for night-coverage after hours (4p -7a ) and weekends on [email or web address]   Patient seen and examined with the above-signed Advanced Practice Provider and/or Housestaff. I personally reviewed laboratory data, imaging studies and relevant notes. I independently examined the patient and formulated the important aspects of the plan. I have edited the note to reflect any of my changes or salient points. I have personally discussed the plan with the patient and/or family.  Feels ok.  Weak. Denies SOB. Continues to perseverate about her disability paperwork.   Creatinine stable ~ 4.0.  General:  Weak appearing. Cachetic No resp difficulty HEENT: normal Neck: supple. JVP 7 Carotids 2+ bilat; no bruits. No lymphadenopathy or thryomegaly appreciated. Cor: PMI nondisplaced. Regular rate & rhythm. + s3 Lungs: clear Abdomen: soft,  nontender, nondistended. No hepatosplenomegaly. No bruits or masses. Good bowel sounds. Extremities: no cyanosis, clubbing, rash, edema Neuro: alert & orientedx3, cranial nerves grossly intact. moves all 4 extremities w/o difficulty. Affect pleasant  She is end-stage. Volume status looks about as good as we can get it. Have recommended Palliative Care again but she continues to be resistant.   I have nothing else to offer her unfortunately. Would consider cutting diuretics back to torsemide 40 daily and metolazone 2.5 only once a week.   She is not candidate for advanced HF therapies.   Glori Bickers, MD  9:09 AM

## 2021-05-14 NOTE — TOC Initial Note (Addendum)
Transition of Care (TOC) - Initial/Assessment Note  Heart Failure   Patient Details  Name: Beverly Martin MRN: [id number] Date of Birth: Jun 06, 1956  Transition of Care [facility]) CM/SW Contact:    [employer], [place] Phone Number: 05/14/2021, 10:59 AM  Clinical Narrative:                 CSW spoke with the patient at bedside and completed a very brief SDOH with the patient who reported that she needs to get paperwork to her employer so they know she is in the hospital and they will keep paying the short term disability. Beverly Martin reported that she isn't sure exactly what it is that she needs to get to them and reported that she was working at [employer] which used to be [employer] in [place], [place]. Beverly Martin gave permission to contact her husband Beverly Martin and her employer and speak with a Beverly Martin, [phone]. CSW called Mr. Duffell [phone] and he reported he would be by later today to visit his wife and can speak then.   CSW met with the patient and her husband and sister at the patients bedside. CSW spoke with the patients sister and gained insight regarding the patient and her husband. CSW provided Mrs. Beverly Martin with [place] records number [phone] if she will need records sent to her employer. CSW outreached Beverly Martin [phone] to ask about the needed paperwork for her short term disability however she did not answer the phone and CSW left a voicemail for her to return the call.  CSW will continue to follow throughout discharge.      Barriers to Discharge: Continued Medical Work up   Patient Goals and CMS Choice        Expected Discharge Plan and Services   In-house Referral: Clinical Social Work     Living arrangements for the past 2 months: Single Family Home                                      Prior Living Arrangements/Services Living arrangements for the past 2 months: Single Family Home Lives with::  Self, Spouse Patient language and need for interpreter reviewed:: Yes        Need for Family Participation in Patient Care: No (Comment) Care giver support system in place?: No (comment)   Criminal Activity/Legal Involvement Pertinent to Current Situation/Hospitalization: No - Comment as needed  Activities of Daily Living Home Assistive Devices/Equipment: Eyeglasses, Oxygen, Walker (specify type) ADL Screening (condition at time of admission) Patient's cognitive ability adequate to safely complete daily activities?: Yes Is the patient deaf or have difficulty hearing?: No Does the patient have difficulty seeing, even when wearing glasses/contacts?: No Does the patient have difficulty concentrating, remembering, or making decisions?: No Patient able to express need for assistance with ADLs?: Yes Does the patient have difficulty dressing or bathing?: No Independently performs ADLs?: Yes (appropriate for developmental age) Does the patient have difficulty walking or climbing stairs?: Yes Weakness of Legs: Both Weakness of Arms/Hands: None  Permission Sought/Granted                  Emotional Assessment Appearance:: Appears stated age Attitude/Demeanor/Rapport: Engaged Affect (typically observed): Pleasant Orientation: : Oriented to Self, Oriented to Place, Oriented to  Time, Oriented to Situation   Psych Involvement: No (comment)  Admission diagnosis:  Hyperkalemia [E87.5] Chest tightness [R07.89] AKI (acute kidney  injury) [facility]) [N17.9] Patient Active Problem List   Diagnosis Date Noted   Pressure injury of skin 05/11/2021   AKI (acute kidney injury) ([place]) 05/11/2021   Acute on chronic combined systolic (congestive) and diastolic (congestive) heart failure ([place]) 04/24/2021   Hyperkalemia 04/24/2021   Macrocytic anemia 04/24/2021   Thrombocytopenia ([place]) 04/24/2021   Hyperglycemia    Acute on chronic combined systolic and diastolic CHF, NYHA class 3 ([place]) 01/17/2021    Chronic systolic heart failure ([place]) 01/16/2021   CKD (chronic kidney disease) stage 4, GFR 15-29 ml/min (HCC) 01/04/2021   CHF (congestive heart failure) ([place]) 92/92/4462   Acute systolic heart failure ([place]) 06/29/2020   Acute CHF (congestive heart failure) ([place]) 06/10/2020   Anasarca 06/10/2020   Dyslipidemia 06/10/2020   Positive blood culture 10/21/2017   UTI (urinary tract infection) 10/20/2017   Sepsis ([place]) 10/20/2017   Acute kidney injury superimposed on chronic kidney disease ([place]) 10/20/2017   Elevated troponin 10/20/2017   Essential hypertension    Type 2 diabetes mellitus with hyperlipidemia (HCC)    CAD (coronary artery disease)    PCP:  Billie Ruddy, MD Pharmacy:   [id number] [place], [place] [address] [place] [address] Phone: [phone] Fax: [phone]  [facility] [address] [place] [address] Phone: [phone] Fax: [phone]     Social Determinants of Health (SDOH) Interventions Food Insecurity Interventions: Intervention Not Indicated Financial Strain Interventions: Intervention Not Indicated Housing Interventions: Intervention Not Indicated Transportation Interventions: Intervention Not Indicated  Readmission Risk Interventions Readmission Risk Prevention Plan 04/30/2021 03/15/2021 03/15/2021  Transportation Screening Complete Complete Complete  PCP or Specialist Appt within 5-7 Days - Complete Complete  Home Care Screening - Complete Complete  Medication Review (RN CM) - Complete Complete  Medication Review ([place]) Complete - -  PCP or Specialist appointment within 3-5 days of discharge Complete - -  [place] or Home Care Consult Complete - -  SW Recovery Care/Counseling Consult Complete - -  Palliative Care Screening Not Applicable - -  [place] Not Applicable - -  Some recent data might be hidden    Kaimen Peine, MSW, [place] Heart Failure Social Worker

## 2021-05-14 NOTE — Progress Notes (Signed)
Mobility Specialist: Progress Note   05/14/21 1738  Mobility  Activity Ambulated in hall  Level of Assistance Modified independent, requires aide device or extra time  Assistive Device Front wheel walker  Distance Ambulated (ft) 350 ft  Mobility Ambulated with assistance in hallway  Mobility Response Tolerated well  Mobility performed by Mobility specialist  Bed Position Chair  $Mobility charge 1 Mobility   Pre-Mobility: 83 HR, 100% SpO2 Post-Mobility: 88 HR, 100% SpO2  Pt independent to stand from chair. Pt ambulated on 2 L/min [place], asx throughout. Pt with slow gait today. Pt back to recliner with call bell and phone at her side.   [facility] Tatisha Cerino Mobility Specialist Mobility Specialist Phone: [phone]

## 2021-05-14 NOTE — Progress Notes (Signed)
Physical Therapy Treatment Patient Details Name: Beverly Martin MRN: [id number] DOB: [birth year] Today's Date: 05/14/2021    History of Present Illness 65 y.o. female presents to [facility] ED on 04/28/2021 for SOB, chest tightness and LE edema. Pt admitted for management of CHF. PMH includes systolic heart failure, diabetes, coronary disease status post CABG, hypertension, hyperlipidemia CKD 4/5.    PT Comments    Pt making steady progress with mobility. Pt with c/o feeling weak and light headed and reports she is eating very little. Pt will need to incr he intake if she is to regain strength.    Follow Up Recommendations  Home health PT;Supervision - Intermittent     Equipment Recommendations  None recommended by PT    Recommendations for Other Services       Precautions / Restrictions Precautions Precautions: Fall Precaution Comments: on 1L at baseline per pt report    Mobility  Bed Mobility Overal bed mobility: Modified Independent Bed Mobility: Supine to Sit     Supine to sit: Modified independent (Device/Increase time);HOB elevated     General bed mobility comments: incr time    Transfers Overall transfer level: Modified independent Equipment used: Rolling walker (2 wheeled);None Transfers: Sit to/from Stand Sit to Stand: Modified independent (Device/Increase time)            Ambulation/Gait Ambulation/Gait assistance: Supervision Gait Distance (Feet): 300 Feet Assistive device: Rolling walker (2 wheeled) Gait Pattern/deviations: Step-through pattern;Decreased stride length;Drifts right/left Gait velocity: decr Gait velocity interpretation: 1.31 - 2.62 ft/sec, indicative of limited community ambulator General Gait Details: Supervision for safety and lines. Pt drifting lt (unsure if walker is pulling lt)   Stairs             Wheelchair Mobility    Modified Rankin (Stroke Patients Only)       Balance Overall balance assessment: Needs  assistance Sitting-balance support: No upper extremity supported;Feet supported Sitting balance-Leahy Scale: Normal     Standing balance support: No upper extremity supported Standing balance-Leahy Scale: Fair                              Cognition Arousal/Alertness: Awake/alert Behavior During Therapy: WFL for tasks assessed/performed Overall Cognitive Status: Within Functional Limits for tasks assessed                                        Exercises Other Exercises Other Exercises: Performed repeated sit to stand 2 x 5 reps    General Comments        Pertinent Vitals/Pain Pain Assessment: No/denies pain    Home Living                      Prior Function            PT Goals (current goals can now be found in the care plan section) Progress towards PT goals: Progressing toward goals    Frequency    Min 3X/week      PT Plan Current plan remains appropriate    Co-evaluation              AM-PAC PT "6 Clicks" Mobility   Outcome Measure  Help needed turning from your back to your side while in a flat bed without using bedrails?: None Help needed moving from lying on your back to sitting  on the side of a flat bed without using bedrails?: None Help needed moving to and from a bed to a chair (including a wheelchair)?: None Help needed standing up from a chair using your arms (e.g., wheelchair or bedside chair)?: None Help needed to walk in hospital room?: A Little Help needed climbing 3-5 steps with a railing? : A Little 6 Click Score: 22    End of Session Equipment Utilized During Treatment: Oxygen Activity Tolerance: Patient tolerated treatment well Patient left: in chair;with call bell/phone within reach;with family/visitor present Nurse Communication: Mobility status PT Visit Diagnosis: Other abnormalities of gait and mobility (R26.89)     Time: [id number] PT Time Calculation (min) (ACUTE ONLY): 28  min  Charges:  $Gait Training: 23-37 mins                     [place] Pager [phone] Office [place] 05/14/2021, 2:13 PM

## 2021-05-15 DIAGNOSIS — N179 Acute kidney failure, unspecified: Secondary | ICD-10-CM | POA: Diagnosis not present

## 2021-05-15 DIAGNOSIS — Z515 Encounter for palliative care: Secondary | ICD-10-CM | POA: Diagnosis not present

## 2021-05-15 DIAGNOSIS — E875 Hyperkalemia: Secondary | ICD-10-CM | POA: Diagnosis not present

## 2021-05-15 DIAGNOSIS — R531 Weakness: Secondary | ICD-10-CM

## 2021-05-15 DIAGNOSIS — I5022 Chronic systolic (congestive) heart failure: Secondary | ICD-10-CM

## 2021-05-15 DIAGNOSIS — Z7189 Other specified counseling: Secondary | ICD-10-CM | POA: Diagnosis not present

## 2021-05-15 DIAGNOSIS — N184 Chronic kidney disease, stage 4 (severe): Secondary | ICD-10-CM

## 2021-05-15 LAB — GLUCOSE, CAPILLARY
Glucose-Capillary: 197 mg/dL — ABNORMAL HIGH (ref 70–99)
Glucose-Capillary: 153 mg/dL — ABNORMAL HIGH (ref 70–99)
Glucose-Capillary: 178 mg/dL — ABNORMAL HIGH (ref 70–99)
Glucose-Capillary: 133 mg/dL — ABNORMAL HIGH (ref 70–99)

## 2021-05-15 LAB — RENAL FUNCTION PANEL
Albumin: 3.5 g/dL (ref 3.5–5.0)
Anion gap: 11 (ref 5–15)
BUN: 93 mg/dL — ABNORMAL HIGH (ref 8–23)
CO2: 43 mmol/L — ABNORMAL HIGH (ref 22–32)
Calcium: 9 mg/dL (ref 8.9–10.3)
Chloride: 87 mmol/L — ABNORMAL LOW (ref 98–111)
Creatinine, Ser: 4.26 mg/dL — ABNORMAL HIGH (ref 0.44–1.00)
GFR, Estimated: 11 mL/min — ABNORMAL LOW (ref >60)
Glucose, Bld: 139 mg/dL — ABNORMAL HIGH (ref 70–99)
Phosphorus: 7 mg/dL — ABNORMAL HIGH (ref 2.5–4.6)
Potassium: 4.4 mmol/L (ref 3.5–5.1)
Sodium: 141 mmol/L (ref 135–145)

## 2021-05-15 MED ORDER — SEVELAMER CARBONATE 800 MG PO TABS
800.0000 mg | ORAL_TABLET | Freq: Three times a day (TID) | ORAL | Status: DC
  Administered 2021-05-15 – 2021-05-21 (×14): 800 mg via ORAL
  Filled 2021-05-15 (×16): qty 1

## 2021-05-15 MED ORDER — CHLORHEXIDINE GLUCONATE CLOTH 2 % EX PADS
6.0000 | MEDICATED_PAD | Freq: Every day | CUTANEOUS | Status: DC
  Administered 2021-05-16 – 2021-05-22 (×7): 6 via TOPICAL

## 2021-05-15 MED ORDER — CEFAZOLIN SODIUM-DEXTROSE 1-4 GM/50ML-% IV SOLN
1.0000 g | INTRAVENOUS | Status: AC
  Administered 2021-05-16: 1 g via INTRAVENOUS
  Filled 2021-05-15: qty 50

## 2021-05-15 NOTE — Progress Notes (Signed)
Chief Complaint: Patient was seen in consultation today for tunneled HD catheter  Referring Physician(s): Dr. Joylene Grapes  Supervising Physician: Ruthann Cancer  Patient Status: [facility] - In-pt  History of Present Illness: Beverly Martin is a 65 y.o. female with multiple medical issues including CHF, Dm, HTN, and CKD. Unfortunately she's had progressive renal failure and is now in need of dialysis. IR is asked to place tunneled HD catheter. Pt sitting in bed, talking on phone with sister. Was able to discuss case and plans with sister as well. PMHx, meds, labs, imaging, allergies reviewed. Feels well, no recent fevers, chills, illness.    Past Medical History:  Diagnosis Date   CAD (coronary artery disease)    Chronic kidney disease (CKD) stage G4/A1, severely decreased glomerular filtration rate (GFR) between 15-29 mL/min/1.73 square meter and albuminuria creatinine ratio less than 30 mg/g (HCC)    Combined systolic and diastolic ACC/AHA stage C congestive heart failure ([place])    Diabetes mellitus without complication ([place])    Essential hypertension    Hyperlipidemia     Past Surgical History:  Procedure Laterality Date   CARDIAC SURGERY     CABG   CORONARY ARTERY BYPASS GRAFT     RIGHT HEART CATH N/A 01/18/2021   Procedure: RIGHT HEART CATH;  Surgeon: Jolaine Artist, MD;  Location: [facility];  Service: Cardiovascular;  Laterality: N/A;    Allergies: Patient has no known allergies.  Medications:  Current Facility-Administered Medications:    acetaminophen (TYLENOL) tablet 650 mg, 650 mg, Oral, Q6H PRN **OR** acetaminophen (TYLENOL) suppository 650 mg, 650 mg, Rectal, Q6H PRN, [place], Einar Pheasant, MD   aspirin chewable tablet 81 mg, 81 mg, Oral, Daily, Howington, Einar Pheasant, MD, 81 mg at 05/15/21 0820   atorvastatin (LIPITOR) tablet 40 mg, 40 mg, Oral, Daily, [place], Einar Pheasant, MD, 40 mg at 05/15/21 [id number]   calcitRIOL (ROCALTROL) capsule 0.25 mcg, 0.25 mcg, Oral,  Daily, [place], Einar Pheasant, MD, 0.25 mcg at 05/15/21 0819   [START ON 05/16/2021] ceFAZolin (ANCEF) IVPB 1 g/50 mL premix, 1 g, Intravenous, On Call, Jamauri Kruzel, Lennette Bihari, PA-C   heparin injection 5,000 Units, 5,000 Units, Subcutaneous, BID, [place], Einar Pheasant, MD, 5,000 Units at 05/15/21 0819   insulin aspart (novoLOG) injection 0-9 Units, 0-9 Units, Subcutaneous, TID WC, Howington, Einar Pheasant, MD, 2 Units at 05/15/21 0818   isosorbide-hydrALAZINE (BIDIL) 20-37.5 MG per tablet 1 tablet, 1 tablet, Oral, TID, [place], Einar Pheasant, MD, 1 tablet at 05/15/21 0820   lactulose (CHRONULAC) 10 GM/15ML solution 10 g, 10 g, Oral, Daily, [place], Einar Pheasant, MD, 10 g at 05/15/21 0819   ondansetron (ZOFRAN) injection 4 mg, 4 mg, Intravenous, Q6H PRN, Shalhoub, Sherryll Burger, MD   sevelamer carbonate (RENVELA) tablet 800 mg, 800 mg, Oral, TID WC, Reesa Chew, MD   torsemide [facility]) tablet 40 mg, 40 mg, Oral, Daily, Reesa Chew, MD, 40 mg at 05/15/21 0820    Family History  Problem Relation Age of Onset   Diabetes Mellitus II Mother    Esophageal cancer Father     Social History   Socioeconomic History   Marital status: Married    Spouse name: Not on file   Number of children: Not on file   Years of education: Not on file   Highest education level: Not on file  Occupational History   Not on file  Tobacco Use   Smoking status: Never   Smokeless tobacco: Never  Vaping Use   Vaping  Use: Never used  Substance and Sexual Activity   Alcohol use: No   Drug use: No   Sexual activity: Not Currently  Other Topics Concern   Not on file  Social History Narrative   Not on file   Social Determinants of Health   Financial Resource Strain: Low Risk    Difficulty of Paying Living Expenses: Not hard at all  Food Insecurity: No Food Insecurity   Worried About Charity fundraiser in the Last Year: Never true   [place] in the Last Year: Never true  Transportation Needs: No Transportation Needs   Lack  of Transportation (Medical): No   Lack of Transportation (Non-Medical): No  Physical Activity: Insufficiently Active   Days of Exercise per Week: 2 days   Minutes of Exercise per Session: 20 min  Stress: Not on file  Social Connections: Not on file    Review of Systems: A 12 point ROS discussed and pertinent positives are indicated in the HPI above.  All other systems are negative.  Review of Systems  Vital Signs: BP 109/60 (BP Location: Right Arm)   Pulse 79   Temp 97.6 F (36.4 C) (Oral)   Resp 16   Ht '5\' 5"'$  (1.651 m)   Wt 50.1 kg   SpO2 100%   BMI 18.37 kg/m   Physical Exam Constitutional:      General: She is not in acute distress.    Appearance: She is not ill-appearing.  HENT:     Mouth/Throat:     Mouth: Mucous membranes are moist.     Pharynx: Oropharynx is clear.  Cardiovascular:     Rate and Rhythm: Normal rate and regular rhythm.     Heart sounds: Normal heart sounds.  Pulmonary:     Effort: Pulmonary effort is normal. No respiratory distress.     Breath sounds: Normal breath sounds.  Neurological:     Comments: Pt resting in bed. Hard of hearing. Had to repeat questions several times. Able to tell me her name, DOB, current place, current month. Seems at times confused or forgetful about what was just discussed.    Imaging: DG Chest 2 View  Result Date: 04/23/2021 CLINICAL DATA:  Chest pain EXAM: CHEST - 2 VIEW COMPARISON:  Choose 12/02/2018 FINDINGS: Prior median sternotomy and CABG. Similar cardiomegaly. Aortic atherosclerosis. Similar size of the small bilateral pleural effusions with adjacent atelectasis. Mild diffuse interstitial opacities. The visualized skeletal structures is unchanged. IMPRESSION: Cardiomegaly with interstitial edema and stable small bilateral pleural effusions. Electronically Signed   By: Dahlia Bailiff MD   On: 04/23/2021 23:32   US RENAL  Result Date: 04/24/2021 CLINICAL DATA:  65 year old female with acute renal  insufficiency. EXAM: RENAL / URINARY TRACT ULTRASOUND COMPLETE COMPARISON:  None. FINDINGS: Right Kidney: Renal measurements: 8.8 x 3.7 x 3.8 cm = volume: 64 mL. There is diffuse increased renal parenchymal echogenicity. No hydronephrosis or shadowing stone. Left Kidney: Renal measurements: 9.3 x 4.6 x 3.6 cm = volume: 81 mL. Diffuse increased renal parenchymal echogenicity. No hydronephrosis or shadowing stone. Bladder: Appears normal for degree of bladder distention. Other: Small ascites. IMPRESSION: 1. Echogenic kidneys may represent medical renal disease. Clinical correlation is recommended. No hydronephrosis or shadowing stone. 2. Small ascites. Electronically Signed   By: Anner Crete M.D.   On: 04/24/2021 20:41   DG Chest Portable 1 View  Result Date: 05/07/2021 CLINICAL DATA:  Chest tightness. EXAM: PORTABLE CHEST 1 VIEW COMPARISON:  PA and lateral  chest 04/23/2021. FINDINGS: There is cardiomegaly without edema. Small bilateral pleural effusions and basilar atelectasis are worse on the left and unchanged. No pneumothorax. The patient is status post CABG. No acute or focal abnormality. IMPRESSION: No change in left greater than right small pleural effusions and basilar atelectasis. Cardiomegaly without edema. Electronically Signed   By: Inge Rise M.D.   On: 05/03/2021 17:48   ECHOCARDIOGRAM COMPLETE  Result Date: 04/25/2021    ECHOCARDIOGRAM REPORT   Patient Name:   DANITA TAPLIN Date of Exam: 04/25/2021 Medical Rec #:  [id number]      Height:       64.0 in Accession #:    [id number]     Weight:       118.1 lb Date of Birth:  Mar 18, 1956      BSA:          1.564 m Patient Age:    50 years       BP:           140/73 mmHg Patient Gender: F              HR:           98 bpm. Exam Location:  Inpatient Procedure: 2D Echo, Cardiac Doppler and Color Doppler Indications:    CHF-Acute Systolic [id number]  History:        Patient has prior history of Echocardiogram examinations, most                 recent  01/03/2021. CHF, CAD; Risk Factors:Diabetes, Hypertension                 and Dyslipidemia.  Sonographer:    Bernadene Person RDCS Referring Phys: [id number] [place]  1. Left ventricular ejection fraction, by estimation, is 30 to 35%. The left ventricle has moderately decreased function. The left ventricle demonstrates global hypokinesis. There is mild left ventricular hypertrophy. Left ventricular diastolic parameters are consistent with Grade II diastolic dysfunction (pseudonormalization).  2. Right ventricular systolic function is moderately reduced. The right ventricular size is mildly enlarged. There is moderately elevated pulmonary artery systolic pressure. The estimated right ventricular systolic pressure is 0000000 mmHg.  3. Left atrial size was moderately dilated.  4. The mitral valve is normal in structure. Trivial mitral valve regurgitation. No evidence of mitral stenosis.  5. The aortic valve is tricuspid. Aortic valve regurgitation is not visualized. No aortic stenosis is present.  6. The inferior vena cava is normal in size with <50% respiratory variability, suggesting right atrial pressure of 8 mmHg. FINDINGS  Left Ventricle: Left ventricular ejection fraction, by estimation, is 30 to 35%. The left ventricle has moderately decreased function. The left ventricle demonstrates global hypokinesis. The left ventricular internal cavity size was normal in size. There is mild left ventricular hypertrophy. Left ventricular diastolic parameters are consistent with Grade II diastolic dysfunction (pseudonormalization). Right Ventricle: The right ventricular size is mildly enlarged. No increase in right ventricular wall thickness. Right ventricular systolic function is moderately reduced. There is moderately elevated pulmonary artery systolic pressure. The tricuspid regurgitant velocity is 3.39 m/s, and with an assumed right atrial pressure of 8 mmHg, the estimated right ventricular systolic pressure  is 0000000 mmHg. Left Atrium: Left atrial size was moderately dilated. Right Atrium: Right atrial size was normal in size. Pericardium: There is no evidence of pericardial effusion. Mitral Valve: The mitral valve is normal in structure. Trivial mitral valve regurgitation. No evidence of mitral valve stenosis. Tricuspid Valve:  The tricuspid valve is normal in structure. Tricuspid valve regurgitation is mild. Aortic Valve: The aortic valve is tricuspid. Aortic valve regurgitation is not visualized. No aortic stenosis is present. Pulmonic Valve: The pulmonic valve was normal in structure. Pulmonic valve regurgitation is mild. Aorta: The aortic root is normal in size and structure. Venous: The inferior vena cava is normal in size with less than 50% respiratory variability, suggesting right atrial pressure of 8 mmHg. IAS/Shunts: No atrial level shunt detected by color flow Doppler.  LEFT VENTRICLE PLAX 2D LVIDd:         4.80 cm      Diastology LVIDs:         3.90 cm      LV e' medial:    3.26 cm/s LV PW:         1.30 cm      LV E/e' medial:  31.0 LV IVS:        0.90 cm      LV e' lateral:   4.39 cm/s LVOT diam:     1.70 cm      LV E/e' lateral: 23.0 LV SV:         43 LV SV Index:   27 LVOT Area:     2.27 cm  LV Volumes (MOD) LV vol d, MOD A2C: 106.0 ml LV vol d, MOD A4C: 97.9 ml LV vol s, MOD A2C: 69.7 ml LV vol s, MOD A4C: 67.4 ml LV SV MOD A2C:     36.3 ml LV SV MOD A4C:     97.9 ml LV SV MOD BP:      35.7 ml RIGHT VENTRICLE RV S prime:     6.47 cm/s TAPSE (M-mode): 1.2 cm LEFT ATRIUM             Index       RIGHT ATRIUM           Index LA diam:        3.70 cm 2.37 cm/m  RA Area:     12.80 cm LA Vol (A2C):   68.1 ml 43.54 ml/m RA Volume:   30.00 ml  19.18 ml/m LA Vol (A4C):   68.4 ml 43.74 ml/m LA Biplane Vol: 69.9 ml 44.69 ml/m  AORTIC VALVE LVOT Vmax:   96.80 cm/s LVOT Vmean:  70.800 cm/s LVOT VTI:    0.188 m  AORTA Ao Root diam: 2.60 cm Ao Asc diam:  2.80 cm MITRAL VALVE                TRICUSPID VALVE MV Area  (PHT): 3.99 cm     TR Peak grad:   46.0 mmHg MV Decel Time: 190 msec     TR Vmax:        339.00 cm/s MV E velocity: 101.00 cm/s MV A velocity: 92.60 cm/s   SHUNTS MV E/A ratio:  1.09         Systemic VTI:  0.19 m                             Systemic Diam: 1.70 cm Loralie Champagne MD Electronically signed by Loralie Champagne MD Signature Date/Time: 04/25/2021/5:10:32 PM    Final    VAS [place] UPPER EXT VEIN MAPPING (PRE-OP AVF)  Result Date: 04/30/2021 [place] MAPPING Patient Name:  YVETTE SILCOTT  Date of Exam:   04/30/2021 Medical Rec #: [id number]       Accession #:  [id number] Date of Birth: 02-04-1956       Patient Gender: F Patient Age:   65Y Exam Location:  [facility] Procedure:      VAS [place] UPPER EXT VEIN MAPPING (PRE-OP AVF) Referring Phys: [id number] DANIEL R BENSIMHON --------------------------------------------------------------------------------  Indications: History of PAD; patient is pre-operative for bypass. Comparison Study: No prior studies. Performing Technologist: Oliver Hum RVT  Examination Guidelines: A complete evaluation includes B-mode imaging, spectral Doppler, color Doppler, and power Doppler as needed of all accessible portions of each vessel. Bilateral testing is considered an integral part of a complete examination. Limited examinations for reoccurring indications may be performed as noted. +-----------------+-------------+----------+--------+ Right Cephalic   Diameter (cm)Depth (cm)Findings +-----------------+-------------+----------+--------+ Shoulder             0.16        0.63            +-----------------+-------------+----------+--------+ Prox upper arm       0.10        0.71            +-----------------+-------------+----------+--------+ Mid upper arm        0.09        0.45            +-----------------+-------------+----------+--------+ Dist upper arm       0.07        0.25            +-----------------+-------------+----------+--------+  Antecubital fossa    0.14        0.24            +-----------------+-------------+----------+--------+ Prox forearm         0.10        0.55            +-----------------+-------------+----------+--------+ Mid forearm          0.14        0.22            +-----------------+-------------+----------+--------+ Dist forearm         0.15        0.26            +-----------------+-------------+----------+--------+ +-----------------+-------------+----------+--------------+ Right Basilic    Diameter (cm)Depth (cm)   Findings    +-----------------+-------------+----------+--------------+ Shoulder             0.10        0.60                  +-----------------+-------------+----------+--------------+ Dist upper arm       0.17        0.73                  +-----------------+-------------+----------+--------------+ Antecubital fossa    0.15        0.58                  +-----------------+-------------+----------+--------------+ Prox forearm         0.06        0.27                  +-----------------+-------------+----------+--------------+ Mid forearm                             not visualized +-----------------+-------------+----------+--------------+ Distal forearm                          not visualized +-----------------+-------------+----------+--------------+ +-----------------+-------------+----------+--------+ Left Cephalic    Diameter (cm)Depth (cm)Findings +-----------------+-------------+----------+--------+  Shoulder             0.25        0.55            +-----------------+-------------+----------+--------+ Prox upper arm       0.16        0.72            +-----------------+-------------+----------+--------+ Mid upper arm        0.09        0.50            +-----------------+-------------+----------+--------+ Dist upper arm       0.07        0.46            +-----------------+-------------+----------+--------+ Antecubital fossa     0.26        0.13   Thrombus +-----------------+-------------+----------+--------+ Prox forearm         0.06        0.30            +-----------------+-------------+----------+--------+ Mid forearm          0.12        0.23            +-----------------+-------------+----------+--------+ Dist forearm         0.05        0.38            +-----------------+-------------+----------+--------+ +-----------------+-------------+----------+--------------+ Left Basilic     Diameter (cm)Depth (cm)   Findings    +-----------------+-------------+----------+--------------+ Shoulder             0.17        0.88                  +-----------------+-------------+----------+--------------+ Prox upper arm       0.19        0.78                  +-----------------+-------------+----------+--------------+ Mid upper arm        0.15        0.80                  +-----------------+-------------+----------+--------------+ Dist upper arm       0.13        0.71                  +-----------------+-------------+----------+--------------+ Antecubital fossa    0.11        0.66                  +-----------------+-------------+----------+--------------+ Prox forearm         0.05        0.25                  +-----------------+-------------+----------+--------------+ Mid forearm                             not visualized +-----------------+-------------+----------+--------------+ Distal forearm                          not visualized +-----------------+-------------+----------+--------------+ *See table(s) above for measurements and observations.  Diagnosing physician: Deitra Mayo MD Electronically signed by Deitra Mayo MD on 04/30/2021 at 5:14:21 PM.    Final     Labs:  CBC: Recent Labs    05/08/21 1307 05/12/2021 1557 04/12/2021 1855 05/11/21 0147  WBC 5.2 5.1 5.1 5.9  HGB 8.3* 9.1* 8.7* 9.1*  HCT 25.4* 30.5*  29.6* 30.1*  PLT 159.0 168 148* 148*     COAGS: Recent Labs    01/18/21 0804  INR 1.1    BMP: Recent Labs    06/19/20 0433 06/19/20 0433 06/21/20 1102 06/25/20 1053 07/11/20 0321 12/06/20 0927 05/12/21 1846 05/13/21 0614 05/14/21 0754 05/15/21 0152  NA 141  --  142 140 144   < > 142 143 142 141  K 4.7  --  5.2 5.0 4.6   < > 4.1 4.3 4.6 4.4  CL 100  --  101 97 102   < > 90* 90* 88* 87*  CO2 32  --  34* 28 36*   < > 43* 44* 44* 43*  GLUCOSE 161*  --  102* 136* 59*   < > 101* 165* 204* 139*  BUN 58*  --  69* 71* 59*   < > 93* 89* 89* 93*  CALCIUM 8.8*  --  9.6 9.2 9.8   < > 9.4 9.2 9.1 9.0  CREATININE 2.83*   < > 3.31* 3.27* 2.76*   < > 3.87* 3.94* 4.02* 4.26*  GFRNONAA 17*   < > 14* 14* 17*   < > 12* 12* 12* 11*  GFRAA 19*  --  16* 16* 20*  --   --   --   --   --    < > = values in this interval not displayed.    LIVER FUNCTION TESTS: Recent Labs    03/14/21 0934 04/26/21 0346 05/08/21 1307 05/09/21 0854 05/11/21 0147 05/14/21 0754 05/15/21 0152  BILITOT 0.9  --  0.6 0.6 1.1  --   --   AST 12*  --  11 11 13*  --   --   ALT 11  --  '17 16 17  '$ --   --   ALKPHOS 75  --  82 80 68  --   --   PROT 6.8  --  7.2 6.8 6.5  --   --   ALBUMIN 3.8   < > 4.3 4.2 3.7 3.5 3.5   < > = values in this interval not displayed.    TUMOR MARKERS: No results for input(s): AFPTM, CEA, CA199, CHROMGRNA in the last 8760 hours.  Assessment and Plan: Progressive CKD now in need of dialysis. IR requested to place tunneled HD cath. Discussed with pt plan for procedure as well as the procedure itself. She seems to understand and is agreeable.  Also discussed with the sister on telephone, who is also agreeable and understands the procedure and plan, but states that the pt makes her own medical decisions. Risks and benefits of image guided hemodialysis catheter placement was discussed with the patient and sister including, but not limited to bleeding, infection, pneumothorax, or fibrin sheath development and need for additional  procedures.  All of the patient's questions were answered, patient is agreeable to proceed. Consent signed and in chart by the patient.   Thank you for this interesting consult.  I greatly enjoyed meeting Beverly Martin and look forward to participating in their care.  A copy of this report was sent to the requesting provider on this date.  Electronically Signed: Ascencion Dike, PA-C 05/15/2021, 11:44 AM   I spent a total of 20 minutes in face to face in clinical consultation, greater than 50% of which was counseling/coordinating care for HD catheter placement

## 2021-05-15 NOTE — Progress Notes (Addendum)
Advanced Heart Failure Team Rounding Note   Primary Physician:Banks, Larene Beach Primary Cardiologist:  Nahser, Rito Ehrlich, Hemi Chacko AHF  Reason for Consultation: Chronic Combined Systolic and Diastolic CHF  HPI:    Cr [id number] today, BUN 93.  Reports poor appetite today.  Feels weak and tired. Denies dyspnea, chest pain, PND.    Objective:    Vital Signs:   Temp:  [97.5 F (36.4 C)-97.6 F (36.4 C)] 97.6 F (36.4 C) (08/03 0400) Pulse Rate:  [79-85] 79 (08/02 2020) Resp:  [14-16] 16 (08/03 0400) BP: (105-118)/(53-64) 109/60 (08/03 0400) SpO2:  [100 %] 100 % (08/02 2020) Weight:  [50.1 kg] 50.1 kg (08/03 0400) Last BM Date: 05/13/21  Weight change: Filed Weights   05/13/21 0459 05/14/21 0415 05/15/21 0400  Weight: 49.4 kg 48 kg 50.1 kg    Intake/Output:   Intake/Output Summary (Last 24 hours) at 05/15/2021 0842 Last data filed at 05/14/2021 1700 Gross per 24 hour  Intake 480 ml  Output 500 ml  Net -20 ml      Physical Exam    General:  cachectic, No resp difficulty HEENT: normal Neck: supple. JVP 8 . Carotids 2+ bilat; no bruits. No lymphadenopathy or thyromegaly appreciated. Cor: PMI nondisplaced. Regular rate & rhythm. No rubs, gallops or murmurs. Lungs: bibasilar fine rales Abdomen: soft, nontender, nondistended. No hepatosplenomegaly. No bruits or masses. Good bowel sounds. Extremities: no cyanosis, clubbing, rash, edema Neuro: alert & orientedx3, cranial nerves grossly intact. moves all 4 extremities w/o difficulty. Affect pleasant   Telemetry   NSR rate 80's Personally reviewed   Labs   Basic Metabolic Panel: Recent Labs  Lab 05/08/21 1307 05/09/21 0854 04/26/2021 1855 05/11/21 0147 05/11/21 1805 05/12/21 0527 05/12/21 1846 05/13/21 0614 05/14/21 0754 05/15/21 0152  NA 139   < >  --  141   < > 142 142 143 142 141  K 6.2 No hemolysis seen*   < >  --  5.6*   < > 4.9 4.1 4.3 4.6 4.4  CL 88*   < >  --  88*   < > 88* 90* 90* 88* 87*   CO2 43*   < >  --  43*   < > 44* 43* 44* 44* 43*  GLUCOSE 367*   < >  --  273*   < > 248* 101* 165* 204* 139*  BUN 117*   < >  --  108*   < > 97* 93* 89* 89* 93*  CREATININE 4.48*   < > 4.59* 4.65*   < > 4.13* 3.87* 3.94* 4.02* 4.26*  CALCIUM 9.8   < >  --  9.6   < > 9.2 9.4 9.2 9.1 9.0  MG 2.9*  --   --  2.8*  --   --   --   --   --   --   PHOS  --   --  5.8*  --   --   --   --   --  6.6* 7.0*   < > = values in this interval not displayed.    Liver Function Tests: Recent Labs  Lab 05/08/21 1307 05/09/21 0854 05/11/21 0147 05/14/21 0754 05/15/21 0152  AST 11 11 13*  --   --   ALT '17 16 17  '$ --   --   ALKPHOS 82 80 68  --   --   BILITOT 0.6 0.6 1.1  --   --   PROT 7.2 6.8 6.5  --   --  ALBUMIN 4.3 4.2 3.7 3.5 3.5   No results for input(s): LIPASE, AMYLASE in the last 168 hours. No results for input(s): AMMONIA in the last 168 hours.  CBC: Recent Labs  Lab 05/08/21 1307 04/17/2021 1557 04/17/2021 1855 05/11/21 0147  WBC 5.2 5.1 5.1 5.9  NEUTROABS 4.7 4.5  --  5.2  HGB 8.3* 9.1* 8.7* 9.1*  HCT 25.4* 30.5* 29.6* 30.1*  MCV 101.3* 110.1* 110.0* 108.7*  PLT 159.0 168 148* 148*    Cardiac Enzymes: No results for input(s): CKTOTAL, CKMB, CKMBINDEX, TROPONINI in the last 168 hours.  BNP: BNP (last 3 results) Recent Labs    04/24/21 1223 05/07/2021 1714 04/24/2021 1850  BNP >4,500.0* >4,500.0* >4,500.0*    ProBNP (last 3 results) Recent Labs    12/06/20 0927  PROBNP >5000.0*     CBG: Recent Labs  Lab 05/14/21 0739 05/14/21 1135 05/14/21 1612 05/14/21 2111 05/15/21 0729  GLUCAP 193* 227* 193* 241* 197*    Coagulation Studies: No results for input(s): LABPROT, INR in the last 72 hours.   Imaging   No results found.   Medications:     Current Medications:  aspirin  81 mg Oral Daily   atorvastatin  40 mg Oral Daily   calcitRIOL  0.25 mcg Oral Daily   heparin  5,000 Units Subcutaneous BID   insulin aspart  0-9 Units Subcutaneous TID WC    isosorbide-hydrALAZINE  1 tablet Oral TID   lactulose  10 g Oral Daily   torsemide  40 mg Oral Daily    Infusions:    Assessment/Plan   1. Chronic Combined Systolic and Diastolic CHF: - Likely mixed ischemic and NICM, poorly controlled HTN, poor medication adherence may also have progressive CAD but unable to do coronary angio due to CKD 4. - Echo (12/2020): EF down from 45-50% in 2019 to 20-25%, RV moderately reduced - Echo (04/25/21): EF 30-35%, G2DD, moderately reduced RV function. - GDMT limited by renal function. - NYHA III symptoms, no LE edema, bibasilar rales but stable minimal JVD, REDS 36% on 8/1 - Now on torsemide 40 daily, metolazone once weekly - Continue bidil 1 tab tid. - Not candidate for advanced HF therapies. Has refused Palliative services in past.  - Recommend continued ongoing goals of care discussion/palliative discussions.     2. AKI on CKD Stage 4: - SCr baseline 3.6. Up to 4.6 on admit day now hovering around 4.0, it is 4.26 today - Nephrology will have another discussion with her about dialysis today, concern is that she will not tolerate HD but without it she will continue to decline and do poorly.    3. CAD s/p CABG 2005 - No s/s angina. - Continue ASA/statin.   4. T2DM: - Hgb A1C 9.1 (6/22). - now on insulin - Not a candidate for SGLT2i (GFR and hx of urosepsis).   5. HTN: -continue bidil   Length of Stay: 4  Katherine Roan, MD  05/15/2021, 8:42 AM  Advanced Heart Failure Team Pager [phone] (M-F; 7a - 4p)  Please contact [facility] for night-coverage after hours (4p -7a ) and weekends on [email or web address]   Patient seen and examined with the above-signed Advanced Practice Provider and/or Housestaff. I personally reviewed laboratory data, imaging studies and relevant notes. I independently examined the patient and formulated the important aspects of the plan. I have edited the note to reflect any of my changes or salient points. I have  personally discussed the plan with the patient and/or family.  Feels weak. Anorectic. Creatinine worse  General:  Weak appearing. No resp difficulty HEENT: normal Neck: supple. JVP 6-7 Carotids 2+ bilat; no bruits. No lymphadenopathy or thryomegaly appreciated. Cor: PMI nondisplaced. Regular rate & rhythm. +s3 Lungs: clear Abdomen: soft, nontender, nondistended. No hepatosplenomegaly. No bruits or masses. Good bowel sounds. Extremities: no cyanosis, clubbing, rash, edema Neuro: alert & orientedx3, cranial nerves grossly intact. moves all 4 extremities w/o difficulty. Affect pleasant  She is end-stage. Long discussion with Renal team about next steps. Agree with trial of HD to see if she can tolerate to control uremia and volume status. Given low output HF I am worried she will not tolerate. Could consider RHC to further evaluate cardiac output but doubt this will change what we do as she is not candidate for advanced HF therapies.   Glori Bickers, MD  9:55 AM

## 2021-05-15 NOTE — Progress Notes (Signed)
PROGRESS NOTE    Beverly Martin  [id number] DOB: 02/13/1956 DOA: 04/28/2021 PCP: Billie Ruddy, MD    Chief Complaint  Patient presents with   Abnormal Labs    R/T kidney function    Brief Narrative:    Beverly Martin is a 65 y.o. female with medical history significant for systolic heart failure, diabetes, coronary disease status post CABG, hypertension, hyperlipidemia CKD 4/5 presents to the ED from PCP office shortly after returning home for hyperkalemia and elevated creatinine.  Creatinine continues to worsen and she appears to have tremors in her upper extremities.  She is in good spirits today about trying HD today.  She wanted to me to call her family and let them know.  Assessment & Plan:   Principal Problem:   Hyperkalemia Active Problems:   Essential hypertension   Type 2 diabetes mellitus with hyperlipidemia (HCC)   CAD (coronary artery disease)   Elevated troponin   CKD (chronic kidney disease) stage 4, GFR 15-29 ml/min (HCC)   Chronic systolic heart failure (HCC)   Macrocytic anemia   Pressure injury of skin   AKI (acute kidney injury) ([facility])    Acute on Stage 4 CKD:  - suspect this is her new baseline. Creatinine around 3.9 today.  - nephrology on board and appreciate recommendations.  - monitor urine output and repeat renal parameters in am.  - transitioned to torsemide 40 mg daily and weekly metolazone on discharge.  - plan for a trial of HD tomorrow.  - ir consulted for tunneled HD catheter placement.    Chronic systolic and diastolic heart failure  Echocardiogram on 04/25/21 showed Left ventricular ejection fraction, by estimation, is 30 to 35%, with  moderately decreased function,  global hypokinesis and  mild left ventricular  hypertrophy. Left ventricular diastolic parameters are consistent with Grade II diastolic dysfunction. Right ventricular systolic functin is mod reduced with elevated pulm artery systolic pressure.  Continue with bidil.  She is transitioned to 40 mg of torsemide.  Continue with strict intake and output.  Daily weights.  Cardiology consulted, suggested she would not be able to tolerate HD with her degree of HF.  Palliative care will be consulted for goals of care.     Hypertension;  Controlled well.    Type 2 dm with stage 4 KD - CBG (last 3)  Recent Labs    05/14/21 1612 05/14/21 2111 05/15/21 0729  GLUCAP 193* 241* 197*    Resume SSI. Hemoglobin A1c is 9.1 in June 2022.  No changes in meds.   Hyperkalemia:  Resolved with lokelma.    Metabolic alkalosis:  Bicarb around 43 today.     Anemia of chronic disease:  Hemoglobin between 8 to 9.  Transfuse to keep hemoglobin greater than 7.   Hyperlipidemia:  Continue with lipitor.    CAD:  Denies any chest pain or sob.   Resume aspirin and lipitor.    Stage 1 pressure injury on right buttock.  Pressure Injury 04/24/2021 Buttocks Right;Left Stage 1 -  Intact skin with non-blanchable redness of a localized area usually over a bony prominence. blanchable redness dressing applied to protect area (Active)  04/24/2021 2205  Location: Buttocks  Location Orientation: Right;Left  Staging: Stage 1 -  Intact skin with non-blanchable redness of a localized area usually over a bony prominence.  Wound Description (Comments): blanchable redness dressing applied to protect area  Present on Admission: Yes   Continue with foam dressing.    Mild thrombocytopenia  No bleeding evident. Recheck cbc in am.    DVT prophylaxis: Heparin.  Code Status: full code.  Family Communication: none at bedside. Called her husband and updated the plan.  Disposition:   Status is: Inpatient  Remains inpatient appropriate because:Ongoing diagnostic testing needed not appropriate for outpatient work up, Unsafe d/c plan, and IV treatments appropriate due to intensity of illness or inability to take PO  Dispo: The patient is from: Home              Anticipated d/c  is to: Home              Patient currently is not medically stable to d/c.   Difficult to place patient No       Consultants:  Nephrology.   Procedures: none.   Antimicrobials: none.    Subjective: Appears in good spirits. No chest pain. Some sob and feeling weak .   Objective: Vitals:   05/14/21 1137 05/14/21 1614 05/14/21 2020 05/15/21 0400  BP: 117/62 118/64 (!) 105/53 109/60  Pulse: 81 85 79   Resp: '16 14 15 16  '$ Temp: (!) 97.5 F (36.4 C) (!) 97.5 F (36.4 C) 97.6 F (36.4 C) 97.6 F (36.4 C)  TempSrc: Oral Oral Oral Oral  SpO2: 100% 100% 100%   Weight:    50.1 kg  Height:        Intake/Output Summary (Last 24 hours) at 05/15/2021 1106 Last data filed at 05/14/2021 1700 Gross per 24 hour  Intake 480 ml  Output 500 ml  Net -20 ml    Filed Weights   05/13/21 0459 05/14/21 0415 05/15/21 0400  Weight: 49.4 kg 48 kg 50.1 kg    Examination:  General exam: cachetic looking lady appears comfortable,  Respiratory system: Air entry fair, no wheezing heard. On 1 lit of [place] OXYGEN.  Cardiovascular system: RRR,  murmer present, no pedal edema.  Gastrointestinal system: Abdomen is soft, non tender non distended, bowe sounds wnl.  Central nervous system: alert and oriented, non focal.  Extremities: no leg edema.  Skin: Stage I pressure injury on the right buttock present Psychiatry: Mood is appropriate.     Data Reviewed: I have personally reviewed following labs and imaging studies  CBC: Recent Labs  Lab 05/08/21 1307 04/20/2021 1557 04/29/2021 1855 05/11/21 0147  WBC 5.2 5.1 5.1 5.9  NEUTROABS 4.7 4.5  --  5.2  HGB 8.3* 9.1* 8.7* 9.1*  HCT 25.4* 30.5* 29.6* 30.1*  MCV 101.3* 110.1* 110.0* 108.7*  PLT 159.0 168 148* 148*     Basic Metabolic Panel: Recent Labs  Lab 05/08/21 1307 05/09/21 0854 05/04/2021 1855 05/11/21 0147 05/11/21 1805 05/12/21 0527 05/12/21 1846 05/13/21 0614 05/14/21 0754 05/15/21 0152  NA 139   < >  --  141   < > 142 142 143  142 141  K 6.2 No hemolysis seen*   < >  --  5.6*   < > 4.9 4.1 4.3 4.6 4.4  CL 88*   < >  --  88*   < > 88* 90* 90* 88* 87*  CO2 43*   < >  --  43*   < > 44* 43* 44* 44* 43*  GLUCOSE 367*   < >  --  273*   < > 248* 101* 165* 204* 139*  BUN 117*   < >  --  108*   < > 97* 93* 89* 89* 93*  CREATININE 4.48*   < > 4.59* 4.65*   < >  4.13* 3.87* 3.94* 4.02* 4.26*  CALCIUM 9.8   < >  --  9.6   < > 9.2 9.4 9.2 9.1 9.0  MG 2.9*  --   --  2.8*  --   --   --   --   --   --   PHOS  --   --  5.8*  --   --   --   --   --  6.6* 7.0*   < > = values in this interval not displayed.     GFR: Estimated Creatinine Clearance: 10.3 mL/min (A) (by C-G formula based on SCr of 4.26 mg/dL (H)).  Liver Function Tests: Recent Labs  Lab 05/08/21 1307 05/09/21 0854 05/11/21 0147 05/14/21 0754 05/15/21 0152  AST 11 11 13*  --   --   ALT '17 16 17  '$ --   --   ALKPHOS 82 80 68  --   --   BILITOT 0.6 0.6 1.1  --   --   PROT 7.2 6.8 6.5  --   --   ALBUMIN 4.3 4.2 3.7 3.5 3.5     CBG: Recent Labs  Lab 05/14/21 0739 05/14/21 1135 05/14/21 1612 05/14/21 2111 05/15/21 0729  GLUCAP 193* 227* 193* 241* 197*      Recent Results (from the past 240 hour(s))  Resp Panel by RT-PCR (Flu A&B, Covid) Nasopharyngeal Swab     Status: None   Collection Time: 05/09/2021  6:37 PM   Specimen: Nasopharyngeal Swab; Nasopharyngeal(NP) swabs in vial transport medium  Result Value Ref Range Status   SARS Coronavirus 2 by RT PCR NEGATIVE NEGATIVE Final    Comment: (NOTE) SARS-CoV-2 target nucleic acids are NOT DETECTED.  The SARS-CoV-2 RNA is generally detectable in upper respiratory specimens during the acute phase of infection. The lowest concentration of SARS-CoV-2 viral copies this assay can detect is 138 copies/mL. A negative result does not preclude SARS-Cov-2 infection and should not be used as the sole basis for treatment or other patient management decisions. A negative result may occur with  improper specimen  collection/handling, submission of specimen other than nasopharyngeal swab, presence of viral mutation(s) within the areas targeted by this assay, and inadequate number of viral copies(<138 copies/mL). A negative result must be combined with clinical observations, patient history, and epidemiological information. The expected result is Negative.  Fact Sheet for Patients:  EntrepreneurPulse.com.au  Fact Sheet for Healthcare Providers:  IncredibleEmployment.be  This test is no t yet approved or cleared by the [place] FDA and  has been authorized for detection and/or diagnosis of SARS-CoV-2 by FDA under an Emergency Use Authorization (EUA). This EUA will remain  in effect (meaning this test can be used) for the duration of the COVID-19 declaration under Section 564(b)(1) of the Act, 21 U.S.C.section 360bbb-3(b)(1), unless the authorization is terminated  or revoked sooner.       Influenza A by PCR NEGATIVE NEGATIVE Final   Influenza B by PCR NEGATIVE NEGATIVE Final    Comment: (NOTE) The Xpert Xpress SARS-CoV-2/FLU/RSV plus assay is intended as an aid in the diagnosis of influenza from Nasopharyngeal swab specimens and should not be used as a sole basis for treatment. Nasal washings and aspirates are unacceptable for Xpert Xpress SARS-CoV-2/FLU/RSV testing.  Fact Sheet for Patients: EntrepreneurPulse.com.au  Fact Sheet for Healthcare Providers: IncredibleEmployment.be  This test is not yet approved or cleared by the [place] FDA and has been authorized for detection and/or diagnosis of SARS-CoV-2 by FDA under an  Emergency Use Authorization (EUA). This EUA will remain in effect (meaning this test can be used) for the duration of the COVID-19 declaration under Section 564(b)(1) of the Act, 21 U.S.C. section 360bbb-3(b)(1), unless the authorization is terminated or revoked.  Performed at [facility], Standard [address] [place], [place] [address]           Radiology Studies: No results found.      Scheduled Meds:  aspirin  81 mg Oral Daily   atorvastatin  40 mg Oral Daily   calcitRIOL  0.25 mcg Oral Daily   heparin  5,000 Units Subcutaneous BID   insulin aspart  0-9 Units Subcutaneous TID WC   isosorbide-hydrALAZINE  1 tablet Oral TID   lactulose  10 g Oral Daily   sevelamer carbonate  800 mg Oral TID WC   torsemide  40 mg Oral Daily   Continuous Infusions:   LOS: 4 days        Hosie Poisson, MD [facility]   To contact the attending provider between 7A-7P or the covering provider during after hours 7P-7A, please log into the web site [email or web address] and access using universal [place] password for that web site. If you do not have the password, please call the hospital operator.  05/15/2021, 11:06 AM

## 2021-05-15 NOTE — Progress Notes (Addendum)
[facility] Progress Note   65 y.o. female with PMHx of CAD s/p CABG in [id number], combined systolic and diastolic heart failure (EF 20-25%), type II diabetes mellitus, hypertension, CKD IV (baseline sCr 2.9-3.2) mult admissions for acute on chronic systolic HF.  She was just in the hospital and responded nicely to diuretics leaving  at 111.8 lbs. When she left the hospital on 04/30/2021 her Cr was 3.6. She's presenting with abnormal labs and was noted to have a Cr of 4.61 with hyperkalemia and a  CXR showing b/l pleural effusions. She is on torsemide (40/20 mg am/pm)  and metolazone (2.'5mg'$  on Mon and Fri) but was told by her PCP to discontinue the diuretics today and come to the ED for evaluation.    Assessment/ Plan:   AKI on CKD IV: Baseline sCr was in the 2.9-3.1 range recently and she was d/c w/ Cr of 3.6 a week ago; h/o cardiorenal syndrome in setting of CKD secondary to hypertension and DM. Renal US 7/13 without obstruction consistent with chronic renal disease. Subnephrotic proteinuria secondary to diabetes mellitus; RAAS blockade/SGLT2- use not possible with current renal function. Has had recent vein mapping; because of her persistent worsening uremia and recurrent hospitalizations we decided to try dialysis after discussion with the patient and her husband.  I'm not convinced she would tolerate dialysis with her cardiac status. -Can continue torsemide at current dose of 40 mg daily -Switch torsemide to 40 mg daily -Consult IR for tunneled dialysis catheter -Consider VVS consult once the patient is proven she can tolerate dialysis -Plan for HD tomorrow -Appreciate help from palliative care; if she does not tolerate dialysis we may have no more options for her -EDW felt to be around 49-50kg; today was below, ctm weights   Acute on chronic combined systolic and diastolic heart failure: suspected to be mixed ischemic and non-ischemic cardiomyopathy complicated by medication  nonadherence in the past. Metabolic alkalosis: Chronic, not a good candidate for diamox due to compromised renal function. No need for urgent RRT at this time. Will continue to trend. Macrocytic anemia w/hx of normocytic anemia: Iron replete. Vitamin B12 and folate wnl. Will likely need to continue Aranesp as outpatient. Type II DM: insulin dosing per primary team  Hyperphosphatemia: start sevelamer w/ meals  Subjective:   The patient's weight continues to be about the same at 50 kg.  Creatinine rose again today to 4.3.  Some signs of uremia with nausea and fatigue.  Concern from heart failure team regarding low cardiac output.  No options for advanced therapies given her clinical status.   discussed the patient's current clinical state with her.  I am concerned that she is not going to tolerate dialysis but she would like to try.  I discussed with her husband as well who is having a hard time understanding how all this has come to be.   Objective:   BP 109/60 (BP Location: Right Arm)   Pulse 79   Temp 97.6 F (36.4 C) (Oral)   Resp 16   Ht '5\' 5"'$  (1.651 m)   Wt 50.1 kg   SpO2 100%   BMI 18.37 kg/m   Intake/Output Summary (Last 24 hours) at 05/15/2021 0748 Last data filed at 05/14/2021 1700 Gross per 24 hour  Intake 720 ml  Output 500 ml  Net 220 ml   Weight change: 2.041 kg  Physical Exam: General:  Elderly appearing, thin, lying in bed Cor: Normal rate, no obvious murmur Lungs: no iwob, bilateral  chest rise Abdomen: soft, nontender, nondistended. Extremities: no cyanosis, clubbing, rash, tr edema Neuro: alert & orientedx3, cranial nerves grossly intact.  Imaging: No results found.  Labs: BMET Recent Labs  Lab 04/14/2021 1855 05/11/21 0147 05/11/21 1805 05/12/21 0527 05/12/21 1846 05/13/21 0614 05/14/21 0754 05/15/21 0152  NA  --  141 140 142 142 143 142 141  K  --  5.6* 5.7* 4.9 4.1 4.3 4.6 4.4  CL  --  88* 90* 88* 90* 90* 88* 87*  CO2  --  43* 39* 44* 43* 44* 44*  43*  GLUCOSE  --  273* 139* 248* 101* 165* 204* 139*  BUN  --  108* 104* 97* 93* 89* 89* 93*  CREATININE 4.59* 4.65* 4.21* 4.13* 3.87* 3.94* 4.02* 4.26*  CALCIUM  --  9.6 9.4 9.2 9.4 9.2 9.1 9.0  PHOS 5.8*  --   --   --   --   --  6.6* 7.0*   CBC Recent Labs  Lab 05/08/21 1307 04/15/2021 1557 04/21/2021 1855 05/11/21 0147  WBC 5.2 5.1 5.1 5.9  NEUTROABS 4.7 4.5  --  5.2  HGB 8.3* 9.1* 8.7* 9.1*  HCT 25.4* 30.5* 29.6* 30.1*  MCV 101.3* 110.1* 110.0* 108.7*  PLT 159.0 168 148* 148*    Medications:     aspirin  81 mg Oral Daily   atorvastatin  40 mg Oral Daily   calcitRIOL  0.25 mcg Oral Daily   heparin  5,000 Units Subcutaneous BID   insulin aspart  0-9 Units Subcutaneous TID WC   isosorbide-hydrALAZINE  1 tablet Oral TID   lactulose  10 g Oral Daily   torsemide  40 mg Oral Daily     Reesa Chew  05/15/2021, 7:48 AM

## 2021-05-16 ENCOUNTER — Inpatient Hospital Stay (HOSPITAL_COMMUNITY): Payer: BC Managed Care – PPO

## 2021-05-16 DIAGNOSIS — N179 Acute kidney failure, unspecified: Secondary | ICD-10-CM | POA: Diagnosis not present

## 2021-05-16 DIAGNOSIS — E875 Hyperkalemia: Secondary | ICD-10-CM | POA: Diagnosis not present

## 2021-05-16 HISTORY — PX: IR FLUORO GUIDE CV LINE RIGHT: IMG2283

## 2021-05-16 HISTORY — PX: IR US GUIDE VASC ACCESS RIGHT: IMG2390

## 2021-05-16 LAB — CBC
HCT: 25.8 % — ABNORMAL LOW (ref 36.0–46.0)
Hemoglobin: 7.9 g/dL — ABNORMAL LOW (ref 12.0–15.0)
MCH: 32.8 pg (ref 26.0–34.0)
MCHC: 30.6 g/dL (ref 30.0–36.0)
MCV: 107.1 fL — ABNORMAL HIGH (ref 80.0–100.0)
Platelets: 65 10*3/uL — ABNORMAL LOW (ref 150–400)
RBC: 2.41 MIL/uL — ABNORMAL LOW (ref 3.87–5.11)
RDW: 14.4 % (ref 11.5–15.5)
WBC: 4.3 10*3/uL (ref 4.0–10.5)
nRBC: 0 % (ref 0.0–0.2)

## 2021-05-16 LAB — RENAL FUNCTION PANEL
Albumin: 3.3 g/dL — ABNORMAL LOW (ref 3.5–5.0)
Anion gap: 13 (ref 5–15)
BUN: 94 mg/dL — ABNORMAL HIGH (ref 8–23)
CO2: 39 mmol/L — ABNORMAL HIGH (ref 22–32)
Calcium: 8.9 mg/dL (ref 8.9–10.3)
Chloride: 86 mmol/L — ABNORMAL LOW (ref 98–111)
Creatinine, Ser: 4.55 mg/dL — ABNORMAL HIGH (ref 0.44–1.00)
GFR, Estimated: 10 mL/min — ABNORMAL LOW (ref >60)
Glucose, Bld: 158 mg/dL — ABNORMAL HIGH (ref 70–99)
Phosphorus: 5.2 mg/dL — ABNORMAL HIGH (ref 2.5–4.6)
Potassium: 4.1 mmol/L (ref 3.5–5.1)
Sodium: 138 mmol/L (ref 135–145)

## 2021-05-16 LAB — GLUCOSE, CAPILLARY
Glucose-Capillary: 177 mg/dL — ABNORMAL HIGH (ref 70–99)
Glucose-Capillary: 133 mg/dL — ABNORMAL HIGH (ref 70–99)
Glucose-Capillary: 132 mg/dL — ABNORMAL HIGH (ref 70–99)
Glucose-Capillary: 165 mg/dL — ABNORMAL HIGH (ref 70–99)

## 2021-05-16 LAB — HEPATITIS B CORE ANTIBODY, TOTAL: Hep B Core Total Ab: NONREACTIVE

## 2021-05-16 LAB — HEPATITIS B SURFACE ANTIGEN: Hepatitis B Surface Ag: NONREACTIVE

## 2021-05-16 MED ORDER — LIDOCAINE-EPINEPHRINE 2 %-1:100000 IJ SOLN
INTRAMUSCULAR | Status: AC | PRN
  Administered 2021-05-16: 10 mL

## 2021-05-16 MED ORDER — LIDOCAINE-EPINEPHRINE 1 %-1:100000 IJ SOLN
INTRAMUSCULAR | Status: AC
  Filled 2021-05-16: qty 1

## 2021-05-16 MED ORDER — PENTAFLUOROPROP-TETRAFLUOROETH EX AERO: 1.0000 "application " | INHALATION_SPRAY | CUTANEOUS | Status: DC | PRN

## 2021-05-16 MED ORDER — LIDOCAINE-PRILOCAINE 2.5-2.5 % EX CREA
1.0000 "application " | TOPICAL_CREAM | CUTANEOUS | Status: DC | PRN
  Filled 2021-05-16: qty 5

## 2021-05-16 MED ORDER — HEPARIN SODIUM (PORCINE) 1000 UNIT/ML DIALYSIS: 1000.0000 [IU] | INTRAMUSCULAR | Status: DC | PRN

## 2021-05-16 MED ORDER — MIDAZOLAM HCL 2 MG/2ML IJ SOLN
INTRAMUSCULAR | Status: AC | PRN
  Administered 2021-05-16: 0.5 mg via INTRAVENOUS

## 2021-05-16 MED ORDER — RENA-VITE PO TABS
1.0000 | ORAL_TABLET | Freq: Every day | ORAL | Status: DC
  Administered 2021-05-16 – 2021-05-20 (×5): 1 via ORAL
  Filled 2021-05-16 (×5): qty 1

## 2021-05-16 MED ORDER — CEFAZOLIN SODIUM-DEXTROSE 2-4 GM/100ML-% IV SOLN
INTRAVENOUS | Status: AC
  Administered 2021-05-16: 2000 mg
  Filled 2021-05-16: qty 100

## 2021-05-16 MED ORDER — FENTANYL CITRATE (PF) 100 MCG/2ML IJ SOLN
INTRAMUSCULAR | Status: AC
  Filled 2021-05-16: qty 2

## 2021-05-16 MED ORDER — SODIUM CHLORIDE 0.9 % IV SOLN: 100.0000 mL | INTRAVENOUS | Status: DC | PRN

## 2021-05-16 MED ORDER — HEPARIN SODIUM (PORCINE) 1000 UNIT/ML IJ SOLN
INTRAMUSCULAR | Status: AC
  Filled 2021-05-16: qty 1

## 2021-05-16 MED ORDER — MIDAZOLAM HCL 2 MG/2ML IJ SOLN
INTRAMUSCULAR | Status: AC
  Filled 2021-05-16: qty 2

## 2021-05-16 MED ORDER — LIDOCAINE HCL (PF) 1 % IJ SOLN: 5.0000 mL | INTRAMUSCULAR | Status: DC | PRN

## 2021-05-16 MED ORDER — ALTEPLASE 2 MG IJ SOLR
2.0000 mg | Freq: Once | INTRAMUSCULAR | Status: DC | PRN
  Filled 2021-05-16: qty 2

## 2021-05-16 MED ORDER — NEPRO/CARBSTEADY PO LIQD
237.0000 mL | Freq: Three times a day (TID) | ORAL | Status: DC
  Administered 2021-05-16 – 2021-05-21 (×2): 237 mL via ORAL

## 2021-05-16 MED ORDER — ANTICOAGULANT SODIUM CITRATE 4% (200MG/5ML) IV SOLN
5.0000 mL | Status: DC | PRN
  Filled 2021-05-16: qty 5

## 2021-05-16 NOTE — Progress Notes (Signed)
Initial Nutrition Assessment  DOCUMENTATION CODES:   Not applicable  INTERVENTION:   Nepro Shake po TID, each supplement provides 425 kcal and 19 grams protein. Renal multivitamin PO once daily.  NUTRITION DIAGNOSIS:   Increased nutrient needs related to chronic illness (CHF, CKD) as evidenced by estimated needs.  GOAL:   Patient will meet greater than or equal to 90% of their needs  MONITOR:   PO intake, Supplement acceptance, Skin, Labs  REASON FOR ASSESSMENT:   Consult Poor PO  ASSESSMENT:   65 yo female admitted with hyperkalemia. PMH includes CHF, DM, CAD, HTN, HLD, CKD.  Unable to speak with patient at this time. RN reports that patient has been eating poorly and has been very weak. Meal intakes recorded at 10-50% since admission. Average intake 32% of meals.   S/P TDC placement earlier today. Plans for HD today and tomorrow. Further treatments to be decided based on patient's tolerance of HD.   Patient with 23% weight loss over the past 4 months. Weight change likely at least partially related to fluid status with hx of CHF and CKD.   Labs reviewed. Phos 5.2 CBG: 177-133  Medications reviewed and include calcitriol, novolog, lactulose, Renvela.  NUTRITION - FOCUSED PHYSICAL EXAM:  Unable to complete  Diet Order:   Diet Order             Diet renal/carb modified with fluid restriction Diet-HS Snack? Nothing; Fluid restriction: 1200 mL Fluid; Room service appropriate? Yes; Fluid consistency: Thin  Diet effective now                   EDUCATION NEEDS:   Not appropriate for education at this time  Skin:  Skin Assessment: Skin Integrity Issues: Skin Integrity Issues:: Stage I Stage I: buttocks  Last BM:  8/2 type 1  Height:   Ht Readings from Last 1 Encounters:  05/02/2021 '5\' 5"'$  (1.651 m)    Weight:   Wt Readings from Last 1 Encounters:  05/16/21 50.2 kg    Ideal Body Weight:  56.8 kg  BMI:  Body mass index is 18.42  kg/m.  Estimated Nutritional Needs:   Kcal:  1600-1800  Protein:  70-80 gm  Fluid:  1 L + UOP    Lucas Mallow, RD, LDN, CNSC Please refer to Amion for contact information.

## 2021-05-16 NOTE — Progress Notes (Signed)
[facility] Progress Note   65 y.o. female with PMHx of CAD s/p CABG in [id number], combined systolic and diastolic heart failure (EF 20-25%), type II diabetes mellitus, hypertension, CKD IV (baseline sCr 2.9-3.2) mult admissions for acute on chronic systolic HF.  She was just in the hospital and responded nicely to diuretics leaving  at 111.8 lbs. When she left the hospital on 04/30/2021 her Cr was 3.6. She's presenting with abnormal labs and was noted to have a Cr of 4.61 with hyperkalemia and a  CXR showing b/l pleural effusions. She is on torsemide (40/20 mg am/pm)  and metolazone (2.'5mg'$  on Mon and Fri) but was told by her PCP to discontinue the diuretics today and come to the ED for evaluation.    Assessment/ Plan:   AKI on CKD IV: Baseline sCr was in the 2.9-3.1 range recently and she was d/c w/ Cr of 3.6 a week ago; h/o cardiorenal syndrome in setting of CKD secondary to hypertension and DM. Renal US 7/13 without obstruction consistent with chronic renal disease. Subnephrotic proteinuria secondary to diabetes mellitus; RAAS blockade/SGLT2- use not possible with current renal function. Has had recent vein mapping; because of her persistent worsening uremia and recurrent hospitalizations we decided to try dialysis after discussion with the patient and her husband.  I'm not convinced she would tolerate dialysis with her cardiac status. -Can continue torsemide at current dose of 40 mg daily -IR to place [facility] today, appreciate help -Consider VVS consult once the patient is proven she can tolerate dialysis -Plan for HD today as well as tomorrow as long as she tolerates the session -Appreciate help from palliative care; if she does not tolerate dialysis we may have no more options for her -EDW felt to be around 49-50kg; today was below, ctm weights   Acute on chronic combined systolic and diastolic heart failure: suspected to be mixed ischemic and non-ischemic cardiomyopathy complicated by  medication nonadherence in the past. Metabolic alkalosis: Chronic, not a good candidate for diamox due to compromised renal function. No need for urgent RRT at this time. Will continue to trend. Macrocytic anemia w/hx of normocytic anemia: Iron replete.  Hemoglobin fluctuating.  Vitamin B12 and folate wnl.  Continue Aranesp as ordered and consider increasing dose Type II DM: insulin dosing per primary team  Hyperphosphatemia: Continue sevelamer w/ meals  Subjective:   Patient states she feels about the same today.  Creatinine continues to be little bit higher.  Planning for dialysis today   Objective:   BP 135/70 (BP Location: Right Arm)   Pulse 89   Temp 98.8 F (37.1 C) (Oral)   Resp 19   Ht '5\' 5"'$  (1.651 m)   Wt 50.2 kg   SpO2 100%   BMI 18.42 kg/m   Intake/Output Summary (Last 24 hours) at 05/16/2021 1015 Last data filed at 05/16/2021 0100 Gross per 24 hour  Intake 212.17 ml  Output 475 ml  Net -262.83 ml   Weight change: 0.123 kg  Physical Exam: General:  Elderly appearing, thin, lying in bed Cor: Normal rate, no obvious murmur Lungs: no iwob, bilateral chest rise Abdomen: soft, nontender, nondistended. Extremities: no cyanosis, clubbing, rash, no edema Neuro: alert & orientedx3, cranial nerves grossly intact.  Imaging: No results found.  Labs: BMET Recent [employer] 05/09/2021 [id number] 05/11/21 0147 05/11/21 1805 05/12/21 [id number] 05/12/21 1846 05/13/21 [id number] 05/14/21 0754 05/15/21 0152 05/16/21 0317  NA  --    < > 140 142 142 143 142 141  138  K  --    < > 5.7* 4.9 4.1 4.3 4.6 4.4 4.1  CL  --    < > 90* 88* 90* 90* 88* 87* 86*  CO2  --    < > 39* 44* 43* 44* 44* 43* 39*  GLUCOSE  --    < > 139* 248* 101* 165* 204* 139* 158*  BUN  --    < > 104* 97* 93* 89* 89* 93* 94*  CREATININE 4.59*   < > 4.21* 4.13* 3.87* 3.94* 4.02* 4.26* 4.55*  CALCIUM  --    < > 9.4 9.2 9.4 9.2 9.1 9.0 8.9  PHOS 5.8*  --   --   --   --   --  6.6* 7.0* 5.2*   < > = values in this interval not  displayed.   CBC Recent Labs  Lab 04/16/2021 1557 04/24/2021 1855 05/11/21 0147 05/16/21 0317  WBC 5.1 5.1 5.9 4.3  NEUTROABS 4.5  --  5.2  --   HGB 9.1* 8.7* 9.1* 7.9*  HCT 30.5* 29.6* 30.1* 25.8*  MCV 110.1* 110.0* 108.7* 107.1*  PLT 168 148* 148* 65*    Medications:     aspirin  81 mg Oral Daily   atorvastatin  40 mg Oral Daily   calcitRIOL  0.25 mcg Oral Daily   Chlorhexidine Gluconate Cloth  6 each Topical Q0600   heparin  5,000 Units Subcutaneous BID   heparin sodium (porcine)       insulin aspart  0-9 Units Subcutaneous TID WC   isosorbide-hydrALAZINE  1 tablet Oral TID   lactulose  10 g Oral Daily   lidocaine-EPINEPHrine       midazolam       sevelamer carbonate  800 mg Oral TID WC   torsemide  40 mg Oral Daily     Reesa Chew  05/16/2021, 10:15 AM

## 2021-05-16 NOTE — Procedures (Signed)
Interventional Radiology Procedure Note  Procedure: Renal failure  Indication: Tunneled HD catheter  Findings: Please refer to procedural dictation for full description.  Complications: None  EBL: < 10 mL  Miachel Roux, MD [phone]

## 2021-05-16 NOTE — TOC Progression Note (Addendum)
Transition of Care (TOC) - Progression Note  Heart Failure   Patient Details  Name: Beverly Martin MRN: [id number] Date of Birth: Apr 03, 1956  Transition of Care [facility]) CM/SW [place], [place] Phone Number: 05/16/2021, 11:44 AM  Clinical Narrative:    CSW attempted to call the patient's employer again with Mrs. Shoe's permission, [phone] [name] however again nobody answered the phone and CSW left a voicemail for her to return the call. CSW followed up with [name] at bedside today to let her know that the CSW has outreached her employer regarding the short term disability and that the CSW has not heard anything back from her employer although several attempts have been made and [name] reported understanding and to please continue to outreach.  CSW will continue to follow throughout discharge.     Barriers to Discharge: Continued Medical Work up  Expected Discharge Plan and Services   In-house Referral: Clinical Social Work     Living arrangements for the past 2 months: Single Family Home                                       Social Determinants of Health (SDOH) Interventions Food Insecurity Interventions: Intervention Not Indicated Financial Strain Interventions: Intervention Not Indicated Housing Interventions: Intervention Not Indicated Transportation Interventions: Intervention Not Indicated  Readmission Risk Interventions Readmission Risk Prevention Plan 04/30/2021 03/15/2021 03/15/2021  Transportation Screening Complete Complete Complete  PCP or Specialist Appt within 5-7 Days - Complete Complete  Home Care Screening - Complete Complete  Medication Review (RN CM) - Complete Complete  Medication Review ([place]) Complete - -  PCP or Specialist appointment within 3-5 days of discharge Complete - -  [place] or Home Care Consult Complete - -  SW Recovery Care/Counseling Consult Complete - -  Palliative Care Screening Not  Applicable - -  [place] Not Applicable - -  Some recent data might be hidden   Khandi Kernes, MSW, [place] Heart Failure Social Worker

## 2021-05-16 NOTE — Consult Note (Signed)
Consultation Note Date: 05/16/2021   Patient Name: Beverly Martin  DOB: Dec 08, 1955  MRN: [id number]  Age / Sex: 65 y.o., female  PCP: Billie Ruddy, MD Referring Physician: Hosie Poisson, MD  Reason for Consultation: Establishing goals of care and Psychosocial/spiritual support  HPI/Patient Profile: 65 y.o. female  admitted on 04/20/2021 with a past  medical history significant for systolic heart failure, diabetes, coronary disease status post CABG, hypertension, hyperlipidemia CKD 4/5 presents to the ED from PCP office shortly after returning home for hyperkalemia and elevated creatinine.  Heart failure and nephrology consulted.     Patient had echo in March showing EF of 20 to 25%.   Echo from 04/25/2021 with EF of 30 to 35%.   Today is day 4 of this hospitalization, worsening renal function.  Patient has made decisions for procedure for tunneled dialysis cath and trial of hemodialysis here in the hospital.  Medical team concerned for patient's ability to tolerate hemodialysis.    Patient and family face treatment option decisions, advanced directive decisions and anticipatory care needs.   Clinical Assessment and Goals of Care:   This NP Wadie Lessen reviewed medical records, received report from team, assessed the patient and then meet at the patient's bedside along with her husband/[name] to discuss diagnosis, prognosis, GOC, EOL wishes disposition and options.   Concept of Palliative Care was introduced as specialized medical care for people and their families living with serious illness.  If focuses on providing relief from the symptoms and stress of a serious illness.  The goal is to improve quality of life for both the patient and the family.  Values and goals of care important to patient and family were attempted to be elicited.  Created space and opportunity for patient  and family to explore  thoughts and feelings regarding current medical situation.  Both patient and her husband verbalized an understanding of the seriousness of her current medical situation.  Patient herself seems to be at peace understanding the likely limitations of medical interventions to prolong quality life however her husband verbalizes anxiety here as he faces/anticipates possible loss of his wife.  They have been together for 30 some years and are "everything to each other."  Emotional support offered  Suggestion of spiritual care consult and both patient and husband are open to this.       A  discussion was had today regarding advanced directives.  Concepts specific to code status, artifical feeding and hydration, continued IV antibiotics and rehospitalization was had.  The difference between a aggressive medical intervention path  and a palliative comfort care path for this patient at this time was had.        Natural trajectory and expectations at EOL were discussed.  Questions and concerns addressed.  Patient  encouraged to call with questions or concerns.     PMT will continue to support holistically.          No documented healthcare power of attorney.  Patient's husband is next of kin and  will make medical decisions for patient in the event that she lacks medical decision capacity.      SUMMARY OF RECOMMENDATIONS    Code Status/Advance Care Planning: Full code Educated  patient/family to consider DNR/DNI status understanding evidenced based poor outcomes in similar hospitalized patient, as the cause of arrest is likely associated with advanced chronic illness rather than an easily reversible acute cardio-pulmonary event.    Symptom Management:  - per attending  Palliative Prophylaxis:  Bowel Regimen, Delirium Protocol, Frequent Pain Assessment, and Oral Care  Additional Recommendations (Limitations, Scope, Preferences): Full Scope Treatment  Psycho-social/Spiritual:  Desire for  further Chaplaincy support:yes Additional Recommendations: introduced hospice, husband was uncomfortable with discussion, however he did listen reluctantly   Prognosis:  Unable to determine-will depend on patient's desired for ongoing life prolonging measures and associated outcomes. Will need ingoing f/u, she is high risk for decompensation  Discharge Planning: To Be Determined      Primary Diagnoses: Present on Admission:  CKD (chronic kidney disease) stage 4, GFR 15-29 ml/min (HCC)  Hyperkalemia  Type 2 diabetes mellitus with hyperlipidemia (HCC)  Chronic systolic heart failure (HCC)  Macrocytic anemia  Elevated troponin  CAD (coronary artery disease)  Essential hypertension  AKI (acute kidney injury) ([place])   I have reviewed the medical record, interviewed the patient and family, and examined the patient. The following aspects are pertinent.  Past Medical History:  Diagnosis Date   CAD (coronary artery disease)    Chronic kidney disease (CKD) stage G4/A1, severely decreased glomerular filtration rate (GFR) between 15-29 mL/min/1.73 square meter and albuminuria creatinine ratio less than 30 mg/g (HCC)    Combined systolic and diastolic ACC/AHA stage C congestive heart failure (HCC)    Diabetes mellitus without complication ([place])    Essential hypertension    Hyperlipidemia    Social History   Socioeconomic History   Marital status: Married    Spouse name: Not on file   Number of children: Not on file   Years of education: Not on file   Highest education level: Not on file  Occupational History   Not on file  Tobacco Use   Smoking status: Never   Smokeless tobacco: Never  Vaping Use   Vaping Use: Never used  Substance and Sexual Activity   Alcohol use: No   Drug use: No   Sexual activity: Not Currently  Other Topics Concern   Not on file  Social History Narrative   Not on file   Social Determinants of Health   Financial Resource Strain: Low Risk     Difficulty of Paying Living Expenses: Not hard at all  Food Insecurity: No Food Insecurity   Worried About Charity fundraiser in the Last Year: Never true   Ran Out of Food in the Last Year: Never true  Transportation Needs: No Transportation Needs   Lack of Transportation (Medical): No   Lack of Transportation (Non-Medical): No  Physical Activity: Insufficiently Active   Days of Exercise per Week: 2 days   Minutes of Exercise per Session: 20 min  Stress: Not on file  Social Connections: Not on file   Family History  Problem Relation Age of Onset   Diabetes Mellitus II Mother    Esophageal cancer Father    Scheduled Meds:  aspirin  81 mg Oral Daily   atorvastatin  40 mg Oral Daily   calcitRIOL  0.25 mcg Oral Daily   Chlorhexidine Gluconate Cloth  6 each Topical Q0600   fentaNYL  heparin  5,000 Units Subcutaneous BID   heparin sodium (porcine)       insulin aspart  0-9 Units Subcutaneous TID WC   isosorbide-hydrALAZINE  1 tablet Oral TID   lactulose  10 g Oral Daily   lidocaine-EPINEPHrine       midazolam       sevelamer carbonate  800 mg Oral TID WC   torsemide  40 mg Oral Daily   Continuous Infusions: PRN Meds:.acetaminophen **OR** acetaminophen, lidocaine-EPINEPHrine, midazolam, ondansetron (ZOFRAN) IV Medications Prior to Admission:  Prior to Admission medications   Medication Sig Start Date End Date Taking? Authorizing Provider  aspirin EC 81 MG EC tablet Take 1 tablet (81 mg total) by mouth daily. Swallow whole. 06/19/20 06/14/21 Yes Hall, Carole N, DO  atorvastatin (LIPITOR) 40 MG tablet Take 1 tablet (40 mg total) by mouth daily. 04/09/21  Yes Billie Ruddy, MD  calcitRIOL (ROCALTROL) 0.25 MCG capsule TAKE 1 CAPSULE BY MOUTH EVERY DAY Patient taking differently: Take 0.25 mcg by mouth daily. 04/03/21  Yes Billie Ruddy, MD  isosorbide-hydrALAZINE (BIDIL) 20-37.5 MG tablet Take 1 tablet by mouth 3 (three) times daily. 04/30/21 05/30/21 Yes Arrien, Jimmy Picket,  MD  metolazone (ZAROXOLYN) 2.5 MG tablet Take 1 tablet (2.5 mg total) by mouth 2 (two) times a week. Take one tablet on Monday and one tablet on Friday 05/02/21  Yes Arrien, Jimmy Picket, MD  potassium chloride SA (KLOR-CON) 20 MEQ tablet Take 2 tablets (40 mEq total) by mouth 2 (two) times a week. Take 2 tablets on Monday and 2 tablets on Friday (with motolazone) 05/02/21  Yes Arrien, Jimmy Picket, MD  torsemide (DEMADEX) 20 MG tablet Take 1 tablet (20 mg total) by mouth every evening. Patient taking differently: Take 40 mg by mouth daily. 04/30/21  Yes Arrien, Jimmy Picket, MD  Accu-Chek Softclix Lancets lancets Use as instructed Patient not taking: Reported on 04/15/2021 05/08/21   Billie Ruddy, MD  torsemide 40 MG TABS Take 40 mg by mouth every morning. Patient not taking: Reported on 04/14/2021 05/01/21   Arrien, Jimmy Picket, MD   No Known Allergies Review of Systems  Neurological:  Positive for weakness.   Physical Exam Cardiovascular:     Rate and Rhythm: Normal rate.  Skin:    General: Skin is warm and dry.  Neurological:     Mental Status: She is alert and oriented to person, place, and time.    Vital Signs: BP 133/69 (BP Location: Right Arm)   Pulse 92   Temp 97.9 F (36.6 C) (Oral)   Resp 16   Ht '5\' 5"'$  (1.651 m)   Wt 50.2 kg   SpO2 100%   BMI 18.42 kg/m  Pain Scale: Faces   Pain Score: Asleep   SpO2: SpO2: 100 % O2 Device:SpO2: 100 % O2 Flow Rate: .O2 Flow Rate (L/min): 2 L/min  IO: Intake/output summary:  Intake/Output Summary (Last 24 hours) at 05/16/2021 0941 Last data filed at 05/16/2021 0100 Gross per 24 hour  Intake 212.17 ml  Output 475 ml  Net -262.83 ml    LBM: Last BM Date: 05/13/21 Baseline Weight: Weight: 49.9 kg Most recent weight: Weight: 50.2 kg     Palliative Assessment/Data: 30 % at best   Discussed with Dr Karleen Hampshire  Time In:  1500 Time Out: 1610 Time Total: 70 minutes Greater than 50%  of this time was spent counseling and  coordinating care related to the above assessment and plan.  Signed by: Wadie Lessen,  NP   Please contact Palliative Medicine Team phone at [phone] for questions and concerns.  For individual provider: See Shea Evans

## 2021-05-16 NOTE — Progress Notes (Addendum)
Advanced Heart Failure Team Rounding Note   Primary Physician:Banks, Larene Beach Primary Cardiologist:  Nahser, Rito Ehrlich, Craigory Toste AHF  Reason for Consultation: Chronic Combined Systolic and Diastolic CHF  HPI:    Cr 123456 today, BUN 94, wt stable.  Feeling okay today, still weak.  Denies chest pain, dyspnea, orthopnea, PND. NPO for [facility] placement this am.  Objective:    Vital Signs:   Temp:  [97.9 F (36.6 C)-98.5 F (36.9 C)] 97.9 F (36.6 C) (08/04 0500) Pulse Rate:  [88-89] 89 (08/04 0500) Resp:  [16-25] 22 (08/04 0500) BP: (122-132)/(65-71) 122/65 (08/04 0500) SpO2:  [98 %-100 %] 100 % (08/04 0500) Weight:  [50.2 kg] 50.2 kg (08/04 0500) Last BM Date: 05/13/21  Weight change: Filed Weights   05/14/21 0415 05/15/21 0400 05/16/21 0500  Weight: 48 kg 50.1 kg 50.2 kg    Intake/Output:   Intake/Output Summary (Last 24 hours) at 05/16/2021 0744 Last data filed at 05/16/2021 0100 Gross per 24 hour  Intake 452.17 ml  Output 475 ml  Net -22.83 ml      Physical Exam    General:  cachectic, No resp difficulty HEENT: normal Neck: supple. JVP 6-7  . Carotids 2+ bilat; no bruits. No lymphadenopathy or thyromegaly appreciated. Cor: PMI nondisplaced. Regular rate & rhythm. No rubs, gallops or murmurs. Lungs: bibasilar fine rales Abdomen: soft, nontender, nondistended. No hepatosplenomegaly. No bruits or masses. Good bowel sounds. Extremities: no cyanosis, clubbing, rash, edema Neuro: alert & orientedx3, cranial nerves grossly intact. moves all 4 extremities w/o difficulty. Affect pleasant   Telemetry   NSR rate 80's Personally reviewed   Labs   Basic Metabolic Panel: Recent Labs  Lab 04/17/2021 1855 05/11/21 0147 05/11/21 1805 05/12/21 1846 05/13/21 [id number] 05/14/21 0754 05/15/21 0152 05/16/21 0317  NA  --  141   < > 142 143 142 141 138  K  --  5.6*   < > 4.1 4.3 4.6 4.4 4.1  CL  --  88*   < > 90* 90* 88* 87* 86*  CO2  --  43*   < > 43* 44* 44*  43* 39*  GLUCOSE  --  273*   < > 101* 165* 204* 139* 158*  BUN  --  108*   < > 93* 89* 89* 93* 94*  CREATININE 4.59* 4.65*   < > 3.87* 3.94* 4.02* 4.26* 4.55*  CALCIUM  --  9.6   < > 9.4 9.2 9.1 9.0 8.9  MG  --  2.8*  --   --   --   --   --   --   PHOS 5.8*  --   --   --   --  6.6* 7.0* 5.2*   < > = values in this interval not displayed.    Liver Function Tests: Recent Labs  Lab 05/09/21 0854 05/11/21 0147 05/14/21 0754 05/15/21 0152 05/16/21 0317  AST 11 13*  --   --   --   ALT 16 17  --   --   --   ALKPHOS 80 68  --   --   --   BILITOT 0.6 1.1  --   --   --   PROT 6.8 6.5  --   --   --   ALBUMIN 4.2 3.7 3.5 3.5 3.3*   No results for input(s): LIPASE, AMYLASE in the last 168 hours. No results for input(s): AMMONIA in the last 168 hours.  CBC: Recent Labs  Lab 05/02/2021  1557 05/01/2021 1855 05/11/21 0147 05/16/21 0317  WBC 5.1 5.1 5.9 4.3  NEUTROABS 4.5  --  5.2  --   HGB 9.1* 8.7* 9.1* 7.9*  HCT 30.5* 29.6* 30.1* 25.8*  MCV 110.1* 110.0* 108.7* 107.1*  PLT 168 148* 148* 65*    Cardiac Enzymes: No results for input(s): CKTOTAL, CKMB, CKMBINDEX, TROPONINI in the last 168 hours.  BNP: BNP (last 3 results) Recent Labs    04/24/21 1223 05/02/2021 1714 05/09/2021 1850  BNP >4,500.0* >4,500.0* >4,500.0*    ProBNP (last 3 results) Recent Labs    12/06/20 0927  PROBNP >5000.0*     CBG: Recent Labs  Lab 05/15/21 0729 05/15/21 1146 05/15/21 1637 05/15/21 2105 05/16/21 0734  GLUCAP 197* 153* 178* 133* 177*    Coagulation Studies: No results for input(s): LABPROT, INR in the last 72 hours.   Imaging   No results found.   Medications:     Current Medications:  aspirin  81 mg Oral Daily   atorvastatin  40 mg Oral Daily   calcitRIOL  0.25 mcg Oral Daily   Chlorhexidine Gluconate Cloth  6 each Topical Q0600   heparin  5,000 Units Subcutaneous BID   insulin aspart  0-9 Units Subcutaneous TID WC   isosorbide-hydrALAZINE  1 tablet Oral TID    lactulose  10 g Oral Daily   sevelamer carbonate  800 mg Oral TID WC   torsemide  40 mg Oral Daily    Infusions:    Assessment/Plan   1. Chronic Combined Systolic and Diastolic CHF: - Likely mixed ischemic and NICM, poorly controlled HTN, poor medication adherence may also have progressive CAD but unable to do coronary angio due to CKD 4. - Echo (12/2020): EF down from 45-50% in 2019 to 20-25%, RV moderately reduced - Echo (04/25/21): EF 30-35%, G2DD, moderately reduced RV function. - GDMT limited by renal function. - NYHA III symptoms, no LE edema, bibasilar rales but stable minimal JVD, REDS 36% on 8/1 - Diuretic regimen is torsemide 40 daily, metolazone once weekly but transitioning to HD for volume managment - Continue bidil 1 tab tid, may need to hold for dialysis - Not candidate for advanced HF therapies. Has refused Palliative services in past.  - She will begin a trial of dialysis     2. AKI on CKD Stage 4: - SCr baseline 3.6. Up to 4.6 on admit day back up to 4.55 today - After discussion with nephrology [facility] placement arranged and HD ordered for a trial of HD  3. CAD s/p CABG 2005 - No s/s angina. - Continue ASA/statin.   4. T2DM: - Hgb A1C 9.1 (6/22). - now on insulin - Not a candidate for SGLT2i (GFR and hx of urosepsis).   5. HTN: -continue bidil, may have to hold with HD   Length of Stay: 5  Katherine Roan, MD  05/16/2021, 7:44 AM  Advanced Heart Failure Team Pager [phone] (M-F; 7a - 4p)  Please contact [facility] for night-coverage after hours (4p -7a ) and weekends on [email or web address]   Patient seen and examined with the above-signed Advanced Practice Provider and/or Housestaff. I personally reviewed laboratory data, imaging studies and relevant notes. I independently examined the patient and formulated the important aspects of the plan. I have edited the note to reflect any of my changes or salient points. I have personally discussed the plan with the  patient and/or family.  Patient seen on am rounds prior to starting HD.   Has had  placement of TDC. Feels weak. Denies SOB. Poor appetite. Apprehensive about starting HD. SCr up to 4.55  General:  Weak appearing. No resp difficulty HEENT: normal Neck: supple. JVP 10 RIJ TDC  Carotids 2+ bilat; no bruits. No lymphadenopathy or thryomegaly appreciated. Cor: PMI nondisplaced. Regular rate & rhythm. No rubs, gallops or murmurs. Lungs: basilar crackles Abdomen: soft, nontender, mildly distended. No hepatosplenomegaly. No bruits or masses. Good bowel sounds. Extremities: no cyanosis, clubbing, rash, tr edema Neuro: alert & orientedx3, cranial nerves grossly intact. moves all 4 extremities w/o difficulty. Affect pleasant  She appears mildly volume overloaded and slightly uremic. Long talk with her about HD and what to expect. Chaplain in room as well. We discussed the possibility that it may be hard for her to tolerate HD but if she can tolerate it well may help keep her from being admitted to the hospital so frequently.   Appreciate Renal's assistance with her care.   Glori Bickers, MD  11:33 PM

## 2021-05-16 NOTE — Progress Notes (Signed)
Physical Therapy Treatment Patient Details Name: Beverly Martin MRN: [id number] DOB: September 20, 1956 Today's Date: 05/16/2021    History of Present Illness 65 y.o. female presents to [facility] ED on 04/26/2021 for SOB, chest tightness and LE edema. Pt admitted for management of CHF. PMH includes systolic heart failure, diabetes, coronary disease status post CABG, hypertension, hyperlipidemia CKD 4/5.    PT Comments    Pt tolerates treatment well, ambulating for limited community distances without need for rest breaks. Pt requires cues for utilization of rollator brakes to improve safety, but does demonstrate some retention within session. Pt will benefit from continued acute PT POC to improve activity tolerance and aide in restoring independence.   Follow Up Recommendations  Home health PT;Supervision - Intermittent     Equipment Recommendations  Other (comment) (4 wheeled walker with seat)    Recommendations for Other Services       Precautions / Restrictions Precautions Precautions: Fall Precaution Comments: on 1L at baseline per pt report Restrictions Weight Bearing Restrictions: No    Mobility  Bed Mobility                    Transfers Overall transfer level: Needs assistance Equipment used: 4-wheeled walker Transfers: Sit to/from Stand Sit to Stand: Supervision Stand pivot transfers: Supervision       General transfer comment: pt requires supervision and cues for use of Rollator brakes  Ambulation/Gait Ambulation/Gait assistance: Supervision Gait Distance (Feet): 350 Feet Assistive device: 4-wheeled walker Gait Pattern/deviations: Step-through pattern Gait velocity: functional Gait velocity interpretation: 1.31 - 2.62 ft/sec, indicative of limited community ambulator General Gait Details: pt with slowed step-through gait, cues for brake use. Pt bumps into 3 objects on right side, hitting brake of Rollator, able to self-correct   Stairs              Wheelchair Mobility    Modified Rankin (Stroke Patients Only)       Balance Overall balance assessment: Needs assistance Sitting-balance support: No upper extremity supported;Feet supported Sitting balance-Leahy Scale: Good     Standing balance support: No upper extremity supported Standing balance-Leahy Scale: Fair                              Cognition Arousal/Alertness: Awake/alert Behavior During Therapy: WFL for tasks assessed/performed Overall Cognitive Status: Within Functional Limits for tasks assessed                                        Exercises      General Comments General comments (skin integrity, edema, etc.): pt on 2L [place] upon arrival, pt desats with mobility when weaned to 1L [place], otherwise sats stable on 2L [place]      Pertinent Vitals/Pain Pain Assessment: No/denies pain    Home Living                      Prior Function            PT Goals (current goals can now be found in the care plan section) Acute Rehab PT Goals Patient Stated Goal: to walk without oxygen Progress towards PT goals: Progressing toward goals    Frequency    Min 3X/week      PT Plan Current plan remains appropriate    Co-evaluation  AM-PAC PT "6 Clicks" Mobility   Outcome Measure  Help needed turning from your back to your side while in a flat bed without using bedrails?: None Help needed moving from lying on your back to sitting on the side of a flat bed without using bedrails?: None Help needed moving to and from a bed to a chair (including a wheelchair)?: A Little Help needed standing up from a chair using your arms (e.g., wheelchair or bedside chair)?: A Little Help needed to walk in hospital room?: A Little Help needed climbing 3-5 steps with a railing? : A Little 6 Click Score: 20    End of Session Equipment Utilized During Treatment: Oxygen Activity Tolerance: Patient tolerated treatment  well Patient left: in chair;with call bell/phone within reach Nurse Communication: Mobility status PT Visit Diagnosis: Other abnormalities of gait and mobility (R26.89)     Time: [id number] PT Time Calculation (min) (ACUTE ONLY): 23 min  Charges:  $Gait Training: 8-22 mins $Therapeutic Activity: 8-22 mins                     Zenaida Niece, PT, DPT Acute Rehabilitation Pager: [phone]    Zenaida Niece 05/16/2021, 12:22 PM

## 2021-05-16 NOTE — Progress Notes (Signed)
This chaplain responded to PMT consult for spiritual care. PMT NP Belenda Cruise introduced the chaplain to the Pt.  The Pt. is in the bedside recliner nibbling at lunch.  The Pt. husband-Beverly Martin and two friends are visiting before the scheduled dialysis.   The chaplain listened reflectively and understands the Pt. is "covered with prayers" from her faith community.  The Pt. is hopeful Beverly Martin will find companionship from his sister as Beverly Martin may be experiencing anticipatory grief in the Pt. admission. The chaplain understands from the Pt., Beverly Martin is willing to learn new things involved in the Pt. care and the Pt. will continue to talk to Beverly Martin, too. The chaplain offered a spiritual care presence as needed.  The Pt. chose to share with Beverly Martin the MD's recent visit and hopefulness dialysis may improve the way the Pt. has been feeling.  The Pt. accepted the chaplain's added prayers to the Pt. praying community.

## 2021-05-16 NOTE — Progress Notes (Signed)
PROGRESS NOTE    Beverly Martin  [id number] DOB: 1956-04-17 DOA: 04/23/2021 PCP: Billie Ruddy, MD    Chief Complaint  Patient presents with   Abnormal Labs    R/T kidney function    Brief Narrative:    Beverly Martin is a 65 y.o. female with medical history significant for systolic heart failure, diabetes, coronary disease status post CABG, hypertension, hyperlipidemia CKD 4/5 presents to the ED from PCP office shortly after returning home for hyperkalemia and elevated creatinine.  Creatinine continues to worsen and she appears to have tremors in her upper extremities. Cardiology consulted for CHF.  She is in good spirits today about trying HD . IR consulted for [facility] Placement. Once she is able to tolerate HD, will need VVS for permanent access.  Further recommendations as per nephrology.   Assessment & Plan:   Principal Problem:   Hyperkalemia Active Problems:   Essential hypertension   Type 2 diabetes mellitus with hyperlipidemia (HCC)   CAD (coronary artery disease)   Elevated troponin   CKD (chronic kidney disease) stage 4, GFR 15-29 ml/min (HCC)   Chronic systolic heart failure (HCC)   Macrocytic anemia   Pressure injury of skin   AKI (acute kidney injury) ([place])    Acute on Stage 4 CKD:  - suspect this is her new baseline. Creatinine around 4.55 today, BUN is 94. Potassium wnl. Phos is 5.2 - nephrology on board and appreciate recommendations.  - monitor urine output and repeat renal parameters in am.  - transitioned to torsemide 40 mg daily and weekly metolazone on discharge.  - plan for a trial of HD tomorrow.  - ir consulted for tunneled HD catheter placement.  - Once she is able to tolerate HD, will need VVS for permanent access.     Chronic systolic and diastolic heart failure  Echocardiogram on 04/25/21 showed Left ventricular ejection fraction, by estimation, is 30 to 35%, with  moderately decreased function,  global hypokinesis and  mild left  ventricular  hypertrophy. Left ventricular diastolic parameters are consistent with Grade II diastolic dysfunction. Right ventricular systolic functin is mod reduced with elevated pulm artery systolic pressure.  Continue with bidil. She is transitioned to 40 mg of torsemide.  Continue with strict intake and output.  Daily weights.  Cardiology consulted, suggested she would not be able to tolerate HD with her degree of HF.  Palliative care will be consulted for goals of care.     Hypertension;  Well controlled.    Type 2 dm with stage 4 KD - CBG (last 3)  Recent Labs    05/15/21 2105 05/16/21 0734 05/16/21 1147  GLUCAP 133* 177* 133*    Resume SSI. Hemoglobin A1c is 9.1 in June 2022.  No changes in meds.   Hyperkalemia:  Resolved with lokelma.    Metabolic alkalosis:  Improving.     Anemia of chronic disease:  Hemoglobin between 8 to 9.  Transfuse to keep hemoglobin greater than 7.  Check iron panel.   Hyperlipidemia:  Continue with lipitor.    CAD:  Denies any chest pain or sob.   Resume aspirin and lipitor.    Stage 1 pressure injury on right buttock.  Pressure Injury 05/06/2021 Buttocks Right;Left Stage 1 -  Intact skin with non-blanchable redness of a localized area usually over a bony prominence. blanchable redness dressing applied to protect area (Active)  04/17/2021 2205  Location: Buttocks  Location Orientation: Right;Left  Staging: Stage 1 -  Intact  skin with non-blanchable redness of a localized area usually over a bony prominence.  Wound Description (Comments): blanchable redness dressing applied to protect area  Present on Admission: Yes   Continue with foam dressing.    Mild thrombocytopenia Platelets on admission were wnl.  Platelet dropped to 65,000. We will d/c heparin.      DVT prophylaxis: scd's Code Status: full code.  Family Communication: none at bedside. Called her husband and updated the plan.  Disposition:   Status is:  Inpatient  Remains inpatient appropriate because:Ongoing diagnostic testing needed not appropriate for outpatient work up, Unsafe d/c plan, and IV treatments appropriate due to intensity of illness or inability to take PO  Dispo: The patient is from: Home              Anticipated d/c is to: Home              Patient currently is not medically stable to d/c.   Difficult to place patient No       Consultants:  Nephrology.   Procedures: none.   Antimicrobials: none.    Subjective: Appears in good spirits. No chest pain. Some sob and feeling weak .   Objective: Vitals:   05/16/21 0920 05/16/21 0925 05/16/21 0940 05/16/21 0955  BP: 139/73 133/69 134/69 135/70  Pulse: 92 92 86 89  Resp: '11 16 17 19  '$ Temp:    98.8 F (37.1 C)  TempSrc:    Oral  SpO2: 100% 100% 100% 100%  Weight:      Height:        Intake/Output Summary (Last 24 hours) at 05/16/2021 1218 Last data filed at 05/16/2021 1144 Gross per 24 hour  Intake 250.04 ml  Output 450 ml  Net -199.96 ml    Filed Weights   05/14/21 0415 05/15/21 0400 05/16/21 0500  Weight: 48 kg 50.1 kg 50.2 kg    Examination:  General exam: Cachectic looking lady, ill-appearing, does not appear to be in distress Respiratory system: Air entry fair bilateral, no wheezing heard on 1 L of nasal cannula oxygen Cardiovascular system: Regular rate rhythm, murmur present, no pedal edema Gastrointestinal system: Abdomen is soft, nontender, nondistended, bowel sounds normal Central nervous system: Alert, able to answer all questions Extremities: No leg edema Skin: Stage I pressure injury on the right buttock present Psychiatry: Mood is appropriate    Data Reviewed: I have personally reviewed following labs and imaging studies  CBC: Recent Labs  Lab 05/11/2021 1557 05/11/2021 1855 05/11/21 0147 05/16/21 0317  WBC 5.1 5.1 5.9 4.3  NEUTROABS 4.5  --  5.2  --   HGB 9.1* 8.7* 9.1* 7.9*  HCT 30.5* 29.6* 30.1* 25.8*  MCV 110.1* 110.0*  108.7* 107.1*  PLT 168 148* 148* 65*     Basic Metabolic Panel: Recent Labs  Lab 05/11/2021 1855 05/11/21 0147 05/11/21 1805 05/12/21 1846 05/13/21 0614 05/14/21 0754 05/15/21 0152 05/16/21 0317  NA  --  141   < > 142 143 142 141 138  K  --  5.6*   < > 4.1 4.3 4.6 4.4 4.1  CL  --  88*   < > 90* 90* 88* 87* 86*  CO2  --  43*   < > 43* 44* 44* 43* 39*  GLUCOSE  --  273*   < > 101* 165* 204* 139* 158*  BUN  --  108*   < > 93* 89* 89* 93* 94*  CREATININE 4.59* 4.65*   < > 3.87*  3.94* 4.02* 4.26* 4.55*  CALCIUM  --  9.6   < > 9.4 9.2 9.1 9.0 8.9  MG  --  2.8*  --   --   --   --   --   --   PHOS 5.8*  --   --   --   --  6.6* 7.0* 5.2*   < > = values in this interval not displayed.     GFR: Estimated Creatinine Clearance: 9.6 mL/min (A) (by C-G formula based on SCr of 4.55 mg/dL (H)).  Liver Function Tests: Recent Labs  Lab 05/11/21 0147 05/14/21 0754 05/15/21 0152 05/16/21 0317  AST 13*  --   --   --   ALT 17  --   --   --   ALKPHOS 68  --   --   --   BILITOT 1.1  --   --   --   PROT 6.5  --   --   --   ALBUMIN 3.7 3.5 3.5 3.3*     CBG: Recent Labs  Lab 05/15/21 1146 05/15/21 1637 05/15/21 2105 05/16/21 0734 05/16/21 1147  GLUCAP 153* 178* 133* 177* 133*      Recent Results (from the past 240 hour(s))  Resp Panel by RT-PCR (Flu A&B, Covid) Nasopharyngeal Swab     Status: None   Collection Time: 05/04/2021  6:37 PM   Specimen: Nasopharyngeal Swab; Nasopharyngeal(NP) swabs in vial transport medium  Result Value Ref Range Status   SARS Coronavirus 2 by RT PCR NEGATIVE NEGATIVE Final    Comment: (NOTE) SARS-CoV-2 target nucleic acids are NOT DETECTED.  The SARS-CoV-2 RNA is generally detectable in upper respiratory specimens during the acute phase of infection. The lowest concentration of SARS-CoV-2 viral copies this assay can detect is 138 copies/mL. A negative result does not preclude SARS-Cov-2 infection and should not be used as the sole basis for  treatment or other patient management decisions. A negative result may occur with  improper specimen collection/handling, submission of specimen other than nasopharyngeal swab, presence of viral mutation(s) within the areas targeted by this assay, and inadequate number of viral copies(<138 copies/mL). A negative result must be combined with clinical observations, patient history, and epidemiological information. The expected result is Negative.  Fact Sheet for Patients:  EntrepreneurPulse.com.au  Fact Sheet for Healthcare Providers:  IncredibleEmployment.be  This test is no t yet approved or cleared by the [place] FDA and  has been authorized for detection and/or diagnosis of SARS-CoV-2 by FDA under an Emergency Use Authorization (EUA). This EUA will remain  in effect (meaning this test can be used) for the duration of the COVID-19 declaration under Section 564(b)(1) of the Act, 21 U.S.C.section 360bbb-3(b)(1), unless the authorization is terminated  or revoked sooner.       Influenza A by PCR NEGATIVE NEGATIVE Final   Influenza B by PCR NEGATIVE NEGATIVE Final    Comment: (NOTE) The Xpert Xpress SARS-CoV-2/FLU/RSV plus assay is intended as an aid in the diagnosis of influenza from Nasopharyngeal swab specimens and should not be used as a sole basis for treatment. Nasal washings and aspirates are unacceptable for Xpert Xpress SARS-CoV-2/FLU/RSV testing.  Fact Sheet for Patients: EntrepreneurPulse.com.au  Fact Sheet for Healthcare Providers: IncredibleEmployment.be  This test is not yet approved or cleared by the [place] FDA and has been authorized for detection and/or diagnosis of SARS-CoV-2 by FDA under an Emergency Use Authorization (EUA). This EUA will remain in effect (meaning this test can be used)  for the duration of the COVID-19 declaration under Section 564(b)(1) of the Act, 21  U.S.C. section 360bbb-3(b)(1), unless the authorization is terminated or revoked.  Performed at [facility], [place] [address], [place], [place] [address]           Radiology Studies: IR Fluoro Guide CV Line Right  Result Date: 05/16/2021 INDICATION: Progressive renal failure EXAM: Ultrasound fluoroscopy guided tunneled dialysis catheter placement MEDICATIONS: Ancef 2 g IV; The antibiotic was administered within an appropriate time interval prior to skin puncture. ANESTHESIA/SEDATION: Moderate (conscious) sedation was employed during this procedure. A total of Versed 0.5 mg was administered intravenously. Moderate Sedation Time: 12 minutes. The patient's level of consciousness and vital signs were monitored continuously by radiology nursing throughout the procedure under my direct supervision. FLUOROSCOPY TIME:  Fluoroscopy Time: 1.1 minutes (2.9 mGy). COMPLICATIONS: None immediate. PROCEDURE: Informed written consent was obtained from the patient after a thorough discussion of the procedural risks, benefits and alternatives. All questions were addressed. Maximal Sterile Barrier Technique was utilized including caps, mask, sterile gowns, sterile gloves, sterile drape, hand hygiene and skin antiseptic. A timeout was performed prior to the initiation of the procedure. The right internal jugular vein was evaluated with ultrasound and shown to be patent. A permanent ultrasound image was obtained and placed in the patient's medical record. Using sterile gel and a sterile probe cover, the right internal jugular vein was entered with a 21 ga needle during real time ultrasound guidance. 0.018 inch guidewire placed and 21 ga needle exchanged for transitional dilator set. Utilizing fluoroscopy, 0.035 inch guidewire advanced through the needle without difficulty. Seriel dilation was performed and peel-away sheath was placed. Attention then turned to the right anterior upper chest. Following local lidocaine  administration, the hemodialysis catheter was tunneled from the chest wall to the venotomy site. The catheter was inserted through the peel-away sheath. The tip of the catheter was positioned within the right atrium using fluoroscopic guidance. All lumens of the catheter aspirated and flushed well. The dialysis lumens were locked with Heparin. The catheter was secured to the skin with suture. The insertion site was covered with a Biopatch and sterile dressing. IMPRESSION: Tunneled right IJ hemodialysis catheter ready for use. Electronically Signed   By: Miachel Roux M.D.   On: 05/16/2021 12:05   IR US Guide Vasc Access Right  Result Date: 05/16/2021 INDICATION: Progressive renal failure EXAM: Ultrasound fluoroscopy guided tunneled dialysis catheter placement MEDICATIONS: Ancef 2 g IV; The antibiotic was administered within an appropriate time interval prior to skin puncture. ANESTHESIA/SEDATION: Moderate (conscious) sedation was employed during this procedure. A total of Versed 0.5 mg was administered intravenously. Moderate Sedation Time: 12 minutes. The patient's level of consciousness and vital signs were monitored continuously by radiology nursing throughout the procedure under my direct supervision. FLUOROSCOPY TIME:  Fluoroscopy Time: 1.1 minutes (2.9 mGy). COMPLICATIONS: None immediate. PROCEDURE: Informed written consent was obtained from the patient after a thorough discussion of the procedural risks, benefits and alternatives. All questions were addressed. Maximal Sterile Barrier Technique was utilized including caps, mask, sterile gowns, sterile gloves, sterile drape, hand hygiene and skin antiseptic. A timeout was performed prior to the initiation of the procedure. The right internal jugular vein was evaluated with ultrasound and shown to be patent. A permanent ultrasound image was obtained and placed in the patient's medical record. Using sterile gel and a sterile probe cover, the right internal  jugular vein was entered with a 21 ga needle during real time ultrasound  guidance. 0.018 inch guidewire placed and 21 ga needle exchanged for transitional dilator set. Utilizing fluoroscopy, 0.035 inch guidewire advanced through the needle without difficulty. Seriel dilation was performed and peel-away sheath was placed. Attention then turned to the right anterior upper chest. Following local lidocaine administration, the hemodialysis catheter was tunneled from the chest wall to the venotomy site. The catheter was inserted through the peel-away sheath. The tip of the catheter was positioned within the right atrium using fluoroscopic guidance. All lumens of the catheter aspirated and flushed well. The dialysis lumens were locked with Heparin. The catheter was secured to the skin with suture. The insertion site was covered with a Biopatch and sterile dressing. IMPRESSION: Tunneled right IJ hemodialysis catheter ready for use. Electronically Signed   By: Miachel Roux M.D.   On: 05/16/2021 12:05        Scheduled Meds:  aspirin  81 mg Oral Daily   atorvastatin  40 mg Oral Daily   calcitRIOL  0.25 mcg Oral Daily   Chlorhexidine Gluconate Cloth  6 each Topical Q0600   heparin  5,000 Units Subcutaneous BID   heparin sodium (porcine)       insulin aspart  0-9 Units Subcutaneous TID WC   isosorbide-hydrALAZINE  1 tablet Oral TID   lactulose  10 g Oral Daily   lidocaine-EPINEPHrine       midazolam       sevelamer carbonate  800 mg Oral TID WC   torsemide  40 mg Oral Daily   Continuous Infusions:   LOS: 5 days        Hosie Poisson, MD [facility]   To contact the attending provider between 7A-7P or the covering provider during after hours 7P-7A, please log into the web site [email or web address] and access using universal  password for that web site. If you do not have the password, please call the hospital operator.  05/16/2021, 12:18 PM

## 2021-05-17 DIAGNOSIS — E875 Hyperkalemia: Secondary | ICD-10-CM | POA: Diagnosis not present

## 2021-05-17 LAB — BASIC METABOLIC PANEL
Anion gap: 10 (ref 5–15)
BUN: 53 mg/dL — ABNORMAL HIGH (ref 8–23)
CO2: 34 mmol/L — ABNORMAL HIGH (ref 22–32)
Calcium: 8.7 mg/dL — ABNORMAL LOW (ref 8.9–10.3)
Chloride: 94 mmol/L — ABNORMAL LOW (ref 98–111)
Creatinine, Ser: 3.04 mg/dL — ABNORMAL HIGH (ref 0.44–1.00)
GFR, Estimated: 16 mL/min — ABNORMAL LOW (ref >60)
Glucose, Bld: 182 mg/dL — ABNORMAL HIGH (ref 70–99)
Potassium: 4.2 mmol/L (ref 3.5–5.1)
Sodium: 138 mmol/L (ref 135–145)

## 2021-05-17 LAB — IRON AND TIBC
Iron: 20 ug/dL — ABNORMAL LOW (ref 28–170)
Saturation Ratios: 9 % — ABNORMAL LOW (ref 10.4–31.8)
TIBC: 217 ug/dL — ABNORMAL LOW (ref 250–450)
UIBC: 197 ug/dL

## 2021-05-17 LAB — GLUCOSE, CAPILLARY
Glucose-Capillary: 200 mg/dL — ABNORMAL HIGH (ref 70–99)
Glucose-Capillary: 136 mg/dL — ABNORMAL HIGH (ref 70–99)
Glucose-Capillary: 168 mg/dL — ABNORMAL HIGH (ref 70–99)
Glucose-Capillary: 148 mg/dL — ABNORMAL HIGH (ref 70–99)

## 2021-05-17 LAB — HEPARIN INDUCED PLATELET AB (HIT ANTIBODY): Heparin Induced Plt Ab: 0.109 OD (ref 0.000–0.400)

## 2021-05-17 MED ORDER — DARBEPOETIN ALFA 60 MCG/0.3ML IJ SOSY
PREFILLED_SYRINGE | INTRAMUSCULAR | Status: AC
  Administered 2021-05-17: 60 ug via INTRAVENOUS
  Filled 2021-05-17: qty 0.3

## 2021-05-17 MED ORDER — DARBEPOETIN ALFA 60 MCG/0.3ML IJ SOSY: 60.0000 ug | PREFILLED_SYRINGE | INTRAMUSCULAR | Status: DC

## 2021-05-17 MED ORDER — HEPARIN SODIUM (PORCINE) 1000 UNIT/ML IJ SOLN
INTRAMUSCULAR | Status: AC
  Filled 2021-05-17: qty 1

## 2021-05-17 MED ORDER — SODIUM CHLORIDE 0.9 % IV SOLN
250.0000 mg | Freq: Every day | INTRAVENOUS | Status: AC
  Administered 2021-05-17 – 2021-05-20 (×4): 250 mg via INTRAVENOUS
  Filled 2021-05-17 (×4): qty 20

## 2021-05-17 NOTE — Progress Notes (Addendum)
Advanced Heart Failure Team Rounding Note   Primary Physician:Banks, Larene Beach Primary Cardiologist:  Nahser, Rito Ehrlich, Zhion Pevehouse AHF  Reason for Consultation: Chronic Combined Systolic and Diastolic CHF  HPI:    First HD session yesterday,  she reports session went well with no issues.  Denies dyspnea, chest pain, PND.  Wt stable ~110lbs, charted bp yesterday okay.    Objective:    Vital Signs:   Temp:  [97.6 F (36.4 C)-99 F (37.2 C)] 99 F (37.2 C) (08/05 0434) Pulse Rate:  [86-99] 99 (08/05 0434) Resp:  [11-20] 20 (08/05 0434) BP: (110-145)/(57-93) 110/57 (08/05 0434) SpO2:  [93 %-100 %] 93 % (08/05 0434) Weight:  [49.5 kg-49.7 kg] 49.7 kg (08/05 0434) Last BM Date: 05/15/21  Weight change: Filed Weights   05/16/21 0500 05/16/21 1400 05/17/21 0434  Weight: 50.2 kg 49.5 kg 49.7 kg    Intake/Output:   Intake/Output Summary (Last 24 hours) at 05/17/2021 0738 Last data filed at 05/17/2021 0436 Gross per 24 hour  Intake 437.87 ml  Output 300 ml  Net 137.87 ml      Physical Exam    General:  cachectic, No resp difficulty HEENT: normal Neck: supple. R TDC present . Carotids 2+ bilat; no bruits. No lymphadenopathy or thyromegaly appreciated. Cor: PMI nondisplaced. Regular rate & rhythm. No rubs, gallops or murmurs. Lungs: bibasilar fine rales Abdomen: soft, nontender, nondistended. No hepatosplenomegaly. No bruits or masses. Good bowel sounds. Extremities: no cyanosis, clubbing, rash, edema Neuro: alert & orientedx3, cranial nerves grossly intact. moves all 4 extremities w/o difficulty. Affect pleasant   Telemetry   NSR rate 80's-90's Personally reviewed   Labs   Basic Metabolic Panel: Recent Labs  Lab 04/19/2021 1855 05/11/21 0147 05/11/21 1805 05/13/21 [id number] 05/14/21 0754 05/15/21 0152 05/16/21 0317 05/17/21 0258  NA  --  141   < > 143 142 141 138 138  K  --  5.6*   < > 4.3 4.6 4.4 4.1 4.2  CL  --  88*   < > 90* 88* 87* 86* 94*   CO2  --  43*   < > 44* 44* 43* 39* 34*  GLUCOSE  --  273*   < > 165* 204* 139* 158* 182*  BUN  --  108*   < > 89* 89* 93* 94* 53*  CREATININE 4.59* 4.65*   < > 3.94* 4.02* 4.26* 4.55* 3.04*  CALCIUM  --  9.6   < > 9.2 9.1 9.0 8.9 8.7*  MG  --  2.8*  --   --   --   --   --   --   PHOS 5.8*  --   --   --  6.6* 7.0* 5.2*  --    < > = values in this interval not displayed.    Liver Function Tests: Recent Labs  Lab 05/11/21 0147 05/14/21 0754 05/15/21 0152 05/16/21 0317  AST 13*  --   --   --   ALT 17  --   --   --   ALKPHOS 68  --   --   --   BILITOT 1.1  --   --   --   PROT 6.5  --   --   --   ALBUMIN 3.7 3.5 3.5 3.3*   No results for input(s): LIPASE, AMYLASE in the last 168 hours. No results for input(s): AMMONIA in the last 168 hours.  CBC: Recent Labs  Lab 04/24/2021 1557 04/24/2021 1855 05/11/21  0147 05/16/21 0317  WBC 5.1 5.1 5.9 4.3  NEUTROABS 4.5  --  5.2  --   HGB 9.1* 8.7* 9.1* 7.9*  HCT 30.5* 29.6* 30.1* 25.8*  MCV 110.1* 110.0* 108.7* 107.1*  PLT 168 148* 148* 65*    Cardiac Enzymes: No results for input(s): CKTOTAL, CKMB, CKMBINDEX, TROPONINI in the last 168 hours.  BNP: BNP (last 3 results) Recent Labs    04/24/21 1223 04/21/2021 1714 05/07/2021 1850  BNP >4,500.0* >4,500.0* >4,500.0*    ProBNP (last 3 results) Recent Labs    12/06/20 0927  PROBNP >5000.0*     CBG: Recent Labs  Lab 05/15/21 2105 05/16/21 0734 05/16/21 1147 05/16/21 1718 05/16/21 2128  GLUCAP 133* 177* 133* 132* 165*    Coagulation Studies: No results for input(s): LABPROT, INR in the last 72 hours.   Imaging   IR Fluoro Guide CV Line Right  Result Date: 05/16/2021 INDICATION: Progressive renal failure EXAM: Ultrasound fluoroscopy guided tunneled dialysis catheter placement MEDICATIONS: Ancef 2 g IV; The antibiotic was administered within an appropriate time interval prior to skin puncture. ANESTHESIA/SEDATION: Moderate (conscious) sedation was employed during this  procedure. A total of Versed 0.5 mg was administered intravenously. Moderate Sedation Time: 12 minutes. The patient's level of consciousness and vital signs were monitored continuously by radiology nursing throughout the procedure under my direct supervision. FLUOROSCOPY TIME:  Fluoroscopy Time: 1.1 minutes (2.9 mGy). COMPLICATIONS: None immediate. PROCEDURE: Informed written consent was obtained from the patient after a thorough discussion of the procedural risks, benefits and alternatives. All questions were addressed. Maximal Sterile Barrier Technique was utilized including caps, mask, sterile gowns, sterile gloves, sterile drape, hand hygiene and skin antiseptic. A timeout was performed prior to the initiation of the procedure. The right internal jugular vein was evaluated with ultrasound and shown to be patent. A permanent ultrasound image was obtained and placed in the patient's medical record. Using sterile gel and a sterile probe cover, the right internal jugular vein was entered with a 21 ga needle during real time ultrasound guidance. 0.018 inch guidewire placed and 21 ga needle exchanged for transitional dilator set. Utilizing fluoroscopy, 0.035 inch guidewire advanced through the needle without difficulty. Seriel dilation was performed and peel-away sheath was placed. Attention then turned to the right anterior upper chest. Following local lidocaine administration, the hemodialysis catheter was tunneled from the chest wall to the venotomy site. The catheter was inserted through the peel-away sheath. The tip of the catheter was positioned within the right atrium using fluoroscopic guidance. All lumens of the catheter aspirated and flushed well. The dialysis lumens were locked with Heparin. The catheter was secured to the skin with suture. The insertion site was covered with a Biopatch and sterile dressing. IMPRESSION: Tunneled right IJ hemodialysis catheter ready for use. Electronically Signed   By:  Miachel Roux M.D.   On: 05/16/2021 12:05   IR US Guide Vasc Access Right  Result Date: 05/16/2021 INDICATION: Progressive renal failure EXAM: Ultrasound fluoroscopy guided tunneled dialysis catheter placement MEDICATIONS: Ancef 2 g IV; The antibiotic was administered within an appropriate time interval prior to skin puncture. ANESTHESIA/SEDATION: Moderate (conscious) sedation was employed during this procedure. A total of Versed 0.5 mg was administered intravenously. Moderate Sedation Time: 12 minutes. The patient's level of consciousness and vital signs were monitored continuously by radiology nursing throughout the procedure under my direct supervision. FLUOROSCOPY TIME:  Fluoroscopy Time: 1.1 minutes (2.9 mGy). COMPLICATIONS: None immediate. PROCEDURE: Informed written consent was obtained from the patient after  a thorough discussion of the procedural risks, benefits and alternatives. All questions were addressed. Maximal Sterile Barrier Technique was utilized including caps, mask, sterile gowns, sterile gloves, sterile drape, hand hygiene and skin antiseptic. A timeout was performed prior to the initiation of the procedure. The right internal jugular vein was evaluated with ultrasound and shown to be patent. A permanent ultrasound image was obtained and placed in the patient's medical record. Using sterile gel and a sterile probe cover, the right internal jugular vein was entered with a 21 ga needle during real time ultrasound guidance. 0.018 inch guidewire placed and 21 ga needle exchanged for transitional dilator set. Utilizing fluoroscopy, 0.035 inch guidewire advanced through the needle without difficulty. Seriel dilation was performed and peel-away sheath was placed. Attention then turned to the right anterior upper chest. Following local lidocaine administration, the hemodialysis catheter was tunneled from the chest wall to the venotomy site. The catheter was inserted through the peel-away sheath. The  tip of the catheter was positioned within the right atrium using fluoroscopic guidance. All lumens of the catheter aspirated and flushed well. The dialysis lumens were locked with Heparin. The catheter was secured to the skin with suture. The insertion site was covered with a Biopatch and sterile dressing. IMPRESSION: Tunneled right IJ hemodialysis catheter ready for use. Electronically Signed   By: Miachel Roux M.D.   On: 05/16/2021 12:05     Medications:     Current Medications:  aspirin  81 mg Oral Daily   atorvastatin  40 mg Oral Daily   calcitRIOL  0.25 mcg Oral Daily   Chlorhexidine Gluconate Cloth  6 each Topical Q0600   feeding supplement (NEPRO CARB STEADY)  237 mL Oral TID BM   insulin aspart  0-9 Units Subcutaneous TID WC   isosorbide-hydrALAZINE  1 tablet Oral TID   lactulose  10 g Oral Daily   multivitamin  1 tablet Oral QHS   sevelamer carbonate  800 mg Oral TID WC   torsemide  40 mg Oral Daily    Infusions:  sodium chloride     sodium chloride     anticoagulant sodium citrate       Assessment/Plan   1. Chronic Combined Systolic and Diastolic CHF: - Likely mixed ischemic and NICM, poorly controlled HTN, poor medication adherence may also have progressive CAD but unable to do coronary angio due to CKD 4. - Echo (12/2020): EF down from 45-50% in 2019 to 20-25%, RV moderately reduced - Echo (04/25/21): EF 30-35%, G2DD, moderately reduced RV function. - GDMT limited by renal function. - NYHA III symptoms, wt stable, REDS 36% on 8/1 - Diuretic regimen is torsemide 40 daily, metolazone once weekly but transitioning to HD for volume managment - Continue bidil 1 tab tid, doing well holding dose before sessions - Not candidate for advanced HF therapies. Has refused Palliative services in past.  - Now on dialysis    2. AKI on CKD Stage 4: - SCr baseline 3.6. Up to 4.6 on admit day back up to 4.55 today - Tolerated first HD session, second session this am  3. CAD s/p CABG  2005 - No s/s angina. - Continue ASA/statin.   4. T2DM: - Hgb A1C 9.1 (6/22). - now on insulin - Not a candidate for SGLT2i (GFR and hx of urosepsis).   5. HTN: -continue bidil  6. Thrombocytopenia: -platelets low yesterday, HIT ab sent, per primary  7.Macrocytic anemia -primary team ordered repeat iron studies which are now consistent with iron  deficiency compared with 3 weeks ago -order IV iron  Length of Stay: 6  Katherine Roan, MD  05/17/2021, 7:38 AM  Advanced Heart Failure Team Pager [phone] (M-F; [place])  Please contact [facility] for night-coverage after hours (4p -7a ) and weekends on [email or web address]     Katherine Roan, MD  7:38 AM   Patient seen and examined with the above-signed Advanced Practice Provider and/or Housestaff. I personally reviewed laboratory data, imaging studies and relevant notes. I independently examined the patient and formulated the important aspects of the plan. I have edited the note to reflect any of my changes or salient points. I have personally discussed the plan with the patient and/or family.  Patient seen in HD unit. Tolerating session #2 ok. Only complaint is tremors. BP stable in 120-130s. No SOB, orthopnea or PND  General:  Weak appearing. No resp difficulty HEENT: normal Neck: supple. JVP 7-8 Carotids 2+ bilat; no bruits. No lymphadenopathy or thryomegaly appreciated. Cor: PMI nondisplaced. Regular rate & rhythm. No rubs, gallops or murmurs. R chest TDC Lungs: clear Abdomen: soft, nontender, nondistended. No hepatosplenomegaly. No bruits or masses. Good bowel sounds. Extremities: no cyanosis, clubbing, rash, edema Neuro: alert & orientedx3, cranial nerves grossly intact. moves all 4 extremities w/o difficulty. Affect pleasant  She is tolerating HD well. Volume looks ok. BP stable. On Bidil 1 tab tid - can cut back as needed.   HF team will sign off. Will arrange outpatient f/u.   Glori Bickers, MD  11:03  AM

## 2021-05-17 NOTE — Progress Notes (Signed)
PT Cancellation Note  Patient Details Name: EDELYN WILKES MRN: [id number] DOB: 1956/05/18   Cancelled Treatment:    Reason Eval/Treat Not Completed: Patient declined, no reason specified. Pt reports fatigue and having ambulated earlier in the day, declining PT session at this time.   Zenaida Niece 05/17/2021, 4:00 PM

## 2021-05-17 NOTE — Procedures (Signed)
Patient seen on Hemodialysis. BP (!) 110/57 (BP Location: Right Arm)   Pulse 99   Temp 99 F (37.2 C) (Oral)   Resp 20   Ht '5\' 5"'$  (1.651 m)   Wt 49.7 kg   SpO2 93%   BMI 18.22 kg/m   QB 300, UF goal NONE (Keeping even) Tolerating treatment without complaints at this time.   Beverly Shiley MD [facility]. Office # [phone] Pager # [phone] 9:44 AM

## 2021-05-17 NOTE — Progress Notes (Signed)
Patient ID: Beverly Martin, female   DOB: 04/16/1956, 65 y.o.   MRN: [id number] [facility] Progress Note   Assessment/ Plan:   1. Acute kidney Injury on chronic kidney disease stage IV: With history of underlying hypertension, diabetes and chronic cardiorenal syndrome now with increasing worsening of renal function and problems with volume overload.  She remains on torsemide and is currently on hemodialysis for management of azotemia/mild uremic symptoms.  She is getting hemodialysis via [facility] with plans for permanent access deferred until we confirm her ability to tolerate renal replacement therapy.  Appears hemodynamically stable 2.  Acute exacerbation of combined diastolic/systolic congestive heart failure: Poor response to diuretic treatment earlier and now on hemodialysis for volume/management of azotemia. 3.  Metabolic alkalosis: Secondary to diuresis/contraction-monitor with hemodialysis 4.  Anemia of chronic illness/chronic kidney disease: Significant iron deficiency seen on labs this morning: We will begin intravenous iron to complement ESA. 5.  Chronic kidney disease/metabolic bone disease: Phosphorus levels continue to improve with ongoing hemodialysis and low-dose sevelamer 3 times daily AC.  Continue calcitriol for PTH control.  Subjective:   Complains of some intermittent tremors and confirms improvement of her shortness of breath without any chest pain.   Objective:   BP (!) 126/51   Pulse 97   Temp 98.3 F (36.8 C) (Oral)   Resp (!) 22   Ht '5\' 5"'$  (1.651 m)   Wt 49.7 kg   SpO2 100%   BMI 18.22 kg/m   Intake/Output Summary (Last 24 hours) at 05/17/2021 0950 Last data filed at 05/17/2021 0436 Gross per 24 hour  Intake 437.87 ml  Output 300 ml  Net 137.87 ml   Weight change: -0.7 kg  Physical Exam: Gen: Comfortably resting on dialysis, on oxygen via nasal cannula CVS: Pulse regular rhythm, normal rate, S1 and S2 normal Resp: Clear to auscultation bilaterally,  no rales/rhonchi Abd: Soft, flat, nontender, bowel sounds normal Ext: No palpable lower extremity edema  Imaging: IR Fluoro Guide CV Line Right  Result Date: 05/16/2021 INDICATION: Progressive renal failure EXAM: Ultrasound fluoroscopy guided tunneled dialysis catheter placement MEDICATIONS: Ancef 2 g IV; The antibiotic was administered within an appropriate time interval prior to skin puncture. ANESTHESIA/SEDATION: Moderate (conscious) sedation was employed during this procedure. A total of Versed 0.5 mg was administered intravenously. Moderate Sedation Time: 12 minutes. The patient's level of consciousness and vital signs were monitored continuously by radiology nursing throughout the procedure under my direct supervision. FLUOROSCOPY TIME:  Fluoroscopy Time: 1.1 minutes (2.9 mGy). COMPLICATIONS: None immediate. PROCEDURE: Informed written consent was obtained from the patient after a thorough discussion of the procedural risks, benefits and alternatives. All questions were addressed. Maximal Sterile Barrier Technique was utilized including caps, mask, sterile gowns, sterile gloves, sterile drape, hand hygiene and skin antiseptic. A timeout was performed prior to the initiation of the procedure. The right internal jugular vein was evaluated with ultrasound and shown to be patent. A permanent ultrasound image was obtained and placed in the patient's medical record. Using sterile gel and a sterile probe cover, the right internal jugular vein was entered with a 21 ga needle during real time ultrasound guidance. 0.018 inch guidewire placed and 21 ga needle exchanged for transitional dilator set. Utilizing fluoroscopy, 0.035 inch guidewire advanced through the needle without difficulty. Seriel dilation was performed and peel-away sheath was placed. Attention then turned to the right anterior upper chest. Following local lidocaine administration, the hemodialysis catheter was tunneled from the chest wall to the  venotomy  site. The catheter was inserted through the peel-away sheath. The tip of the catheter was positioned within the right atrium using fluoroscopic guidance. All lumens of the catheter aspirated and flushed well. The dialysis lumens were locked with Heparin. The catheter was secured to the skin with suture. The insertion site was covered with a Biopatch and sterile dressing. IMPRESSION: Tunneled right IJ hemodialysis catheter ready for use. Electronically Signed   By: Miachel Roux M.D.   On: 05/16/2021 12:05   IR US Guide Vasc Access Right  Result Date: 05/16/2021 INDICATION: Progressive renal failure EXAM: Ultrasound fluoroscopy guided tunneled dialysis catheter placement MEDICATIONS: Ancef 2 g IV; The antibiotic was administered within an appropriate time interval prior to skin puncture. ANESTHESIA/SEDATION: Moderate (conscious) sedation was employed during this procedure. A total of Versed 0.5 mg was administered intravenously. Moderate Sedation Time: 12 minutes. The patient's level of consciousness and vital signs were monitored continuously by radiology nursing throughout the procedure under my direct supervision. FLUOROSCOPY TIME:  Fluoroscopy Time: 1.1 minutes (2.9 mGy). COMPLICATIONS: None immediate. PROCEDURE: Informed written consent was obtained from the patient after a thorough discussion of the procedural risks, benefits and alternatives. All questions were addressed. Maximal Sterile Barrier Technique was utilized including caps, mask, sterile gowns, sterile gloves, sterile drape, hand hygiene and skin antiseptic. A timeout was performed prior to the initiation of the procedure. The right internal jugular vein was evaluated with ultrasound and shown to be patent. A permanent ultrasound image was obtained and placed in the patient's medical record. Using sterile gel and a sterile probe cover, the right internal jugular vein was entered with a 21 ga needle during real time ultrasound guidance.  0.018 inch guidewire placed and 21 ga needle exchanged for transitional dilator set. Utilizing fluoroscopy, 0.035 inch guidewire advanced through the needle without difficulty. Seriel dilation was performed and peel-away sheath was placed. Attention then turned to the right anterior upper chest. Following local lidocaine administration, the hemodialysis catheter was tunneled from the chest wall to the venotomy site. The catheter was inserted through the peel-away sheath. The tip of the catheter was positioned within the right atrium using fluoroscopic guidance. All lumens of the catheter aspirated and flushed well. The dialysis lumens were locked with Heparin. The catheter was secured to the skin with suture. The insertion site was covered with a Biopatch and sterile dressing. IMPRESSION: Tunneled right IJ hemodialysis catheter ready for use. Electronically Signed   By: Miachel Roux M.D.   On: 05/16/2021 12:05    Labs: BMET Recent Labs  Lab 04/14/2021 1855 05/11/21 0147 05/12/21 [id number] 05/12/21 1846 05/13/21 [id number] 05/14/21 0754 05/15/21 0152 05/16/21 0317 05/17/21 0258  NA  --    < > 142 142 143 142 141 138 138  K  --    < > 4.9 4.1 4.3 4.6 4.4 4.1 4.2  CL  --    < > 88* 90* 90* 88* 87* 86* 94*  CO2  --    < > 44* 43* 44* 44* 43* 39* 34*  GLUCOSE  --    < > 248* 101* 165* 204* 139* 158* 182*  BUN  --    < > 97* 93* 89* 89* 93* 94* 53*  CREATININE 4.59*   < > 4.13* 3.87* 3.94* 4.02* 4.26* 4.55* 3.04*  CALCIUM  --    < > 9.2 9.4 9.2 9.1 9.0 8.9 8.7*  PHOS 5.8*  --   --   --   --  6.6* 7.0* 5.2*  --    < > =  values in this interval not displayed.   CBC Recent Labs  Lab 05/04/2021 1557 04/18/2021 1855 05/11/21 0147 05/16/21 0317  WBC 5.1 5.1 5.9 4.3  NEUTROABS 4.5  --  5.2  --   HGB 9.1* 8.7* 9.1* 7.9*  HCT 30.5* 29.6* 30.1* 25.8*  MCV 110.1* 110.0* 108.7* 107.1*  PLT 168 148* 148* 65*    Medications:     aspirin  81 mg Oral Daily   atorvastatin  40 mg Oral Daily   calcitRIOL  0.25  mcg Oral Daily   Chlorhexidine Gluconate Cloth  6 each Topical Q0600   feeding supplement (NEPRO CARB STEADY)  237 mL Oral TID BM   insulin aspart  0-9 Units Subcutaneous TID WC   isosorbide-hydrALAZINE  1 tablet Oral TID   lactulose  10 g Oral Daily   multivitamin  1 tablet Oral QHS   sevelamer carbonate  800 mg Oral TID WC   torsemide  40 mg Oral Daily   Elmarie Shiley, MD 05/17/2021, 9:50 AM

## 2021-05-17 NOTE — Progress Notes (Signed)
PROGRESS NOTE    JOEN HEVNER  [id number] DOB: 1955/12/18 DOA: 04/16/2021 PCP: Billie Ruddy, MD    Brief Narrative:  LORING FREYTES is a 65 year old female with past medical history significant for chronic systolic congestive heart failure, type 2 diabetes mellitus, CAD s/p CABG, essential hypertension, hyperlipidemia, CKD stage IV who presents to [facility] ED on 7/29 from PCP office with hyperkalemia and elevated creatinine.  Patient reports shortness of breath, generalized weakness, some chest tightness with increased lower extremity edema over the last week.    Recent TTE March 2022 with LVEF 20-25%.  In the ED, BP 145/74, HR 90, SPO2 within normal limits on 2 L nasal cannula.  Creatinine elevated 4.61 (3.59 on 7/19).  Chest x-ray with left greater than right small pleural effusions with basilar atelectasis.  EKG with normal sinus rhythm.  High sensitive troponin 46.  BNP 4500.  Glucose 238.  Cardiology nephrology were consulted.  TRH consulted for further evaluation management of acute on chronic renal failure with decompensated systolic congestive heart failure.   Assessment & Plan:   Principal Problem:   Hyperkalemia Active Problems:   Essential hypertension   Type 2 diabetes mellitus with hyperlipidemia (HCC)   CAD (coronary artery disease)   Elevated troponin   CKD (chronic kidney disease) stage 4, GFR 15-29 ml/min (HCC)   Chronic systolic heart failure (HCC)   Macrocytic anemia   Pressure injury of skin   AKI (acute kidney injury) ([place])   Acute renal failure on CKD stage IV Patient presenting from PCP office with elevated creatinine.  Creatinine 3.59 on 7/19 now up to 4.61.  Etiology likely secondary to underlying history of hypertension, diabetes and chronic cardiorenal syndrome.  Underwent IR placement of tunneled dialysis catheter on 05/16/2021. --Nephrology following, appreciate assistance --Torsemide 40 mg p.o. daily --Continue HD for volume management, receiving  today --BMP daily  Acute on chronic combined systolic/diastolic congestive heart failure Essential hypertension Patient presenting with increased peripheral edema, elevated BNP 4500.  Etiology likely secondary to progressive renal failure, now HD dependent as above.  Cardiology was initially consulted, signed off on 05/17/2021. --Continue volume management with HD as above --Isosorbide-hydralazine 20-37.5 mg p.o. 3 times daily --Torsemide 40 mg p.o. daily --Continue aspirin and statin --Strict I's and O's and daily weights  CAD s/p CABG: Continue aspirin and statin  Type 2 diabetes mellitus Hemoglobin A1c 7.5 on 05/11/2021, fairly well controlled.  Diet controlled at home. --Sensitive SSI for coverage --CBG qAC/HS    DVT prophylaxis: Place and maintain sequential compression device Start: 05/16/21 1224 SCDs Start: 04/19/2021 1855   Code Status: Full Code Family Communication: No family present at bedside this morning  Disposition Plan:  Level of care: Telemetry Medical Status is: Inpatient  Remains inpatient appropriate because:Ongoing diagnostic testing needed not appropriate for outpatient work up, Unsafe d/c plan, IV treatments appropriate due to intensity of illness or inability to take PO, and Inpatient level of care appropriate due to severity of illness  Dispo: The patient is from: Home              Anticipated d/c is to: Home              Patient currently is not medically stable to d/c.   Difficult to place patient No  Consultants:  Nephrology Cardiology/heart failure service -signed off 8/5  Procedures:  Tunneled HD catheter placement by IR on 8/4  Antimicrobials:  Cefazolin 8/4 - 8/4  Subjective: Patient seen examined at bedside,  resting comfortably.  Currently in HD receiving dialysis.  Tolerating dialysis well.  No complaints this morning.  Specifically denies headache, no fever/chills/night sweats, no nausea cefonicid diarrhea, no chest pain, palpitations, no  shortness of breath, no abdominal pain, no weakness, no fatigue, no paresthesias.  No acute events overnight per nursing staff.  Objective: Vitals:   05/17/21 1030 05/17/21 1100 05/17/21 1151 05/17/21 1304  BP: 126/64 131/74 128/66 127/64  Pulse: 95  94 89  Resp: (!) 23 (!) 21 (!) 22 (!) 24  Temp:  98.3 F (36.8 C) 98.2 F (36.8 C) 98.2 F (36.8 C)  TempSrc:  Oral Oral Oral  SpO2: 100% 100%  93%  Weight:  50.3 kg    Height:        Intake/Output Summary (Last 24 hours) at 05/17/2021 1624 Last data filed at 05/17/2021 1507 Gross per 24 hour  Intake 646.9 ml  Output 200 ml  Net 446.9 ml   Filed Weights   05/17/21 0434 05/17/21 0811 05/17/21 1100  Weight: 49.7 kg 50.3 kg 50.3 kg    Examination:  General exam: Appears calm and comfortable, elderly in appearance, appears older than stated age Respiratory system: Clear to auscultation. Respiratory effort normal.  On room air Cardiovascular system: S1 & S2 heard, RRR. No JVD, murmurs, rubs, gallops or clicks. No pedal edema.  Tunneled HD catheter noted to right chest Gastrointestinal system: Abdomen is nondistended, soft and nontender. No organomegaly or masses felt. Normal bowel sounds heard. Central nervous system: Alert and oriented. No focal neurological deficits. Extremities: Symmetric 5 x 5 power. Skin: No rashes, lesions or ulcers Psychiatry: Judgement and insight appear normal. Mood & affect appropriate.     Data Reviewed: I have personally reviewed following labs and imaging studies  CBC: Recent Labs  Lab 05/07/2021 1855 05/11/21 0147 05/16/21 0317  WBC 5.1 5.9 4.3  NEUTROABS  --  5.2  --   HGB 8.7* 9.1* 7.9*  HCT 29.6* 30.1* 25.8*  MCV 110.0* 108.7* 107.1*  PLT 148* 148* 65*   Basic Metabolic Panel: Recent Labs  Lab 04/27/2021 1855 05/11/21 0147 05/11/21 1805 05/13/21 0614 05/14/21 0754 05/15/21 0152 05/16/21 0317 05/17/21 0258  NA  --  141   < > 143 142 141 138 138  K  --  5.6*   < > 4.3 4.6 4.4 4.1  4.2  CL  --  88*   < > 90* 88* 87* 86* 94*  CO2  --  43*   < > 44* 44* 43* 39* 34*  GLUCOSE  --  273*   < > 165* 204* 139* 158* 182*  BUN  --  108*   < > 89* 89* 93* 94* 53*  CREATININE 4.59* 4.65*   < > 3.94* 4.02* 4.26* 4.55* 3.04*  CALCIUM  --  9.6   < > 9.2 9.1 9.0 8.9 8.7*  MG  --  2.8*  --   --   --   --   --   --   PHOS 5.8*  --   --   --  6.6* 7.0* 5.2*  --    < > = values in this interval not displayed.   GFR: Estimated Creatinine Clearance: 14.5 mL/min (A) (by C-G formula based on SCr of 3.04 mg/dL (H)). Liver Function Tests: Recent Labs  Lab 05/11/21 0147 05/14/21 0754 05/15/21 0152 05/16/21 0317  AST 13*  --   --   --   ALT 17  --   --   --  ALKPHOS 68  --   --   --   BILITOT 1.1  --   --   --   PROT 6.5  --   --   --   ALBUMIN 3.7 3.5 3.5 3.3*   No results for input(s): LIPASE, AMYLASE in the last 168 hours. No results for input(s): AMMONIA in the last 168 hours. Coagulation Profile: No results for input(s): INR, PROTIME in the last 168 hours. Cardiac Enzymes: No results for input(s): CKTOTAL, CKMB, CKMBINDEX, TROPONINI in the last 168 hours. BNP (last 3 results) Recent Labs    12/06/20 0927  PROBNP >5000.0*   HbA1C: No results for input(s): HGBA1C in the last 72 hours. CBG: Recent Labs  Lab 05/16/21 1718 05/16/21 2128 05/17/21 0741 05/17/21 1303 05/17/21 1615  GLUCAP 132* 165* 200* 136* 168*   Lipid Profile: No results for input(s): CHOL, HDL, LDLCALC, TRIG, CHOLHDL, LDLDIRECT in the last 72 hours. Thyroid Function Tests: No results for input(s): TSH, T4TOTAL, FREET4, T3FREE, THYROIDAB in the last 72 hours. Anemia Panel: Recent Labs    05/17/21 0258  TIBC 217*  IRON 20*   Sepsis Labs: No results for input(s): PROCALCITON, LATICACIDVEN in the last 168 hours.  Recent Results (from the past 240 hour(s))  Resp Panel by RT-PCR (Flu A&B, Covid) Nasopharyngeal Swab     Status: None   Collection Time: 04/25/2021  6:37 PM   Specimen:  Nasopharyngeal Swab; Nasopharyngeal(NP) swabs in vial transport medium  Result Value Ref Range Status   SARS Coronavirus 2 by RT PCR NEGATIVE NEGATIVE Final    Comment: (NOTE) SARS-CoV-2 target nucleic acids are NOT DETECTED.  The SARS-CoV-2 RNA is generally detectable in upper respiratory specimens during the acute phase of infection. The lowest concentration of SARS-CoV-2 viral copies this assay can detect is 138 copies/mL. A negative result does not preclude SARS-Cov-2 infection and should not be used as the sole basis for treatment or other patient management decisions. A negative result may occur with  improper specimen collection/handling, submission of specimen other than nasopharyngeal swab, presence of viral mutation(s) within the areas targeted by this assay, and inadequate number of viral copies(<138 copies/mL). A negative result must be combined with clinical observations, patient history, and epidemiological information. The expected result is Negative.  Fact Sheet for Patients:  EntrepreneurPulse.com.au  Fact Sheet for Healthcare Providers:  IncredibleEmployment.be  This test is no t yet approved or cleared by the [place] FDA and  has been authorized for detection and/or diagnosis of SARS-CoV-2 by FDA under an Emergency Use Authorization (EUA). This EUA will remain  in effect (meaning this test can be used) for the duration of the COVID-19 declaration under Section 564(b)(1) of the Act, 21 U.S.C.section 360bbb-3(b)(1), unless the authorization is terminated  or revoked sooner.       Influenza A by PCR NEGATIVE NEGATIVE Final   Influenza B by PCR NEGATIVE NEGATIVE Final    Comment: (NOTE) The Xpert Xpress SARS-CoV-2/FLU/RSV plus assay is intended as an aid in the diagnosis of influenza from Nasopharyngeal swab specimens and should not be used as a sole basis for treatment. Nasal washings and aspirates are unacceptable for  Xpert Xpress SARS-CoV-2/FLU/RSV testing.  Fact Sheet for Patients: EntrepreneurPulse.com.au  Fact Sheet for Healthcare Providers: IncredibleEmployment.be  This test is not yet approved or cleared by the [place] FDA and has been authorized for detection and/or diagnosis of SARS-CoV-2 by FDA under an Emergency Use Authorization (EUA). This EUA will remain in effect (meaning this test  can be used) for the duration of the COVID-19 declaration under Section 564(b)(1) of the Act, 21 U.S.C. section 360bbb-3(b)(1), unless the authorization is terminated or revoked.  Performed at [facility], [place] [address] [place],  [address]          Radiology Studies: IR Fluoro Guide CV Line Right  Result Date: 05/16/2021 INDICATION: Progressive renal failure EXAM: Ultrasound fluoroscopy guided tunneled dialysis catheter placement MEDICATIONS: Ancef 2 g IV; The antibiotic was administered within an appropriate time interval prior to skin puncture. ANESTHESIA/SEDATION: Moderate (conscious) sedation was employed during this procedure. A total of Versed 0.5 mg was administered intravenously. Moderate Sedation Time: 12 minutes. The patient's level of consciousness and vital signs were monitored continuously by radiology nursing throughout the procedure under my direct supervision. FLUOROSCOPY TIME:  Fluoroscopy Time: 1.1 minutes (2.9 mGy). COMPLICATIONS: None immediate. PROCEDURE: Informed written consent was obtained from the patient after a thorough discussion of the procedural risks, benefits and alternatives. All questions were addressed. Maximal Sterile Barrier Technique was utilized including caps, mask, sterile gowns, sterile gloves, sterile drape, hand hygiene and skin antiseptic. A timeout was performed prior to the initiation of the procedure. The right internal jugular vein was evaluated with ultrasound and shown to be patent. A permanent ultrasound  image was obtained and placed in the patient's medical record. Using sterile gel and a sterile probe cover, the right internal jugular vein was entered with a 21 ga needle during real time ultrasound guidance. 0.018 inch guidewire placed and 21 ga needle exchanged for transitional dilator set. Utilizing fluoroscopy, 0.035 inch guidewire advanced through the needle without difficulty. Seriel dilation was performed and peel-away sheath was placed. Attention then turned to the right anterior upper chest. Following local lidocaine administration, the hemodialysis catheter was tunneled from the chest wall to the venotomy site. The catheter was inserted through the peel-away sheath. The tip of the catheter was positioned within the right atrium using fluoroscopic guidance. All lumens of the catheter aspirated and flushed well. The dialysis lumens were locked with Heparin. The catheter was secured to the skin with suture. The insertion site was covered with a Biopatch and sterile dressing. IMPRESSION: Tunneled right IJ hemodialysis catheter ready for use. Electronically Signed   By: Miachel Roux M.D.   On: 05/16/2021 12:05   IR US Guide Vasc Access Right  Result Date: 05/16/2021 INDICATION: Progressive renal failure EXAM: Ultrasound fluoroscopy guided tunneled dialysis catheter placement MEDICATIONS: Ancef 2 g IV; The antibiotic was administered within an appropriate time interval prior to skin puncture. ANESTHESIA/SEDATION: Moderate (conscious) sedation was employed during this procedure. A total of Versed 0.5 mg was administered intravenously. Moderate Sedation Time: 12 minutes. The patient's level of consciousness and vital signs were monitored continuously by radiology nursing throughout the procedure under my direct supervision. FLUOROSCOPY TIME:  Fluoroscopy Time: 1.1 minutes (2.9 mGy). COMPLICATIONS: None immediate. PROCEDURE: Informed written consent was obtained from the patient after a thorough discussion of  the procedural risks, benefits and alternatives. All questions were addressed. Maximal Sterile Barrier Technique was utilized including caps, mask, sterile gowns, sterile gloves, sterile drape, hand hygiene and skin antiseptic. A timeout was performed prior to the initiation of the procedure. The right internal jugular vein was evaluated with ultrasound and shown to be patent. A permanent ultrasound image was obtained and placed in the patient's medical record. Using sterile gel and a sterile probe cover, the right internal jugular vein was entered with a 21 ga needle during real time  ultrasound guidance. 0.018 inch guidewire placed and 21 ga needle exchanged for transitional dilator set. Utilizing fluoroscopy, 0.035 inch guidewire advanced through the needle without difficulty. Seriel dilation was performed and peel-away sheath was placed. Attention then turned to the right anterior upper chest. Following local lidocaine administration, the hemodialysis catheter was tunneled from the chest wall to the venotomy site. The catheter was inserted through the peel-away sheath. The tip of the catheter was positioned within the right atrium using fluoroscopic guidance. All lumens of the catheter aspirated and flushed well. The dialysis lumens were locked with Heparin. The catheter was secured to the skin with suture. The insertion site was covered with a Biopatch and sterile dressing. IMPRESSION: Tunneled right IJ hemodialysis catheter ready for use. Electronically Signed   By: Miachel Roux M.D.   On: 05/16/2021 12:05        Scheduled Meds:  aspirin  81 mg Oral Daily   atorvastatin  40 mg Oral Daily   calcitRIOL  0.25 mcg Oral Daily   Chlorhexidine Gluconate Cloth  6 each Topical Q0600   darbepoetin (ARANESP) injection - DIALYSIS  60 mcg Intravenous Q Fri-HD   feeding supplement (NEPRO CARB STEADY)  237 mL Oral TID BM   heparin sodium (porcine)       insulin aspart  0-9 Units Subcutaneous TID WC    isosorbide-hydrALAZINE  1 tablet Oral TID   lactulose  10 g Oral Daily   multivitamin  1 tablet Oral QHS   sevelamer carbonate  800 mg Oral TID WC   torsemide  40 mg Oral Daily   Continuous Infusions:  anticoagulant sodium citrate     ferric gluconate (FERRLECIT) IVPB 250 mg (05/17/21 1002)     LOS: 6 days    Time spent: 41 minutes spent on chart review, discussion with nursing staff, consultants, updating family and interview/physical exam; more than 50% of that time was spent in counseling and/or coordination of care.    Jilleen Essner J [place] DO [facility] Available via Epic secure chat 7am-7pm After these hours, please refer to coverage provider listed on [email or web address] 05/17/2021, 4:24 PM

## 2021-05-18 ENCOUNTER — Inpatient Hospital Stay (HOSPITAL_COMMUNITY): Payer: BC Managed Care – PPO

## 2021-05-18 DIAGNOSIS — E875 Hyperkalemia: Secondary | ICD-10-CM | POA: Diagnosis not present

## 2021-05-18 LAB — HEPATITIS B SURFACE ANTIBODY, QUANTITATIVE: Hep B S AB Quant (Post): 73.3 m[IU]/mL (ref >9.9)

## 2021-05-18 LAB — BASIC METABOLIC PANEL
Anion gap: 9 (ref 5–15)
BUN: 29 mg/dL — ABNORMAL HIGH (ref 8–23)
CO2: 29 mmol/L (ref 22–32)
Calcium: 8.7 mg/dL — ABNORMAL LOW (ref 8.9–10.3)
Chloride: 97 mmol/L — ABNORMAL LOW (ref 98–111)
Creatinine, Ser: 2.39 mg/dL — ABNORMAL HIGH (ref 0.44–1.00)
GFR, Estimated: 22 mL/min — ABNORMAL LOW (ref >60)
Glucose, Bld: 111 mg/dL — ABNORMAL HIGH (ref 70–99)
Potassium: 4.2 mmol/L (ref 3.5–5.1)
Sodium: 135 mmol/L (ref 135–145)

## 2021-05-18 LAB — CBC
HCT: 25.7 % — ABNORMAL LOW (ref 36.0–46.0)
Hemoglobin: 7.8 g/dL — ABNORMAL LOW (ref 12.0–15.0)
MCH: 32.8 pg (ref 26.0–34.0)
MCHC: 30.4 g/dL (ref 30.0–36.0)
MCV: 108 fL — ABNORMAL HIGH (ref 80.0–100.0)
Platelets: 60 10*3/uL — ABNORMAL LOW (ref 150–400)
RBC: 2.38 MIL/uL — ABNORMAL LOW (ref 3.87–5.11)
RDW: 14.5 % (ref 11.5–15.5)
WBC: 3.9 10*3/uL — ABNORMAL LOW (ref 4.0–10.5)
nRBC: 0 % (ref 0.0–0.2)

## 2021-05-18 LAB — GLUCOSE, CAPILLARY
Glucose-Capillary: 94 mg/dL (ref 70–99)
Glucose-Capillary: 143 mg/dL — ABNORMAL HIGH (ref 70–99)
Glucose-Capillary: 180 mg/dL — ABNORMAL HIGH (ref 70–99)

## 2021-05-18 MED ORDER — HEPARIN SODIUM (PORCINE) 1000 UNIT/ML IJ SOLN
INTRAMUSCULAR | Status: AC
  Filled 2021-05-18: qty 1

## 2021-05-18 MED ORDER — CALCITRIOL 0.25 MCG PO CAPS
ORAL_CAPSULE | ORAL | Status: AC
  Filled 2021-05-18: qty 1

## 2021-05-18 MED ORDER — POLYETHYLENE GLYCOL 3350 17 G PO PACK
17.0000 g | PACK | Freq: Every day | ORAL | Status: DC
  Administered 2021-05-19 – 2021-05-20 (×2): 17 g via ORAL
  Filled 2021-05-18 (×3): qty 1

## 2021-05-18 NOTE — Progress Notes (Signed)
Patient ID: Beverly Martin, female   DOB: 02-Aug-1956, 65 y.o.   MRN: [id number] [facility] Progress Note   Assessment/ Plan:   1. Acute kidney Injury on chronic kidney disease stage IV- possibly progressed to ESRD: With history of underlying hypertension, diabetes and chronic cardiorenal syndrome now with increasing worsening of renal function and problems with volume overload- improving clinically.  She remains on torsemide and is currently on hemodialysis for management of azotemia/mild uremic symptoms.  Getting hemodialysis via [facility] with plans for permanent access deferred until we confirm her ability to tolerate renal replacement therapy.   2.  Acute exacerbation of combined diastolic/systolic congestive heart failure: Poor response to diuretic treatment earlier and now on hemodialysis for volume/management of azotemia with improving exam. 3.  Metabolic alkalosis: Secondary to diuresis/contraction-monitor with hemodialysis 4.  Anemia of chronic illness/chronic kidney disease: Significant iron deficiency seen on labs; Now on intravenous iron and ESA. 5.  Chronic kidney disease/metabolic bone disease: Phosphorus levels continue to improve with ongoing hemodialysis and low-dose sevelamer 3 times daily AC.  Continue calcitriol for PTH control.  Subjective:   Reports to be feeling better today with tremors and global weakness better.    Objective:   BP (!) 108/55 (BP Location: Right Arm)   Pulse 91 Comment: Simultaneous filing. User may not have seen previous data.  Temp 98.4 F (36.9 C) (Oral)   Resp 17 Comment: Simultaneous filing. User may not have seen previous data.  Ht '5\' 5"'$  (1.651 m)   Wt (P) 49.2 kg   SpO2 100% Comment: Simultaneous filing. User may not have seen previous data.  BMI (P) 18.05 kg/m   Intake/Output Summary (Last 24 hours) at 05/18/2021 1014 Last data filed at 05/17/2021 1507 Gross per 24 hour  Intake 246.9 ml  Output 0 ml  Net 246.9 ml   Weight change:  0.8 kg  Physical Exam: Gen: Comfortably resting in dialysis- awakens easily to conversation, on oxygen via nasal cannula CVS: Pulse regular rhythm, normal rate, S1 and S2 normal Resp: Clear to auscultation bilaterally, no rales/rhonchi. RIJ TDC Abd: Soft, flat, nontender, bowel sounds normal Ext: No palpable lower extremity edema  Imaging: No results found.  Labs: BMET Recent Labs  Lab 05/12/21 1846 05/13/21 [id number] 05/14/21 0754 05/15/21 0152 05/16/21 0317 05/17/21 0258 05/18/21 0218  NA 142 143 142 141 138 138 135  K 4.1 4.3 4.6 4.4 4.1 4.2 4.2  CL 90* 90* 88* 87* 86* 94* 97*  CO2 43* 44* 44* 43* 39* 34* 29  GLUCOSE 101* 165* 204* 139* 158* 182* 111*  BUN 93* 89* 89* 93* 94* 53* 29*  CREATININE 3.87* 3.94* 4.02* 4.26* 4.55* 3.04* 2.39*  CALCIUM 9.4 9.2 9.1 9.0 8.9 8.7* 8.7*  PHOS  --   --  6.6* 7.0* 5.2*  --   --    CBC Recent Labs  Lab 05/16/21 0317 05/18/21 0218  WBC 4.3 3.9*  HGB 7.9* 7.8*  HCT 25.8* 25.7*  MCV 107.1* 108.0*  PLT 65* 60*    Medications:     aspirin  81 mg Oral Daily   atorvastatin  40 mg Oral Daily   calcitRIOL  0.25 mcg Oral Daily   Chlorhexidine Gluconate Cloth  6 each Topical Q0600   darbepoetin (ARANESP) injection - DIALYSIS  60 mcg Intravenous Q Fri-HD   feeding supplement (NEPRO CARB STEADY)  237 mL Oral TID BM   insulin aspart  0-9 Units Subcutaneous TID WC   isosorbide-hydrALAZINE  1 tablet Oral  TID   lactulose  10 g Oral Daily   multivitamin  1 tablet Oral QHS   sevelamer carbonate  800 mg Oral TID WC   torsemide  40 mg Oral Daily   Elmarie Shiley, MD 05/18/2021, 10:14 AM

## 2021-05-18 NOTE — Progress Notes (Signed)
PROGRESS NOTE    Beverly Martin  [id number] DOB: 1955/11/24 DOA: 04/30/2021 PCP: Billie Ruddy, MD    Brief Narrative:  Beverly Martin is a 65 year old female with past medical history significant for chronic systolic congestive heart failure, type 2 diabetes mellitus, CAD s/p CABG, essential hypertension, hyperlipidemia, CKD stage IV who presents to [facility] ED on 7/29 from PCP office with hyperkalemia and elevated creatinine.  Patient reports shortness of breath, generalized weakness, some chest tightness with increased lower extremity edema over the last week.    Recent TTE March 2022 with LVEF 20-25%.  In the ED, BP 145/74, HR 90, SPO2 within normal limits on 2 L nasal cannula.  Creatinine elevated 4.61 (3.59 on 7/19).  Chest x-ray with left greater than right small pleural effusions with basilar atelectasis.  EKG with normal sinus rhythm.  High sensitive troponin 46.  BNP 4500.  Glucose 238.  Cardiology nephrology were consulted.  TRH consulted for further evaluation management of acute on chronic renal failure with decompensated systolic congestive heart failure.   Assessment & Plan:   Principal Problem:   Hyperkalemia Active Problems:   Essential hypertension   Type 2 diabetes mellitus with hyperlipidemia (HCC)   CAD (coronary artery disease)   Elevated troponin   CKD (chronic kidney disease) stage 4, GFR 15-29 ml/min (HCC)   Chronic systolic heart failure (HCC)   Macrocytic anemia   Pressure injury of skin   AKI (acute kidney injury) ([place])   Acute renal failure on CKD stage IV Patient presenting from PCP office with elevated creatinine.  Creatinine 3.59 on 7/19 now up to 4.61.  Etiology likely secondary to underlying history of hypertension, diabetes and chronic cardiorenal syndrome.  Underwent IR placement of tunneled dialysis catheter on 05/16/2021. --Nephrology following, appreciate assistance --Torsemide 40 mg p.o. daily --Continue HD for volume management, receiving  today --BMP daily  Acute on chronic combined systolic/diastolic congestive heart failure Essential hypertension Patient presenting with increased peripheral edema, elevated BNP 4500.  Etiology likely secondary to progressive renal failure, now HD dependent as above.  Cardiology was initially consulted, signed off on 05/17/2021. --Continue volume management with HD as above --Isosorbide-hydralazine 20-37.5 mg p.o. 3 times daily --Torsemide 40 mg p.o. daily --Continue aspirin and statin --Strict I's and O's and daily weights  CAD s/p CABG: Continue aspirin and statin  Type 2 diabetes mellitus Hemoglobin A1c 7.5 on 05/11/2021, fairly well controlled.  Diet controlled at home. --Sensitive SSI for coverage --CBG qAC/HS    DVT prophylaxis: Place and maintain sequential compression device Start: 05/16/21 1224 SCDs Start: 05/02/2021 1855   Code Status: Full Code Family Communication: No family present at bedside this morning  Disposition Plan:  Level of care: Telemetry Medical Status is: Inpatient  Remains inpatient appropriate because:Ongoing diagnostic testing needed not appropriate for outpatient work up, Unsafe d/c plan, IV treatments appropriate due to intensity of illness or inability to take PO, and Inpatient level of care appropriate due to severity of illness  Dispo: The patient is from: Home              Anticipated d/c is to: Home              Patient currently is not medically stable to d/c.   Difficult to place patient No  Consultants:  Nephrology Cardiology/heart failure service -signed off 8/5  Procedures:  Tunneled HD catheter placement by IR on 8/4  Antimicrobials:  Cefazolin 8/4 - 8/4  Subjective: Patient seen examined at bedside,  resting comfortably.  Currently in HD receiving dialysis.  Tolerating dialysis well.  Reports mild constipation, otherwise no complaints this morning.  Specifically denies headache, no fever/chills/night sweats, no nausea cefonicid  diarrhea, no chest pain, palpitations, no shortness of breath, no abdominal pain, no weakness, no fatigue, no paresthesias.  No acute events overnight per nursing staff.  Objective: Vitals:   05/18/21 1030 05/18/21 1100 05/18/21 1134 05/18/21 1220  BP: (!) 104/52 (!) 103/54 119/60 128/67  Pulse: 93 93 98 93  Resp: (!) 24 (!) '24 20 20  '$ Temp:   (!) 97.5 F (36.4 C) 98.3 F (36.8 C)  TempSrc:   Oral Oral  SpO2: 100% 100% 100% 100%  Weight:   48.7 kg   Height:        Intake/Output Summary (Last 24 hours) at 05/18/2021 1339 Last data filed at 05/18/2021 1114 Gross per 24 hour  Intake 246.9 ml  Output 500 ml  Net -253.1 ml   Filed Weights   05/18/21 0421 05/18/21 0800 05/18/21 1134  Weight: 50 kg 49.2 kg 48.7 kg    Examination:  General exam: Appears calm and comfortable, elderly in appearance, appears older than stated age Respiratory system: Clear to auscultation. Respiratory effort normal.  On room air Cardiovascular system: S1 & S2 heard, RRR. No JVD, murmurs, rubs, gallops or clicks. No pedal edema.  Tunneled HD catheter noted to right chest Gastrointestinal system: Abdomen is nondistended, soft and nontender. No organomegaly or masses felt. Normal bowel sounds heard. Central nervous system: Alert and oriented. No focal neurological deficits. Extremities: Symmetric 5 x 5 power. Skin: No rashes, lesions or ulcers Psychiatry: Judgement and insight appear normal. Mood & affect appropriate.     Data Reviewed: I have personally reviewed following labs and imaging studies  CBC: Recent Labs  Lab 05/16/21 0317 05/18/21 0218  WBC 4.3 3.9*  HGB 7.9* 7.8*  HCT 25.8* 25.7*  MCV 107.1* 108.0*  PLT 65* 60*   Basic Metabolic Panel: Recent Labs  Lab 05/14/21 0754 05/15/21 0152 05/16/21 0317 05/17/21 0258 05/18/21 0218  NA 142 141 138 138 135  K 4.6 4.4 4.1 4.2 4.2  CL 88* 87* 86* 94* 97*  CO2 44* 43* 39* 34* 29  GLUCOSE 204* 139* 158* 182* 111*  BUN 89* 93* 94* 53* 29*   CREATININE 4.02* 4.26* 4.55* 3.04* 2.39*  CALCIUM 9.1 9.0 8.9 8.7* 8.7*  PHOS 6.6* 7.0* 5.2*  --   --    GFR: Estimated Creatinine Clearance: 17.8 mL/min (A) (by C-G formula based on SCr of 2.39 mg/dL (H)). Liver Function Tests: Recent Labs  Lab 05/14/21 0754 05/15/21 0152 05/16/21 0317  ALBUMIN 3.5 3.5 3.3*   No results for input(s): LIPASE, AMYLASE in the last 168 hours. No results for input(s): AMMONIA in the last 168 hours. Coagulation Profile: No results for input(s): INR, PROTIME in the last 168 hours. Cardiac Enzymes: No results for input(s): CKTOTAL, CKMB, CKMBINDEX, TROPONINI in the last 168 hours. BNP (last 3 results) Recent Labs    12/06/20 0927  PROBNP >5000.0*   HbA1C: No results for input(s): HGBA1C in the last 72 hours. CBG: Recent Labs  Lab 05/17/21 1303 05/17/21 1615 05/17/21 2132 05/18/21 0735 05/18/21 1209  GLUCAP 136* 168* 148* 94 143*   Lipid Profile: No results for input(s): CHOL, HDL, LDLCALC, TRIG, CHOLHDL, LDLDIRECT in the last 72 hours. Thyroid Function Tests: No results for input(s): TSH, T4TOTAL, FREET4, T3FREE, THYROIDAB in the last 72 hours. Anemia Panel: Recent Labs  05/17/21 0258  TIBC 217*  IRON 20*   Sepsis Labs: No results for input(s): PROCALCITON, LATICACIDVEN in the last 168 hours.  Recent Results (from the past 240 hour(s))  Resp Panel by RT-PCR (Flu A&B, Covid) Nasopharyngeal Swab     Status: None   Collection Time: 04/17/2021  6:37 PM   Specimen: Nasopharyngeal Swab; Nasopharyngeal(NP) swabs in vial transport medium  Result Value Ref Range Status   SARS Coronavirus 2 by RT PCR NEGATIVE NEGATIVE Final    Comment: (NOTE) SARS-CoV-2 target nucleic acids are NOT DETECTED.  The SARS-CoV-2 RNA is generally detectable in upper respiratory specimens during the acute phase of infection. The lowest concentration of SARS-CoV-2 viral copies this assay can detect is 138 copies/mL. A negative result does not preclude  SARS-Cov-2 infection and should not be used as the sole basis for treatment or other patient management decisions. A negative result may occur with  improper specimen collection/handling, submission of specimen other than nasopharyngeal swab, presence of viral mutation(s) within the areas targeted by this assay, and inadequate number of viral copies(<138 copies/mL). A negative result must be combined with clinical observations, patient history, and epidemiological information. The expected result is Negative.  Fact Sheet for Patients:  EntrepreneurPulse.com.au  Fact Sheet for Healthcare Providers:  IncredibleEmployment.be  This test is no t yet approved or cleared by the [place] FDA and  has been authorized for detection and/or diagnosis of SARS-CoV-2 by FDA under an Emergency Use Authorization (EUA). This EUA will remain  in effect (meaning this test can be used) for the duration of the COVID-19 declaration under Section 564(b)(1) of the Act, 21 U.S.C.section 360bbb-3(b)(1), unless the authorization is terminated  or revoked sooner.       Influenza A by PCR NEGATIVE NEGATIVE Final   Influenza B by PCR NEGATIVE NEGATIVE Final    Comment: (NOTE) The Xpert Xpress SARS-CoV-2/FLU/RSV plus assay is intended as an aid in the diagnosis of influenza from Nasopharyngeal swab specimens and should not be used as a sole basis for treatment. Nasal washings and aspirates are unacceptable for Xpert Xpress SARS-CoV-2/FLU/RSV testing.  Fact Sheet for Patients: EntrepreneurPulse.com.au  Fact Sheet for Healthcare Providers: IncredibleEmployment.be  This test is not yet approved or cleared by the [place] FDA and has been authorized for detection and/or diagnosis of SARS-CoV-2 by FDA under an Emergency Use Authorization (EUA). This EUA will remain in effect (meaning this test can be used) for the duration of  the COVID-19 declaration under Section 564(b)(1) of the Act, 21 U.S.C. section 360bbb-3(b)(1), unless the authorization is terminated or revoked.  Performed at [facility], [place] [address], [place], [place] [address]          Radiology Studies: No results found.      Scheduled Meds:  aspirin  81 mg Oral Daily   atorvastatin  40 mg Oral Daily   calcitRIOL  0.25 mcg Oral Daily   Chlorhexidine Gluconate Cloth  6 each Topical Q0600   darbepoetin (ARANESP) injection - DIALYSIS  60 mcg Intravenous Q Fri-HD   feeding supplement (NEPRO CARB STEADY)  237 mL Oral TID BM   insulin aspart  0-9 Units Subcutaneous TID WC   isosorbide-hydrALAZINE  1 tablet Oral TID   lactulose  10 g Oral Daily   multivitamin  1 tablet Oral QHS   sevelamer carbonate  800 mg Oral TID WC   torsemide  40 mg Oral Daily   Continuous Infusions:  anticoagulant sodium citrate  ferric gluconate (FERRLECIT) IVPB Stopped (05/18/21 1246)     LOS: 7 days    Time spent: 39 minutes spent on chart review, discussion with nursing staff, consultants, updating family and interview/physical exam; more than 50% of that time was spent in counseling and/or coordination of care.    Hyder Deman J [place] DO [facility] Available via Epic secure chat 7am-7pm After these hours, please refer to coverage provider listed on [email or web address] 05/18/2021, 1:39 PM

## 2021-05-18 NOTE — Procedures (Signed)
Patient seen on Hemodialysis. BP (!) 108/55 (BP Location: Right Arm)   Pulse 91 Comment: Simultaneous filing. User may not have seen previous data.  Temp 98.4 F (36.9 C) (Oral)   Resp 17 Comment: Simultaneous filing. User may not have seen previous data.  Ht '5\' 5"'$  (1.651 m)   Wt (P) 49.2 kg   SpO2 100% Comment: Simultaneous filing. User may not have seen previous data.  BMI (P) 18.05 kg/m   QB 350, UF goal 0.5L Tolerating treatment without complaints at this time.   Elmarie Shiley MD [facility]. Office # [phone] Pager # [phone] 10:14 AM

## 2021-05-19 DIAGNOSIS — E875 Hyperkalemia: Secondary | ICD-10-CM | POA: Diagnosis not present

## 2021-05-19 LAB — GLUCOSE, CAPILLARY
Glucose-Capillary: 220 mg/dL — ABNORMAL HIGH (ref 70–99)
Glucose-Capillary: 202 mg/dL — ABNORMAL HIGH (ref 70–99)
Glucose-Capillary: 229 mg/dL — ABNORMAL HIGH (ref 70–99)
Glucose-Capillary: 208 mg/dL — ABNORMAL HIGH (ref 70–99)

## 2021-05-19 MED ORDER — SODIUM CHLORIDE 0.9 % IV BOLUS
500.0000 mL | Freq: Once | INTRAVENOUS | Status: AC
  Administered 2021-05-19: 500 mL via INTRAVENOUS

## 2021-05-19 MED ORDER — MIDODRINE HCL 5 MG PO TABS
5.0000 mg | ORAL_TABLET | Freq: Three times a day (TID) | ORAL | Status: DC
  Administered 2021-05-19 – 2021-05-22 (×9): 5 mg via ORAL
  Filled 2021-05-19 (×9): qty 1

## 2021-05-19 MED ORDER — ALBUMIN HUMAN 25 % IV SOLN
25.0000 g | Freq: Once | INTRAVENOUS | Status: AC
  Administered 2021-05-19: 25 g via INTRAVENOUS
  Filled 2021-05-19: qty 100

## 2021-05-19 NOTE — Progress Notes (Signed)
Patient ID: Beverly Martin, female   DOB: 03/29/1956, 65 y.o.   MRN: [id number] [facility] Progress Note   Assessment/ Plan:   1. Acute kidney Injury on chronic kidney disease stage IV-likely progressed to ESRD: With history of underlying hypertension, diabetes and chronic cardiorenal syndrome with worsening renal function/diuretic resistance prompting need to start dialysis for uremia/volume.  No urine output charted and I will discontinue torsemide at this time.  She is currently getting hemodialysis via [facility] with plans for permanent access deferred until we confirm her ability to tolerate hemodialysis.  Because of her dyspnea, I will order for dialysis again tomorrow. 2.  Acute exacerbation of combined diastolic/systolic congestive heart failure: Poor response to diuretic treatment earlier and now on hemodialysis for volume/management of azotemia with improving exam.  No urine output charted, discontinue torsemide. 3.  Metabolic alkalosis: Secondary to diuresis/contraction-improving with hemodialysis and will discontinue torsemide. 4.  Anemia of chronic illness/chronic kidney disease: Significant iron deficiency seen on labs; Now on intravenous iron and ESA. 5.  Chronic kidney disease/metabolic bone disease: Phosphorus levels continue to improve with ongoing hemodialysis and low-dose sevelamer 3 times daily AC.  Continue calcitriol for PTH control.  Subjective:   Reports to had some shortness of breath earlier this morning improving partially with oxygen via nasal cannula.    Objective:   BP (!) 97/48 (BP Location: Left Arm)   Pulse 92   Temp 98.3 F (36.8 C) (Oral)   Resp 20   Ht '5\' 5"'$  (1.651 m)   Wt 48.7 kg   SpO2 100%   BMI 17.87 kg/m   Intake/Output Summary (Last 24 hours) at 05/19/2021 1014 Last data filed at 05/18/2021 1800 Gross per 24 hour  Intake 480 ml  Output 500 ml  Net -20 ml   Weight change: -1.1 kg  Physical Exam: Gen: Appears fatigued, resting in bed.   On oxygen via nasal cannula CVS: Pulse regular rhythm, normal rate, S1 and S2 normal Resp: Poor inspiratory effort with decreased breath sounds over bases, no distinct rales or rhonchi. RIJ TDC Abd: Soft, flat, nontender, bowel sounds normal Ext: Trace lower extremity edema  Imaging: DG CHEST PORT 1 VIEW  Result Date: 05/18/2021 CLINICAL DATA:  Shortness of breath EXAM: PORTABLE CHEST 1 VIEW COMPARISON:  05/11/2021 FINDINGS: Cardiac shadow remains enlarged. Postsurgical changes are noted. New right-sided jugular dialysis catheter is seen in satisfactory position. Chronic blunting of the costophrenic angles is again noted and stable. Mild central vascular congestion is seen. No acute infiltrate is noted. No bony abnormality is seen. IMPRESSION: Mild vascular congestion. Chronic blunting of the costophrenic angle stable from previous exam. Electronically Signed   By: Inez Catalina M.D.   On: 05/18/2021 20:54    Labs: BMET Recent Labs  Lab 05/12/21 1846 05/13/21 [id number] 05/14/21 0754 05/15/21 0152 05/16/21 0317 05/17/21 0258 05/18/21 0218  NA 142 143 142 141 138 138 135  K 4.1 4.3 4.6 4.4 4.1 4.2 4.2  CL 90* 90* 88* 87* 86* 94* 97*  CO2 43* 44* 44* 43* 39* 34* 29  GLUCOSE 101* 165* 204* 139* 158* 182* 111*  BUN 93* 89* 89* 93* 94* 53* 29*  CREATININE 3.87* 3.94* 4.02* 4.26* 4.55* 3.04* 2.39*  CALCIUM 9.4 9.2 9.1 9.0 8.9 8.7* 8.7*  PHOS  --   --  6.6* 7.0* 5.2*  --   --    CBC Recent Labs  Lab 05/16/21 0317 05/18/21 0218  WBC 4.3 3.9*  HGB 7.9* 7.8*  HCT  25.8* 25.7*  MCV 107.1* 108.0*  PLT 65* 60*    Medications:     aspirin  81 mg Oral Daily   atorvastatin  40 mg Oral Daily   calcitRIOL  0.25 mcg Oral Daily   Chlorhexidine Gluconate Cloth  6 each Topical Q0600   darbepoetin (ARANESP) injection - DIALYSIS  60 mcg Intravenous Q Fri-HD   feeding supplement (NEPRO CARB STEADY)  237 mL Oral TID BM   insulin aspart  0-9 Units Subcutaneous TID WC   isosorbide-hydrALAZINE  1  tablet Oral TID   lactulose  10 g Oral Daily   multivitamin  1 tablet Oral QHS   polyethylene glycol  17 g Oral Daily   sevelamer carbonate  800 mg Oral TID WC   torsemide  40 mg Oral Daily   Elmarie Shiley, MD 05/19/2021, 10:14 AM

## 2021-05-19 NOTE — Progress Notes (Signed)
RN called to notify RRT regarding pt current vital signs. RN notified MD and new orders received. RN confirmed no acute needs from RRT at this time. Please notify with RRT as needed. Continue vital signs per MEWS protocol.   Vaiden Adames Rapid Response RN

## 2021-05-19 NOTE — Progress Notes (Addendum)
PROGRESS NOTE    Beverly Martin  [id number] DOB: [birth year] DOA: 05/08/2021 PCP: Billie Ruddy, MD    Brief Narrative:  Beverly Martin is a 65 year old female with past medical history significant for chronic systolic congestive heart failure, type 2 diabetes mellitus, CAD s/p CABG, essential hypertension, hyperlipidemia, CKD stage IV who presents to [facility] ED on 7/29 from PCP office with hyperkalemia and elevated creatinine.  Patient reports shortness of breath, generalized weakness, some chest tightness with increased lower extremity edema over the last week.    Recent TTE March 2022 with LVEF 20-25%.  In the ED, BP 145/74, HR 90, SPO2 within normal limits on 2 L nasal cannula.  Creatinine elevated 4.61 (3.59 on 7/19).  Chest x-ray with left greater than right small pleural effusions with basilar atelectasis.  EKG with normal sinus rhythm.  High sensitive troponin 46.  BNP 4500.  Glucose 238.  Cardiology nephrology were consulted.  TRH consulted for further evaluation management of acute on chronic renal failure with decompensated systolic congestive heart failure.   Assessment & Plan:   Principal Problem:   Hyperkalemia Active Problems:   Essential hypertension   Type 2 diabetes mellitus with hyperlipidemia (HCC)   CAD (coronary artery disease)   Elevated troponin   CKD (chronic kidney disease) stage 4, GFR 15-29 ml/min (HCC)   Chronic systolic heart failure (HCC)   Macrocytic anemia   Pressure injury of skin   AKI (acute kidney injury) ([place])   Acute renal failure on CKD stage IV Patient presenting from PCP office with elevated creatinine.  Creatinine 3.59 on 7/19 now up to 4.61.  Etiology likely secondary to underlying history of hypertension, diabetes and chronic cardiorenal syndrome.  Underwent IR placement of tunneled dialysis catheter on 05/16/2021. --Nephrology following, appreciate assistance --Continue HD for volume management --BMP daily  Acute on chronic combined  systolic/diastolic congestive heart failure Essential hypertension now with hypotension Patient presenting with increased peripheral edema, elevated BNP 4500.  Etiology likely secondary to progressive renal failure, now HD dependent as above.  Cardiology was initially consulted, signed off on 05/17/2021. --Continue volume management with HD as above --Discontinue isosorbide-hydralazine and torsemide due to hypotension --NS 500 mL bolus, albumin 25 g IV x1 today --Start midodrine 5 mg p.o. 3 times daily --Continue aspirin and statin --Strict I's and O's and daily weights --Continue monitor BP closely  CAD s/p CABG: Continue aspirin and statin  Type 2 diabetes mellitus Hemoglobin A1c 7.5 on 05/11/2021, fairly well controlled.  Diet controlled at home. --Sensitive SSI for coverage --CBG qAC/HS  Goals of care: Patient with continued decline, now hypotensive today; discontinued of isosorbide-hydralazine and torsemide.  Given IV fluid bolus and albumin today.  Also starting on midodrine.  Has been seen by palliative care on 8/3, with desires to continue full scope of care.  Attempted to update patient's husband via telephone this morning, unsuccessful x2.  Concerned that she is going to continue to decline and not survive this hospitalization given her significant comorbidities with congestive heart failure combined with her renal insufficiency.  Patient is critically ill with poor prognosis.    DVT prophylaxis: Place and maintain sequential compression device Start: 05/16/21 1224 SCDs Start: 05/07/2021 1855   Code Status: Full Code Family Communication: No family present at bedside this morning  Disposition Plan:  Level of care: Telemetry Medical Status is: Inpatient  Remains inpatient appropriate because:Ongoing diagnostic testing needed not appropriate for outpatient work up, Unsafe d/c plan, IV treatments appropriate due to  intensity of illness or inability to take PO, and Inpatient level of  care appropriate due to severity of illness  Dispo: The patient is from: Home              Anticipated d/c is to: Home              Patient currently is not medically stable to d/c.   Difficult to place patient No  Consultants:  Nephrology Cardiology/heart failure service -signed off 8/5  Procedures:  Tunneled HD catheter placement by IR on 8/4  Antimicrobials:  Cefazolin 8/4 - 8/4  Subjective: Patient seen examined at bedside, resting comfortably.  Continues to be more confused, less verbal today.  Blood pressures decreasing.  No family present at bedside attempted to call patient's husband x2 without success.  Given her comorbidities of systolic heart failure, suspect patient may not survive hospitalization given now reliance on HD which she likely will not continue to tolerate.  Now hypotensive this morning, received IV fluid bolus followed by IV albumin and started on midodrine.  Discontinue torsemide/isosorbide mononitrate-hydralazine.  Overall grim/poor prognosis.  Has been seen by palliative care on 8/3 with wishes for full scope of care at this point.  Objective: Vitals:   05/19/21 0845 05/19/21 1200 05/19/21 1300 05/19/21 1317  BP: (!) 97/48 (!) 81/46 (!) 87/45 (!) 86/47  Pulse: 92  81 84  Resp: 20 (!) 24 (!) 22 20  Temp:  (!) 97.3 F (36.3 C)    TempSrc: Oral     SpO2: 100%  100% 100%  Weight:      Height:        Intake/Output Summary (Last 24 hours) at 05/19/2021 1334 Last data filed at 05/18/2021 1800 Gross per 24 hour  Intake 120 ml  Output --  Net 120 ml   Filed Weights   05/18/21 0421 05/18/21 0800 05/18/21 1134  Weight: 50 kg 49.2 kg 48.7 kg    Examination:  General exam: Appears calm and comfortable, elderly in appearance, appears older than stated age, chronically ill in appearance Respiratory system: Clear to auscultation. Respiratory effort normal.  On room air Cardiovascular system: S1 & S2 heard, RRR. No JVD, murmurs, rubs, gallops or clicks. No  pedal edema.  Tunneled HD catheter noted to right chest Gastrointestinal system: Abdomen is nondistended, soft and nontender. No organomegaly or masses felt. Normal bowel sounds heard. Central nervous system: Alert . No focal neurological deficits. Extremities: Symmetric 5 x 5 power. Skin: No rashes, lesions or ulcers Psychiatry: Judgement and insight appear normal. Mood & affect appropriate.     Data Reviewed: I have personally reviewed following labs and imaging studies  CBC: Recent Labs  Lab 05/16/21 0317 05/18/21 0218  WBC 4.3 3.9*  HGB 7.9* 7.8*  HCT 25.8* 25.7*  MCV 107.1* 108.0*  PLT 65* 60*   Basic Metabolic Panel: Recent Labs  Lab 05/14/21 0754 05/15/21 0152 05/16/21 0317 05/17/21 0258 05/18/21 0218  NA 142 141 138 138 135  K 4.6 4.4 4.1 4.2 4.2  CL 88* 87* 86* 94* 97*  CO2 44* 43* 39* 34* 29  GLUCOSE 204* 139* 158* 182* 111*  BUN 89* 93* 94* 53* 29*  CREATININE 4.02* 4.26* 4.55* 3.04* 2.39*  CALCIUM 9.1 9.0 8.9 8.7* 8.7*  PHOS 6.6* 7.0* 5.2*  --   --    GFR: Estimated Creatinine Clearance: 17.8 mL/min (A) (by C-G formula based on SCr of 2.39 mg/dL (H)). Liver Function Tests: Recent Labs  Lab 05/14/21 0754 05/15/21  [id number] 05/16/21 0317  ALBUMIN 3.5 3.5 3.3*   No results for input(s): LIPASE, AMYLASE in the last 168 hours. No results for input(s): AMMONIA in the last 168 hours. Coagulation Profile: No results for input(s): INR, PROTIME in the last 168 hours. Cardiac Enzymes: No results for input(s): CKTOTAL, CKMB, CKMBINDEX, TROPONINI in the last 168 hours. BNP (last 3 results) Recent Labs    12/06/20 0927  PROBNP >5000.0*   HbA1C: No results for input(s): HGBA1C in the last 72 hours. CBG: Recent Labs  Lab 05/18/21 0735 05/18/21 1209 05/18/21 1854 05/19/21 0746 05/19/21 1139  GLUCAP 94 143* 180* 220* 202*   Lipid Profile: No results for input(s): CHOL, HDL, LDLCALC, TRIG, CHOLHDL, LDLDIRECT in the last 72 hours. Thyroid Function  Tests: No results for input(s): TSH, T4TOTAL, FREET4, T3FREE, THYROIDAB in the last 72 hours. Anemia Panel: Recent Labs    05/17/21 0258  TIBC 217*  IRON 20*   Sepsis Labs: No results for input(s): PROCALCITON, LATICACIDVEN in the last 168 hours.  Recent Results (from the past 240 hour(s))  Resp Panel by RT-PCR (Flu A&B, Covid) Nasopharyngeal Swab     Status: None   Collection Time: 05/11/2021  6:37 PM   Specimen: Nasopharyngeal Swab; Nasopharyngeal(NP) swabs in vial transport medium  Result Value Ref Range Status   SARS Coronavirus 2 by RT PCR NEGATIVE NEGATIVE Final    Comment: (NOTE) SARS-CoV-2 target nucleic acids are NOT DETECTED.  The SARS-CoV-2 RNA is generally detectable in upper respiratory specimens during the acute phase of infection. The lowest concentration of SARS-CoV-2 viral copies this assay can detect is 138 copies/mL. A negative result does not preclude SARS-Cov-2 infection and should not be used as the sole basis for treatment or other patient management decisions. A negative result may occur with  improper specimen collection/handling, submission of specimen other than nasopharyngeal swab, presence of viral mutation(s) within the areas targeted by this assay, and inadequate number of viral copies(<138 copies/mL). A negative result must be combined with clinical observations, patient history, and epidemiological information. The expected result is Negative.  Fact Sheet for Patients:  EntrepreneurPulse.com.au  Fact Sheet for Healthcare Providers:  IncredibleEmployment.be  This test is no t yet approved or cleared by the [place] FDA and  has been authorized for detection and/or diagnosis of SARS-CoV-2 by FDA under an Emergency Use Authorization (EUA). This EUA will remain  in effect (meaning this test can be used) for the duration of the COVID-19 declaration under Section 564(b)(1) of the Act, 21 U.S.C.section  360bbb-3(b)(1), unless the authorization is terminated  or revoked sooner.       Influenza A by PCR NEGATIVE NEGATIVE Final   Influenza B by PCR NEGATIVE NEGATIVE Final    Comment: (NOTE) The Xpert Xpress SARS-CoV-2/FLU/RSV plus assay is intended as an aid in the diagnosis of influenza from Nasopharyngeal swab specimens and should not be used as a sole basis for treatment. Nasal washings and aspirates are unacceptable for Xpert Xpress SARS-CoV-2/FLU/RSV testing.  Fact Sheet for Patients: EntrepreneurPulse.com.au  Fact Sheet for Healthcare Providers: IncredibleEmployment.be  This test is not yet approved or cleared by the [place] FDA and has been authorized for detection and/or diagnosis of SARS-CoV-2 by FDA under an Emergency Use Authorization (EUA). This EUA will remain in effect (meaning this test can be used) for the duration of the COVID-19 declaration under Section 564(b)(1) of the Act, 21 U.S.C. section 360bbb-3(b)(1), unless the authorization is terminated or revoked.  Performed at [facility]  Lab, [address], [place], [place] [address]          Radiology Studies: DG CHEST PORT 1 VIEW  Result Date: 05/18/2021 CLINICAL DATA:  Shortness of breath EXAM: PORTABLE CHEST 1 VIEW COMPARISON:  04/23/2021 FINDINGS: Cardiac shadow remains enlarged. Postsurgical changes are noted. New right-sided jugular dialysis catheter is seen in satisfactory position. Chronic blunting of the costophrenic angles is again noted and stable. Mild central vascular congestion is seen. No acute infiltrate is noted. No bony abnormality is seen. IMPRESSION: Mild vascular congestion. Chronic blunting of the costophrenic angle stable from previous exam. Electronically Signed   By: Inez Catalina M.D.   On: 05/18/2021 20:54        Scheduled Meds:  aspirin  81 mg Oral Daily   atorvastatin  40 mg Oral Daily   calcitRIOL  0.25 mcg Oral Daily    Chlorhexidine Gluconate Cloth  6 each Topical Q0600   darbepoetin (ARANESP) injection - DIALYSIS  60 mcg Intravenous Q Fri-HD   feeding supplement (NEPRO CARB STEADY)  237 mL Oral TID BM   insulin aspart  0-9 Units Subcutaneous TID WC   lactulose  10 g Oral Daily   midodrine  5 mg Oral TID WC   multivitamin  1 tablet Oral QHS   polyethylene glycol  17 g Oral Daily   sevelamer carbonate  800 mg Oral TID WC   Continuous Infusions:  albumin human     anticoagulant sodium citrate     ferric gluconate (FERRLECIT) IVPB 250 mg (05/19/21 0851)     LOS: 8 days    Time spent: 39 minutes spent on chart review, discussion with nursing staff, consultants, updating family and interview/physical exam; more than 50% of that time was spent in counseling and/or coordination of care.    Ambri Miltner J [place] DO [facility] Available via Epic secure chat 7am-7pm After these hours, please refer to coverage provider listed on [email or web address] 05/19/2021, 1:34 PM

## 2021-05-19 NOTE — Progress Notes (Signed)
   05/19/21 1200  Assess: MEWS Score  Temp (!) 97.3 F (36.3 C)  BP (!) 81/46  ECG Heart Rate 81  Resp (!) 24  O2 Device Nasal Cannula  O2 Flow Rate (L/min) 2 L/min  Assess: MEWS Score  MEWS Temp 0  MEWS Systolic 1  MEWS Pulse 0  MEWS RR 1  MEWS LOC 0  MEWS Score 2  MEWS Score Color Yellow  Assess: if the MEWS score is Yellow or Red  Were vital signs taken at a resting state? Yes  Focused Assessment Change from prior assessment (see assessment flowsheet)  Early Detection of Sepsis Score *See Row Information* Low  MEWS guidelines implemented *See Row Information* Yes  Treat  MEWS Interventions Administered prn meds/treatments  Pain Scale 0-10  Pain Score Asleep  Take Vital Signs  Increase Vital Sign Frequency  Yellow: Q 2hr X 2 then Q 4hr X 2, if remains yellow, continue Q 4hrs  Escalate  MEWS: Escalate Yellow: discuss with charge nurse/RN and consider discussing with provider and RRT  Notify: Charge Nurse/RN  Name of Charge Nurse/RN Notified Fraser Din RN  Date Charge Nurse/RN Notified 05/19/21  Time Charge Nurse/RN Notified [id number]  Notify: Provider  Provider Name/Title [place] Eric  Date Provider Notified 05/19/21  Time Provider Notified 1235  Notification Type  (secure chat)  Notification Reason Change in status  Provider response See new orders  Date of Provider Response 05/19/21  Time of Provider Response 1235  Notify: Rapid Response  Date Rapid Response Notified 05/19/21  Time Rapid Response Notified 1310  Document  Patient Outcome Not stable and remains on department  Progress note created (see row info) Yes    In contact with MD [place] pt asleep and more stabilized at this time. BP 88/47, going to also give PO midodrine and IV Albumin. Palliative consult scheduled for tomorrow, MD attempting to get in touch with husband to discuss goals of care and code status. Will continue to monitor closely.

## 2021-05-20 DIAGNOSIS — R5383 Other fatigue: Secondary | ICD-10-CM

## 2021-05-20 DIAGNOSIS — R4182 Altered mental status, unspecified: Secondary | ICD-10-CM

## 2021-05-20 DIAGNOSIS — Z515 Encounter for palliative care: Secondary | ICD-10-CM | POA: Diagnosis not present

## 2021-05-20 DIAGNOSIS — N184 Chronic kidney disease, stage 4 (severe): Secondary | ICD-10-CM | POA: Diagnosis not present

## 2021-05-20 DIAGNOSIS — E875 Hyperkalemia: Secondary | ICD-10-CM | POA: Diagnosis not present

## 2021-05-20 LAB — BASIC METABOLIC PANEL
Anion gap: 6 (ref 5–15)
BUN: 24 mg/dL — ABNORMAL HIGH (ref 8–23)
CO2: 30 mmol/L (ref 22–32)
Calcium: 9.1 mg/dL (ref 8.9–10.3)
Chloride: 99 mmol/L (ref 98–111)
Creatinine, Ser: 3.56 mg/dL — ABNORMAL HIGH (ref 0.44–1.00)
GFR, Estimated: 14 mL/min — ABNORMAL LOW (ref >60)
Glucose, Bld: 188 mg/dL — ABNORMAL HIGH (ref 70–99)
Potassium: 4.5 mmol/L (ref 3.5–5.1)
Sodium: 135 mmol/L (ref 135–145)

## 2021-05-20 LAB — CBC
HCT: 28.4 % — ABNORMAL LOW (ref 36.0–46.0)
Hemoglobin: 8.5 g/dL — ABNORMAL LOW (ref 12.0–15.0)
MCH: 32.7 pg (ref 26.0–34.0)
MCHC: 29.9 g/dL — ABNORMAL LOW (ref 30.0–36.0)
MCV: 109.2 fL — ABNORMAL HIGH (ref 80.0–100.0)
Platelets: 140 10*3/uL — ABNORMAL LOW (ref 150–400)
RBC: 2.6 MIL/uL — ABNORMAL LOW (ref 3.87–5.11)
RDW: 13.9 % (ref 11.5–15.5)
WBC: 5.8 10*3/uL (ref 4.0–10.5)
nRBC: 0 % (ref 0.0–0.2)

## 2021-05-20 LAB — GLUCOSE, CAPILLARY
Glucose-Capillary: 188 mg/dL — ABNORMAL HIGH (ref 70–99)
Glucose-Capillary: 158 mg/dL — ABNORMAL HIGH (ref 70–99)
Glucose-Capillary: 110 mg/dL — ABNORMAL HIGH (ref 70–99)

## 2021-05-20 LAB — MAGNESIUM: Magnesium: 2.2 mg/dL (ref 1.7–2.4)

## 2021-05-20 MED ORDER — HEPARIN SODIUM (PORCINE) 1000 UNIT/ML IJ SOLN
INTRAMUSCULAR | Status: AC
  Filled 2021-05-20: qty 4

## 2021-05-20 MED ORDER — INSULIN DETEMIR 100 UNIT/ML ~~LOC~~ SOLN
6.0000 [IU] | Freq: Every day | SUBCUTANEOUS | Status: DC
  Administered 2021-05-20 – 2021-05-22 (×3): 6 [IU] via SUBCUTANEOUS
  Filled 2021-05-20 (×3): qty 0.06

## 2021-05-20 NOTE — Progress Notes (Signed)
Palliative Care Progress Note, Assessment & Plan   Patient Name: Beverly Martin       Date: 05/20/2021 DOB: Jan 08, 1956  Age: 65 y.o. MRN#: [id number] Attending Physician: [place] Eric J, DO Primary Care Physician: Billie Ruddy, MD Admit Date: 04/28/2021  Reason for Consultation/Follow-up: Establishing goals of care  HPI:  Beverly Martin is a 65 y.o. female admitted on 04/18/2021 with a past  medical history significant for systolic heart failure, diabetes type 2, coronary disease status post CABG, hypertension, hyperlipidemia, and CKD that presented to the ED from PCP office for evaluation of hyperkalemia and elevated creatinine.    Patient had echo in March showing EF of 20 to 25%, down from echo 04/25/2021 with EF of 30 to 35%.   Today is day 10 of this hospitalization. Pt continues to have worsening renal function.  She completed dialysis 8/4, 8/5, and 8/6. Her medical team, including nephrology and cardiology, are concerned for patient's ability to tolerate hemodialysis.    Patient has had continued decline over the weekend, she is more lethargic and with minimal interaction with her family at the bedside today.     Patient and family face treatment option decisions, advanced directive decisions and anticipatory care needs. Currently, patient is a full code.   Subjective:  Pt is lethargic and barely verbalizing. Her gaze is off in the distance and does not make effort to make contact. She appears pale and weak. Her respirations are shallow but regular.  Plan of Care:  This NP Wadie Lessen reviewed medical records, received report from team, assessed the patient and then meet at the patient's bedside along with her husband [name] and sister [name] to discuss diagnosis, prognosis, GOC, EOL wishes  disposition and options.  The concept of palliative care was reiterated to the husband [name] as a specialized medical care for people and their families living with serious illness.  We reviewed that it focuses on providing relief from the symptoms and stress of a serious illness.  I again educated the patient's husband that the goal is to improve quality of life for both the patient and the family.  I created space and opportunity for the patient's husband and patient's sister to explore their thoughts and feelings regarding the patient's current medical situation. The patient was too lethargic to participate in our discussion. However, she did verbalize she saw her (deceased) mother.   [name], the patient's husband, agreed that the patient should be a DNR to avoid increased suffering and undue pain. He was in agreement to keep pushing forward with dialysis, but that should her heart stop he would not want to pursue resuscitation, intubation, or shock. He expressed that the patient would not want to be on machines, just laying in a bed.  [name] was tearful during out discussion but confirmed understanding that the patient's code status is changed to a DNR. Sister at beside was supportive of [name]'s decision.   Education offered on the limitations of medical interventions to prolong life when the body fails to thrive.  Questions and concerns addressed  Code Status: DNR-documented today  Prognosis:  Unable to determine  Discharge Planning: To Be Determined  Recommendations/Plan: As per husband and  sister, plan is to continue with HD tomorrow if patient can tolerate   Care plan was discussed with patient's husband and sister  Length of Stay: 10  Physical Exam Constitutional:      Comments: Not alert, gaze is fixed  HENT:     Head: Normocephalic and atraumatic.  Cardiovascular:     Rate and Rhythm: Normal rate and regular rhythm.  Pulmonary:     Effort: Pulmonary effort is  normal.  Abdominal:     Palpations: Abdomen is soft.  Musculoskeletal:     Right lower leg: Edema present.     Left lower leg: Edema present.  Skin:    General: Skin is warm and dry.  Neurological:     Motor: Weakness present.  Psychiatric:     Comments: Pt barely verbal - spoke of seeing her deceased mother            Vital Signs: BP (!) 109/50 (BP Location: Right Arm)   Pulse 85   Temp 98.1 F (36.7 C) (Oral)   Resp 16   Ht '5\' 5"'$  (1.651 m)   Wt 50.4 kg   SpO2 100%   BMI 18.49 kg/m  SpO2: SpO2: 100 % O2 Device: O2 Device: Nasal Cannula O2 Flow Rate: O2 Flow Rate (L/min): 2 L/min      Palliative Assessment/Data: 40%       Total Time 45 minutes Prolonged Time Billed  no   Greater than 50%  of this time was spent counseling and coordinating care related to the above assessment and plan.  Thank you for allowing the Palliative Medicine Team to assist in the care of this patient.  Wadie Lessen NP Palliative Medicine Team Team Phone # [phone]

## 2021-05-20 NOTE — Progress Notes (Signed)
Chaplain responded to call from pt's nurse to minister to pt's husband who is tearful.  Chaplain and husband talked at bedside, shared a scripture reading and prayed together.  Husband began telling loving stories of their 31-year marriage.  Husband is ready for his wife to go to the [place] if it His will.  He has a church family who will support him.  Together we laid hands on his wife and blessed her.  Chaplain advised husband she is here throughout the night if would like additional support.    [place]

## 2021-05-20 NOTE — Progress Notes (Signed)
Inpatient Diabetes Program Recommendations  AACE/ADA: New Consensus Statement on Inpatient Glycemic Control (2015)  Target Ranges:  Prepandial:   less than 140 mg/dL      Peak postprandial:   less than 180 mg/dL (1-2 hours)      Critically ill patients:  140 - 180 mg/dL   Lab Results  Component Value Date   GLUCAP 188 (H) 05/20/2021   HGBA1C 7.5 (H) 05/11/2021    Review of Glycemic Control Results for ALIS, SIEMENS (MRN [id number]) as of 05/20/2021 10:48  Ref. Range 05/19/2021 11:39 05/19/2021 16:53 05/19/2021 21:12 05/20/2021 07:31  Glucose-Capillary Latest Ref Range: 70 - 99 mg/dL 202 (H) 229 (H) 208 (H) 188 (H)   Diabetes history: Type 2 DM Outpatient Diabetes medications: none Current orders for Inpatient glycemic control: Novolog 0-9 units TID  Inpatient Diabetes Program Recommendations:    If appropriate, consider adding Levemir 6 units QD.   Thanks, Bronson Curb, MSN, RNC-OB Diabetes Coordinator [phone] (8a-5p)

## 2021-05-20 NOTE — Progress Notes (Signed)
PT Cancellation Note  Patient Details Name: Beverly Martin MRN: [id number] DOB: [birth year]   Cancelled Treatment:    Reason Eval/Treat Not Completed: Patient at procedure or test/unavailable. Pt at HD. Pt has initiated HD since she was last seen by PT. May need updated dc recommendations now that she has declined and is on HD. Will assess when next seen.   Shary Decamp [facility] 05/20/2021, 3:53 PM [place] Pager [phone] Office [phone]

## 2021-05-20 NOTE — Progress Notes (Signed)
Patient ID: Beverly Martin, female   DOB: 03/05/1956, 65 y.o.   MRN: [id number] [facility] Progress Note   Assessment/ Plan:   1. Acute kidney Injury on chronic kidney disease stage IV-likely progressed to ESRD: With history of underlying hypertension, diabetes and chronic cardiorenal syndrome with worsening renal function/diuretic resistance prompting need to start dialysis for uremia/volume.  Torsemide stopped as no urine output.   - HD today for shortness of breath noted over weekend.  Assess HD needs daily.  Tentatively a MWF schedule for now this week  - She is currently getting hemodialysis via [facility] with plans for permanent access deferred until we confirm her ability to tolerate hemodialysis.    2.  Acute exacerbation of combined diastolic/systolic congestive heart failure: Poor response to diuretic treatment earlier and now on hemodialysis for volume/management of azotemia with improving exam  3.  Metabolic alkalosis: Secondary to diuresis/contraction-improving with hemodialysis   4.  Anemia of chronic illness/chronic kidney disease: Significant iron deficiency seen on labs; Now on intravenous iron/ferrlecit and on aranesp 60 mcg every friday. improving  5.  Chronic kidney disease/metabolic bone disease: phos level in AM.  Have improved with HD and sevelamer 3 times daily AC.  Continue calcitriol for PTH control while here.  Subjective:   no urine output is charted over 8/7.  Last HD on 8/6 with 0.5 kg UF.  Her brother is at bedside and the patient wasn't aware he was here.  Spoke with her nurse and reviewed charting - she has been confused this hospitalization.  About the same today it seems but not her baseline per RN who states he also saw her several months ago.   Review of systems:  Denies shortness of breath today (had reported yesterday) Denies chest pain Denies n/v     Objective:   BP (!) 103/50 (BP Location: Right Arm)   Pulse 85   Temp 98.1 F (36.7 C)  (Oral)   Resp 15   Ht '5\' 5"'$  (1.651 m)   Wt 50.4 kg   SpO2 100%   BMI 18.49 kg/m   Intake/Output Summary (Last 24 hours) at 05/20/2021 1101 Last data filed at 05/19/2021 2130 Gross per 24 hour  Intake 480 ml  Output --  Net 480 ml   Weight change: 1.195 kg  Physical Exam:  General elderly female in bed in no acute distress HEENT normocephalic atraumatic extraocular movements intact sclera anicteric Neck supple trachea midline Lungs clear to auscultation bilaterally normal work of breathing at rest on 2 liters oxygen Heart S1S2 no rub Abdomen soft nontender nondistended Extremities no edema  Psych normal mood and affect Neuro awake and eyes open.  Not oriented to year (stare/no answer).  Answers review of systems questions and does not volunteer other information.  Access RIJ tunn catheter    Imaging: DG CHEST PORT 1 VIEW  Result Date: 05/18/2021 CLINICAL DATA:  Shortness of breath EXAM: PORTABLE CHEST 1 VIEW COMPARISON:  04/28/2021 FINDINGS: Cardiac shadow remains enlarged. Postsurgical changes are noted. New right-sided jugular dialysis catheter is seen in satisfactory position. Chronic blunting of the costophrenic angles is again noted and stable. Mild central vascular congestion is seen. No acute infiltrate is noted. No bony abnormality is seen. IMPRESSION: Mild vascular congestion. Chronic blunting of the costophrenic angle stable from previous exam. Electronically Signed   By: Inez Catalina M.D.   On: 05/18/2021 20:54    Labs: BMET Recent Labs  Lab 05/14/21 [id number] 05/15/21 0152 05/16/21 0317 05/17/21 0258  05/18/21 0218 05/20/21 0051  NA 142 141 138 138 135 135  K 4.6 4.4 4.1 4.2 4.2 4.5  CL 88* 87* 86* 94* 97* 99  CO2 44* 43* 39* 34* 29 30  GLUCOSE 204* 139* 158* 182* 111* 188*  BUN 89* 93* 94* 53* 29* 24*  CREATININE 4.02* 4.26* 4.55* 3.04* 2.39* 3.56*  CALCIUM 9.1 9.0 8.9 8.7* 8.7* 9.1  PHOS 6.6* 7.0* 5.2*  --   --   --    CBC Recent Labs  Lab 05/16/21 0317  05/18/21 0218 05/20/21 0051  WBC 4.3 3.9* 5.8  HGB 7.9* 7.8* 8.5*  HCT 25.8* 25.7* 28.4*  MCV 107.1* 108.0* 109.2*  PLT 65* 60* 140*    Medications:     aspirin  81 mg Oral Daily   atorvastatin  40 mg Oral Daily   calcitRIOL  0.25 mcg Oral Daily   Chlorhexidine Gluconate Cloth  6 each Topical Q0600   darbepoetin (ARANESP) injection - DIALYSIS  60 mcg Intravenous Q Fri-HD   feeding supplement (NEPRO CARB STEADY)  237 mL Oral TID BM   insulin aspart  0-9 Units Subcutaneous TID WC   lactulose  10 g Oral Daily   midodrine  5 mg Oral TID WC   multivitamin  1 tablet Oral QHS   polyethylene glycol  17 g Oral Daily   sevelamer carbonate  800 mg Oral TID WC   Claudia Desanctis, MD 05/20/2021, 11:22 AM

## 2021-05-20 NOTE — Progress Notes (Signed)
PROGRESS NOTE    [name]  [id number] DOB: July 12, 1956 DOA: 05/07/2021 PCP: Billie Ruddy, MD    Brief Narrative:  Beverly Martin is a 65 year old female with past medical history significant for chronic systolic congestive heart failure, type 2 diabetes mellitus, CAD s/p CABG, essential hypertension, hyperlipidemia, CKD stage IV who presents to [facility] ED on 7/29 from PCP office with hyperkalemia and elevated creatinine.  Patient reports shortness of breath, generalized weakness, some chest tightness with increased lower extremity edema over the last week.    Recent TTE March 2022 with LVEF 20-25%.  In the ED, BP 145/74, HR 90, SPO2 within normal limits on 2 L nasal cannula.  Creatinine elevated 4.61 (3.59 on 7/19).  Chest x-ray with left greater than right small pleural effusions with basilar atelectasis.  EKG with normal sinus rhythm.  High sensitive troponin 46.  BNP 4500.  Glucose 238.  Cardiology nephrology were consulted.  TRH consulted for further evaluation management of acute on chronic renal failure with decompensated systolic congestive heart failure.   Assessment & Plan:   Principal Problem:   Hyperkalemia Active Problems:   Essential hypertension   Type 2 diabetes mellitus with hyperlipidemia (HCC)   CAD (coronary artery disease)   Elevated troponin   CKD (chronic kidney disease) stage 4, GFR 15-29 ml/min (HCC)   Chronic systolic heart failure (HCC)   Macrocytic anemia   Pressure injury of skin   AKI (acute kidney injury) ([place])   Acute renal failure on CKD stage IV Patient presenting from PCP office with elevated creatinine.  Creatinine 3.59 on 7/19 now up to 4.61.  Etiology likely secondary to underlying history of hypertension, diabetes and chronic cardiorenal syndrome.  Underwent IR placement of tunneled dialysis catheter on 05/16/2021. --Nephrology following, appreciate assistance --Continue HD for volume management --BMP daily  Acute on chronic combined  systolic/diastolic congestive heart failure Essential hypertension now with hypotension Patient presenting with increased peripheral edema, elevated BNP 4500.  Etiology likely secondary to progressive renal failure, now HD dependent as above.  Cardiology was initially consulted, signed off on 05/17/2021.  Discontinued isosorbide-hydralazine and torsemide due to hypotension.  Received IV albumin on 8/7. --Continue volume management with HD as above --Continue midodrine 5 mg p.o. 3 times daily --Continue aspirin and statin --Strict I's and O's and daily weights --Continue monitor BP closely  CAD s/p CABG: Continue aspirin and statin  Type 2 diabetes mellitus Hemoglobin A1c 7.5 on 05/11/2021, fairly well controlled.  Diet controlled at home. --Levemir 6 units subcutaneously daily --Sensitive SSI for coverage --CBG qAC/HS  Goals of care: Patient with continued decline, now hypotensive today; discontinued of isosorbide-hydralazine and torsemide.  Given IV fluid bolus and albumin today.  Also starting on midodrine.  Has been seen by palliative care on 8/3, with desires to continue full scope of care.  Long discussion with patient and husband at bedside on 05/20/19/2022 regarding overall poor prognosis given her systolic dysfunction and now dialysis dependent.  Now patient has become hypotensive requiring support with midodrine.  Patient and husband state that we will continue current full scope of care and that "God will take care of her moving forward".  Reiterated with both patient and husband at bedside that she is critically ill with overall poor prognosis.    DVT prophylaxis: Place and maintain sequential compression device Start: 05/16/21 1224 SCDs Start: 04/30/2021 1855   Code Status: Full Code Family Communication: No family present at bedside this morning  Disposition Plan:  Level  of care: Telemetry Medical Status is: Inpatient  Remains inpatient appropriate because:Ongoing diagnostic  testing needed not appropriate for outpatient work up, Unsafe d/c plan, IV treatments appropriate due to intensity of illness or inability to take PO, and Inpatient level of care appropriate due to severity of illness  Dispo: The patient is from: Home              Anticipated d/c is to: Home              Patient currently is not medically stable to d/c.   Difficult to place patient No  Consultants:  Nephrology Cardiology/heart failure service -signed off 8/5  Procedures:  Tunneled HD catheter placement by IR on 8/4  Antimicrobials:  Cefazolin 8/4 - 8/4  Subjective: Patient seen examined at bedside, resting comfortably.  No family present this morning.  No specific complaints this morning.  Overall appears deconditioned and chronically ill.  Discussion with patient and family yesterday regarding goals of care, wish for full scope of treatment.  Overall very poor/grim prognosis given systolic dysfunction and now dialysis dependent.  No other acute concerns per nursing this morning.  Objective: Vitals:   05/20/21 0500 05/20/21 0600 05/20/21 0605 05/20/21 1131  BP: (!) 103/50   (!) 109/50  Pulse: 85   85  Resp: 15   16  Temp: 98.1 F (36.7 C)   98.1 F (36.7 C)  TempSrc: Oral   Oral  SpO2: 100% (!) 86% 100% 100%  Weight: 50.4 kg     Height:        Intake/Output Summary (Last 24 hours) at 05/20/2021 1237 Last data filed at 05/19/2021 2130 Gross per 24 hour  Intake 360 ml  Output --  Net 360 ml   Filed Weights   05/18/21 0800 05/18/21 1134 05/20/21 0500  Weight: 49.2 kg 48.7 kg 50.4 kg    Examination:  General exam: Appears calm and comfortable, elderly in appearance, appears older than stated age, chronically ill in appearance Respiratory system: Clear to auscultation. Respiratory effort normal.  On room air Cardiovascular system: S1 & S2 heard, RRR. No JVD, murmurs, rubs, gallops or clicks. No pedal edema.  Tunneled HD catheter noted to right chest Gastrointestinal system:  Abdomen is nondistended, soft and nontender. No organomegaly or masses felt. Normal bowel sounds heard. Central nervous system: Alert . No focal neurological deficits. Extremities: Symmetric 5 x 5 power. Skin: No rashes, lesions or ulcers Psychiatry: Judgement and insight appear normal. Mood & affect appropriate.     Data Reviewed: I have personally reviewed following labs and imaging studies  CBC: Recent Labs  Lab 05/16/21 0317 05/18/21 0218 05/20/21 0051  WBC 4.3 3.9* 5.8  HGB 7.9* 7.8* 8.5*  HCT 25.8* 25.7* 28.4*  MCV 107.1* 108.0* 109.2*  PLT 65* 60* [id number]*   Basic Metabolic Panel: Recent Labs  Lab 05/14/21 0754 05/15/21 0152 05/16/21 0317 05/17/21 0258 05/18/21 0218 05/20/21 0051  NA 142 141 138 138 135 135  K 4.6 4.4 4.1 4.2 4.2 4.5  CL 88* 87* 86* 94* 97* 99  CO2 44* 43* 39* 34* 29 30  GLUCOSE 204* 139* 158* 182* 111* 188*  BUN 89* 93* 94* 53* 29* 24*  CREATININE 4.02* 4.26* 4.55* 3.04* 2.39* 3.56*  CALCIUM 9.1 9.0 8.9 8.7* 8.7* 9.1  MG  --   --   --   --   --  2.2  PHOS 6.6* 7.0* 5.2*  --   --   --    GFR:  Estimated Creatinine Clearance: 12.4 mL/min (A) (by C-G formula based on SCr of 3.56 mg/dL (H)). Liver Function Tests: Recent Labs  Lab 05/14/21 0754 05/15/21 0152 05/16/21 0317  ALBUMIN 3.5 3.5 3.3*   No results for input(s): LIPASE, AMYLASE in the last 168 hours. No results for input(s): AMMONIA in the last 168 hours. Coagulation Profile: No results for input(s): INR, PROTIME in the last 168 hours. Cardiac Enzymes: No results for input(s): CKTOTAL, CKMB, CKMBINDEX, TROPONINI in the last 168 hours. BNP (last 3 results) Recent Labs    12/06/20 0927  PROBNP >5000.0*   HbA1C: No results for input(s): HGBA1C in the last 72 hours. CBG: Recent Labs  Lab 05/19/21 1139 05/19/21 1653 05/19/21 2112 05/20/21 0731 05/20/21 1129  GLUCAP 202* 229* 208* 188* 158*   Lipid Profile: No results for input(s): CHOL, HDL, LDLCALC, TRIG, CHOLHDL,  LDLDIRECT in the last 72 hours. Thyroid Function Tests: No results for input(s): TSH, T4TOTAL, FREET4, T3FREE, THYROIDAB in the last 72 hours. Anemia Panel: No results for input(s): VITAMINB12, FOLATE, FERRITIN, TIBC, IRON, RETICCTPCT in the last 72 hours.  Sepsis Labs: No results for input(s): PROCALCITON, LATICACIDVEN in the last 168 hours.  Recent Results (from the past 240 hour(s))  Resp Panel by RT-PCR (Flu A&B, Covid) Nasopharyngeal Swab     Status: None   Collection Time: 04/19/2021  6:37 PM   Specimen: Nasopharyngeal Swab; Nasopharyngeal(NP) swabs in vial transport medium  Result Value Ref Range Status   SARS Coronavirus 2 by RT PCR NEGATIVE NEGATIVE Final    Comment: (NOTE) SARS-CoV-2 target nucleic acids are NOT DETECTED.  The SARS-CoV-2 RNA is generally detectable in upper respiratory specimens during the acute phase of infection. The lowest concentration of SARS-CoV-2 viral copies this assay can detect is 138 copies/mL. A negative result does not preclude SARS-Cov-2 infection and should not be used as the sole basis for treatment or other patient management decisions. A negative result may occur with  improper specimen collection/handling, submission of specimen other than nasopharyngeal swab, presence of viral mutation(s) within the areas targeted by this assay, and inadequate number of viral copies(<138 copies/mL). A negative result must be combined with clinical observations, patient history, and epidemiological information. The expected result is Negative.  Fact Sheet for Patients:  EntrepreneurPulse.com.au  Fact Sheet for Healthcare Providers:  IncredibleEmployment.be  This test is no t yet approved or cleared by the [place] FDA and  has been authorized for detection and/or diagnosis of SARS-CoV-2 by FDA under an Emergency Use Authorization (EUA). This EUA will remain  in effect (meaning this test can be used) for the  duration of the COVID-19 declaration under Section 564(b)(1) of the Act, 21 U.S.C.section 360bbb-3(b)(1), unless the authorization is terminated  or revoked sooner.       Influenza A by PCR NEGATIVE NEGATIVE Final   Influenza B by PCR NEGATIVE NEGATIVE Final    Comment: (NOTE) The Xpert Xpress SARS-CoV-2/FLU/RSV plus assay is intended as an aid in the diagnosis of influenza from Nasopharyngeal swab specimens and should not be used as a sole basis for treatment. Nasal washings and aspirates are unacceptable for Xpert Xpress SARS-CoV-2/FLU/RSV testing.  Fact Sheet for Patients: EntrepreneurPulse.com.au  Fact Sheet for Healthcare Providers: IncredibleEmployment.be  This test is not yet approved or cleared by the [place] FDA and has been authorized for detection and/or diagnosis of SARS-CoV-2 by FDA under an Emergency Use Authorization (EUA). This EUA will remain in effect (meaning this test can be used) for the duration  of the COVID-19 declaration under Section 564(b)(1) of the Act, 21 U.S.C. section 360bbb-3(b)(1), unless the authorization is terminated or revoked.  Performed at [facility], [place] [address] [place], [place] [address]          Radiology Studies: DG CHEST PORT 1 VIEW  Result Date: 05/18/2021 CLINICAL DATA:  Shortness of breath EXAM: PORTABLE CHEST 1 VIEW COMPARISON:  05/11/2021 FINDINGS: Cardiac shadow remains enlarged. Postsurgical changes are noted. New right-sided jugular dialysis catheter is seen in satisfactory position. Chronic blunting of the costophrenic angles is again noted and stable. Mild central vascular congestion is seen. No acute infiltrate is noted. No bony abnormality is seen. IMPRESSION: Mild vascular congestion. Chronic blunting of the costophrenic angle stable from previous exam. Electronically Signed   By: Inez Catalina M.D.   On: 05/18/2021 20:54        Scheduled Meds:  aspirin  81 mg  Oral Daily   atorvastatin  40 mg Oral Daily   calcitRIOL  0.25 mcg Oral Daily   Chlorhexidine Gluconate Cloth  6 each Topical Q0600   darbepoetin (ARANESP) injection - DIALYSIS  60 mcg Intravenous Q Fri-HD   feeding supplement (NEPRO CARB STEADY)  237 mL Oral TID BM   insulin aspart  0-9 Units Subcutaneous TID WC   insulin detemir  6 Units Subcutaneous Daily   lactulose  10 g Oral Daily   midodrine  5 mg Oral TID WC   multivitamin  1 tablet Oral QHS   polyethylene glycol  17 g Oral Daily   sevelamer carbonate  800 mg Oral TID WC   Continuous Infusions:  anticoagulant sodium citrate     ferric gluconate (FERRLECIT) IVPB Stopped (05/19/21 1130)     LOS: 9 days    Time spent: 39 minutes spent on chart review, discussion with nursing staff, consultants, updating family and interview/physical exam; more than 50% of that time was spent in counseling and/or coordination of care.    Kenney Going J [place] DO [facility] Available via Epic secure chat 7am-7pm After these hours, please refer to coverage provider listed on [email or web address] 05/20/2021, 12:37 PM

## 2021-05-21 DIAGNOSIS — I5022 Chronic systolic (congestive) heart failure: Secondary | ICD-10-CM | POA: Diagnosis not present

## 2021-05-21 DIAGNOSIS — E875 Hyperkalemia: Secondary | ICD-10-CM | POA: Diagnosis not present

## 2021-05-21 DIAGNOSIS — Z515 Encounter for palliative care: Secondary | ICD-10-CM | POA: Diagnosis not present

## 2021-05-21 DIAGNOSIS — R0609 Other forms of dyspnea: Secondary | ICD-10-CM

## 2021-05-21 DIAGNOSIS — N186 End stage renal disease: Secondary | ICD-10-CM

## 2021-05-21 LAB — RENAL FUNCTION PANEL
Albumin: 3.4 g/dL — ABNORMAL LOW (ref 3.5–5.0)
Anion gap: 9 (ref 5–15)
BUN: 12 mg/dL (ref 8–23)
CO2: 27 mmol/L (ref 22–32)
Calcium: 8.5 mg/dL — ABNORMAL LOW (ref 8.9–10.3)
Chloride: 99 mmol/L (ref 98–111)
Creatinine, Ser: 2.26 mg/dL — ABNORMAL HIGH (ref 0.44–1.00)
GFR, Estimated: 23 mL/min — ABNORMAL LOW (ref >60)
Glucose, Bld: 112 mg/dL — ABNORMAL HIGH (ref 70–99)
Phosphorus: 2.6 mg/dL (ref 2.5–4.6)
Potassium: 3.5 mmol/L (ref 3.5–5.1)
Sodium: 135 mmol/L (ref 135–145)

## 2021-05-21 LAB — GLUCOSE, CAPILLARY
Glucose-Capillary: 120 mg/dL — ABNORMAL HIGH (ref 70–99)
Glucose-Capillary: 171 mg/dL — ABNORMAL HIGH (ref 70–99)
Glucose-Capillary: 122 mg/dL — ABNORMAL HIGH (ref 70–99)
Glucose-Capillary: 92 mg/dL (ref 70–99)

## 2021-05-21 MED ORDER — MORPHINE SULFATE (PF) 2 MG/ML IV SOLN
1.0000 mg | INTRAVENOUS | Status: DC | PRN
Start: 2021-05-21 — End: 2021-05-22

## 2021-05-21 MED ORDER — LORAZEPAM 2 MG/ML IJ SOLN: 0.5000 mg | Freq: Four times a day (QID) | INTRAMUSCULAR | Status: DC | PRN

## 2021-05-21 MED ORDER — DARBEPOETIN ALFA 100 MCG/0.5ML IJ SOSY: 100.0000 ug | PREFILLED_SYRINGE | INTRAMUSCULAR | Status: DC

## 2021-05-21 MED ORDER — CHLORHEXIDINE GLUCONATE CLOTH 2 % EX PADS: 6.0000 | MEDICATED_PAD | Freq: Every day | CUTANEOUS | Status: DC

## 2021-05-21 NOTE — Progress Notes (Signed)
PT Cancellation Note  Patient Details Name: Beverly Martin MRN: [id number] DOB: 06/19/1956   Cancelled Treatment:    Reason Eval/Treat Not Completed: Fatigue/lethargy limiting ability to participate. Pt with medical decline and with little interaction. Will defer today and continue to monitor for appropriateness for therapy.    Shary Decamp [facility] 05/21/2021, 3:52 PM [place] Pager [phone] Office [phone]

## 2021-05-21 NOTE — Progress Notes (Addendum)
Initial Nutrition Assessment  DOCUMENTATION CODES:   Non-severe (moderate) malnutrition in context of chronic illness  INTERVENTION:   D/C Nepro Shake, patient is not drinking. Continue Renal multivitamin PO once daily. Add Magic cup TID with meals, each supplement provides 290 kcal and 9 grams of protein. Recommend liberalize diet to regular to help improve intake.   NUTRITION DIAGNOSIS:   Moderate Malnutrition related to chronic illness (CHF, CKD) as evidenced by moderate fat depletion, mild muscle depletion, moderate muscle depletion, severe muscle depletion.  Ongoing  GOAL:   Patient will meet greater than or equal to 90% of their needs  Unmet  MONITOR:   PO intake, Supplement acceptance, Skin, Labs  REASON FOR ASSESSMENT:   Consult Poor PO  ASSESSMENT:   65 yo female admitted with hyperkalemia. PMH includes CHF, DM, CAD, HTN, HLD, CKD.  Patient awake, nodded head yes when asked if she was doing okay. She did not provide any other information or answer any other questions.   Lunch tray in room, minimal intake.  Per MAR, patient has been refusing Nepro supplements.  Meal intakes documented at 0-50% of meals.   Last HD 8/8. Next HD scheduled for 8/10.  Plans to continue dialysis, but if she does not tolerate, will transition to hospice care.   Admission weight 49.9 kg Current weight 50.5 kg  Labs reviewed.  CBG: 120-171  Medications reviewed and include calcitriol, aranesp, novolog, levemir, lactulose, Renvela.  Patient meets criteria for moderate malnutrition with moderate depletion of fat mass and mild-moderate-severe depletion of muscle mass.  NUTRITION - FOCUSED PHYSICAL EXAM:  Flowsheet Row Most Recent Value  Orbital Region No depletion  Upper Arm Region Moderate depletion  Thoracic and Lumbar Region Moderate depletion  Buccal Region No depletion  Temple Region Mild depletion  Clavicle Bone Region Moderate depletion  Clavicle and Acromion Bone  Region Moderate depletion  Scapular Bone Region Moderate depletion  Dorsal Hand Mild depletion  Patellar Region Severe depletion  Anterior Thigh Region Severe depletion  Posterior Calf Region Severe depletion  Edema (RD Assessment) Mild  Hair Reviewed  Eyes Reviewed  Mouth Unable to assess  Skin Reviewed  Nails Reviewed       Diet Order:   Diet Order             Diet renal/carb modified with fluid restriction Diet-HS Snack? Nothing; Fluid restriction: 1200 mL Fluid; Room service appropriate? Yes; Fluid consistency: Thin  Diet effective now                   EDUCATION NEEDS:   Not appropriate for education at this time  Skin:  Skin Assessment: Skin Integrity Issues: Skin Integrity Issues:: Stage I Stage I: buttocks  Last BM:  8/5  Height:   Ht Readings from Last 1 Encounters:  05/07/2021 '5\' 5"'$  (1.651 m)    Weight:   Wt Readings from Last 1 Encounters:  05/21/21 50.5 kg    Ideal Body Weight:  56.8 kg  BMI:  Body mass index is 18.53 kg/m.  Estimated Nutritional Needs:   Kcal:  1600-1800  Protein:  70-80 gm  Fluid:  1 L + UOP    Lucas Mallow, RD, LDN, CNSC Please refer to Amion for contact information.

## 2021-05-21 NOTE — Progress Notes (Signed)
Palliative Care Progress Note, Assessment & Plan   Patient Name: Beverly Martin       Date: 05/21/2021 DOB: 22-Aug-1956  Age: 65 y.o. MRN#: [id number] Attending Physician: [place] Eric J, DO Primary Care Physician: Billie Ruddy, MD Admit Date: 05/02/2021  Reason for Consultation/Follow-up: Establishing goals of care  HPI:  Beverly Martin is a 65 y.o. female admitted on 04/21/2021 with a past  medical history significant for systolic heart failure, diabetes type 2, coronary disease status post CABG, hypertension, hyperlipidemia, and CKD that presented to the ED from PCP office for evaluation of hyperkalemia and elevated creatinine.    Patient had echo in March showing EF of 20 to 25%, down from echo 04/25/2021 with EF of 30 to 35%.  Dialysis was initiated this admission, patient has had 3 dialysis treatments at this point in time.   Today is day 10 of this hospitalization. Pt is more lethargic today, taking minimal po intake.   Her medical team, including nephrology and cardiology, are concerned for patient's ability to tolerate hemodialysis.     Continued physical and  functional decline.  Family face treatment option decisions, advanced directive decisions and anticipatory care needs.   Subjective:  Pt is lethargic. She appears pale and weak. Her respirations are shallow but regular.  Plan of Care:  This NP Wadie Lessen reviewed medical records, received report from team, assessed the patient and then meet at the patient's bedside along with her husband [name] for continued education regarding  diagnosis, prognosis, GOC, EOL wishes disposition and options.  I created space and opportunity for husband to explore his thoughts and feelings regarding the patient's current medical situation.  He is tearful  as he verbalizes his understanding that his wife continues to decline.  He understands the seriousness of the current medical situation and the associated poor prognosis.  He verbalizes many times throughout our conversation the importance of comfort for his wife at this time.  Husband verbalizes that he is receiving support at this difficult time through his family, his church affiliation and his friends in the community.  Although the patient is weak and lethargic she was able to communicate to [place] the importance of comfort and dignity at this time in her life.  [name], the patient's husband, is in agreement with implementing comfort measures specific to his wife's dyspnea and underlying pain, continuing other supportive measures.  Education offered on the unlikelihood that patient will be able to tolerate dialysis at this time in her life.   Education offered on the limitations of medical interventions to prolong life when the body fails to thrive.  Questions and concerns addressed  Education offered on the natural trajectory and expectations at end-of-life. Education offered that patient is fragile and continues to decline, and that anything can happen at any time.  Anticipated hospital death   Code Status: DNR-documented today  Prognosis:   Hours to days  Discharge Planning: Anticipate hospital death  Recommendations/Plan: Pain/dyspnea; morphine 1 mg IV every 3 hours as needed Anxiety/ agitation: Ativan 0.5 mg IV every 6 hours as needed Minimize nonessential medications  Care plan was discussed with patient's husband and Dr. [place]  Length of Stay: 10  Physical Exam Constitutional:      Appearance: She is cachectic. She is ill-appearing.     Comments: Not alert, gaze is fixed  Cardiovascular:     Rate and Rhythm: Normal rate and regular rhythm.  Pulmonary:     Breath sounds: Decreased air movement present.  Abdominal:     Palpations: Abdomen is soft.  Musculoskeletal:      Right lower leg: Edema present.     Left lower leg: Edema present.  Skin:    General: Skin is warm and dry.  Neurological:     Mental Status: She is lethargic.     Motor: Weakness present.            Vital Signs: BP (!) 94/53 (BP Location: Right Arm)   Pulse 92   Temp 99 F (37.2 C) (Axillary)   Resp (!) 22   Ht '5\' 5"'$  (1.651 m)   Wt 50.5 kg   SpO2 99%   BMI 18.53 kg/m  SpO2: SpO2: 99 % O2 Device: O2 Device: Nasal Cannula O2 Flow Rate: O2 Flow Rate (L/min): 2 L/min      Palliative Assessment/Data: 30% at best      Total time spent on the unit was 70 minutes Greater than 50%  of this time was spent counseling and coordinating care related to the above assessment and plan.  Thank you for allowing the Palliative Medicine Team to assist in the care of this patient.  Wadie Lessen NP Palliative Medicine Team Team Phone # [phone]

## 2021-05-21 NOTE — Progress Notes (Signed)
   05/21/21 0732  Assess: MEWS Score  Temp 98.1 F (36.7 C)  BP (!) 93/50  ECG Heart Rate 100  Resp (!) 22  Assess: MEWS Score  MEWS Temp 0  MEWS Systolic 1  MEWS Pulse 0  MEWS RR 1  MEWS LOC 0  MEWS Score 2  MEWS Score Color Yellow  Assess: if the MEWS score is Yellow or Red  Were vital signs taken at a resting state? Yes  Focused Assessment No change from prior assessment  Early Detection of Sepsis Score *See Row Information* Low  MEWS guidelines implemented *See Row Information* No, vital signs rechecked  Treat  MEWS Interventions Administered scheduled meds/treatments  Pain Scale 0-10  Pain Score 0  Neuro symptoms relieved by Rest  Take Vital Signs  Increase Vital Sign Frequency  Yellow: Q 2hr X 2 then Q 4hr X 2, if remains yellow, continue Q 4hrs  Escalate  MEWS: Escalate Yellow: discuss with charge nurse/RN and consider discussing with provider and RRT  Notify: Charge Nurse/RN  Name of Charge Nurse/RN Notified Yoko  Date Charge Nurse/RN Notified 05/21/21  Time Charge Nurse/RN Notified 0735  Document  Patient Outcome Stabilized after interventions  Progress note created (see row info) Yes  To be given Midodrine, will continue to monitor

## 2021-05-21 NOTE — Progress Notes (Addendum)
Patient ID: Beverly Martin, female   DOB: November 21, 1955, 65 y.o.   MRN: [id number] [facility] Progress Note   Assessment/ Plan:   1. Acute kidney Injury on chronic kidney disease stage IV-likely progressed to ESRD: With history of underlying hypertension, diabetes and chronic cardiorenal syndrome with worsening renal function/diuretic resistance prompting need to start dialysis for uremia/volume.  Torsemide stopped as no urine output.   - No acute indication for HD today.  Plan for next HD on 8/10  - She is currently getting hemodialysis via [facility] with plans for permanent access deferred until we confirm her ability to tolerate hemodialysis.  Note palliative is following and assisting with goals of care - would transition off of morphine if aggressive treatment is still being pursued. Defer to primary team  - on midodrine now  2.  Acute exacerbation of combined diastolic/systolic congestive heart failure: Poor response to diuretic treatment earlier and now on hemodialysis for volume/management of azotemia with improving exam  3.  Metabolic alkalosis: Secondary to diuresis/contraction-improving with hemodialysis   4.  Anemia of chronic illness/chronic kidney disease: Significant iron deficiency seen on labs; Now on intravenous iron/ferrlecit and on aranesp cg every friday. Improving on last check. Increase aranesp to 100 mcg every Friday.   5.  Chronic kidney disease/metabolic bone disease: hyperphos improved improved with HD and sevelamer 3 times daily AC.  Continue calcitriol for PTH control while here   6.  Hypotension - hx HTN and now with hypotension and has been on midodrine.   7. AMS - not improved with HD yesterday; not able to get much from her today or yesterday that I have seen her.  If no improvement after tomorrow and no other reversible causes identified would recommend not pursuing additional HD   Dispo - palliative following.  Continue inpatient monitor   Subjective:    no urine output is charted over 8/8.  Last HD on 8/8 with 1.6 kg UF.  Palliative has seen her.  Per their note she is now a DNR.  I spoke with her husband at bedside and he states that he wants to see how she does on dialysis and if she does not tolerate he would then plan to transition to hospice  Review of systems:  Unable to obtain 2/2 AMS     Objective:   BP (!) 104/52   Pulse 86   Temp 99 F (37.2 C) (Axillary)   Resp 14   Ht '5\' 5"'$  (1.651 m)   Wt 50.5 kg   SpO2 100%   BMI 18.53 kg/m   Intake/Output Summary (Last 24 hours) at 05/21/2021 1314 Last data filed at 05/20/2021 1900 Gross per 24 hour  Intake --  Output 1600 ml  Net -1600 ml   Weight change: 0.405 kg  Physical Exam:  General elderly female in bed in no acute distress HEENT normocephalic atraumatic extraocular movements intact sclera anicteric Neck supple trachea midline Lungs clear but reduced; on 1 liters oxygen Heart S1S2 no rub Abdomen soft nontender nondistended Extremities no edema  Psych normal mood and affect Neuro eyes open. Does not answer orienting questions or review of systems questions.  Softly states June/July when asked identifying information  Access RIJ tunn catheter    Imaging: No results found.  Labs: BMET Recent Labs  Lab 05/15/21 0152 05/16/21 0317 05/17/21 0258 05/18/21 0218 05/20/21 0051 05/21/21 0155  NA 141 138 138 135 135 135  K 4.4 4.1 4.2 4.2 4.5 3.5  CL 87*  86* 94* 97* 99 99  CO2 43* 39* 34* '29 30 27  '$ GLUCOSE 139* 158* 182* 111* 188* 112*  BUN 93* 94* 53* 29* 24* 12  CREATININE 4.26* 4.55* 3.04* 2.39* 3.56* 2.26*  CALCIUM 9.0 8.9 8.7* 8.7* 9.1 8.5*  PHOS 7.0* 5.2*  --   --   --  2.6   CBC Recent Labs  Lab 05/16/21 0317 05/18/21 0218 05/20/21 0051  WBC 4.3 3.9* 5.8  HGB 7.9* 7.8* 8.5*  HCT 25.8* 25.7* 28.4*  MCV 107.1* 108.0* 109.2*  PLT 65* 60* 140*    Medications:     aspirin  81 mg Oral Daily   calcitRIOL  0.25 mcg Oral Daily   Chlorhexidine  Gluconate Cloth  6 each Topical Q0600   darbepoetin (ARANESP) injection - DIALYSIS  60 mcg Intravenous Q Fri-HD   feeding supplement (NEPRO CARB STEADY)  237 mL Oral TID BM   insulin aspart  0-9 Units Subcutaneous TID WC   insulin detemir  6 Units Subcutaneous Daily   lactulose  10 g Oral Daily   midodrine  5 mg Oral TID WC   polyethylene glycol  17 g Oral Daily   sevelamer carbonate  800 mg Oral TID WC   Claudia Desanctis, MD 05/21/2021, 1:41 PM

## 2021-05-21 NOTE — Progress Notes (Signed)
Out Patient Arrangements:  Noted Dr. Royce Macadamia had spoken with pt's husband at bedside and that he wants to see how she does on dialysis and if she does not tolerate he would then plan to transition to hospice. Please advise if services are needed.   Linus Orn [facility] [phone]

## 2021-05-21 NOTE — Progress Notes (Signed)
PROGRESS NOTE    Beverly Martin  [id number] DOB: 07/27/1956 DOA: 05/12/2021 PCP: Billie Ruddy, MD    Brief Narrative:  Beverly Martin is a 65 year old female with past medical history significant for chronic systolic congestive heart failure, type 2 diabetes mellitus, CAD s/p CABG, essential hypertension, hyperlipidemia, CKD stage IV who presents to [facility] ED on 7/29 from PCP office with hyperkalemia and elevated creatinine.  Patient reports shortness of breath, generalized weakness, some chest tightness with increased lower extremity edema over the last week.    Recent TTE March 2022 with LVEF 20-25%.  In the ED, BP 145/74, HR 90, SPO2 within normal limits on 2 L nasal cannula.  Creatinine elevated 4.61 (3.59 on 7/19).  Chest x-ray with left greater than right small pleural effusions with basilar atelectasis.  EKG with normal sinus rhythm.  High sensitive troponin 46.  BNP 4500.  Glucose 238.  Cardiology nephrology were consulted.  TRH consulted for further evaluation management of acute on chronic renal failure with decompensated systolic congestive heart failure.   Assessment & Plan:   Principal Problem:   Hyperkalemia Active Problems:   Essential hypertension   Type 2 diabetes mellitus with hyperlipidemia (HCC)   CAD (coronary artery disease)   Elevated troponin   CKD (chronic kidney disease) stage 4, GFR 15-29 ml/min (HCC)   Chronic systolic heart failure (HCC)   Macrocytic anemia   Pressure injury of skin   AKI (acute kidney injury) ([place])   Acute renal failure on CKD stage IV Patient presenting from PCP office with elevated creatinine.  Creatinine 3.59 on 7/19 now up to 4.61.  Etiology likely secondary to underlying history of hypertension, diabetes and chronic cardiorenal syndrome.  Underwent IR placement of tunneled dialysis catheter on 05/16/2021.  Continues with functional decline, altered mental status and hypotension despite midodrine, nephrology on sure if we will able  to tolerate outpatient HD. --Nephrology following, appreciate assistance --Continue HD for volume management --BMP daily  Acute on chronic combined systolic/diastolic congestive heart failure Essential hypertension now with hypotension Patient presenting with increased peripheral edema, elevated BNP 4500.  Etiology likely secondary to progressive renal failure, now HD dependent as above.  Cardiology was initially consulted, signed off on 05/17/2021.  Discontinued isosorbide-hydralazine and torsemide due to hypotension.  Received IV albumin on 8/7. --Continue volume management with HD as above --Continue midodrine 5 mg p.o. TID --Continue aspirin and statin --Strict I's and O's and daily weights --Continue monitor BP closely  CAD s/p CABG: Continue aspirin and statin  Type 2 diabetes mellitus Hemoglobin A1c 7.5 on 05/11/2021, fairly well controlled.  Diet controlled at home. --Levemir 6 units subcutaneously daily --Sensitive SSI for coverage --CBG qAC/HS  Goals of care: Patient with continued decline, now hypotensive today; discontinued of isosorbide-hydralazine and torsemide.  Given IV fluid bolus and albumin today.  Also starting on midodrine.  Has been seen by palliative care on 8/3, with desires to continue full scope of care.  Long discussion with patient and husband at bedside on 8/7 regarding overall poor prognosis given her systolic dysfunction and now dialysis dependent.  Now patient has become hypotensive requiring support with midodrine.  Patient and husband state that we will continue current full scope of care and that "God will take care of her moving forward".  Reiterated with both patient and husband at bedside that she is critically ill with overall poor prognosis with recommendations of hospice.    DVT prophylaxis: Place and maintain sequential compression device Start: 05/16/21  79 SCDs Start: 05/01/2021 1855   Code Status: DNR Family Communication: Patient's husband,  sister and brother-in-law who is present at bedside this morning  Disposition Plan:  Level of care: Telemetry Medical Status is: Inpatient  Remains inpatient appropriate because:Ongoing diagnostic testing needed not appropriate for outpatient work up, Unsafe d/c plan, IV treatments appropriate due to intensity of illness or inability to take PO, and Inpatient level of care appropriate due to severity of illness  Dispo: The patient is from: Home              Anticipated d/c is to: Home              Patient currently is not medically stable to d/c.   Difficult to place patient No  Consultants:  Nephrology Cardiology/heart failure service -signed off 8/5  Procedures:  Tunneled HD catheter placement by IR on 8/4  Antimicrobials:  Cefazolin 8/4 - 8/4  Subjective: Patient seen examined at bedside, resting comfortably.  Husband, sister, and brother-in-law present at bedside this morning.  Patient continues to gradually decline, continues to be altered despite dialysis yesterday.  Nephrology concerned that she will be able to tolerate dialysis given her continued decline without any significant improvement with HD.  Patient remains chronically ill and severely deconditioned.  Seen by palliative care yesterday with transition to DNR status.  Overall very poor/grim prognosis given systolic dysfunction and now dialysis dependent.  No acute concerns overnight per nursing staff.  Objective: Vitals:   05/21/21 0648 05/21/21 0732 05/21/21 0805 05/21/21 1111  BP: (!) 84/59 (!) 94/53  (!) 104/52  Pulse: 92   86  Resp: (!) 23 (!) 22  14  Temp: 98.1 F (36.7 C) 98.1 F (36.7 C) 99 F (37.2 C)   TempSrc: Oral Oral Axillary Axillary  SpO2: 99%   100%  Weight: 50.5 kg     Height:        Intake/Output Summary (Last 24 hours) at 05/21/2021 1407 Last data filed at 05/20/2021 1900 Gross per 24 hour  Intake --  Output 1600 ml  Net -1600 ml   Filed Weights   05/20/21 0500 05/20/21 1545 05/21/21  0648  Weight: 50.4 kg 50.8 kg 50.5 kg    Examination:  General exam: Appears calm and comfortable, elderly in appearance, appears older than stated age, chronically ill in appearance Respiratory system: Clear to auscultation. Respiratory effort normal.  On room air Cardiovascular system: S1 & S2 heard, RRR. No JVD, murmurs, rubs, gallops or clicks. No pedal edema.  Tunneled HD catheter noted to right chest Gastrointestinal system: Abdomen is nondistended, soft and nontender. No organomegaly or masses felt. Normal bowel sounds heard. Central nervous system: Alert but not oriented. No focal neurological deficits. Extremities: Symmetric 5 x 5 power. Skin: No rashes, lesions or ulcers Psychiatry: Judgement and insight appear normal. Mood & affect appropriate.     Data Reviewed: I have personally reviewed following labs and imaging studies  CBC: Recent Labs  Lab 05/16/21 0317 05/18/21 0218 05/20/21 0051  WBC 4.3 3.9* 5.8  HGB 7.9* 7.8* 8.5*  HCT 25.8* 25.7* 28.4*  MCV 107.1* 108.0* 109.2*  PLT 65* 60* [id number]*   Basic Metabolic Panel: Recent Labs  Lab 05/15/21 0152 05/16/21 0317 05/17/21 0258 05/18/21 0218 05/20/21 0051 05/21/21 0155  NA 141 138 138 135 135 135  K 4.4 4.1 4.2 4.2 4.5 3.5  CL 87* 86* 94* 97* 99 99  CO2 43* 39* 34* '29 30 27  '$ GLUCOSE 139* 158* 182*  111* 188* 112*  BUN 93* 94* 53* 29* 24* 12  CREATININE 4.26* 4.55* 3.04* 2.39* 3.56* 2.26*  CALCIUM 9.0 8.9 8.7* 8.7* 9.1 8.5*  MG  --   --   --   --  2.2  --   PHOS 7.0* 5.2*  --   --   --  2.6   GFR: Estimated Creatinine Clearance: 19.5 mL/min (A) (by C-G formula based on SCr of 2.26 mg/dL (H)). Liver Function Tests: Recent Labs  Lab 05/15/21 0152 05/16/21 0317 05/21/21 0155  ALBUMIN 3.5 3.3* 3.4*   No results for input(s): LIPASE, AMYLASE in the last 168 hours. No results for input(s): AMMONIA in the last 168 hours. Coagulation Profile: No results for input(s): INR, PROTIME in the last 168  hours. Cardiac Enzymes: No results for input(s): CKTOTAL, CKMB, CKMBINDEX, TROPONINI in the last 168 hours. BNP (last 3 results) Recent Labs    12/06/20 0927  PROBNP >5000.0*   HbA1C: No results for input(s): HGBA1C in the last 72 hours. CBG: Recent Labs  Lab 05/20/21 0731 05/20/21 1129 05/20/21 2148 05/21/21 0758 05/21/21 1110  GLUCAP 188* 158* 110* 120* 171*   Lipid Profile: No results for input(s): CHOL, HDL, LDLCALC, TRIG, CHOLHDL, LDLDIRECT in the last 72 hours. Thyroid Function Tests: No results for input(s): TSH, T4TOTAL, FREET4, T3FREE, THYROIDAB in the last 72 hours. Anemia Panel: No results for input(s): VITAMINB12, FOLATE, FERRITIN, TIBC, IRON, RETICCTPCT in the last 72 hours.  Sepsis Labs: No results for input(s): PROCALCITON, LATICACIDVEN in the last 168 hours.  No results found for this or any previous visit (from the past 240 hour(s)).        Radiology Studies: No results found.      Scheduled Meds:  aspirin  81 mg Oral Daily   calcitRIOL  0.25 mcg Oral Daily   Chlorhexidine Gluconate Cloth  6 each Topical Q0600   [START ON 05/24/2021] darbepoetin (ARANESP) injection - DIALYSIS  100 mcg Intravenous Q Fri-HD   feeding supplement (NEPRO CARB STEADY)  237 mL Oral TID BM   insulin aspart  0-9 Units Subcutaneous TID WC   insulin detemir  6 Units Subcutaneous Daily   lactulose  10 g Oral Daily   midodrine  5 mg Oral TID WC   polyethylene glycol  17 g Oral Daily   sevelamer carbonate  800 mg Oral TID WC   Continuous Infusions:  anticoagulant sodium citrate       LOS: 10 days    Time spent: 39 minutes spent on chart review, discussion with nursing staff, consultants, updating family and interview/physical exam; more than 50% of that time was spent in counseling and/or coordination of care.    Johnny Latu J [place] DO [facility] Available via Epic secure chat 7am-7pm After these hours, please refer to coverage provider listed on  [email or web address] 05/21/2021, 2:07 PM

## 2021-05-22 ENCOUNTER — Encounter (HOSPITAL_COMMUNITY): Payer: BC Managed Care – PPO | Admitting: Internal Medicine

## 2021-05-22 ENCOUNTER — Ambulatory Visit: Payer: BC Managed Care – PPO | Admitting: Family Medicine

## 2021-05-22 DIAGNOSIS — E440 Moderate protein-calorie malnutrition: Secondary | ICD-10-CM | POA: Insufficient documentation

## 2021-05-22 DIAGNOSIS — N179 Acute kidney failure, unspecified: Secondary | ICD-10-CM | POA: Diagnosis not present

## 2021-05-22 DIAGNOSIS — I5022 Chronic systolic (congestive) heart failure: Secondary | ICD-10-CM | POA: Diagnosis not present

## 2021-05-22 DIAGNOSIS — N184 Chronic kidney disease, stage 4 (severe): Secondary | ICD-10-CM | POA: Diagnosis not present

## 2021-05-22 LAB — MAGNESIUM: Magnesium: 2.2 mg/dL (ref 1.7–2.4)

## 2021-05-22 LAB — CBC
HCT: 27.4 % — ABNORMAL LOW (ref 36.0–46.0)
Hemoglobin: 8.4 g/dL — ABNORMAL LOW (ref 12.0–15.0)
MCH: 32.4 pg (ref 26.0–34.0)
MCHC: 30.7 g/dL (ref 30.0–36.0)
MCV: 105.8 fL — ABNORMAL HIGH (ref 80.0–100.0)
Platelets: 177 10*3/uL (ref 150–400)
RBC: 2.59 MIL/uL — ABNORMAL LOW (ref 3.87–5.11)
RDW: 14.1 % (ref 11.5–15.5)
WBC: 8.7 10*3/uL (ref 4.0–10.5)
nRBC: 0.9 % — ABNORMAL HIGH (ref 0.0–0.2)

## 2021-05-22 LAB — COMPREHENSIVE METABOLIC PANEL
ALT: 7 U/L (ref 0–44)
AST: 16 U/L (ref 15–41)
Albumin: 3.3 g/dL — ABNORMAL LOW (ref 3.5–5.0)
Alkaline Phosphatase: 69 U/L (ref 38–126)
Anion gap: 9 (ref 5–15)
BUN: 32 mg/dL — ABNORMAL HIGH (ref 8–23)
CO2: 28 mmol/L (ref 22–32)
Calcium: 8.9 mg/dL (ref 8.9–10.3)
Chloride: 99 mmol/L (ref 98–111)
Creatinine, Ser: 3.88 mg/dL — ABNORMAL HIGH (ref 0.44–1.00)
GFR, Estimated: 12 mL/min — ABNORMAL LOW (ref >60)
Glucose, Bld: 58 mg/dL — ABNORMAL LOW (ref 70–99)
Potassium: 4.4 mmol/L (ref 3.5–5.1)
Sodium: 136 mmol/L (ref 135–145)
Total Bilirubin: 0.7 mg/dL (ref 0.3–1.2)
Total Protein: 6.1 g/dL — ABNORMAL LOW (ref 6.5–8.1)

## 2021-05-22 LAB — GLUCOSE, CAPILLARY
Glucose-Capillary: 59 mg/dL — ABNORMAL LOW (ref 70–99)
Glucose-Capillary: 111 mg/dL — ABNORMAL HIGH (ref 70–99)
Glucose-Capillary: 111 mg/dL — ABNORMAL HIGH (ref 70–99)

## 2021-05-22 MED ORDER — MORPHINE SULFATE (PF) 2 MG/ML IV SOLN
1.0000 mg | INTRAVENOUS | Status: DC | PRN
  Administered 2021-05-22 – 2021-05-23 (×2): 1 mg via INTRAVENOUS
  Administered 2021-05-23 – 2021-05-25 (×6): 2 mg via INTRAVENOUS
  Filled 2021-05-22 (×8): qty 1

## 2021-05-22 MED ORDER — DEXTROSE 50 % IV SOLN
12.5000 g | INTRAVENOUS | Status: AC
  Administered 2021-05-22: 12.5 g via INTRAVENOUS
  Filled 2021-05-22: qty 50

## 2021-05-22 MED ORDER — GLYCOPYRROLATE 0.2 MG/ML IJ SOLN: 0.2000 mg | INTRAMUSCULAR | Status: DC | PRN

## 2021-05-22 MED ORDER — HEPARIN SODIUM (PORCINE) 1000 UNIT/ML IJ SOLN
INTRAMUSCULAR | Status: AC
  Filled 2021-05-22: qty 4

## 2021-05-22 MED ORDER — POLYVINYL ALCOHOL 1.4 % OP SOLN
1.0000 [drp] | Freq: Four times a day (QID) | OPHTHALMIC | Status: DC | PRN
  Filled 2021-05-22: qty 15

## 2021-05-22 MED ORDER — BIOTENE DRY MOUTH MT LIQD: 15.0000 mL | OROMUCOSAL | Status: DC | PRN

## 2021-05-22 MED ORDER — HYDROMORPHONE HCL 1 MG/ML IJ SOLN: 0.2500 mg | INTRAMUSCULAR | Status: DC | PRN

## 2021-05-22 MED ORDER — GLYCOPYRROLATE 1 MG PO TABS
1.0000 mg | ORAL_TABLET | ORAL | Status: DC | PRN
  Filled 2021-05-22: qty 1

## 2021-05-22 NOTE — Progress Notes (Signed)
PROGRESS NOTE    Beverly Martin  [id number] DOB: Oct 27, 1955 DOA: 04/21/2021 PCP: Billie Ruddy, MD    Brief Narrative:   Beverly Martin is a 65 year old female with past medical history significant for chronic systolic congestive heart failure, type 2 diabetes mellitus, CAD s/p CABG, essential hypertension, hyperlipidemia, CKD stage IV who presents to [facility] ED on 7/29 from PCP office with hyperkalemia and elevated creatinine.  Patient reports shortness of breath, generalized weakness, some chest tightness with increased lower extremity edema over the last week.    Recent TTE March 2022 with LVEF 20-25%.  In the ED, BP 145/74, HR 90, SPO2 within normal limits on 2 L nasal cannula.  Creatinine elevated 4.61 (3.59 on 7/19).  Chest x-ray with left greater than right small pleural effusions with basilar atelectasis.  EKG with normal sinus rhythm.  High sensitive troponin 46.  BNP 4500.  Glucose 238.  Cardiology nephrology were consulted.  TRH consulted for further evaluation management of acute on chronic renal failure with decompensated systolic congestive heart failure.   Assessment & Plan:   Principal Problem:   Hyperkalemia Active Problems:   Essential hypertension   Type 2 diabetes mellitus with hyperlipidemia (HCC)   CAD (coronary artery disease)   Elevated troponin   CKD (chronic kidney disease) stage 4, GFR 15-29 ml/min (HCC)   Chronic systolic heart failure (HCC)   Macrocytic anemia   Pressure injury of skin   AKI (acute kidney injury) ([place])   Malnutrition of moderate degree   Acute renal failure on CKD stage IV Patient presenting from PCP office with elevated creatinine.  Creatinine 3.59 on 7/19 now up to 4.61.  Etiology likely secondary to underlying history of hypertension, diabetes and chronic cardiorenal syndrome.  Underwent IR placement of tunneled dialysis catheter on 05/16/2021.   -Despite starting hemodialysis, patient continues to functional decline, failure to  thrive, she has been difficult to dialyze secondary to altered mental status, generalized weakness and debility, and hypotension despite midodrine . -No unsure if patient can tolerate outpatient hemodialysis, please see discussion below under goals of care.  .   Acute on chronic combined systolic/diastolic congestive heart failure Essential hypertension now with hypotension Patient presenting with increased peripheral edema, elevated BNP 4500.  Etiology likely secondary to progressive renal failure, now HD dependent as above.  Cardiology was initially consulted, signed off on 05/17/2021.  Discontinued isosorbide-hydralazine and torsemide due to hypotension.  Received IV albumin on 8/7. --Continue volume management with HD as above --Continue midodrine 5 mg p.o. TID --Continue aspirin and statin --Strict I's and O's and daily weights -- Management with hemodialysis, low blood pressure limiting HD.  CAD s/p CABG: Continue aspirin and statin  Type 2 diabetes mellitus Hemoglobin A1c 7.5 on 05/11/2021, fairly well controlled.  Diet controlled at home. --Levemir 6 units subcutaneously daily --Sensitive SSI for coverage --CBG qAC/HS  Goals of care: -Overall prognosis is very poor, and this significantly deconditioned, frail patient, with very poor tolerance to hemodialysis, palliative medicine input greatly appreciated, and started to understand overall poor prognosis, he wants to continue with current measure, but as well he is in agreement with implementing comfort measures specific to his wife's dyspnea, and underlying pain, he was started on morphine as needed, I will go ahead change to dose Dilaudid given she is dialysis.    DVT prophylaxis: Place and maintain sequential compression device Start: 05/16/21 1224 SCDs Start: 04/24/2021 1855   Code Status: DNR Family Communication: none at bedside  Level  of care: Telemetry Medical Status is: Inpatient  Remains inpatient appropriate  because:Ongoing diagnostic testing needed not appropriate for outpatient work up, Unsafe d/c plan, IV treatments appropriate due to intensity of illness or inability to take PO, and Inpatient level of care appropriate due to severity of illness  Dispo: The patient is from: Home              Anticipated d/c is to: Home              Patient currently is not medically stable to d/c.   Difficult to place patient No  Consultants:  Nephrology Cardiology/heart failure service -signed off 8/5 palliative  Procedures:  Tunneled HD catheter placement by IR on 8/4  Antimicrobials:  Cefazolin 8/4 - 8/4  Subjective:  No significant events as discussed with staff, patient herself unable to provide any complaints.  Objective: Vitals:   05/22/21 1235 05/22/21 1240 05/22/21 1300 05/22/21 1330  BP: 120/62 119/60 (!) 101/53 (!) 99/47  Pulse: 86 84 84 85  Resp: (!) 27     Temp: 97.6 F (36.4 C)     TempSrc: Axillary     SpO2: 100%     Weight: 47.2 kg     Height:        Intake/Output Summary (Last 24 hours) at 05/22/2021 1401 Last data filed at 05/22/2021 0806 Gross per 24 hour  Intake 50 ml  Output --  Net 50 ml   Filed Weights   05/21/21 0648 05/22/21 0412 05/22/21 1235  Weight: 50.5 kg 50.2 kg 47.2 kg    Examination:  Awake female frail, chronically ill-appearing, older than her stated age, in no apparent distress.  Confused today, Symmetrical Chest wall movement, Good air movement bilaterally, CTAB RRR,No Gallops,Rubs or new Murmurs, No Parasternal Heave +ve B.Sounds, Abd Soft, No tenderness, No rebound - guarding or rigidity. No Cyanosis, Clubbing or edema, No new Rash or bruise      Data Reviewed: I have personally reviewed following labs and imaging studies  CBC: Recent Labs  Lab 05/16/21 0317 05/18/21 0218 05/20/21 0051 05/22/21 0305  WBC 4.3 3.9* 5.8 8.7  HGB 7.9* 7.8* 8.5* 8.4*  HCT 25.8* 25.7* 28.4* 27.4*  MCV 107.1* 108.0* 109.2* 105.8*  PLT 65* 60* 140* [id number]    Basic Metabolic Panel: Recent Labs  Lab 05/16/21 0317 05/17/21 0258 05/18/21 0218 05/20/21 0051 05/21/21 0155 05/22/21 0305  NA 138 138 135 135 135 136  K 4.1 4.2 4.2 4.5 3.5 4.4  CL 86* 94* 97* 99 99 99  CO2 39* 34* '29 30 27 28  '$ GLUCOSE 158* 182* 111* 188* 112* 58*  BUN 94* 53* 29* 24* 12 32*  CREATININE 4.55* 3.04* 2.39* 3.56* 2.26* 3.88*  CALCIUM 8.9 8.7* 8.7* 9.1 8.5* 8.9  MG  --   --   --  2.2  --  2.2  PHOS 5.2*  --   --   --  2.6  --    GFR: Estimated Creatinine Clearance: 10.6 mL/min (A) (by C-G formula based on SCr of 3.88 mg/dL (H)). Liver Function Tests: Recent Labs  Lab 05/16/21 0317 05/21/21 0155 05/22/21 0305  AST  --   --  16  ALT  --   --  7  ALKPHOS  --   --  69  BILITOT  --   --  0.7  PROT  --   --  6.1*  ALBUMIN 3.3* 3.4* 3.3*   No results for input(s): LIPASE, AMYLASE in the last  168 hours. No results for input(s): AMMONIA in the last 168 hours. Coagulation Profile: No results for input(s): INR, PROTIME in the last 168 hours. Cardiac Enzymes: No results for input(s): CKTOTAL, CKMB, CKMBINDEX, TROPONINI in the last 168 hours. BNP (last 3 results) Recent Labs    12/06/20 0927  PROBNP >5000.0*   HbA1C: No results for input(s): HGBA1C in the last 72 hours. CBG: Recent Labs  Lab 05/21/21 1609 05/21/21 1957 05/22/21 0746 05/22/21 1024 05/22/21 1144  GLUCAP 122* 92 59* 111* 111*   Lipid Profile: No results for input(s): CHOL, HDL, LDLCALC, TRIG, CHOLHDL, LDLDIRECT in the last 72 hours. Thyroid Function Tests: No results for input(s): TSH, T4TOTAL, FREET4, T3FREE, THYROIDAB in the last 72 hours. Anemia Panel: No results for input(s): VITAMINB12, FOLATE, FERRITIN, TIBC, IRON, RETICCTPCT in the last 72 hours.  Sepsis Labs: No results for input(s): PROCALCITON, LATICACIDVEN in the last 168 hours.  No results found for this or any previous visit (from the past 240 hour(s)).        Radiology Studies: No results  found.      Scheduled Meds:  aspirin  81 mg Oral Daily   calcitRIOL  0.25 mcg Oral Daily   Chlorhexidine Gluconate Cloth  6 each Topical Q0600   Chlorhexidine Gluconate Cloth  6 each Topical Q0600   [START ON 05/24/2021] darbepoetin (ARANESP) injection - DIALYSIS  100 mcg Intravenous Q Fri-HD   insulin aspart  0-9 Units Subcutaneous TID WC   insulin detemir  6 Units Subcutaneous Daily   lactulose  10 g Oral Daily   midodrine  5 mg Oral TID WC   polyethylene glycol  17 g Oral Daily   sevelamer carbonate  800 mg Oral TID WC   Continuous Infusions:  anticoagulant sodium citrate       LOS: 11 days      Phillips Climes, DO [facility] Available via Epic secure chat 7am-7pm After these hours, please refer to coverage provider listed on [email or web address] 05/22/2021, 2:01 PM

## 2021-05-22 NOTE — Progress Notes (Signed)
Patient was seen and evaluated after hemodialysis, she is with shallow breathing, minimally responsive, she does appear to be with agonal breathing, her prognosis is very poor, discussed with husband, he is aware of poor prognosis, and is aware she is actively dying, at this point he expresses the goal is for patient to be comfortable, and to pass peacefully , she is transitioned to full comfort measures. Phillips Climes MD

## 2021-05-22 NOTE — Progress Notes (Addendum)
PT Cancellation Note  Patient Details Name: Beverly Martin MRN: [id number] DOB: April 14, 1956   Cancelled Treatment:    Reason Eval/Treat Not Completed: Medical issues which prohibited therapy- pt with continued medical decline and potential for comfort care. Will sign off. Please reconsult if new needs arise.  Mabeline Caras, PT, DPT Acute Rehabilitation Services  Pager [phone] Office [place] 05/22/2021, 10:41 AM

## 2021-05-22 NOTE — Plan of Care (Signed)
She has had more agonal respirations despite fluid removal with dialysis.  Her treatment was ended 15 minutes early.  I called her primary team, Dr. Waldron Labs, and he and I spoke with her husband in her room.  Her husband does not want her to suffer and we let her know that she was actively worsening despite aggressive care.  He expressed a goal to make her comfortable and to pass peacefully to be with the [place].  Her brother-in-law is at bedside and is supportive.    Team is changing code status to comfort care and placing comfort care orders.  She is going back to her room now.  Claudia Desanctis, MD 4:46 PM 05/22/2021

## 2021-05-22 NOTE — Progress Notes (Signed)
Chaplain was called by nurse to visit with patient's husband. Patient was having dialysis and was out of the room for several hours. Husband is experiencing anticipatory grief and is very emotional about losing his wife. They have been married 31 years. He says he tries to stay strong when he is around her but he feels alone in his grief. We talked about his church and the friends he has there and he says they will support him. He was very emotional and tearful. Chaplain listened to his story and offered encouragement. Chaplain prayed for both husband and patient and is available if needed again.    05/22/21 1300  Clinical Encounter Type  Visited With Family;Patient not available  Visit Type Follow-up;Spiritual support  Referral From Nurse  Consult/Referral To Chaplain  Spiritual Encounters  Spiritual Needs Prayer;Emotional  Stress Factors  Patient Stress Factors Major life changes

## 2021-05-22 NOTE — Progress Notes (Addendum)
Patient ID: Beverly Martin, female   DOB: 07/15/1956, 65 y.o.   MRN: [id number] [facility] Progress Note   Assessment/ Plan:   1. Acute kidney Injury on chronic kidney disease stage IV-likely progressed to ESRD: With history of underlying hypertension, diabetes and chronic cardiorenal syndrome with worsening renal function/diuretic resistance prompting need to start dialysis for uremia/volume.  Torsemide stopped as no urine output.   - HD today.  Reassess goals of care - Note palliative is following and assisting with goals of care - would transition off of morphine if aggressive treatment is still being pursued. Defer to primary team  - on midodrine now - change to NPO  2.  Acute exacerbation of combined diastolic/systolic congestive heart failure: Poor response to diuretic treatment earlier and now on hemodialysis for volume/management of azotemia with improving exam  3.  Metabolic alkalosis: Secondary to diuresis/contraction-improving with hemodialysis   4.  Anemia of chronic illness/chronic kidney disease: Significant iron deficiency seen on labs; s/p IV iron and on aranesp every friday. Improving on last check. Increase aranesp to 100 mcg every Friday.   5.  Chronic kidney disease/metabolic bone disease: hyperphos improved improved with HD and sevelamer 3 times daily AC.  Continue calcitriol for now   6.  Hypotension - hx HTN and now with hypotension and has been on midodrine.   7. AMS - not improved with HD; if no improvement after HD today and no other reversible causes identified would recommend not pursuing additional HD   Dispo - palliative following.  She has had a further decline in her mental status.  Goals of care per primary team and palliative.  Subjective:   Seen and examined on dialysis.  Blood pressure 98/46 and HR 88.  Increased UF goal 0.5 kg.  Tunn catheter in place.   Review of systems:  Unable to obtain 2/2 AMS     Objective:   BP (!) 98/45    Pulse 87   Temp 97.6 F (36.4 C) (Axillary)   Resp (!) 27   Ht '5\' 5"'$  (1.651 m)   Wt 47.2 kg   SpO2 100%   BMI 17.32 kg/m   Intake/Output Summary (Last 24 hours) at 05/22/2021 1420 Last data filed at 05/22/2021 [id number] Gross per 24 hour  Intake 50 ml  Output --  Net 50 ml   Weight change: -0.6 kg  Physical Exam:  General elderly female in bed less interactive on exam; nonverbal today HEENT normocephalic atraumatic extraocular movements intact sclera anicteric Neck supple trachea midline Lungs clear but reduced; shallow  Heart S1S2 no rub Abdomen soft nontender nondistended Extremities no edema  Psych normal mood and affect Neuro eyes open. Does not answer orienting questions or review of systems questions.  Nonverbal. Does not follow commands Access RIJ tunn catheter    Imaging: No results found.  Labs: BMET Recent Labs  Lab 05/16/21 0317 05/17/21 0258 05/18/21 0218 05/20/21 0051 05/21/21 0155 05/22/21 0305  NA 138 138 135 135 135 136  K 4.1 4.2 4.2 4.5 3.5 4.4  CL 86* 94* 97* 99 99 99  CO2 39* 34* '29 30 27 28  '$ GLUCOSE 158* 182* 111* 188* 112* 58*  BUN 94* 53* 29* 24* 12 32*  CREATININE 4.55* 3.04* 2.39* 3.56* 2.26* 3.88*  CALCIUM 8.9 8.7* 8.7* 9.1 8.5* 8.9  PHOS 5.2*  --   --   --  2.6  --    CBC Recent Labs  Lab 05/16/21 0317 05/18/21 0218 05/20/21 0051  05/22/21 0305  WBC 4.3 3.9* 5.8 8.7  HGB 7.9* 7.8* 8.5* 8.4*  HCT 25.8* 25.7* 28.4* 27.4*  MCV 107.1* 108.0* 109.2* 105.8*  PLT 65* 60* 140* 177    Medications:     aspirin  81 mg Oral Daily   calcitRIOL  0.25 mcg Oral Daily   Chlorhexidine Gluconate Cloth  6 each Topical Q0600   Chlorhexidine Gluconate Cloth  6 each Topical Q0600   [START ON 05/24/2021] darbepoetin (ARANESP) injection - DIALYSIS  100 mcg Intravenous Q Fri-HD   insulin aspart  0-9 Units Subcutaneous TID WC   insulin detemir  6 Units Subcutaneous Daily   lactulose  10 g Oral Daily   midodrine  5 mg Oral TID WC   polyethylene  glycol  17 g Oral Daily   sevelamer carbonate  800 mg Oral TID WC   Claudia Desanctis, MD 05/22/2021, Claudia Desanctis

## 2021-05-23 NOTE — Progress Notes (Signed)
PROGRESS NOTE    Beverly Martin  [id number] DOB: 09-15-1956 DOA: 05/11/2021 PCP: Billie Ruddy, MD    Brief Narrative:   Beverly Martin is a 65 year old female with past medical history significant for chronic systolic congestive heart failure, type 2 diabetes mellitus, CAD s/p CABG, essential hypertension, hyperlipidemia, CKD stage IV who presents to [facility] ED on 7/29 from PCP office with hyperkalemia and elevated creatinine.  Patient reports shortness of breath, generalized weakness, some chest tightness with increased lower extremity edema over the last week.    Recent TTE March 2022 with LVEF 20-25%.  In the ED, BP 145/74, HR 90, SPO2 within normal limits on 2 L nasal cannula.  Creatinine elevated 4.61 (3.59 on 7/19).  Chest x-ray with left greater than right small pleural effusions with basilar atelectasis.  EKG with normal sinus rhythm.  High sensitive troponin 46.  BNP 4500.  Glucose 238.  Cardiology and nephrology were consulted.  TRH consulted for further evaluation management of acute on chronic renal failure with decompensated systolic congestive heart failure, patient was started on hemodialysis, but extremely frail, tenuous to tolerate hemodialysis, had significant hypotension, and could not tolerate hemodialysis, all patient continues to have progressive decline, she is extremely frail, deconditioned, dative medicine were following closely, patient was transitioned to full comfort measures 8/10.   Assessment & Plan:   Principal Problem:   Hyperkalemia Active Problems:   Essential hypertension   Type 2 diabetes mellitus with hyperlipidemia (HCC)   CAD (coronary artery disease)   Elevated troponin   CKD (chronic kidney disease) stage 4, GFR 15-29 ml/min (HCC)   Chronic systolic heart failure (HCC)   Macrocytic anemia   Pressure injury of skin   AKI (acute kidney injury) (HCC)   Malnutrition of moderate degree   Acute renal failure on CKD stage IV/cardiorenal  syndrome Patient presenting from PCP office with elevated creatinine.  Creatinine 3.59 on 7/19  up to 4.61.  Etiology likely secondary to underlying history of hypertension, diabetes and chronic cardiorenal syndrome.  Underwent IR placement of tunneled dialysis catheter on 05/16/2021.   -Despite starting hemodialysis, patient continues to functional decline, failure to thrive, she has been difficult to dialyze secondary to altered mental status, generalized weakness and debility, and hypotension despite midodrine . -No unsure if patient can tolerate outpatient hemodialysis, please see discussion below under goals of care.  .   Acute on chronic combined systolic/diastolic congestive heart failure Essential hypertension now with hypotension Patient presenting with increased peripheral edema, elevated BNP 4500.  Etiology likely secondary to progressive renal failure, became  HD dependent as above.  Cardiology was initially consulted, signed off on 05/17/2021.  Discontinued isosorbide-hydralazine and torsemide due to hypotension.  Received IV albumin on 8/7. -Volume management with hemodialysis, which was limited due to soft blood pressure .  CAD s/p CABG: -Was treated with aspirin and statin  Type 2 diabetes mellitus Hemoglobin A1c 7.5 on 05/11/2021, fairly well controlled.  Failure to thrive/deconditioning  Goals of care: -Overall prognosis is very poor, and this significantly deconditioned, frail patient, with very poor tolerance to hemodialysis, palliative medicine input greatly appreciated, overall she continues to deteriorate despite initiating hemodialysis, after session yesterday she was with agonal breathing, significantly altered, discussed with husband and at this point family expresses wishes for patient to pass away comfortably, she is currently on full comfort measures .    DVT prophylaxis: Place and maintain sequential compression device Start: 05/16/21 1224   Code Status: DNR Family  Communication:  Brother and niece at bedside  Level of care: Telemetry Medical Status is: Inpatient  Remains inpatient appropriate because:Ongoing diagnostic testing needed not appropriate for outpatient work up, Unsafe d/c plan, IV treatments appropriate due to intensity of illness or inability to take PO, and Inpatient level of care appropriate due to severity of illness  Dispo: The patient is from: Home              Anticipated d/c is to:  Death expected during hospital stay              Patient currently is not medically stable to d/c.   Difficult to place patient No  Consultants:  Nephrology Cardiology/heart failure service -signed off 8/5 palliative  Procedures:  Tunneled HD catheter placement by IR on 8/4  Antimicrobials:  Cefazolin 8/4 - 8/4  Subjective:  Unable to provide any complaints, significantly altered and obtundent, no significant events as discussed with staff  Objective: Vitals:   05/22/21 1553 05/22/21 1630 05/22/21 2044 05/23/21 1226  BP: (!) 112/55 (!) 95/48 (!) 94/52 (!) 80/33  Pulse: 93 87 74 85  Resp:  '19 14 16  '$ Temp:  (!) 97 F (36.1 C) (!) 97.1 F (36.2 C) (!) 97.5 F (36.4 C)  TempSrc:  Oral Axillary Oral  SpO2:  100% (!) 84% 100%  Weight:  45.4 kg    Height:        Intake/Output Summary (Last 24 hours) at 05/23/2021 1333 Last data filed at 05/22/2021 1626 Gross per 24 hour  Intake --  Output 1847 ml  Net -1847 ml   Filed Weights   05/22/21 0412 05/22/21 1235 05/22/21 1630  Weight: 50.2 kg 47.2 kg 45.4 kg    Examination:  She is lethargic, weak, frail, deconditioned, minimally responsive , he is with shallow breathing and some use of accessory muscles .    Data Reviewed: I have personally reviewed following labs and imaging studies  CBC: Recent Labs  Lab 05/18/21 0218 05/20/21 0051 05/22/21 0305  WBC 3.9* 5.8 8.7  HGB 7.8* 8.5* 8.4*  HCT 25.7* 28.4* 27.4*  MCV 108.0* 109.2* 105.8*  PLT 60* 140* [id number]   Basic Metabolic  Panel: Recent Labs  Lab 05/17/21 0258 05/18/21 0218 05/20/21 0051 05/21/21 0155 05/22/21 0305  NA 138 135 135 135 136  K 4.2 4.2 4.5 3.5 4.4  CL 94* 97* 99 99 99  CO2 34* '29 30 27 28  '$ GLUCOSE 182* 111* 188* 112* 58*  BUN 53* 29* 24* 12 32*  CREATININE 3.04* 2.39* 3.56* 2.26* 3.88*  CALCIUM 8.7* 8.7* 9.1 8.5* 8.9  MG  --   --  2.2  --  2.2  PHOS  --   --   --  2.6  --    GFR: Estimated Creatinine Clearance: 10.2 mL/min (A) (by C-G formula based on SCr of 3.88 mg/dL (H)). Liver Function Tests: Recent Labs  Lab 05/21/21 0155 05/22/21 0305  AST  --  16  ALT  --  7  ALKPHOS  --  69  BILITOT  --  0.7  PROT  --  6.1*  ALBUMIN 3.4* 3.3*   No results for input(s): LIPASE, AMYLASE in the last 168 hours. No results for input(s): AMMONIA in the last 168 hours. Coagulation Profile: No results for input(s): INR, PROTIME in the last 168 hours. Cardiac Enzymes: No results for input(s): CKTOTAL, CKMB, CKMBINDEX, TROPONINI in the last 168 hours. BNP (last 3 results) Recent Labs    12/06/20 [phone]  PROBNP >5000.0*   HbA1C: No results for input(s): HGBA1C in the last 72 hours. CBG: Recent Labs  Lab 05/21/21 1609 05/21/21 1957 05/22/21 0746 05/22/21 1024 05/22/21 1144  GLUCAP 122* 92 59* 111* 111*   Lipid Profile: No results for input(s): CHOL, HDL, LDLCALC, TRIG, CHOLHDL, LDLDIRECT in the last 72 hours. Thyroid Function Tests: No results for input(s): TSH, T4TOTAL, FREET4, T3FREE, THYROIDAB in the last 72 hours. Anemia Panel: No results for input(s): VITAMINB12, FOLATE, FERRITIN, TIBC, IRON, RETICCTPCT in the last 72 hours.  Sepsis Labs: No results for input(s): PROCALCITON, LATICACIDVEN in the last 168 hours.  No results found for this or any previous visit (from the past 240 hour(s)).        Radiology Studies: No results found.      Scheduled Meds:  Chlorhexidine Gluconate Cloth  6 each Topical Q0600   Continuous Infusions:     LOS: 12 days       Phillips Climes, md [facility] Available via Epic secure chat 7am-7pm After these hours, please refer to coverage provider listed on [email or web address] 05/23/2021, 1:33 PM

## 2021-05-24 NOTE — Progress Notes (Signed)
Nutrition Brief Note  Chart reviewed. Pt now transitioning to comfort care.  No further nutrition interventions planned at this time.  Please re-consult as needed.   Nikia Mangino W, RD, LDN, CDCES Registered Dietitian II Certified Diabetes Care and Education Specialist Please refer to AMION for RD and/or RD on-call/weekend/after hours pager   

## 2021-05-24 NOTE — Progress Notes (Signed)
PROGRESS NOTE    Beverly Martin  [id number] DOB: [birth year] DOA: 04/17/2021 PCP: Billie Ruddy, MD    Brief Narrative:   Beverly Martin is a 65 year old female with past medical history significant for chronic systolic congestive heart failure, type 2 diabetes mellitus, CAD s/p CABG, essential hypertension, hyperlipidemia, CKD stage IV who presents to [facility] ED on 7/29 from PCP office with hyperkalemia and elevated creatinine.  Patient reports shortness of breath, generalized weakness, some chest tightness with increased lower extremity edema over the last week.    Recent TTE March 2022 with LVEF 20-25%.  In the ED, BP 145/74, HR 90, SPO2 within normal limits on 2 L nasal cannula.  Creatinine elevated 4.61 (3.59 on 7/19).  Chest x-ray with left greater than right small pleural effusions with basilar atelectasis.  EKG with normal sinus rhythm.  High sensitive troponin 46.  BNP 4500.  Glucose 238.  Cardiology and nephrology were consulted.  TRH consulted for further evaluation management of acute on chronic renal failure with decompensated systolic congestive heart failure, patient was started on hemodialysis, but extremely frail, tenuous to tolerate hemodialysis, had significant hypotension, and could not tolerate hemodialysis, all patient continues to have progressive decline, she is extremely frail, deconditioned, dative medicine were following closely, patient was transitioned to full comfort measures 8/10.   Assessment & Plan:   Principal Problem:   Hyperkalemia Active Problems:   Essential hypertension   Type 2 diabetes mellitus with hyperlipidemia (HCC)   CAD (coronary artery disease)   Elevated troponin   CKD (chronic kidney disease) stage 4, GFR 15-29 ml/min (HCC)   Chronic systolic heart failure (HCC)   Macrocytic anemia   Pressure injury of skin   AKI (acute kidney injury) (HCC)   Malnutrition of moderate degree   Acute renal failure on CKD stage IV/cardiorenal  syndrome Patient presenting from PCP office with elevated creatinine.  Creatinine 3.59 on 7/19  up to 4.61.  Etiology likely secondary to underlying history of hypertension, diabetes and chronic cardiorenal syndrome.  Underwent IR placement of tunneled dialysis catheter on 05/16/2021.   -Despite starting hemodialysis, patient continues to functional decline, failure to thrive, she has been difficult to dialyze secondary to altered mental status, generalized weakness and debility, and hypotension despite midodrine . -No unsure if patient can tolerate outpatient hemodialysis, please see discussion below under goals of care.  .   Acute on chronic combined systolic/diastolic congestive heart failure Essential hypertension now with hypotension Patient presenting with increased peripheral edema, elevated BNP 4500.  Etiology likely secondary to progressive renal failure, became  HD dependent as above.  Cardiology was initially consulted, signed off on 05/17/2021.  Discontinued isosorbide-hydralazine and torsemide due to hypotension.  Received IV albumin on 8/7. -Volume management with hemodialysis, which was limited due to soft blood pressure .  CAD s/p CABG: -Was treated with aspirin and statin  Type 2 diabetes mellitus Hemoglobin A1c 7.5 on 05/11/2021, fairly well controlled.  Failure to thrive/deconditioning  Goals of care: -Overall prognosis is very poor, and this significantly deconditioned, frail patient, with very poor tolerance to hemodialysis, palliative medicine input greatly appreciated, overall she continues to deteriorate despite initiating hemodialysis, after session yesterday she was with agonal breathing, significantly altered, discussed with husband and at this point family expresses wishes for patient to pass away comfortably, she is currently on full comfort measures .    DVT prophylaxis: Comfort    Code Status: DNR Family Communication: Husband  at bedside  Level of care:  Telemetry Medical Status is: Inpatient  Remains inpatient appropriate because:Ongoing diagnostic testing needed not appropriate for outpatient work up, Unsafe d/c plan, IV treatments appropriate due to intensity of illness or inability to take PO, and Inpatient level of care appropriate due to severity of illness  Dispo: The patient is from: Home              Anticipated d/c is to:  Death expected during hospital stay              Patient currently is not medically stable to d/c.   Difficult to place patient No  Consultants:  Nephrology Cardiology/heart failure service -signed off 8/5 palliative  Procedures:  Tunneled HD catheter placement by IR on 8/4  Antimicrobials:  Cefazolin 8/4 - 8/4  Subjective:  No significant events overnight as discussed with staff  Objective: Vitals:   05/22/21 1630 05/22/21 2044 05/23/21 1226 05/23/21 2000  BP: (!) 95/48 (!) 94/52 (!) 80/33 (!) 87/49  Pulse: 87 74 85 70  Resp: '19 14 16 10  '$ Temp: (!) 97 F (36.1 C) (!) 97.1 F (36.2 C) (!) 97.5 F (36.4 C) (!) 96.7 F (35.9 C)  TempSrc: Oral Axillary Oral Axillary  SpO2: 100% (!) 84% 100%   Weight: 45.4 kg     Height:       No intake or output data in the 24 hours ending 05/24/21 1350  Filed Weights   05/22/21 0412 05/22/21 1235 05/22/21 1630  Weight: 50.2 kg 47.2 kg 45.4 kg    Examination:  unresponsive, frail, she is with agonal shallow breathing.  . .    Data Reviewed: I have personally reviewed following labs and imaging studies  CBC: Recent Labs  Lab 05/18/21 0218 05/20/21 0051 05/22/21 0305  WBC 3.9* 5.8 8.7  HGB 7.8* 8.5* 8.4*  HCT 25.7* 28.4* 27.4*  MCV 108.0* 109.2* 105.8*  PLT 60* 140* [id number]   Basic Metabolic Panel: Recent Labs  Lab 05/18/21 0218 05/20/21 0051 05/21/21 0155 05/22/21 0305  NA 135 135 135 136  K 4.2 4.5 3.5 4.4  CL 97* 99 99 99  CO2 '29 30 27 28  '$ GLUCOSE 111* 188* 112* 58*  BUN 29* 24* 12 32*  CREATININE 2.39* 3.56* 2.26* 3.88*   CALCIUM 8.7* 9.1 8.5* 8.9  MG  --  2.2  --  2.2  PHOS  --   --  2.6  --    GFR: Estimated Creatinine Clearance: 10.2 mL/min (A) (by C-G formula based on SCr of 3.88 mg/dL (H)). Liver Function Tests: Recent Labs  Lab 05/21/21 0155 05/22/21 0305  AST  --  16  ALT  --  7  ALKPHOS  --  69  BILITOT  --  0.7  PROT  --  6.1*  ALBUMIN 3.4* 3.3*   No results for input(s): LIPASE, AMYLASE in the last 168 hours. No results for input(s): AMMONIA in the last 168 hours. Coagulation Profile: No results for input(s): INR, PROTIME in the last 168 hours. Cardiac Enzymes: No results for input(s): CKTOTAL, CKMB, CKMBINDEX, TROPONINI in the last 168 hours. BNP (last 3 results) Recent Labs    12/06/20 0927  PROBNP >5000.0*   HbA1C: No results for input(s): HGBA1C in the last 72 hours. CBG: Recent Labs  Lab 05/21/21 1609 05/21/21 1957 05/22/21 0746 05/22/21 1024 05/22/21 1144  GLUCAP 122* 92 59* 111* 111*   Lipid Profile: No results for input(s): CHOL, HDL, LDLCALC, TRIG, CHOLHDL, LDLDIRECT in the last 72 hours. Thyroid Function  Tests: No results for input(s): TSH, T4TOTAL, FREET4, T3FREE, THYROIDAB in the last 72 hours. Anemia Panel: No results for input(s): VITAMINB12, FOLATE, FERRITIN, TIBC, IRON, RETICCTPCT in the last 72 hours.  Sepsis Labs: No results for input(s): PROCALCITON, LATICACIDVEN in the last 168 hours.  No results found for this or any previous visit (from the past 240 hour(s)).        Radiology Studies: No results found.      Scheduled Meds:  Chlorhexidine Gluconate Cloth  6 each Topical Q0600   Continuous Infusions:     LOS: 13 days      Phillips Climes, md [facility] Available via Epic secure chat 7am-7pm After these hours, please refer to coverage provider listed on [email or web address] 05/24/2021, 1:50 PM

## 2021-05-29 DIAGNOSIS — I5023 Acute on chronic systolic (congestive) heart failure: Secondary | ICD-10-CM | POA: Diagnosis not present

## 2021-06-12 ENCOUNTER — Telehealth: Payer: Self-pay

## 2021-06-12 NOTE — Telephone Encounter (Signed)
Beverly Martin from [facility] called asking for the documents faxed to PCP to be signed and faxed back.

## 2021-06-13 NOTE — Progress Notes (Signed)
Comfort care pt passed at 0824. Verified with Elaina Pattee, RN. Dr Lupita Leash notified. Pt's husband aware,at bedside and contacting other family members.

## 2021-06-13 NOTE — Death Summary Note (Signed)
DEATH SUMMARY   Patient Details  Name: Beverly Martin MRN: [id number] DOB: 1955/10/31  Admission/Discharge Information   Admit Date:  2021-06-07  Date of Death: Date of Death: 06/22/2021  Time of Death: Time of Death: 0824  Length of Stay: Jan 22, 2023  Referring Physician: Billie Ruddy, MD   Reason(s) for Hospitalization  Renal failure  Diagnoses  Preliminary cause of death:   Acute on chronic Renal failure  Acute renal failure on CKD stage IV/cardiorenal syndrome: Status post dialysis, extremely weak and frail and also with hypotension despite midodrine unable to tolerate dialysis. Acute on chronic combined systolic/diastolic congestive heart failure Essential hypertension history Hypotension persistent CAD s/p CABG Elevated troponin Hyperkalemia Anemia Type 2 diabetes mellitus Failure to thrive/deconditioning/generalized weakness Moderate protein calorie malnutrition Goals of care/end-of-life care patient was transitioned to comfort abrades 8/10   Brief Hospital Course (including significant findings, care, treatment, and services provided and events leading to death)  Beverly Martin is a 65 y.o. year old female with multiple comorbidities including chronic systolic CHF with LVEF 0000000 01-26-21, T2DM CAD status post CABG, hypertension, hyperlipidemia, CKD stage IV presented to [facility] ED on 08-Jun-2023 from PCP office with hyperkalemia and elevated creatinine the patient was complaining of shortness of breath, generalized weakness, chest tightness and lower extremity edema over the past week prior to admission..  Patient reports shortness of breath, generalized weakness, some chest tightness with increased lower extremity edema over the last week.   In the ED, BP 145/74, HR 90, SPO2 within normal limits on 2 L nasal cannula.  Creatinine elevated 4.61 (3.59 on 7/19).  Chest x-ray with left greater than right small pleural effusions with basilar atelectasis.  EKG with normal sinus rhythm.  High  sensitive troponin 46.  BNP 4500.  Glucose 238.  Cardiology and nephrology were consulted.  TRH consulted   Patient was admitted for acute on chronic renal failure with decompensated systolic congestive heart failure, patient was started on hemodialysis, but extremely frail, tenuous to tolerate hemodialysis, had significant hypotension, and could not tolerate hemodialysis Patient continued to have progressive decline, she was extremely frail, deconditioned, seen by palliative care.   After extensive family meeting further discussion patient was transitioned to full comfort measures 8/10 expecting hospital death. Patient passed away 2023/06/23 in am prior to be seen by me  Pertinent Labs and Studies  Significant Diagnostic Studies IR Fluoro Guide CV Line Right  Result Date: 05/16/2021 INDICATION: Progressive renal failure EXAM: Ultrasound fluoroscopy guided tunneled dialysis catheter placement MEDICATIONS: Ancef 2 g IV; The antibiotic was administered within an appropriate time interval prior to skin puncture. ANESTHESIA/SEDATION: Moderate (conscious) sedation was employed during this procedure. A total of Versed 0.5 mg was administered intravenously. Moderate Sedation Time: 12 minutes. The patient's level of consciousness and vital signs were monitored continuously by radiology nursing throughout the procedure under my direct supervision. FLUOROSCOPY TIME:  Fluoroscopy Time: 1.1 minutes (2.9 mGy). COMPLICATIONS: None immediate. PROCEDURE: Informed written consent was obtained from the patient after a thorough discussion of the procedural risks, benefits and alternatives. All questions were addressed. Maximal Sterile Barrier Technique was utilized including caps, mask, sterile gowns, sterile gloves, sterile drape, hand hygiene and skin antiseptic. A timeout was performed prior to the initiation of the procedure. The right internal jugular vein was evaluated with ultrasound and shown to be patent. A permanent  ultrasound image was obtained and placed in the patient's medical record. Using sterile gel and a sterile probe cover, the right internal jugular vein  was entered with a 21 ga needle during real time ultrasound guidance. 0.018 inch guidewire placed and 21 ga needle exchanged for transitional dilator set. Utilizing fluoroscopy, 0.035 inch guidewire advanced through the needle without difficulty. Seriel dilation was performed and peel-away sheath was placed. Attention then turned to the right anterior upper chest. Following local lidocaine administration, the hemodialysis catheter was tunneled from the chest wall to the venotomy site. The catheter was inserted through the peel-away sheath. The tip of the catheter was positioned within the right atrium using fluoroscopic guidance. All lumens of the catheter aspirated and flushed well. The dialysis lumens were locked with Heparin. The catheter was secured to the skin with suture. The insertion site was covered with a Biopatch and sterile dressing. IMPRESSION: Tunneled right IJ hemodialysis catheter ready for use. Electronically Signed   By: Miachel Roux M.D.   On: 05/16/2021 12:05   IR US Guide Vasc Access Right  Result Date: 05/16/2021 INDICATION: Progressive renal failure EXAM: Ultrasound fluoroscopy guided tunneled dialysis catheter placement MEDICATIONS: Ancef 2 g IV; The antibiotic was administered within an appropriate time interval prior to skin puncture. ANESTHESIA/SEDATION: Moderate (conscious) sedation was employed during this procedure. A total of Versed 0.5 mg was administered intravenously. Moderate Sedation Time: 12 minutes. The patient's level of consciousness and vital signs were monitored continuously by radiology nursing throughout the procedure under my direct supervision. FLUOROSCOPY TIME:  Fluoroscopy Time: 1.1 minutes (2.9 mGy). COMPLICATIONS: None immediate. PROCEDURE: Informed written consent was obtained from the patient after a thorough  discussion of the procedural risks, benefits and alternatives. All questions were addressed. Maximal Sterile Barrier Technique was utilized including caps, mask, sterile gowns, sterile gloves, sterile drape, hand hygiene and skin antiseptic. A timeout was performed prior to the initiation of the procedure. The right internal jugular vein was evaluated with ultrasound and shown to be patent. A permanent ultrasound image was obtained and placed in the patient's medical record. Using sterile gel and a sterile probe cover, the right internal jugular vein was entered with a 21 ga needle during real time ultrasound guidance. 0.018 inch guidewire placed and 21 ga needle exchanged for transitional dilator set. Utilizing fluoroscopy, 0.035 inch guidewire advanced through the needle without difficulty. Seriel dilation was performed and peel-away sheath was placed. Attention then turned to the right anterior upper chest. Following local lidocaine administration, the hemodialysis catheter was tunneled from the chest wall to the venotomy site. The catheter was inserted through the peel-away sheath. The tip of the catheter was positioned within the right atrium using fluoroscopic guidance. All lumens of the catheter aspirated and flushed well. The dialysis lumens were locked with Heparin. The catheter was secured to the skin with suture. The insertion site was covered with a Biopatch and sterile dressing. IMPRESSION: Tunneled right IJ hemodialysis catheter ready for use. Electronically Signed   By: Miachel Roux M.D.   On: 05/16/2021 12:05   DG CHEST PORT 1 VIEW  Result Date: 05/18/2021 CLINICAL DATA:  Shortness of breath EXAM: PORTABLE CHEST 1 VIEW COMPARISON:  04/26/2021 FINDINGS: Cardiac shadow remains enlarged. Postsurgical changes are noted. New right-sided jugular dialysis catheter is seen in satisfactory position. Chronic blunting of the costophrenic angles is again noted and stable. Mild central vascular congestion is  seen. No acute infiltrate is noted. No bony abnormality is seen. IMPRESSION: Mild vascular congestion. Chronic blunting of the costophrenic angle stable from previous exam. Electronically Signed   By: Inez Catalina M.D.   On: 05/18/2021 20:54  DG Chest Portable 1 View  Result Date: 05/07/2021 CLINICAL DATA:  Chest tightness. EXAM: PORTABLE CHEST 1 VIEW COMPARISON:  PA and lateral chest 04/23/2021. FINDINGS: There is cardiomegaly without edema. Small bilateral pleural effusions and basilar atelectasis are worse on the left and unchanged. No pneumothorax. The patient is status post CABG. No acute or focal abnormality. IMPRESSION: No change in left greater than right small pleural effusions and basilar atelectasis. Cardiomegaly without edema. Electronically Signed   By: Inge Rise M.D.   On: 04/21/2021 17:48   ECHOCARDIOGRAM COMPLETE  Result Date: 04/25/2021    ECHOCARDIOGRAM REPORT   Patient Name:   Beverly Martin Date of Exam: 04/25/2021 Medical Rec #:  [id number]      Height:       64.0 in Accession #:    [id number]     Weight:       118.1 lb Date of Birth:  [birth year]      BSA:          1.564 m Patient Age:    68 years       BP:           140/73 mmHg Patient Gender: F              HR:           98 bpm. Exam Location:  Inpatient Procedure: 2D Echo, Cardiac Doppler and Color Doppler Indications:    CHF-Acute Systolic [id number]  History:        Patient has prior history of Echocardiogram examinations, most                 recent 01/03/2021. CHF, CAD; Risk Factors:Diabetes, Hypertension                 and Dyslipidemia.  Sonographer:    Bernadene Person RDCS Referring Phys: [id number] [place]  1. Left ventricular ejection fraction, by estimation, is 30 to 35%. The left ventricle has moderately decreased function. The left ventricle demonstrates global hypokinesis. There is mild left ventricular hypertrophy. Left ventricular diastolic parameters are consistent with Grade II diastolic dysfunction  (pseudonormalization).  2. Right ventricular systolic function is moderately reduced. The right ventricular size is mildly enlarged. There is moderately elevated pulmonary artery systolic pressure. The estimated right ventricular systolic pressure is 0000000 mmHg.  3. Left atrial size was moderately dilated.  4. The mitral valve is normal in structure. Trivial mitral valve regurgitation. No evidence of mitral stenosis.  5. The aortic valve is tricuspid. Aortic valve regurgitation is not visualized. No aortic stenosis is present.  6. The inferior vena cava is normal in size with <50% respiratory variability, suggesting right atrial pressure of 8 mmHg. FINDINGS  Left Ventricle: Left ventricular ejection fraction, by estimation, is 30 to 35%. The left ventricle has moderately decreased function. The left ventricle demonstrates global hypokinesis. The left ventricular internal cavity size was normal in size. There is mild left ventricular hypertrophy. Left ventricular diastolic parameters are consistent with Grade II diastolic dysfunction (pseudonormalization). Right Ventricle: The right ventricular size is mildly enlarged. No increase in right ventricular wall thickness. Right ventricular systolic function is moderately reduced. There is moderately elevated pulmonary artery systolic pressure. The tricuspid regurgitant velocity is 3.39 m/s, and with an assumed right atrial pressure of 8 mmHg, the estimated right ventricular systolic pressure is 0000000 mmHg. Left Atrium: Left atrial size was moderately dilated. Right Atrium: Right atrial size was normal in size. Pericardium: There is no evidence of  pericardial effusion. Mitral Valve: The mitral valve is normal in structure. Trivial mitral valve regurgitation. No evidence of mitral valve stenosis. Tricuspid Valve: The tricuspid valve is normal in structure. Tricuspid valve regurgitation is mild. Aortic Valve: The aortic valve is tricuspid. Aortic valve regurgitation is not  visualized. No aortic stenosis is present. Pulmonic Valve: The pulmonic valve was normal in structure. Pulmonic valve regurgitation is mild. Aorta: The aortic root is normal in size and structure. Venous: The inferior vena cava is normal in size with less than 50% respiratory variability, suggesting right atrial pressure of 8 mmHg. IAS/Shunts: No atrial level shunt detected by color flow Doppler.  LEFT VENTRICLE PLAX 2D LVIDd:         4.80 cm      Diastology LVIDs:         3.90 cm      LV e' medial:    3.26 cm/s LV PW:         1.30 cm      LV E/e' medial:  31.0 LV IVS:        0.90 cm      LV e' lateral:   4.39 cm/s LVOT diam:     1.70 cm      LV E/e' lateral: 23.0 LV SV:         43 LV SV Index:   27 LVOT Area:     2.27 cm  LV Volumes (MOD) LV vol d, MOD A2C: 106.0 ml LV vol d, MOD A4C: 97.9 ml LV vol s, MOD A2C: 69.7 ml LV vol s, MOD A4C: 67.4 ml LV SV MOD A2C:     36.3 ml LV SV MOD A4C:     97.9 ml LV SV MOD BP:      35.7 ml RIGHT VENTRICLE RV S prime:     6.47 cm/s TAPSE (M-mode): 1.2 cm LEFT ATRIUM             Index       RIGHT ATRIUM           Index LA diam:        3.70 cm 2.37 cm/m  RA Area:     12.80 cm LA Vol (A2C):   68.1 ml 43.54 ml/m RA Volume:   30.00 ml  19.18 ml/m LA Vol (A4C):   68.4 ml 43.74 ml/m LA Biplane Vol: 69.9 ml 44.69 ml/m  AORTIC VALVE LVOT Vmax:   96.80 cm/s LVOT Vmean:  70.800 cm/s LVOT VTI:    0.188 m  AORTA Ao Root diam: 2.60 cm Ao Asc diam:  2.80 cm MITRAL VALVE                TRICUSPID VALVE MV Area (PHT): 3.99 cm     TR Peak grad:   46.0 mmHg MV Decel Time: 190 msec     TR Vmax:        339.00 cm/s MV E velocity: 101.00 cm/s MV A velocity: 92.60 cm/s   SHUNTS MV E/A ratio:  1.09         Systemic VTI:  0.19 m                             Systemic Diam: 1.70 cm Loralie Champagne MD Electronically signed by Loralie Champagne MD Signature Date/Time: 04/25/2021/5:10:32 PM    Final    VAS [place] UPPER EXT VEIN MAPPING (PRE-OP AVF)  Result Date: 04/30/2021 UPPER EXTREMITY VEIN MAPPING Patient  Name:  Beverly Martin  Date of Exam:   04/30/2021 Medical Rec #: [id number]       Accession #:    [id number] Date of Birth: 1955-10-25       Patient Gender: F Patient Age:   65Y Exam Location:  [facility] Procedure:      VAS [place] UPPER EXT VEIN MAPPING (PRE-OP AVF) Referring Phys: [id number] DANIEL R BENSIMHON --------------------------------------------------------------------------------  Indications: History of PAD; patient is pre-operative for bypass. Comparison Study: No prior studies. Performing Technologist: Oliver Hum RVT  Examination Guidelines: A complete evaluation includes B-mode imaging, spectral Doppler, color Doppler, and power Doppler as needed of all accessible portions of each vessel. Bilateral testing is considered an integral part of a complete examination. Limited examinations for reoccurring indications may be performed as noted. +-----------------+-------------+----------+--------+ Right Cephalic   Diameter (cm)Depth (cm)Findings +-----------------+-------------+----------+--------+ Shoulder             0.16        0.63            +-----------------+-------------+----------+--------+ Prox upper arm       0.10        0.71            +-----------------+-------------+----------+--------+ Mid upper arm        0.09        0.45            +-----------------+-------------+----------+--------+ Dist upper arm       0.07        0.25            +-----------------+-------------+----------+--------+ Antecubital fossa    0.14        0.24            +-----------------+-------------+----------+--------+ Prox forearm         0.10        0.55            +-----------------+-------------+----------+--------+ Mid forearm          0.14        0.22            +-----------------+-------------+----------+--------+ Dist forearm         0.15        0.26            +-----------------+-------------+----------+--------+  +-----------------+-------------+----------+--------------+ Right Basilic    Diameter (cm)Depth (cm)   Findings    +-----------------+-------------+----------+--------------+ Shoulder             0.10        0.60                  +-----------------+-------------+----------+--------------+ Dist upper arm       0.17        0.73                  +-----------------+-------------+----------+--------------+ Antecubital fossa    0.15        0.58                  +-----------------+-------------+----------+--------------+ Prox forearm         0.06        0.27                  +-----------------+-------------+----------+--------------+ Mid forearm                             not visualized +-----------------+-------------+----------+--------------+ Distal forearm  not visualized +-----------------+-------------+----------+--------------+ +-----------------+-------------+----------+--------+ Left Cephalic    Diameter (cm)Depth (cm)Findings +-----------------+-------------+----------+--------+ Shoulder             0.25        0.55            +-----------------+-------------+----------+--------+ Prox upper arm       0.16        0.72            +-----------------+-------------+----------+--------+ Mid upper arm        0.09        0.50            +-----------------+-------------+----------+--------+ Dist upper arm       0.07        0.46            +-----------------+-------------+----------+--------+ Antecubital fossa    0.26        0.13   Thrombus +-----------------+-------------+----------+--------+ Prox forearm         0.06        0.30            +-----------------+-------------+----------+--------+ Mid forearm          0.12        0.23            +-----------------+-------------+----------+--------+ Dist forearm         0.05        0.38            +-----------------+-------------+----------+--------+  +-----------------+-------------+----------+--------------+ Left Basilic     Diameter (cm)Depth (cm)   Findings    +-----------------+-------------+----------+--------------+ Shoulder             0.17        0.88                  +-----------------+-------------+----------+--------------+ Prox upper arm       0.19        0.78                  +-----------------+-------------+----------+--------------+ Mid upper arm        0.15        0.80                  +-----------------+-------------+----------+--------------+ Dist upper arm       0.13        0.71                  +-----------------+-------------+----------+--------------+ Antecubital fossa    0.11        0.66                  +-----------------+-------------+----------+--------------+ Prox forearm         0.05        0.25                  +-----------------+-------------+----------+--------------+ Mid forearm                             not visualized +-----------------+-------------+----------+--------------+ Distal forearm                          not visualized +-----------------+-------------+----------+--------------+ *See table(s) above for measurements and observations.  Diagnosing physician: Deitra Mayo MD Electronically signed by Deitra Mayo MD on 04/30/2021 at 5:14:21 PM.    Final     Microbiology No results found for this or any previous visit (from the past 240 hour(s)).  Lab Basic Metabolic  Panel: Recent Labs  Lab 05/20/21 0051 05/21/21 0155 05/22/21 0305  NA 135 135 136  K 4.5 3.5 4.4  CL 99 99 99  CO2 '30 27 28  '$ GLUCOSE 188* 112* 58*  BUN 24* 12 32*  CREATININE 3.56* 2.26* 3.88*  CALCIUM 9.1 8.5* 8.9  MG 2.2  --  2.2  PHOS  --  2.6  --    Liver Function Tests: Recent Labs  Lab 05/21/21 0155 05/22/21 0305  AST  --  16  ALT  --  7  ALKPHOS  --  69  BILITOT  --  0.7  PROT  --  6.1*  ALBUMIN 3.4* 3.3*   No results for input(s): LIPASE, AMYLASE in the last  168 hours. No results for input(s): AMMONIA in the last 168 hours. CBC: Recent Labs  Lab 05/20/21 0051 05/22/21 0305  WBC 5.8 8.7  HGB 8.5* 8.4*  HCT 28.4* 27.4*  MCV 109.2* 105.8*  PLT 140* 177   Cardiac Enzymes: No results for input(s): CKTOTAL, CKMB, CKMBINDEX, TROPONINI in the last 168 hours. Sepsis Labs: Recent Labs  Lab 05/20/21 0051 05/22/21 0305  WBC 5.8 8.7    Procedures/Operations     Kayah Hecker [place] 09-Jun-2021, 11:48 AM

## 2021-06-13 NOTE — Progress Notes (Signed)
Chaplain happened to see pt's husband in the hallway.  When husband recognized Chaplain form an earlier visit he burst into tears reporting his wife had passed.  Chaplain and a Chartered certified accountant ministered to husband o/s pts room.  When husband was ready to leave, Chaplain escorted him to the front entrance.  Beverly Martin

## 2021-06-13 DEATH — deceased

## 2021-06-14 NOTE — Telephone Encounter (Signed)
Paperwork was completed and faxed on 9/01.

## 2021-07-27 ENCOUNTER — Other Ambulatory Visit: Payer: Self-pay | Admitting: Family Medicine

## 2022-04-27 IMAGING — DX DG CHEST 2V
2 series · 2 of 2 positions shown · non-contrast
Comparison: Choose 12/02/2018

CLINICAL DATA: Chest pain

EXAM:
CHEST - 2 VIEW

[chest lat]
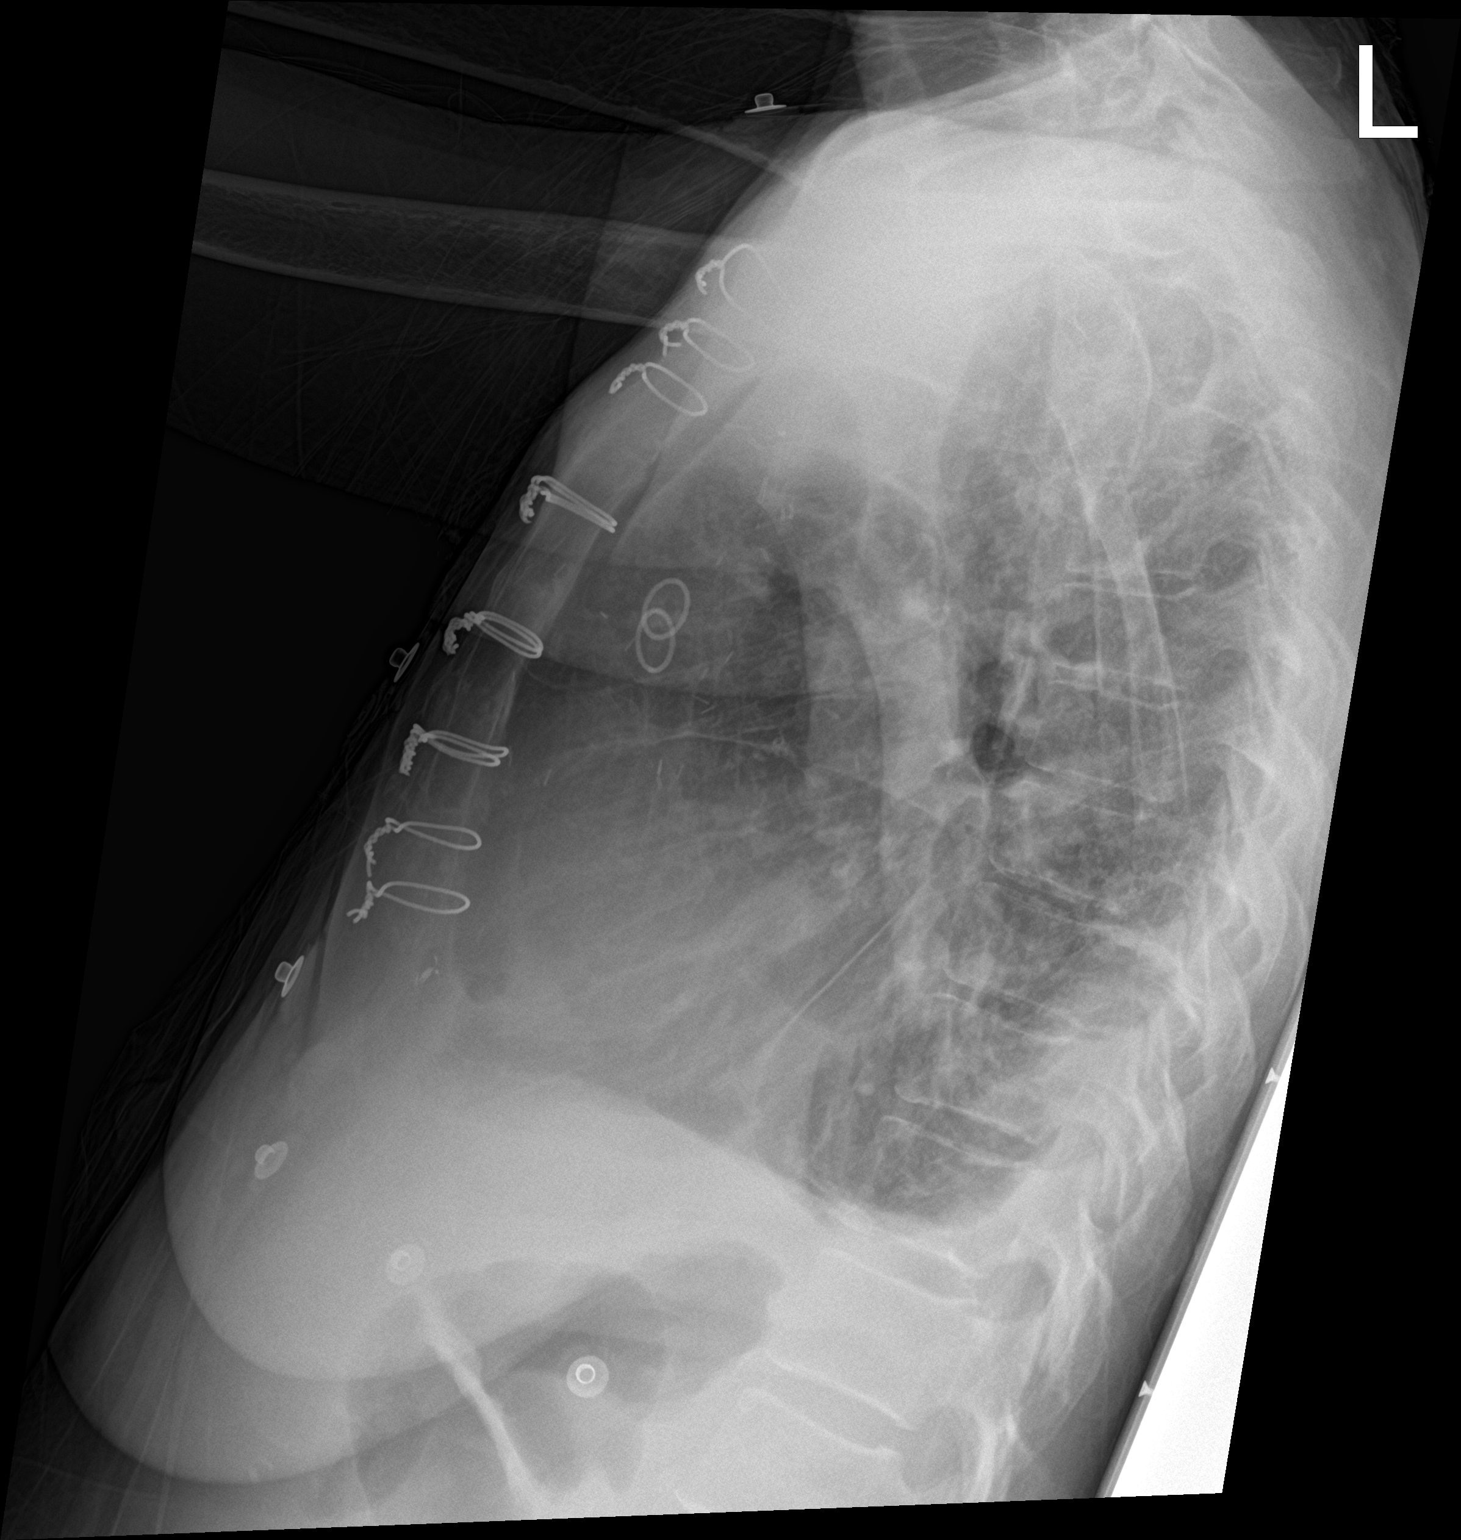

[chest ap]
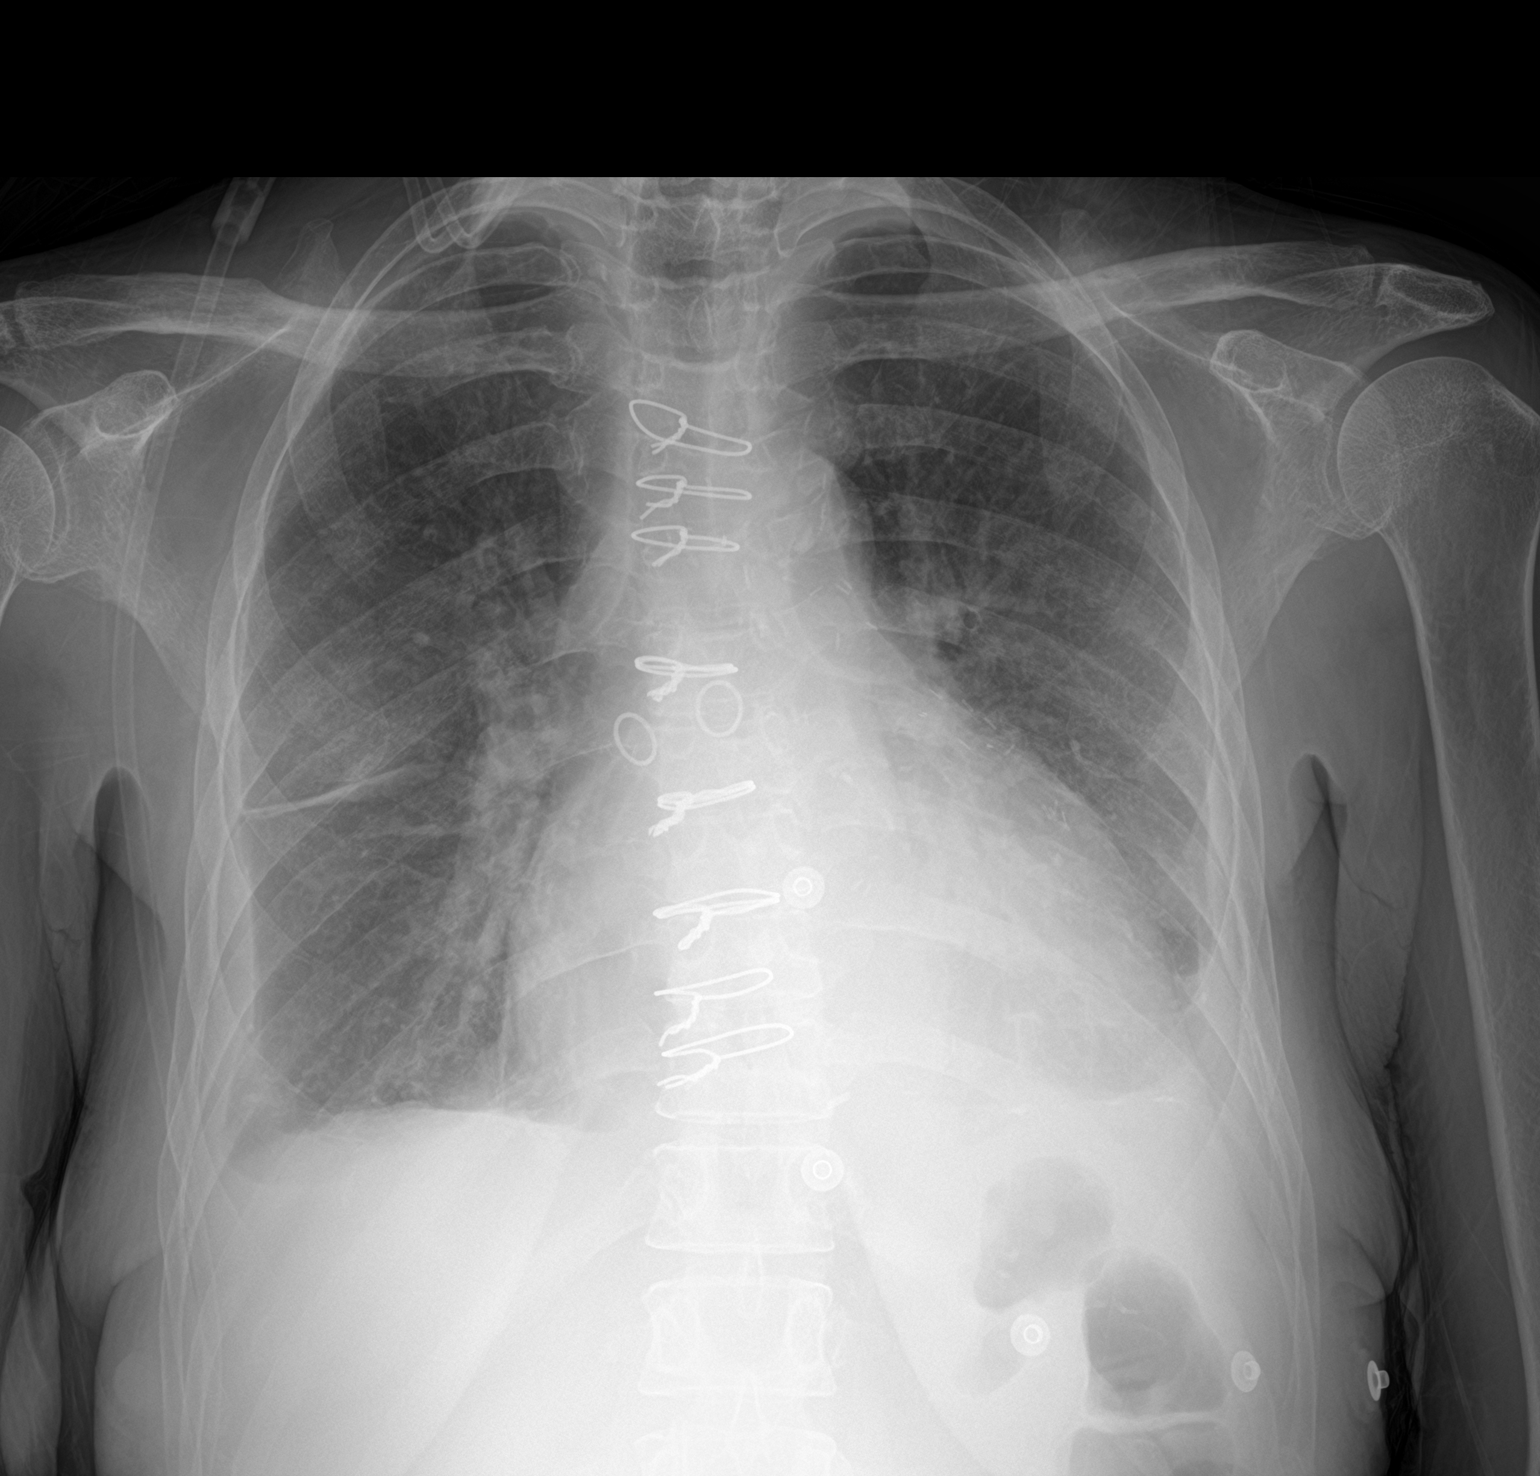

[2 of 2 positions shown; findings below may reference images not displayed]

FINDINGS: Prior median sternotomy and CABG. Similar cardiomegaly. Aortic
atherosclerosis. Similar size of the small bilateral pleural
effusions with adjacent atelectasis. Mild diffuse interstitial
opacities. The visualized skeletal structures is unchanged.
IMPRESSION: Cardiomegaly with interstitial edema and stable small bilateral
pleural effusions.

## 2022-04-28 IMAGING — US US RENAL
1 series · 14 of 25 positions shown · non-contrast
Comparison: None.

CLINICAL DATA: 65-year-old female with acute renal insufficiency.

EXAM:
RENAL / URINARY TRACT ULTRASOUND COMPLETE

[Series 1: us renal · 39 acquisitions, 14 frames shown]
[im 1/39]
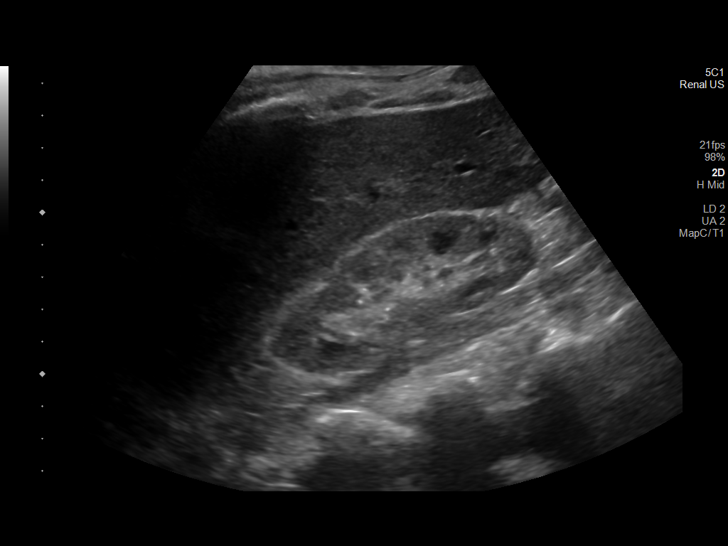
[im 4/39]
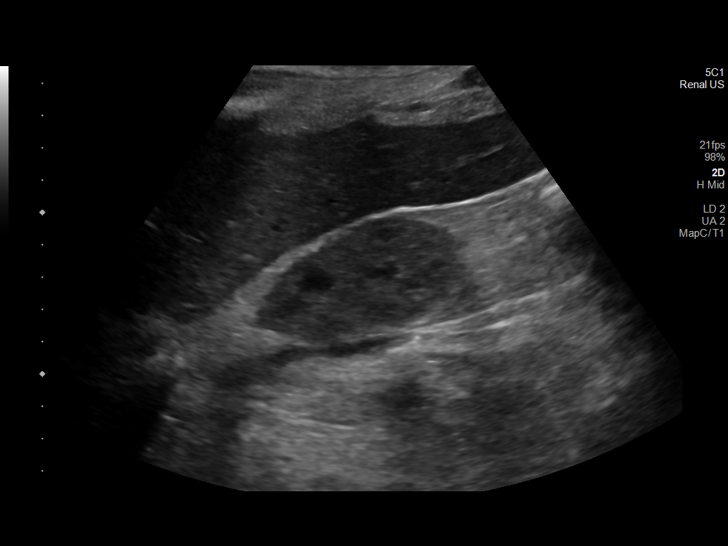
[im 7/39]
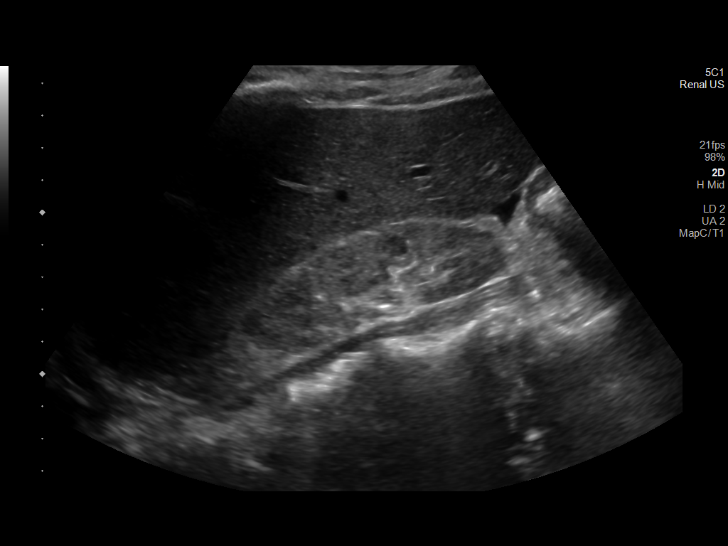
[im 10/39]
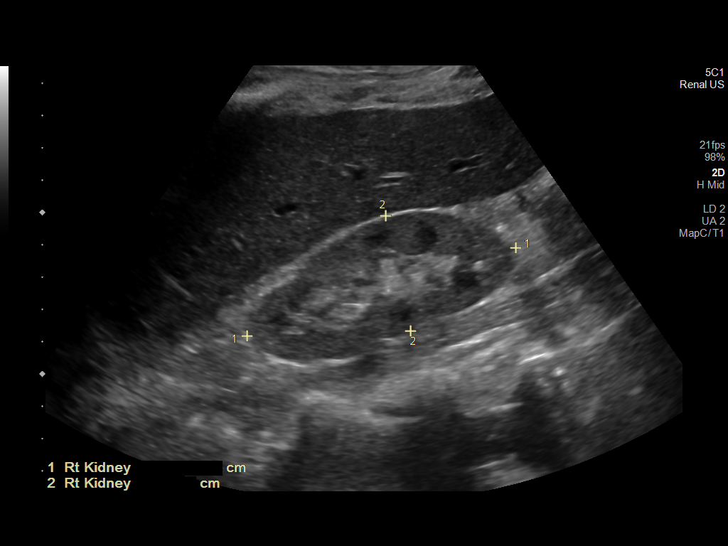
[im 13/39]
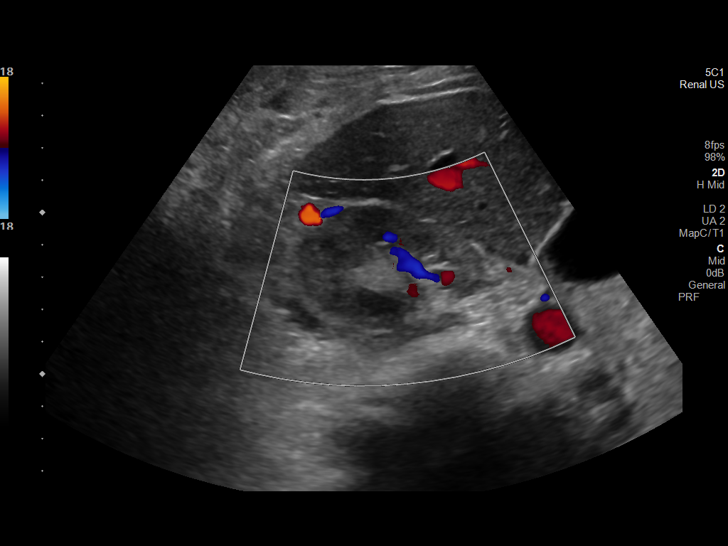
[im 15/39]
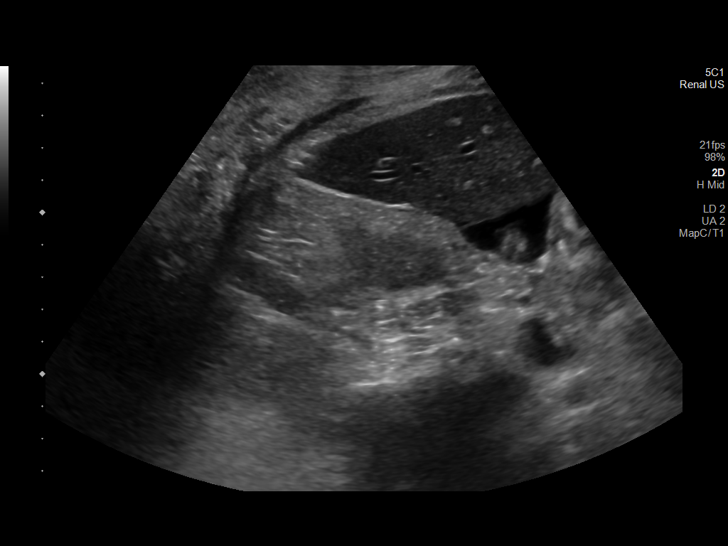
[im 18/39]
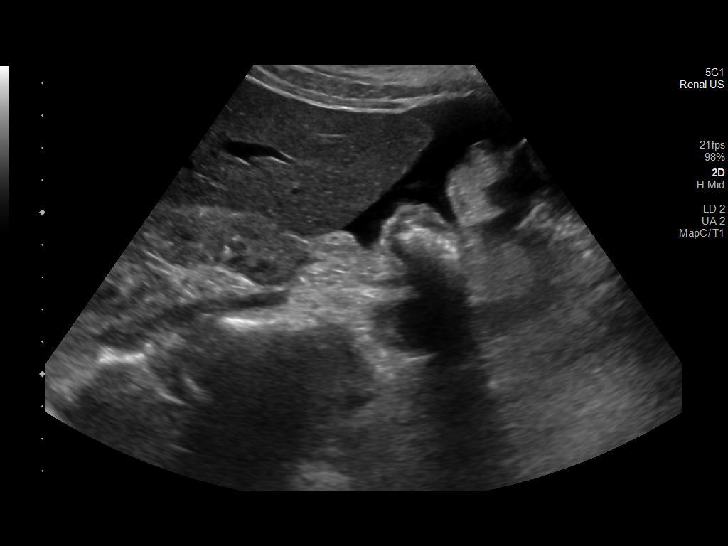
[im 21/39]
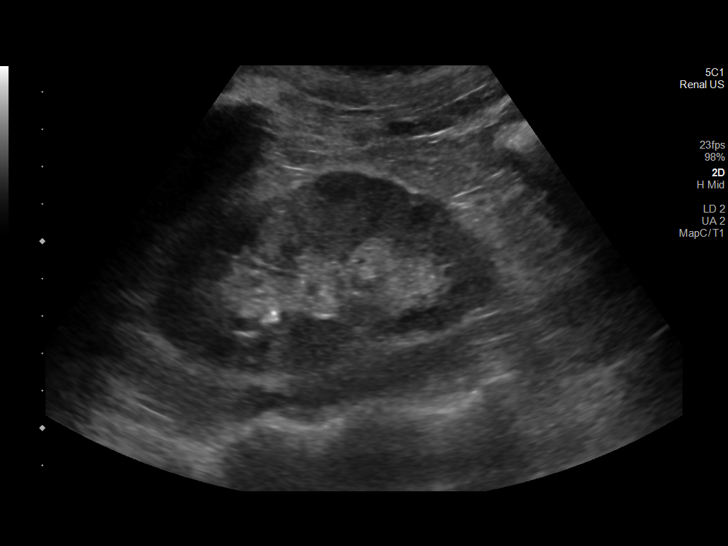
[im 24/39]
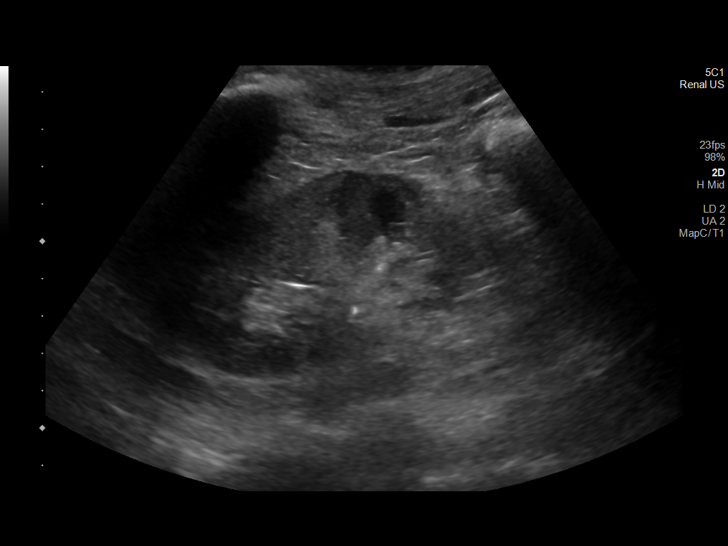
[im 26/39]
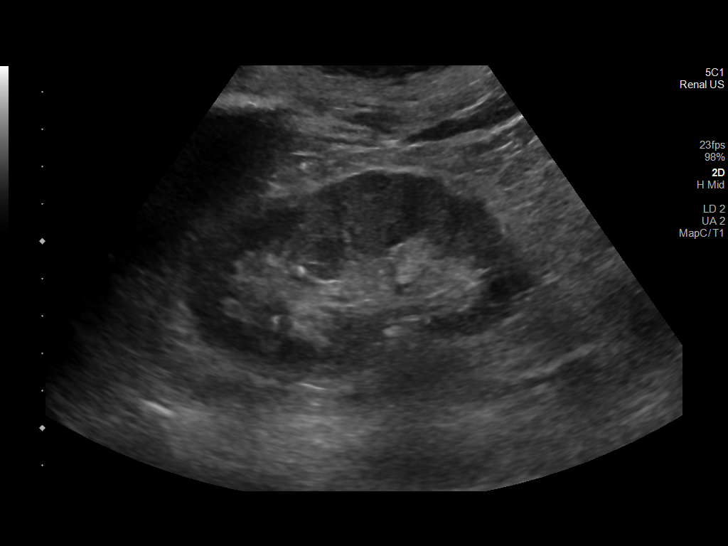
[im 29/39]
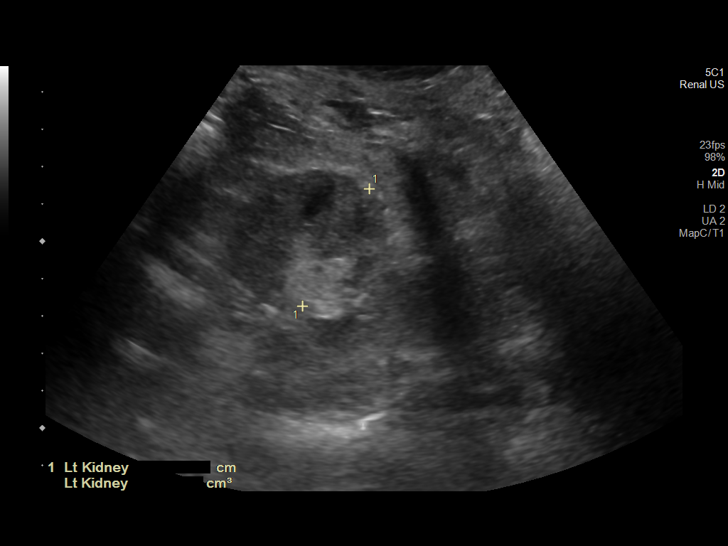
[im 32/39]
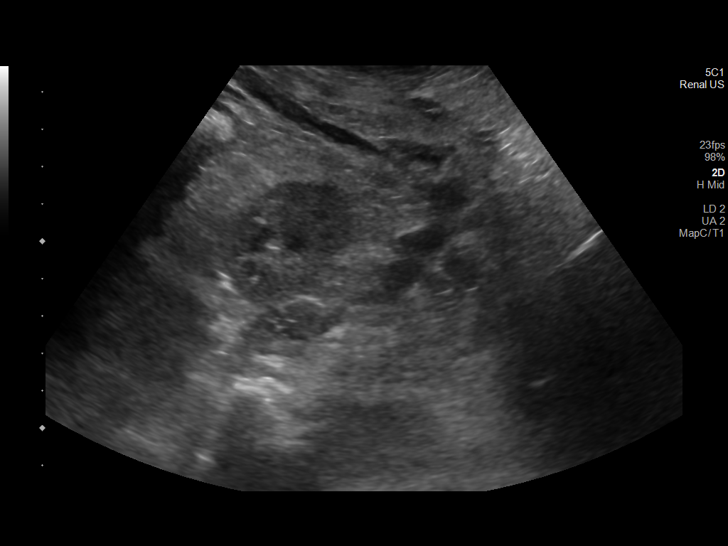
[im 35/39]
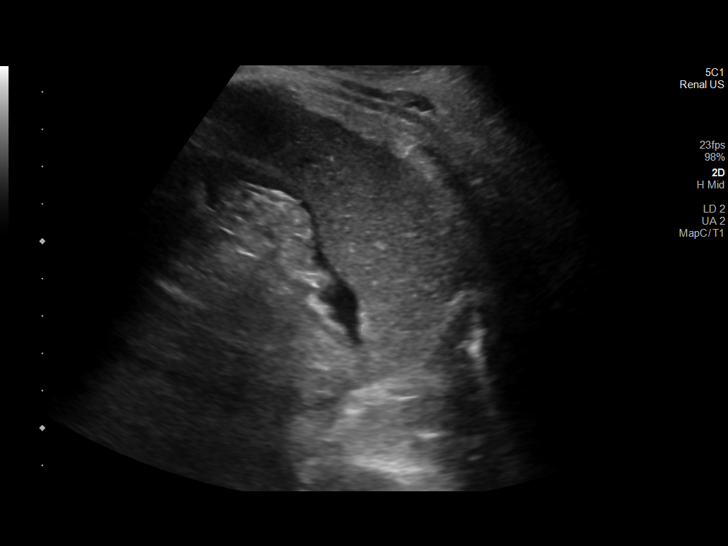
[im 39/39]
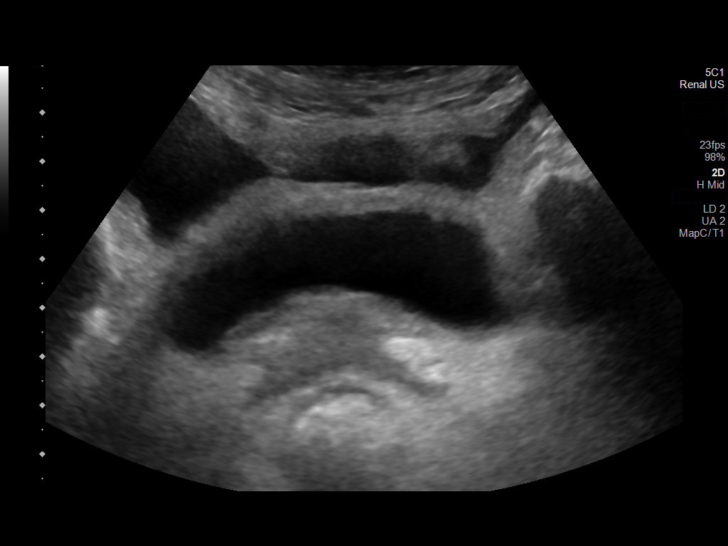

[14 of 25 positions shown; findings below may reference images not displayed]

FINDINGS: Right Kidney:

Renal measurements: 8.8 x 3.7 x 3.8 cm = volume: 64 mL. There is
diffuse increased renal parenchymal echogenicity. No hydronephrosis
or shadowing stone.

Left Kidney:

Renal measurements: 9.3 x 4.6 x 3.6 cm = volume: 81 mL. Diffuse
increased renal parenchymal echogenicity. No hydronephrosis or
shadowing stone.

Bladder:

Appears normal for degree of bladder distention.

Other:

Small ascites.
IMPRESSION: 1. Echogenic kidneys may represent medical renal disease. Clinical
correlation is recommended. No hydronephrosis or shadowing stone.
2. Small ascites.

## 2022-05-20 IMAGING — XA IR FLUORO GUIDE CV LINE*R*
1 series · 2 of 2 positions shown · non-contrast
Comparison: none

INDICATION: Progressive renal failure

[Series 300: ir tunneled central venous catheter plac · portal-venous · 2 of 2 slices shown]
[im 1/2]
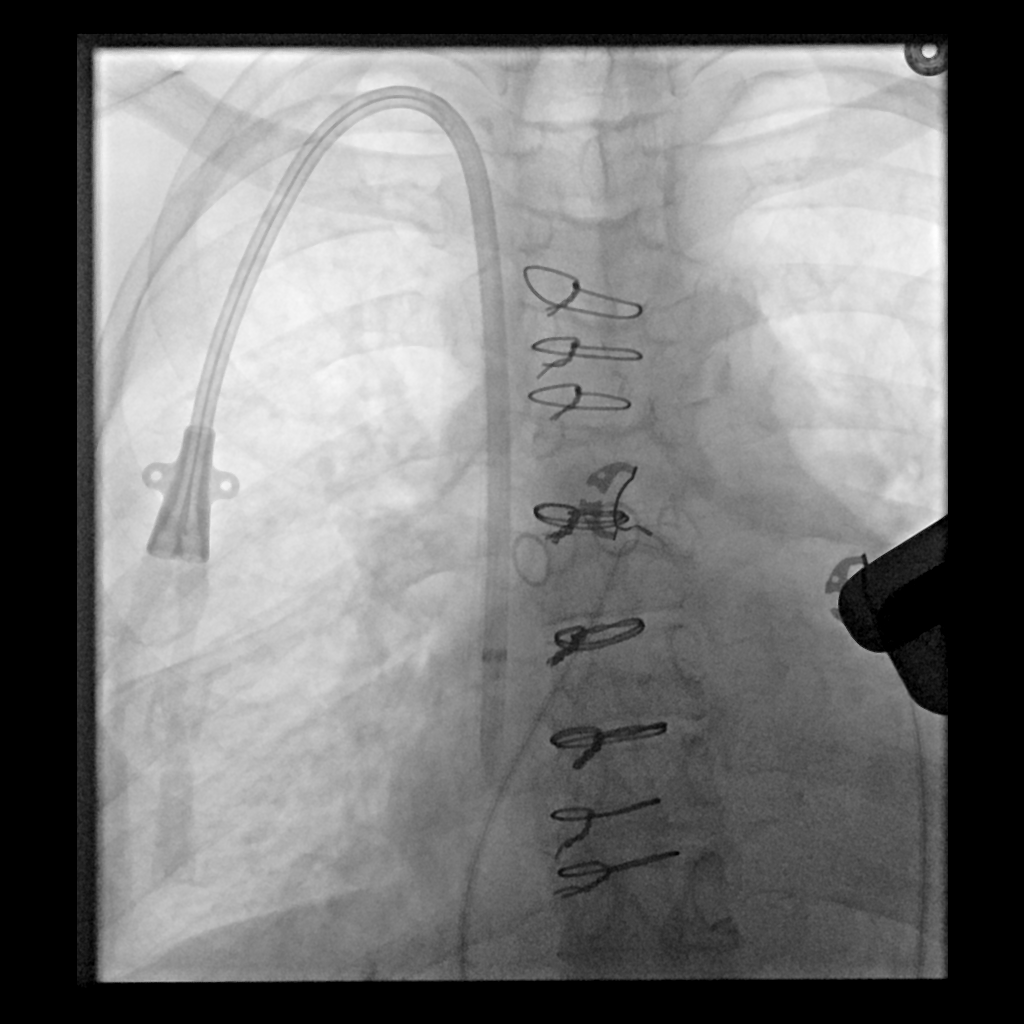
[im 2/2]
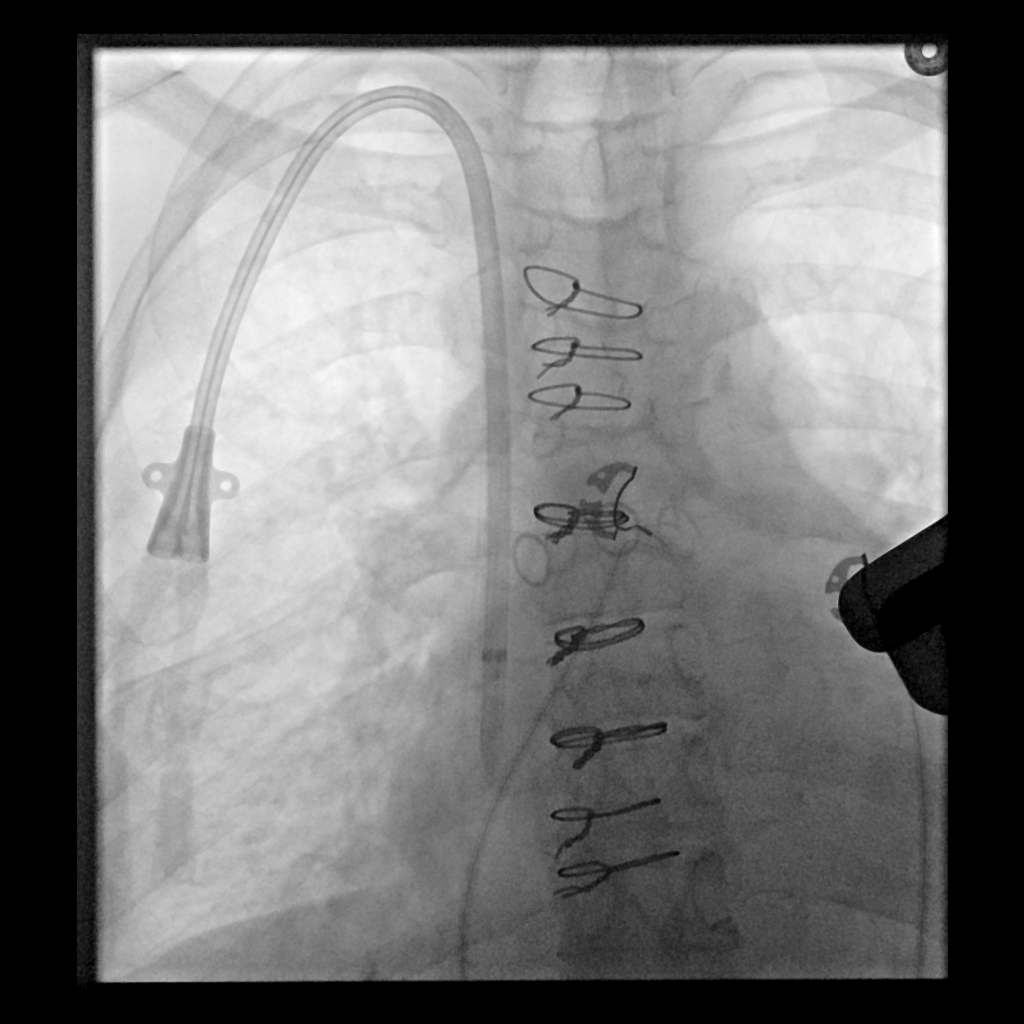

[2 of 2 positions shown; findings below may reference images not displayed]

EXAM:
Ultrasound fluoroscopy guided tunneled dialysis catheter placement

MEDICATIONS:
Ancef 2 g IV; The antibiotic was administered within an appropriate
time interval prior to skin puncture.

ANESTHESIA/SEDATION:
Moderate (conscious) sedation was employed during this procedure. A
total of Versed 0.5 mg was administered intravenously.

Moderate Sedation Time: 12 minutes. The patient's level of
consciousness and vital signs were monitored continuously by
radiology nursing throughout the procedure under my direct
supervision.

FLUOROSCOPY TIME:  Fluoroscopy Time: 1.1 minutes (2.9 mGy).

COMPLICATIONS:
None immediate.

PROCEDURE:
Informed written consent was obtained from the patient after a
thorough discussion of the procedural risks, benefits and
alternatives. All questions were addressed. Maximal Sterile Barrier
Technique was utilized including caps, mask, sterile gowns, sterile
gloves, sterile drape, hand hygiene and skin antiseptic. A timeout
was performed prior to the initiation of the procedure.

The right internal jugular vein was evaluated with ultrasound and
shown to be patent. A permanent ultrasound image was obtained and
placed in the patient's medical record. Using sterile gel and a
sterile probe cover, the right internal jugular vein was entered
with a 21 ga needle during real time ultrasound guidance.

0.018 inch guidewire placed and 21 ga needle exchanged for
transitional dilator set. Utilizing fluoroscopy, 0.035 inch
guidewire advanced through the needle without difficulty.

Seriel dilation was performed and peel-away sheath was placed.
Attention then turned to the right anterior upper chest. Following
local lidocaine administration, the hemodialysis catheter was
tunneled from the chest wall to the venotomy site. The catheter was
inserted through the peel-away sheath. The tip of the catheter was
positioned within the right atrium using fluoroscopic guidance.

All lumens of the catheter aspirated and flushed well. The dialysis
lumens were locked with Heparin.

The catheter was secured to the skin with suture. The insertion site
was covered with a Biopatch and sterile dressing.
IMPRESSION: Tunneled right IJ hemodialysis catheter ready for use.

## 2022-05-22 IMAGING — DX DG CHEST 1V PORT
1 series · 1 of 1 positions shown · non-contrast
Comparison: 05/10/2021

CLINICAL DATA: Shortness of breath

EXAM:
PORTABLE CHEST 1 VIEW

[chest ap]
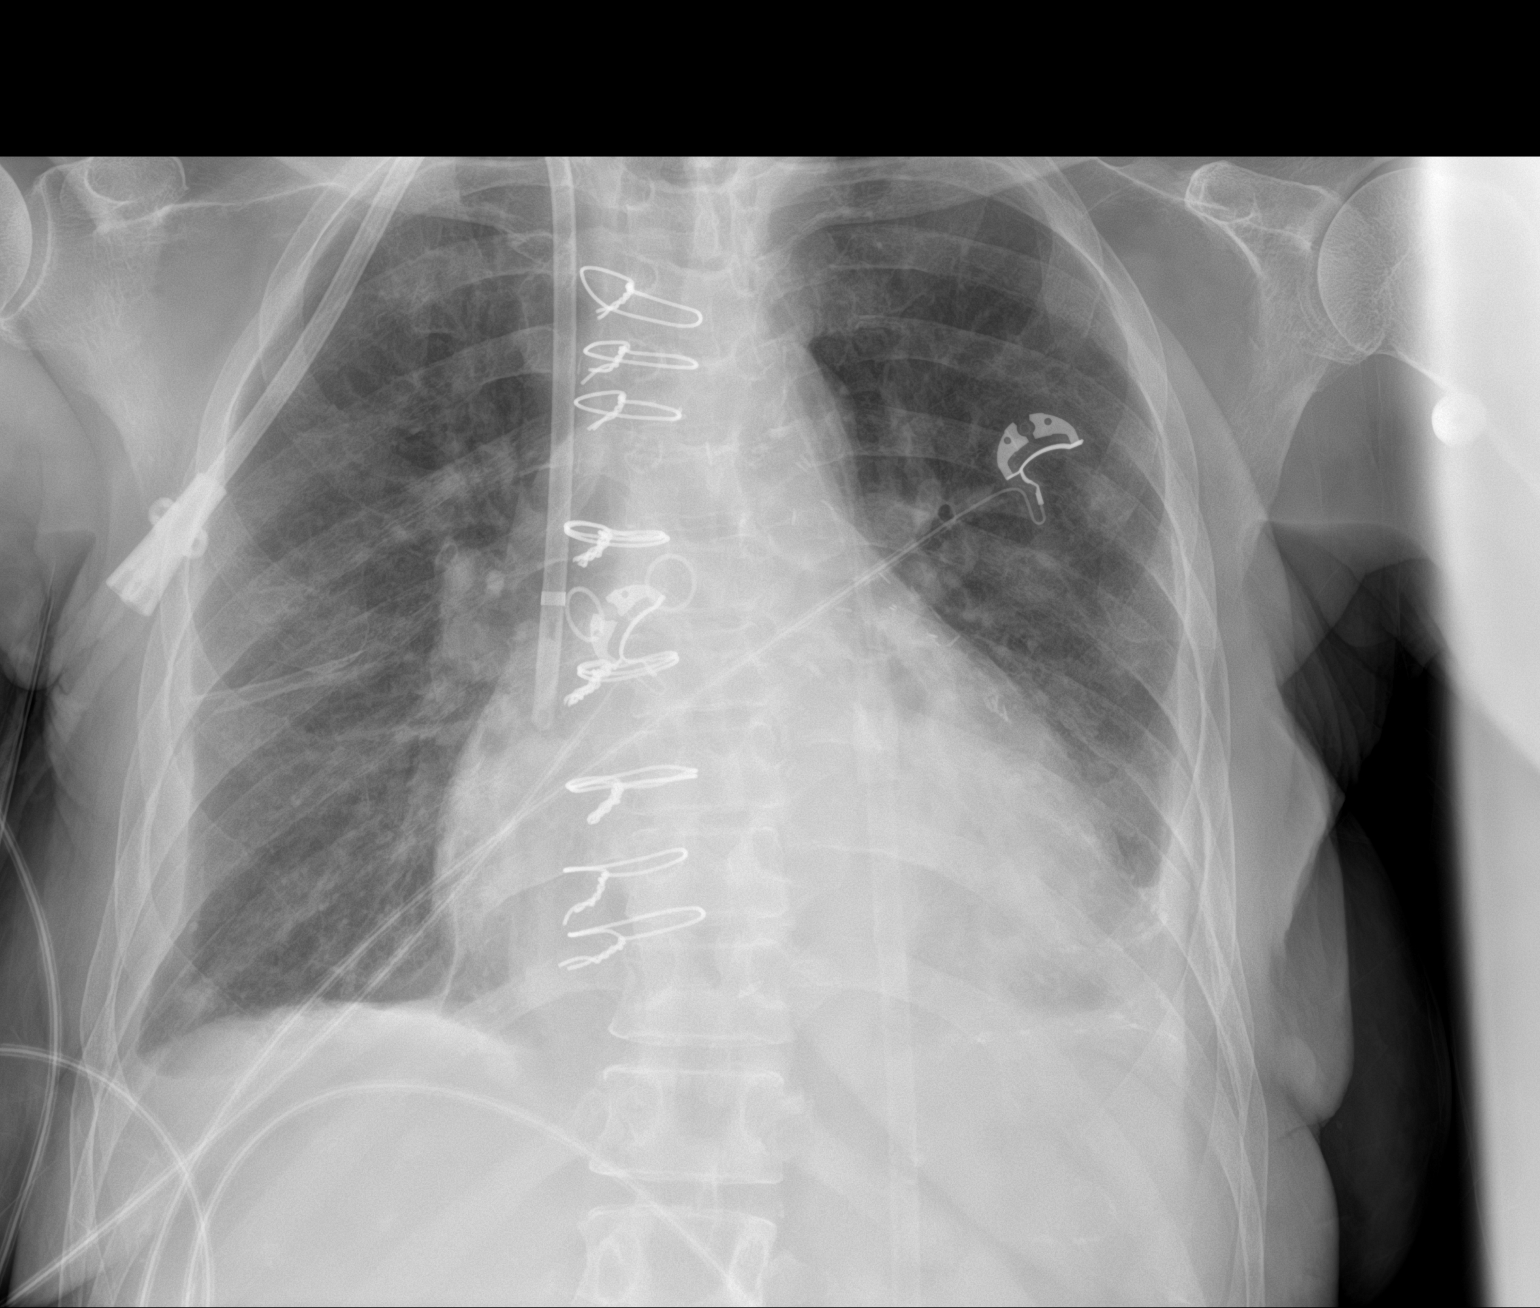

[1 of 1 positions shown; findings below may reference images not displayed]

FINDINGS: Cardiac shadow remains enlarged. Postsurgical changes are noted. New
right-sided jugular dialysis catheter is seen in satisfactory
position. Chronic blunting of the costophrenic angles is again noted
and stable. Mild central vascular congestion is seen. No acute
infiltrate is noted. No bony abnormality is seen.
IMPRESSION: Mild vascular congestion.

Chronic blunting of the costophrenic angle stable from previous
exam.
